# Patient Record
Sex: Female | Born: 1945
Health system: Southern US, Community
[De-identification: ages and names within clinical notes are randomized; demographics above are authoritative.]

## PROBLEM LIST (undated history)

## (undated) DIAGNOSIS — M1990 Unspecified osteoarthritis, unspecified site: Secondary | ICD-10-CM

## (undated) DIAGNOSIS — R011 Cardiac murmur, unspecified: Secondary | ICD-10-CM

## (undated) DIAGNOSIS — E039 Hypothyroidism, unspecified: Secondary | ICD-10-CM

## (undated) DIAGNOSIS — B019 Varicella without complication: Secondary | ICD-10-CM

## (undated) DIAGNOSIS — Z923 Personal history of irradiation: Secondary | ICD-10-CM

## (undated) DIAGNOSIS — E785 Hyperlipidemia, unspecified: Secondary | ICD-10-CM

## (undated) DIAGNOSIS — M8580 Other specified disorders of bone density and structure, unspecified site: Secondary | ICD-10-CM

## (undated) DIAGNOSIS — I10 Essential (primary) hypertension: Secondary | ICD-10-CM

## (undated) DIAGNOSIS — K5790 Diverticulosis of intestine, part unspecified, without perforation or abscess without bleeding: Secondary | ICD-10-CM

## (undated) DIAGNOSIS — R32 Unspecified urinary incontinence: Secondary | ICD-10-CM

## (undated) DIAGNOSIS — J309 Allergic rhinitis, unspecified: Secondary | ICD-10-CM

## (undated) DIAGNOSIS — N189 Chronic kidney disease, unspecified: Secondary | ICD-10-CM

## (undated) DIAGNOSIS — K219 Gastro-esophageal reflux disease without esophagitis: Secondary | ICD-10-CM

## (undated) DIAGNOSIS — E042 Nontoxic multinodular goiter: Secondary | ICD-10-CM

## (undated) DIAGNOSIS — E559 Vitamin D deficiency, unspecified: Secondary | ICD-10-CM

## (undated) DIAGNOSIS — K635 Polyp of colon: Secondary | ICD-10-CM

## (undated) DIAGNOSIS — F419 Anxiety disorder, unspecified: Secondary | ICD-10-CM

## (undated) DIAGNOSIS — N390 Urinary tract infection, site not specified: Secondary | ICD-10-CM

## (undated) DIAGNOSIS — C50919 Malignant neoplasm of unspecified site of unspecified female breast: Secondary | ICD-10-CM

## (undated) DIAGNOSIS — K921 Melena: Secondary | ICD-10-CM

## (undated) HISTORY — DX: Essential (primary) hypertension: I10

## (undated) HISTORY — DX: Cardiac murmur, unspecified: R01.1

## (undated) HISTORY — DX: Malignant neoplasm of unspecified site of unspecified female breast: C50.919

## (undated) HISTORY — DX: Hyperlipidemia, unspecified: E78.5

## (undated) HISTORY — DX: Unspecified osteoarthritis, unspecified site: M19.90

## (undated) HISTORY — DX: Varicella without complication: B01.9

## (undated) HISTORY — DX: Unspecified urinary incontinence: R32

## (undated) HISTORY — DX: Urinary tract infection, site not specified: N39.0

## (undated) HISTORY — DX: Polyp of colon: K63.5

## (undated) HISTORY — DX: Nontoxic multinodular goiter: E04.2

## (undated) HISTORY — PX: BLADDER SURGERY: SHX569

## (undated) HISTORY — DX: Chronic kidney disease, unspecified: N18.9

## (undated) HISTORY — DX: Allergic rhinitis, unspecified: J30.9

## (undated) HISTORY — DX: Gastro-esophageal reflux disease without esophagitis: K21.9

## (undated) HISTORY — DX: Melena: K92.1

## (undated) HISTORY — DX: Diverticulosis of intestine, part unspecified, without perforation or abscess without bleeding: K57.90

---

## 2001-08-15 HISTORY — PX: OTHER SURGICAL HISTORY: SHX169

## 2004-08-15 DIAGNOSIS — C50919 Malignant neoplasm of unspecified site of unspecified female breast: Secondary | ICD-10-CM

## 2004-08-15 HISTORY — PX: BREAST LUMPECTOMY: SHX2

## 2004-08-15 HISTORY — PX: BREAST EXCISIONAL BIOPSY: SUR124

## 2004-08-15 HISTORY — DX: Malignant neoplasm of unspecified site of unspecified female breast: C50.919

## 2007-04-02 ENCOUNTER — Emergency Department: Payer: Self-pay | Admitting: Emergency Medicine

## 2007-08-16 HISTORY — PX: OTHER SURGICAL HISTORY: SHX169

## 2009-09-22 ENCOUNTER — Emergency Department: Payer: Self-pay | Admitting: Emergency Medicine

## 2010-08-15 HISTORY — PX: DILATION AND CURETTAGE OF UTERUS: SHX78

## 2011-07-13 ENCOUNTER — Ambulatory Visit: Payer: Self-pay | Admitting: Family Medicine

## 2011-09-06 DIAGNOSIS — IMO0002 Reserved for concepts with insufficient information to code with codable children: Secondary | ICD-10-CM | POA: Diagnosis not present

## 2011-09-06 DIAGNOSIS — M25669 Stiffness of unspecified knee, not elsewhere classified: Secondary | ICD-10-CM | POA: Diagnosis not present

## 2011-09-06 DIAGNOSIS — M6580 Other synovitis and tenosynovitis, unspecified site: Secondary | ICD-10-CM | POA: Diagnosis not present

## 2011-09-06 DIAGNOSIS — M1710 Unilateral primary osteoarthritis, unspecified knee: Secondary | ICD-10-CM | POA: Diagnosis not present

## 2011-09-06 DIAGNOSIS — M25569 Pain in unspecified knee: Secondary | ICD-10-CM | POA: Diagnosis not present

## 2011-09-13 DIAGNOSIS — M25669 Stiffness of unspecified knee, not elsewhere classified: Secondary | ICD-10-CM | POA: Diagnosis not present

## 2011-09-13 DIAGNOSIS — M25569 Pain in unspecified knee: Secondary | ICD-10-CM | POA: Diagnosis not present

## 2011-09-13 DIAGNOSIS — M6281 Muscle weakness (generalized): Secondary | ICD-10-CM | POA: Diagnosis not present

## 2011-09-16 DIAGNOSIS — M6281 Muscle weakness (generalized): Secondary | ICD-10-CM | POA: Diagnosis not present

## 2011-09-16 DIAGNOSIS — M25569 Pain in unspecified knee: Secondary | ICD-10-CM | POA: Diagnosis not present

## 2011-09-16 DIAGNOSIS — M25669 Stiffness of unspecified knee, not elsewhere classified: Secondary | ICD-10-CM | POA: Diagnosis not present

## 2011-09-27 DIAGNOSIS — M6281 Muscle weakness (generalized): Secondary | ICD-10-CM | POA: Diagnosis not present

## 2011-09-27 DIAGNOSIS — M25669 Stiffness of unspecified knee, not elsewhere classified: Secondary | ICD-10-CM | POA: Diagnosis not present

## 2011-09-27 DIAGNOSIS — M25569 Pain in unspecified knee: Secondary | ICD-10-CM | POA: Diagnosis not present

## 2011-09-29 DIAGNOSIS — M25669 Stiffness of unspecified knee, not elsewhere classified: Secondary | ICD-10-CM | POA: Diagnosis not present

## 2011-09-29 DIAGNOSIS — M6281 Muscle weakness (generalized): Secondary | ICD-10-CM | POA: Diagnosis not present

## 2011-09-29 DIAGNOSIS — M25569 Pain in unspecified knee: Secondary | ICD-10-CM | POA: Diagnosis not present

## 2011-10-04 DIAGNOSIS — M25669 Stiffness of unspecified knee, not elsewhere classified: Secondary | ICD-10-CM | POA: Diagnosis not present

## 2011-10-04 DIAGNOSIS — M6281 Muscle weakness (generalized): Secondary | ICD-10-CM | POA: Diagnosis not present

## 2011-10-04 DIAGNOSIS — M25569 Pain in unspecified knee: Secondary | ICD-10-CM | POA: Diagnosis not present

## 2011-10-06 DIAGNOSIS — M25569 Pain in unspecified knee: Secondary | ICD-10-CM | POA: Diagnosis not present

## 2011-10-06 DIAGNOSIS — M25669 Stiffness of unspecified knee, not elsewhere classified: Secondary | ICD-10-CM | POA: Diagnosis not present

## 2011-10-06 DIAGNOSIS — M6281 Muscle weakness (generalized): Secondary | ICD-10-CM | POA: Diagnosis not present

## 2011-10-11 DIAGNOSIS — M1710 Unilateral primary osteoarthritis, unspecified knee: Secondary | ICD-10-CM | POA: Diagnosis not present

## 2011-10-11 DIAGNOSIS — M238X9 Other internal derangements of unspecified knee: Secondary | ICD-10-CM | POA: Diagnosis not present

## 2011-10-11 DIAGNOSIS — IMO0002 Reserved for concepts with insufficient information to code with codable children: Secondary | ICD-10-CM | POA: Diagnosis not present

## 2011-10-11 DIAGNOSIS — M25669 Stiffness of unspecified knee, not elsewhere classified: Secondary | ICD-10-CM | POA: Diagnosis not present

## 2011-10-11 DIAGNOSIS — M25569 Pain in unspecified knee: Secondary | ICD-10-CM | POA: Diagnosis not present

## 2011-11-22 DIAGNOSIS — Z853 Personal history of malignant neoplasm of breast: Secondary | ICD-10-CM | POA: Diagnosis not present

## 2011-11-22 DIAGNOSIS — M2550 Pain in unspecified joint: Secondary | ICD-10-CM | POA: Diagnosis not present

## 2011-11-22 DIAGNOSIS — E559 Vitamin D deficiency, unspecified: Secondary | ICD-10-CM | POA: Diagnosis not present

## 2011-11-22 DIAGNOSIS — R252 Cramp and spasm: Secondary | ICD-10-CM | POA: Diagnosis not present

## 2011-11-22 DIAGNOSIS — E785 Hyperlipidemia, unspecified: Secondary | ICD-10-CM | POA: Diagnosis not present

## 2011-11-22 DIAGNOSIS — Z8249 Family history of ischemic heart disease and other diseases of the circulatory system: Secondary | ICD-10-CM | POA: Diagnosis not present

## 2012-01-11 DIAGNOSIS — M79609 Pain in unspecified limb: Secondary | ICD-10-CM | POA: Diagnosis not present

## 2012-05-24 DIAGNOSIS — C50919 Malignant neoplasm of unspecified site of unspecified female breast: Secondary | ICD-10-CM | POA: Diagnosis not present

## 2012-05-24 DIAGNOSIS — E559 Vitamin D deficiency, unspecified: Secondary | ICD-10-CM | POA: Diagnosis not present

## 2012-05-24 DIAGNOSIS — R079 Chest pain, unspecified: Secondary | ICD-10-CM | POA: Diagnosis not present

## 2012-05-24 DIAGNOSIS — E0590 Thyrotoxicosis, unspecified without thyrotoxic crisis or storm: Secondary | ICD-10-CM | POA: Diagnosis not present

## 2012-05-24 DIAGNOSIS — R109 Unspecified abdominal pain: Secondary | ICD-10-CM | POA: Diagnosis not present

## 2012-05-25 DIAGNOSIS — R109 Unspecified abdominal pain: Secondary | ICD-10-CM | POA: Diagnosis not present

## 2012-05-25 DIAGNOSIS — E559 Vitamin D deficiency, unspecified: Secondary | ICD-10-CM | POA: Diagnosis not present

## 2012-05-25 DIAGNOSIS — R079 Chest pain, unspecified: Secondary | ICD-10-CM | POA: Diagnosis not present

## 2012-05-25 DIAGNOSIS — E785 Hyperlipidemia, unspecified: Secondary | ICD-10-CM | POA: Diagnosis not present

## 2012-05-28 ENCOUNTER — Ambulatory Visit: Payer: Self-pay | Admitting: Family Medicine

## 2012-05-28 DIAGNOSIS — R1013 Epigastric pain: Secondary | ICD-10-CM | POA: Diagnosis not present

## 2012-05-28 DIAGNOSIS — R002 Palpitations: Secondary | ICD-10-CM | POA: Diagnosis not present

## 2012-05-28 DIAGNOSIS — R109 Unspecified abdominal pain: Secondary | ICD-10-CM | POA: Diagnosis not present

## 2012-05-28 DIAGNOSIS — R0989 Other specified symptoms and signs involving the circulatory and respiratory systems: Secondary | ICD-10-CM | POA: Diagnosis not present

## 2012-05-28 DIAGNOSIS — R0789 Other chest pain: Secondary | ICD-10-CM | POA: Diagnosis not present

## 2012-05-28 DIAGNOSIS — R0609 Other forms of dyspnea: Secondary | ICD-10-CM | POA: Diagnosis not present

## 2012-05-28 DIAGNOSIS — I10 Essential (primary) hypertension: Secondary | ICD-10-CM | POA: Diagnosis not present

## 2012-06-04 DIAGNOSIS — M79609 Pain in unspecified limb: Secondary | ICD-10-CM | POA: Diagnosis not present

## 2012-06-04 DIAGNOSIS — B351 Tinea unguium: Secondary | ICD-10-CM | POA: Diagnosis not present

## 2012-06-05 DIAGNOSIS — R002 Palpitations: Secondary | ICD-10-CM | POA: Diagnosis not present

## 2012-06-05 DIAGNOSIS — R0989 Other specified symptoms and signs involving the circulatory and respiratory systems: Secondary | ICD-10-CM | POA: Diagnosis not present

## 2012-06-05 DIAGNOSIS — R0609 Other forms of dyspnea: Secondary | ICD-10-CM | POA: Diagnosis not present

## 2012-06-05 DIAGNOSIS — R0789 Other chest pain: Secondary | ICD-10-CM | POA: Diagnosis not present

## 2012-06-05 DIAGNOSIS — I10 Essential (primary) hypertension: Secondary | ICD-10-CM | POA: Diagnosis not present

## 2012-06-06 DIAGNOSIS — Z0000 Encounter for general adult medical examination without abnormal findings: Secondary | ICD-10-CM | POA: Diagnosis not present

## 2012-06-06 DIAGNOSIS — R6889 Other general symptoms and signs: Secondary | ICD-10-CM | POA: Diagnosis not present

## 2012-06-07 ENCOUNTER — Ambulatory Visit: Payer: Self-pay | Admitting: Hematology and Oncology

## 2012-06-07 DIAGNOSIS — Z9223 Personal history of estrogen therapy: Secondary | ICD-10-CM | POA: Diagnosis not present

## 2012-06-07 DIAGNOSIS — Z7982 Long term (current) use of aspirin: Secondary | ICD-10-CM | POA: Diagnosis not present

## 2012-06-07 DIAGNOSIS — G609 Hereditary and idiopathic neuropathy, unspecified: Secondary | ICD-10-CM | POA: Diagnosis not present

## 2012-06-07 DIAGNOSIS — Z79899 Other long term (current) drug therapy: Secondary | ICD-10-CM | POA: Diagnosis not present

## 2012-06-07 DIAGNOSIS — Z853 Personal history of malignant neoplasm of breast: Secondary | ICD-10-CM | POA: Diagnosis not present

## 2012-06-07 DIAGNOSIS — Z923 Personal history of irradiation: Secondary | ICD-10-CM | POA: Diagnosis not present

## 2012-06-08 DIAGNOSIS — R0602 Shortness of breath: Secondary | ICD-10-CM | POA: Diagnosis not present

## 2012-06-08 DIAGNOSIS — R0789 Other chest pain: Secondary | ICD-10-CM | POA: Diagnosis not present

## 2012-06-08 DIAGNOSIS — R011 Cardiac murmur, unspecified: Secondary | ICD-10-CM | POA: Diagnosis not present

## 2012-06-14 DIAGNOSIS — R0789 Other chest pain: Secondary | ICD-10-CM | POA: Diagnosis not present

## 2012-06-14 DIAGNOSIS — R0609 Other forms of dyspnea: Secondary | ICD-10-CM | POA: Diagnosis not present

## 2012-06-14 DIAGNOSIS — R0989 Other specified symptoms and signs involving the circulatory and respiratory systems: Secondary | ICD-10-CM | POA: Diagnosis not present

## 2012-06-14 DIAGNOSIS — I10 Essential (primary) hypertension: Secondary | ICD-10-CM | POA: Diagnosis not present

## 2012-06-14 DIAGNOSIS — R002 Palpitations: Secondary | ICD-10-CM | POA: Diagnosis not present

## 2012-06-15 ENCOUNTER — Ambulatory Visit: Payer: Self-pay | Admitting: Hematology and Oncology

## 2012-06-18 ENCOUNTER — Ambulatory Visit: Payer: Self-pay | Admitting: Hematology and Oncology

## 2012-06-18 DIAGNOSIS — Z853 Personal history of malignant neoplasm of breast: Secondary | ICD-10-CM | POA: Diagnosis not present

## 2012-07-06 ENCOUNTER — Ambulatory Visit: Payer: Self-pay | Admitting: Family Medicine

## 2012-07-06 DIAGNOSIS — R109 Unspecified abdominal pain: Secondary | ICD-10-CM | POA: Diagnosis not present

## 2012-07-06 DIAGNOSIS — R079 Chest pain, unspecified: Secondary | ICD-10-CM | POA: Diagnosis not present

## 2012-07-06 DIAGNOSIS — R937 Abnormal findings on diagnostic imaging of other parts of musculoskeletal system: Secondary | ICD-10-CM | POA: Diagnosis not present

## 2012-07-06 DIAGNOSIS — M25579 Pain in unspecified ankle and joints of unspecified foot: Secondary | ICD-10-CM | POA: Diagnosis not present

## 2012-07-06 DIAGNOSIS — I10 Essential (primary) hypertension: Secondary | ICD-10-CM | POA: Diagnosis not present

## 2012-07-09 DIAGNOSIS — I10 Essential (primary) hypertension: Secondary | ICD-10-CM | POA: Diagnosis not present

## 2012-07-09 DIAGNOSIS — R0789 Other chest pain: Secondary | ICD-10-CM | POA: Diagnosis not present

## 2012-07-30 DIAGNOSIS — S92009A Unspecified fracture of unspecified calcaneus, initial encounter for closed fracture: Secondary | ICD-10-CM | POA: Diagnosis not present

## 2012-08-27 DIAGNOSIS — S92009A Unspecified fracture of unspecified calcaneus, initial encounter for closed fracture: Secondary | ICD-10-CM | POA: Diagnosis not present

## 2012-09-04 DIAGNOSIS — R1013 Epigastric pain: Secondary | ICD-10-CM | POA: Diagnosis not present

## 2012-09-04 DIAGNOSIS — K3189 Other diseases of stomach and duodenum: Secondary | ICD-10-CM | POA: Diagnosis not present

## 2012-09-05 DIAGNOSIS — R1013 Epigastric pain: Secondary | ICD-10-CM | POA: Diagnosis not present

## 2012-11-26 ENCOUNTER — Ambulatory Visit: Payer: Self-pay | Admitting: Hematology and Oncology

## 2012-11-26 DIAGNOSIS — Z79899 Other long term (current) drug therapy: Secondary | ICD-10-CM | POA: Diagnosis not present

## 2012-11-26 DIAGNOSIS — Z87891 Personal history of nicotine dependence: Secondary | ICD-10-CM | POA: Diagnosis not present

## 2012-11-26 DIAGNOSIS — R252 Cramp and spasm: Secondary | ICD-10-CM | POA: Diagnosis not present

## 2012-11-26 DIAGNOSIS — Z170 Estrogen receptor positive status [ER+]: Secondary | ICD-10-CM | POA: Diagnosis not present

## 2012-11-26 DIAGNOSIS — Z7982 Long term (current) use of aspirin: Secondary | ICD-10-CM | POA: Diagnosis not present

## 2012-11-26 DIAGNOSIS — Z853 Personal history of malignant neoplasm of breast: Secondary | ICD-10-CM | POA: Diagnosis not present

## 2012-11-26 DIAGNOSIS — Z9223 Personal history of estrogen therapy: Secondary | ICD-10-CM | POA: Diagnosis not present

## 2012-11-26 DIAGNOSIS — M810 Age-related osteoporosis without current pathological fracture: Secondary | ICD-10-CM | POA: Diagnosis not present

## 2012-11-26 DIAGNOSIS — E0590 Thyrotoxicosis, unspecified without thyrotoxic crisis or storm: Secondary | ICD-10-CM | POA: Diagnosis not present

## 2012-11-26 DIAGNOSIS — Z13820 Encounter for screening for osteoporosis: Secondary | ICD-10-CM | POA: Diagnosis not present

## 2012-11-26 DIAGNOSIS — Z923 Personal history of irradiation: Secondary | ICD-10-CM | POA: Diagnosis not present

## 2012-11-26 DIAGNOSIS — E559 Vitamin D deficiency, unspecified: Secondary | ICD-10-CM | POA: Diagnosis not present

## 2012-11-28 DIAGNOSIS — Z923 Personal history of irradiation: Secondary | ICD-10-CM | POA: Diagnosis not present

## 2012-11-28 DIAGNOSIS — Z853 Personal history of malignant neoplasm of breast: Secondary | ICD-10-CM | POA: Diagnosis not present

## 2012-11-28 DIAGNOSIS — E559 Vitamin D deficiency, unspecified: Secondary | ICD-10-CM | POA: Diagnosis not present

## 2012-11-28 DIAGNOSIS — Z9223 Personal history of estrogen therapy: Secondary | ICD-10-CM | POA: Diagnosis not present

## 2012-11-28 LAB — CBC CANCER CENTER
Basophil %: 0.2 %
Eosinophil #: 0.1 x10 3/mm (ref 0.0–0.7)
Lymphocyte #: 1.1 x10 3/mm (ref 1.0–3.6)
Lymphocyte %: 31.6 %
MCH: 27.1 pg (ref 26.0–34.0)
Monocyte %: 13.9 %
Neutrophil #: 1.9 x10 3/mm (ref 1.4–6.5)
RBC: 4.68 10*6/uL (ref 3.80–5.20)
RDW: 15.1 % — ABNORMAL HIGH (ref 11.5–14.5)

## 2012-11-28 LAB — COMPREHENSIVE METABOLIC PANEL
Alkaline Phosphatase: 85 U/L (ref 50–136)
BUN: 15 mg/dL (ref 7–18)
Calcium, Total: 9.5 mg/dL (ref 8.5–10.1)
Co2: 32 mmol/L (ref 21–32)
Creatinine: 1.36 mg/dL — ABNORMAL HIGH (ref 0.60–1.30)
EGFR (African American): 46 — ABNORMAL LOW
EGFR (Non-African Amer.): 40 — ABNORMAL LOW
Glucose: 102 mg/dL — ABNORMAL HIGH (ref 65–99)
Potassium: 4.4 mmol/L (ref 3.5–5.1)
SGPT (ALT): 25 U/L (ref 12–78)
Total Protein: 7.7 g/dL (ref 6.4–8.2)

## 2012-11-29 DIAGNOSIS — E785 Hyperlipidemia, unspecified: Secondary | ICD-10-CM | POA: Diagnosis not present

## 2012-11-29 DIAGNOSIS — R5381 Other malaise: Secondary | ICD-10-CM | POA: Diagnosis not present

## 2012-11-29 DIAGNOSIS — E559 Vitamin D deficiency, unspecified: Secondary | ICD-10-CM | POA: Diagnosis not present

## 2012-12-03 DIAGNOSIS — I10 Essential (primary) hypertension: Secondary | ICD-10-CM | POA: Diagnosis not present

## 2012-12-03 DIAGNOSIS — E559 Vitamin D deficiency, unspecified: Secondary | ICD-10-CM | POA: Diagnosis not present

## 2012-12-10 DIAGNOSIS — H6120 Impacted cerumen, unspecified ear: Secondary | ICD-10-CM | POA: Diagnosis not present

## 2012-12-10 DIAGNOSIS — H60509 Unspecified acute noninfective otitis externa, unspecified ear: Secondary | ICD-10-CM | POA: Diagnosis not present

## 2012-12-11 DIAGNOSIS — M810 Age-related osteoporosis without current pathological fracture: Secondary | ICD-10-CM | POA: Diagnosis not present

## 2012-12-12 DIAGNOSIS — R0789 Other chest pain: Secondary | ICD-10-CM | POA: Diagnosis not present

## 2012-12-12 DIAGNOSIS — I10 Essential (primary) hypertension: Secondary | ICD-10-CM | POA: Diagnosis not present

## 2012-12-12 DIAGNOSIS — R0609 Other forms of dyspnea: Secondary | ICD-10-CM | POA: Diagnosis not present

## 2012-12-13 ENCOUNTER — Ambulatory Visit: Payer: Self-pay | Admitting: Hematology and Oncology

## 2012-12-13 DIAGNOSIS — Z87891 Personal history of nicotine dependence: Secondary | ICD-10-CM | POA: Diagnosis not present

## 2012-12-13 DIAGNOSIS — Z9223 Personal history of estrogen therapy: Secondary | ICD-10-CM | POA: Diagnosis not present

## 2012-12-13 DIAGNOSIS — E559 Vitamin D deficiency, unspecified: Secondary | ICD-10-CM | POA: Diagnosis not present

## 2012-12-13 DIAGNOSIS — Z170 Estrogen receptor positive status [ER+]: Secondary | ICD-10-CM | POA: Diagnosis not present

## 2012-12-13 DIAGNOSIS — Z7982 Long term (current) use of aspirin: Secondary | ICD-10-CM | POA: Diagnosis not present

## 2012-12-13 DIAGNOSIS — M810 Age-related osteoporosis without current pathological fracture: Secondary | ICD-10-CM | POA: Diagnosis not present

## 2012-12-13 DIAGNOSIS — Z853 Personal history of malignant neoplasm of breast: Secondary | ICD-10-CM | POA: Diagnosis not present

## 2012-12-13 DIAGNOSIS — F329 Major depressive disorder, single episode, unspecified: Secondary | ICD-10-CM | POA: Diagnosis not present

## 2012-12-13 DIAGNOSIS — Z862 Personal history of diseases of the blood and blood-forming organs and certain disorders involving the immune mechanism: Secondary | ICD-10-CM | POA: Diagnosis not present

## 2012-12-13 DIAGNOSIS — I10 Essential (primary) hypertension: Secondary | ICD-10-CM | POA: Diagnosis not present

## 2012-12-13 DIAGNOSIS — Z79899 Other long term (current) drug therapy: Secondary | ICD-10-CM | POA: Diagnosis not present

## 2012-12-13 DIAGNOSIS — Z923 Personal history of irradiation: Secondary | ICD-10-CM | POA: Diagnosis not present

## 2012-12-13 DIAGNOSIS — E785 Hyperlipidemia, unspecified: Secondary | ICD-10-CM | POA: Diagnosis not present

## 2012-12-14 DIAGNOSIS — Z170 Estrogen receptor positive status [ER+]: Secondary | ICD-10-CM | POA: Diagnosis not present

## 2012-12-14 DIAGNOSIS — Z853 Personal history of malignant neoplasm of breast: Secondary | ICD-10-CM | POA: Diagnosis not present

## 2012-12-14 DIAGNOSIS — M810 Age-related osteoporosis without current pathological fracture: Secondary | ICD-10-CM | POA: Diagnosis not present

## 2012-12-14 DIAGNOSIS — Z9223 Personal history of estrogen therapy: Secondary | ICD-10-CM | POA: Diagnosis not present

## 2013-01-13 ENCOUNTER — Ambulatory Visit: Payer: Self-pay | Admitting: Hematology and Oncology

## 2013-01-28 DIAGNOSIS — I10 Essential (primary) hypertension: Secondary | ICD-10-CM | POA: Diagnosis not present

## 2013-01-28 DIAGNOSIS — E559 Vitamin D deficiency, unspecified: Secondary | ICD-10-CM | POA: Diagnosis not present

## 2013-03-28 DIAGNOSIS — I10 Essential (primary) hypertension: Secondary | ICD-10-CM | POA: Diagnosis not present

## 2013-03-28 DIAGNOSIS — E785 Hyperlipidemia, unspecified: Secondary | ICD-10-CM | POA: Diagnosis not present

## 2013-03-28 DIAGNOSIS — J309 Allergic rhinitis, unspecified: Secondary | ICD-10-CM | POA: Diagnosis not present

## 2013-03-28 DIAGNOSIS — E559 Vitamin D deficiency, unspecified: Secondary | ICD-10-CM | POA: Diagnosis not present

## 2013-04-04 DIAGNOSIS — L821 Other seborrheic keratosis: Secondary | ICD-10-CM | POA: Diagnosis not present

## 2013-04-04 DIAGNOSIS — D485 Neoplasm of uncertain behavior of skin: Secondary | ICD-10-CM | POA: Diagnosis not present

## 2013-04-04 DIAGNOSIS — L723 Sebaceous cyst: Secondary | ICD-10-CM | POA: Diagnosis not present

## 2013-04-04 DIAGNOSIS — D2360 Other benign neoplasm of skin of unspecified upper limb, including shoulder: Secondary | ICD-10-CM | POA: Diagnosis not present

## 2013-04-04 DIAGNOSIS — R21 Rash and other nonspecific skin eruption: Secondary | ICD-10-CM | POA: Diagnosis not present

## 2013-05-09 DIAGNOSIS — I10 Essential (primary) hypertension: Secondary | ICD-10-CM | POA: Diagnosis not present

## 2013-05-21 DIAGNOSIS — E559 Vitamin D deficiency, unspecified: Secondary | ICD-10-CM | POA: Diagnosis not present

## 2013-05-21 DIAGNOSIS — I10 Essential (primary) hypertension: Secondary | ICD-10-CM | POA: Diagnosis not present

## 2013-05-21 DIAGNOSIS — N182 Chronic kidney disease, stage 2 (mild): Secondary | ICD-10-CM | POA: Diagnosis not present

## 2013-05-30 DIAGNOSIS — R0609 Other forms of dyspnea: Secondary | ICD-10-CM | POA: Diagnosis not present

## 2013-05-30 DIAGNOSIS — R002 Palpitations: Secondary | ICD-10-CM | POA: Diagnosis not present

## 2013-05-30 DIAGNOSIS — G4730 Sleep apnea, unspecified: Secondary | ICD-10-CM | POA: Diagnosis not present

## 2013-06-10 DIAGNOSIS — Z113 Encounter for screening for infections with a predominantly sexual mode of transmission: Secondary | ICD-10-CM | POA: Diagnosis not present

## 2013-06-10 DIAGNOSIS — N949 Unspecified condition associated with female genital organs and menstrual cycle: Secondary | ICD-10-CM | POA: Diagnosis not present

## 2013-06-10 DIAGNOSIS — Z01419 Encounter for gynecological examination (general) (routine) without abnormal findings: Secondary | ICD-10-CM | POA: Diagnosis not present

## 2013-06-10 DIAGNOSIS — Z9189 Other specified personal risk factors, not elsewhere classified: Secondary | ICD-10-CM | POA: Diagnosis not present

## 2013-07-01 ENCOUNTER — Ambulatory Visit: Payer: Self-pay | Admitting: Hematology and Oncology

## 2013-07-01 DIAGNOSIS — Z853 Personal history of malignant neoplasm of breast: Secondary | ICD-10-CM | POA: Diagnosis not present

## 2013-07-01 DIAGNOSIS — Z1231 Encounter for screening mammogram for malignant neoplasm of breast: Secondary | ICD-10-CM | POA: Diagnosis not present

## 2013-07-02 DIAGNOSIS — E559 Vitamin D deficiency, unspecified: Secondary | ICD-10-CM | POA: Diagnosis not present

## 2013-07-04 ENCOUNTER — Ambulatory Visit: Payer: Self-pay | Admitting: Hematology and Oncology

## 2013-07-04 DIAGNOSIS — Z170 Estrogen receptor positive status [ER+]: Secondary | ICD-10-CM | POA: Diagnosis not present

## 2013-07-04 DIAGNOSIS — Z853 Personal history of malignant neoplasm of breast: Secondary | ICD-10-CM | POA: Diagnosis not present

## 2013-07-04 DIAGNOSIS — M810 Age-related osteoporosis without current pathological fracture: Secondary | ICD-10-CM | POA: Diagnosis not present

## 2013-07-04 DIAGNOSIS — Z9223 Personal history of estrogen therapy: Secondary | ICD-10-CM | POA: Diagnosis not present

## 2013-07-04 DIAGNOSIS — Z923 Personal history of irradiation: Secondary | ICD-10-CM | POA: Diagnosis not present

## 2013-07-04 DIAGNOSIS — E039 Hypothyroidism, unspecified: Secondary | ICD-10-CM | POA: Diagnosis not present

## 2013-07-04 DIAGNOSIS — Z79899 Other long term (current) drug therapy: Secondary | ICD-10-CM | POA: Diagnosis not present

## 2013-07-04 LAB — COMPREHENSIVE METABOLIC PANEL
Alkaline Phosphatase: 84 U/L (ref 50–136)
BUN: 15 mg/dL (ref 7–18)
Bilirubin,Total: 0.5 mg/dL (ref 0.2–1.0)
Chloride: 104 mmol/L (ref 98–107)
Creatinine: 1.29 mg/dL (ref 0.60–1.30)
EGFR (African American): 49 — ABNORMAL LOW
Glucose: 87 mg/dL (ref 65–99)
SGPT (ALT): 26 U/L (ref 12–78)
Sodium: 141 mmol/L (ref 136–145)

## 2013-07-04 LAB — CBC CANCER CENTER
Basophil #: 0 x10 3/mm (ref 0.0–0.1)
Basophil %: 1.2 %
Eosinophil #: 0.1 x10 3/mm (ref 0.0–0.7)
HCT: 39.9 % (ref 35.0–47.0)
HGB: 12.6 g/dL (ref 12.0–16.0)
Lymphocyte %: 41.4 %
MCH: 27 pg (ref 26.0–34.0)
MCHC: 31.7 g/dL — ABNORMAL LOW (ref 32.0–36.0)
MCV: 85 fL (ref 80–100)
Monocyte #: 0.4 x10 3/mm (ref 0.2–0.9)
Monocyte %: 13.7 %
Neutrophil #: 1.3 x10 3/mm — ABNORMAL LOW (ref 1.4–6.5)
Neutrophil %: 41.2 %
Platelet: 181 x10 3/mm (ref 150–440)
RDW: 15.2 % — ABNORMAL HIGH (ref 11.5–14.5)

## 2013-07-15 ENCOUNTER — Ambulatory Visit: Payer: Self-pay | Admitting: Hematology and Oncology

## 2013-11-19 DIAGNOSIS — N183 Chronic kidney disease, stage 3 unspecified: Secondary | ICD-10-CM | POA: Diagnosis not present

## 2013-11-19 DIAGNOSIS — E559 Vitamin D deficiency, unspecified: Secondary | ICD-10-CM | POA: Diagnosis not present

## 2013-11-19 DIAGNOSIS — I10 Essential (primary) hypertension: Secondary | ICD-10-CM | POA: Diagnosis not present

## 2013-11-26 DIAGNOSIS — R0609 Other forms of dyspnea: Secondary | ICD-10-CM | POA: Diagnosis not present

## 2013-11-26 DIAGNOSIS — I10 Essential (primary) hypertension: Secondary | ICD-10-CM | POA: Diagnosis not present

## 2014-01-13 DIAGNOSIS — I10 Essential (primary) hypertension: Secondary | ICD-10-CM | POA: Diagnosis not present

## 2014-01-13 DIAGNOSIS — J309 Allergic rhinitis, unspecified: Secondary | ICD-10-CM | POA: Diagnosis not present

## 2014-01-13 DIAGNOSIS — Z1331 Encounter for screening for depression: Secondary | ICD-10-CM | POA: Diagnosis not present

## 2014-01-13 DIAGNOSIS — E559 Vitamin D deficiency, unspecified: Secondary | ICD-10-CM | POA: Diagnosis not present

## 2014-03-14 DIAGNOSIS — H1045 Other chronic allergic conjunctivitis: Secondary | ICD-10-CM | POA: Diagnosis not present

## 2014-04-08 DIAGNOSIS — M1710 Unilateral primary osteoarthritis, unspecified knee: Secondary | ICD-10-CM | POA: Diagnosis not present

## 2014-04-08 DIAGNOSIS — M2390 Unspecified internal derangement of unspecified knee: Secondary | ICD-10-CM | POA: Diagnosis not present

## 2014-04-17 ENCOUNTER — Ambulatory Visit: Payer: Self-pay | Admitting: Unknown Physician Specialty

## 2014-04-17 DIAGNOSIS — IMO0002 Reserved for concepts with insufficient information to code with codable children: Secondary | ICD-10-CM | POA: Diagnosis not present

## 2014-04-17 DIAGNOSIS — M1710 Unilateral primary osteoarthritis, unspecified knee: Secondary | ICD-10-CM | POA: Diagnosis not present

## 2014-04-17 DIAGNOSIS — M23329 Other meniscus derangements, posterior horn of medial meniscus, unspecified knee: Secondary | ICD-10-CM | POA: Diagnosis not present

## 2014-04-30 DIAGNOSIS — M1710 Unilateral primary osteoarthritis, unspecified knee: Secondary | ICD-10-CM | POA: Diagnosis not present

## 2014-04-30 DIAGNOSIS — M25569 Pain in unspecified knee: Secondary | ICD-10-CM | POA: Diagnosis not present

## 2014-06-12 DIAGNOSIS — M1712 Unilateral primary osteoarthritis, left knee: Secondary | ICD-10-CM | POA: Diagnosis not present

## 2014-06-19 DIAGNOSIS — E559 Vitamin D deficiency, unspecified: Secondary | ICD-10-CM | POA: Diagnosis not present

## 2014-06-19 DIAGNOSIS — N183 Chronic kidney disease, stage 3 (moderate): Secondary | ICD-10-CM | POA: Diagnosis not present

## 2014-06-19 DIAGNOSIS — E509 Vitamin A deficiency, unspecified: Secondary | ICD-10-CM | POA: Diagnosis not present

## 2014-06-19 DIAGNOSIS — I10 Essential (primary) hypertension: Secondary | ICD-10-CM | POA: Diagnosis not present

## 2014-07-04 ENCOUNTER — Ambulatory Visit: Payer: Self-pay | Admitting: Hematology and Oncology

## 2014-07-04 DIAGNOSIS — M810 Age-related osteoporosis without current pathological fracture: Secondary | ICD-10-CM | POA: Diagnosis not present

## 2014-07-04 DIAGNOSIS — Z9223 Personal history of estrogen therapy: Secondary | ICD-10-CM | POA: Diagnosis not present

## 2014-07-04 DIAGNOSIS — Z86000 Personal history of in-situ neoplasm of breast: Secondary | ICD-10-CM | POA: Diagnosis not present

## 2014-07-04 DIAGNOSIS — Z79899 Other long term (current) drug therapy: Secondary | ICD-10-CM | POA: Diagnosis not present

## 2014-07-04 DIAGNOSIS — Z923 Personal history of irradiation: Secondary | ICD-10-CM | POA: Diagnosis not present

## 2014-07-04 DIAGNOSIS — M25562 Pain in left knee: Secondary | ICD-10-CM | POA: Diagnosis not present

## 2014-07-04 DIAGNOSIS — Z87891 Personal history of nicotine dependence: Secondary | ICD-10-CM | POA: Diagnosis not present

## 2014-07-04 DIAGNOSIS — E0590 Thyrotoxicosis, unspecified without thyrotoxic crisis or storm: Secondary | ICD-10-CM | POA: Diagnosis not present

## 2014-07-04 LAB — COMPREHENSIVE METABOLIC PANEL
ANION GAP: 4 — AB (ref 7–16)
Albumin: 3.7 g/dL (ref 3.4–5.0)
Alkaline Phosphatase: 65 U/L
BILIRUBIN TOTAL: 0.4 mg/dL (ref 0.2–1.0)
BUN: 12 mg/dL (ref 7–18)
Calcium, Total: 8.8 mg/dL (ref 8.5–10.1)
Chloride: 108 mmol/L — ABNORMAL HIGH (ref 98–107)
Co2: 30 mmol/L (ref 21–32)
Creatinine: 1.18 mg/dL (ref 0.60–1.30)
EGFR (African American): 58 — ABNORMAL LOW
GFR CALC NON AF AMER: 48 — AB
Glucose: 86 mg/dL (ref 65–99)
Osmolality: 282 (ref 275–301)
POTASSIUM: 3.9 mmol/L (ref 3.5–5.1)
SGOT(AST): 24 U/L (ref 15–37)
SGPT (ALT): 25 U/L
Sodium: 142 mmol/L (ref 136–145)
Total Protein: 7.3 g/dL (ref 6.4–8.2)

## 2014-07-04 LAB — CBC CANCER CENTER
BASOS PCT: 1.2 %
Basophil #: 0 x10 3/mm (ref 0.0–0.1)
Eosinophil #: 0.1 x10 3/mm (ref 0.0–0.7)
Eosinophil %: 2.6 %
HCT: 40.6 % (ref 35.0–47.0)
HGB: 12.9 g/dL (ref 12.0–16.0)
Lymphocyte #: 1.2 x10 3/mm (ref 1.0–3.6)
Lymphocyte %: 40 %
MCH: 27.7 pg (ref 26.0–34.0)
MCHC: 31.7 g/dL — ABNORMAL LOW (ref 32.0–36.0)
MCV: 88 fL (ref 80–100)
MONO ABS: 0.4 x10 3/mm (ref 0.2–0.9)
Monocyte %: 13.8 %
NEUTROS PCT: 42.4 %
Neutrophil #: 1.3 x10 3/mm — ABNORMAL LOW (ref 1.4–6.5)
PLATELETS: 184 x10 3/mm (ref 150–440)
RBC: 4.64 10*6/uL (ref 3.80–5.20)
RDW: 14.6 % — ABNORMAL HIGH (ref 11.5–14.5)
WBC: 3 x10 3/mm — ABNORMAL LOW (ref 3.6–11.0)

## 2014-07-07 DIAGNOSIS — R0789 Other chest pain: Secondary | ICD-10-CM | POA: Insufficient documentation

## 2014-07-07 DIAGNOSIS — N289 Disorder of kidney and ureter, unspecified: Secondary | ICD-10-CM | POA: Insufficient documentation

## 2014-07-07 DIAGNOSIS — R002 Palpitations: Secondary | ICD-10-CM | POA: Insufficient documentation

## 2014-07-07 DIAGNOSIS — R079 Chest pain, unspecified: Secondary | ICD-10-CM | POA: Insufficient documentation

## 2014-07-07 LAB — CANCER ANTIGEN 27.29: CA 27.29: 37.9 U/mL (ref 0.0–38.6)

## 2014-07-08 DIAGNOSIS — R079 Chest pain, unspecified: Secondary | ICD-10-CM | POA: Diagnosis not present

## 2014-07-08 DIAGNOSIS — R002 Palpitations: Secondary | ICD-10-CM | POA: Diagnosis not present

## 2014-07-08 DIAGNOSIS — R0602 Shortness of breath: Secondary | ICD-10-CM | POA: Diagnosis not present

## 2014-07-08 DIAGNOSIS — I10 Essential (primary) hypertension: Secondary | ICD-10-CM | POA: Diagnosis not present

## 2014-07-15 ENCOUNTER — Ambulatory Visit: Payer: Self-pay | Admitting: Hematology and Oncology

## 2014-07-15 DIAGNOSIS — Z13820 Encounter for screening for osteoporosis: Secondary | ICD-10-CM | POA: Diagnosis not present

## 2014-07-15 DIAGNOSIS — Z923 Personal history of irradiation: Secondary | ICD-10-CM | POA: Diagnosis not present

## 2014-07-15 DIAGNOSIS — Z9223 Personal history of estrogen therapy: Secondary | ICD-10-CM | POA: Diagnosis not present

## 2014-07-15 DIAGNOSIS — Z86000 Personal history of in-situ neoplasm of breast: Secondary | ICD-10-CM | POA: Diagnosis not present

## 2014-07-15 DIAGNOSIS — M810 Age-related osteoporosis without current pathological fracture: Secondary | ICD-10-CM | POA: Diagnosis not present

## 2014-07-15 DIAGNOSIS — E559 Vitamin D deficiency, unspecified: Secondary | ICD-10-CM | POA: Diagnosis not present

## 2014-07-15 DIAGNOSIS — I10 Essential (primary) hypertension: Secondary | ICD-10-CM | POA: Diagnosis not present

## 2014-07-15 DIAGNOSIS — E784 Other hyperlipidemia: Secondary | ICD-10-CM | POA: Diagnosis not present

## 2014-07-15 DIAGNOSIS — Z1231 Encounter for screening mammogram for malignant neoplasm of breast: Secondary | ICD-10-CM | POA: Diagnosis not present

## 2014-07-17 DIAGNOSIS — M810 Age-related osteoporosis without current pathological fracture: Secondary | ICD-10-CM | POA: Diagnosis not present

## 2014-07-17 DIAGNOSIS — Z780 Asymptomatic menopausal state: Secondary | ICD-10-CM | POA: Diagnosis not present

## 2014-07-17 DIAGNOSIS — Z1231 Encounter for screening mammogram for malignant neoplasm of breast: Secondary | ICD-10-CM | POA: Diagnosis not present

## 2014-07-17 DIAGNOSIS — M1712 Unilateral primary osteoarthritis, left knee: Secondary | ICD-10-CM | POA: Diagnosis not present

## 2014-07-17 DIAGNOSIS — C50919 Malignant neoplasm of unspecified site of unspecified female breast: Secondary | ICD-10-CM | POA: Diagnosis not present

## 2014-07-29 DIAGNOSIS — C50919 Malignant neoplasm of unspecified site of unspecified female breast: Secondary | ICD-10-CM | POA: Diagnosis not present

## 2014-07-29 DIAGNOSIS — N9089 Other specified noninflammatory disorders of vulva and perineum: Secondary | ICD-10-CM | POA: Diagnosis not present

## 2014-07-29 DIAGNOSIS — M8580 Other specified disorders of bone density and structure, unspecified site: Secondary | ICD-10-CM | POA: Diagnosis not present

## 2014-07-29 DIAGNOSIS — Z01419 Encounter for gynecological examination (general) (routine) without abnormal findings: Secondary | ICD-10-CM | POA: Diagnosis not present

## 2014-07-29 DIAGNOSIS — M810 Age-related osteoporosis without current pathological fracture: Secondary | ICD-10-CM | POA: Diagnosis not present

## 2014-08-15 ENCOUNTER — Ambulatory Visit: Payer: Self-pay | Admitting: Hematology and Oncology

## 2014-09-15 ENCOUNTER — Ambulatory Visit: Payer: Self-pay | Admitting: Hematology and Oncology

## 2014-09-18 DIAGNOSIS — I10 Essential (primary) hypertension: Secondary | ICD-10-CM | POA: Diagnosis not present

## 2014-09-18 DIAGNOSIS — M79602 Pain in left arm: Secondary | ICD-10-CM | POA: Diagnosis not present

## 2014-09-18 DIAGNOSIS — E559 Vitamin D deficiency, unspecified: Secondary | ICD-10-CM | POA: Diagnosis not present

## 2014-09-30 ENCOUNTER — Ambulatory Visit: Payer: Self-pay | Admitting: Hematology and Oncology

## 2014-11-28 ENCOUNTER — Other Ambulatory Visit: Payer: Self-pay | Admitting: Hematology and Oncology

## 2014-11-28 DIAGNOSIS — M810 Age-related osteoporosis without current pathological fracture: Secondary | ICD-10-CM

## 2014-12-18 DIAGNOSIS — E559 Vitamin D deficiency, unspecified: Secondary | ICD-10-CM | POA: Diagnosis not present

## 2014-12-18 DIAGNOSIS — I10 Essential (primary) hypertension: Secondary | ICD-10-CM | POA: Diagnosis not present

## 2014-12-18 DIAGNOSIS — N183 Chronic kidney disease, stage 3 (moderate): Secondary | ICD-10-CM | POA: Diagnosis not present

## 2015-01-29 DIAGNOSIS — H60549 Acute eczematoid otitis externa, unspecified ear: Secondary | ICD-10-CM | POA: Diagnosis not present

## 2015-01-29 DIAGNOSIS — H6123 Impacted cerumen, bilateral: Secondary | ICD-10-CM | POA: Diagnosis not present

## 2015-05-26 DIAGNOSIS — M79671 Pain in right foot: Secondary | ICD-10-CM | POA: Diagnosis not present

## 2015-05-26 DIAGNOSIS — B351 Tinea unguium: Secondary | ICD-10-CM | POA: Diagnosis not present

## 2015-05-26 DIAGNOSIS — L600 Ingrowing nail: Secondary | ICD-10-CM | POA: Diagnosis not present

## 2015-05-26 DIAGNOSIS — M722 Plantar fascial fibromatosis: Secondary | ICD-10-CM | POA: Diagnosis not present

## 2015-06-18 DIAGNOSIS — N183 Chronic kidney disease, stage 3 (moderate): Secondary | ICD-10-CM | POA: Diagnosis not present

## 2015-06-18 DIAGNOSIS — E559 Vitamin D deficiency, unspecified: Secondary | ICD-10-CM | POA: Diagnosis not present

## 2015-06-18 DIAGNOSIS — N2581 Secondary hyperparathyroidism of renal origin: Secondary | ICD-10-CM | POA: Diagnosis not present

## 2015-06-22 DIAGNOSIS — N289 Disorder of kidney and ureter, unspecified: Secondary | ICD-10-CM | POA: Diagnosis not present

## 2015-06-22 DIAGNOSIS — R0602 Shortness of breath: Secondary | ICD-10-CM | POA: Diagnosis not present

## 2015-06-22 DIAGNOSIS — R079 Chest pain, unspecified: Secondary | ICD-10-CM | POA: Diagnosis not present

## 2015-06-22 DIAGNOSIS — R002 Palpitations: Secondary | ICD-10-CM | POA: Diagnosis not present

## 2015-06-22 DIAGNOSIS — R0789 Other chest pain: Secondary | ICD-10-CM | POA: Diagnosis not present

## 2015-06-22 DIAGNOSIS — I10 Essential (primary) hypertension: Secondary | ICD-10-CM | POA: Diagnosis not present

## 2015-06-30 ENCOUNTER — Ambulatory Visit (INDEPENDENT_AMBULATORY_CARE_PROVIDER_SITE_OTHER): Payer: Medicare Other | Admitting: Family Medicine

## 2015-06-30 ENCOUNTER — Encounter: Payer: Self-pay | Admitting: Family Medicine

## 2015-06-30 VITALS — BP 124/76 | HR 69 | Temp 98.6°F | Ht 67.72 in | Wt 185.4 lb

## 2015-06-30 DIAGNOSIS — E669 Obesity, unspecified: Secondary | ICD-10-CM

## 2015-06-30 DIAGNOSIS — R079 Chest pain, unspecified: Secondary | ICD-10-CM

## 2015-06-30 DIAGNOSIS — E663 Overweight: Secondary | ICD-10-CM | POA: Diagnosis not present

## 2015-06-30 DIAGNOSIS — Z1329 Encounter for screening for other suspected endocrine disorder: Secondary | ICD-10-CM

## 2015-06-30 DIAGNOSIS — K921 Melena: Secondary | ICD-10-CM | POA: Insufficient documentation

## 2015-06-30 DIAGNOSIS — E559 Vitamin D deficiency, unspecified: Secondary | ICD-10-CM

## 2015-06-30 DIAGNOSIS — Z13220 Encounter for screening for lipoid disorders: Secondary | ICD-10-CM | POA: Diagnosis not present

## 2015-06-30 DIAGNOSIS — L309 Dermatitis, unspecified: Secondary | ICD-10-CM | POA: Insufficient documentation

## 2015-06-30 DIAGNOSIS — K219 Gastro-esophageal reflux disease without esophagitis: Secondary | ICD-10-CM | POA: Insufficient documentation

## 2015-06-30 DIAGNOSIS — R252 Cramp and spasm: Secondary | ICD-10-CM

## 2015-06-30 LAB — LIPID PANEL
CHOLESTEROL: 234 mg/dL — AB (ref 0–200)
HDL: 74.6 mg/dL (ref >39.00)
LDL Cholesterol: 149 mg/dL — ABNORMAL HIGH (ref 0–99)
NONHDL: 158.91
Total CHOL/HDL Ratio: 3
Triglycerides: 52 mg/dL (ref 0.0–149.0)
VLDL: 10.4 mg/dL (ref 0.0–40.0)

## 2015-06-30 LAB — CBC
HEMATOCRIT: 40 % (ref 36.0–46.0)
Hemoglobin: 12.8 g/dL (ref 12.0–15.0)
MCHC: 32 g/dL (ref 30.0–36.0)
MCV: 84.7 fl (ref 78.0–100.0)
Platelets: 208 10*3/uL (ref 150.0–400.0)
RBC: 4.72 Mil/uL (ref 3.87–5.11)
RDW: 14.9 % (ref 11.5–15.5)
WBC: 3.3 10*3/uL — AB (ref 4.0–10.5)

## 2015-06-30 LAB — VITAMIN D 25 HYDROXY (VIT D DEFICIENCY, FRACTURES): VITD: 66.2 ng/mL (ref 30.00–100.00)

## 2015-06-30 MED ORDER — CLOBETASOL PROPIONATE 0.05 % EX CREA: TOPICAL_CREAM | Freq: Two times a day (BID) | CUTANEOUS | Status: DC

## 2015-06-30 NOTE — Assessment & Plan Note (Signed)
This is been a persistent issue for number of years. We will check electrolytes and kidney function and vitamin D today.

## 2015-06-30 NOTE — Progress Notes (Signed)
Pre visit review using our clinic review tool, if applicable. No additional management support is needed unless otherwise documented below in the visit note. 

## 2015-06-30 NOTE — Progress Notes (Signed)
Patient ID: [name], female   DOB: 1946/01/18, 69 y.o.   MRN: [id number]  [name], MD Phone: [phone]  Lindsey Foster is a 69 y.o. female who presents today for new patient visit.  Chest pain: Patient notes for the last month or so she has intermittently had central chest discomfort. She describes it as a bubble sensation. It can occur whenever. Not specifically worse with exertion. She notes it lasts very briefly for less than 1-2 seconds. She has no shortness of breath, diaphoresis, or radiation of this pain. She has had this previously. She previously had a negative stress test and Holter monitor and echo in 2013. She saw cardiology for this last week and they're planning a stress echo. She's not ever had a DVT or PE. She's not had any recent long trips or surgeries. She does note a history of reflux in the past. She does not have any chest pain at this time. Has not had any chest pain last week.  Eczema: Patient notes for the last 15-20 years she's had an issue with eczema on her lower extremities. This is isolated to the area between her knees and ankles. 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 She's been using clobetasol cream for this. This is sometimes helpful. She denies any itching.  Constipation: Patient notes for the past 3-4 weeks she has felt constipated. She notes she traveled around the time this started and she typically gets constipation with traveling. She notes her stools are usually firm. She feels as though she has not been emptying completely. She's had decreased diameter of her stools, though states they're not pencil like. She did have small amount of blood on the toilet paper about a week ago with wiping. There is no blood in the toilet water or on the stool. She notes the bleeding resolved quickly. She notes this occurred after having a large bowel movement. She has a BM daily. She denies  abdominal pain, nausea, and vomiting. She had a colonoscopy in 2011 that revealed diverticulosis.  Muscle cramping: Patient notes scattered muscle cramping that has been occurring intermittently for 6-7 years. Typically lasts for about 30 seconds and then goes away on its own. Can have them in her arms and legs. Though has not had any in her legs recently. She notes these did improve after being started on vitamin D supplementation. She's been taking vitamin D since 2012. She has no cramps at this time.  Active Ambulatory Problems    Diagnosis Date Noted  . Chest pain 06/30/2015  . Eczema 06/30/2015  . Blood in stool 06/30/2015  . Muscle cramping 06/30/2015   Resolved Ambulatory Problems    Diagnosis Date Noted  . No Resolved Ambulatory Problems   Past Medical History  Diagnosis Date  . Arthritis   . Breast cancer ([place])   . Chickenpox   . Diverticulosis   . GERD (gastroesophageal reflux disease)   . Allergic rhinitis   . Heart murmur   . Hypertension   . Hyperlipidemia   . CKD (chronic kidney disease)   . Colon polyps   . Multiple thyroid nodules   . UTI (lower urinary tract infection)   . Urinary incontinence     Family History  Problem Relation Age of Onset  . Heart disease      Parent  . Kidney disease      Parent  . Uterine cancer Maternal Aunt  Social History   Social History  . Marital Status: Married    Spouse Name: N/A  . Number of Children: N/A  . Years of Education: N/A   Occupational History  . Not on file.   Social History Main Topics  . Smoking status: Former Research scientist (life sciences)  . Smokeless tobacco: Not on file  . Alcohol Use: No  . Drug Use: No  . Sexual Activity: Not on file   Other Topics Concern  . Not on file   Social History Narrative    ROS   General:  Negative for nexplained weight loss, fever Skin: Negative for new or changing mole, sore that won't heal HEENT: Negative for trouble hearing, trouble seeing, ringing in ears, mouth sores,  hoarseness, change in voice, dysphagia. CV:  Positive for chest pain, Negative for dyspnea, edema, palpitations Resp: Negative for cough, dyspnea, hemoptysis GI: Positive for constipation and blood in stool, Negative for nausea, vomiting, diarrhea, abdominal pain, melena GU: Positive for leakage of urine, Negative for dysuria, urinary hesitance, hematuria, vaginal or penile discharge, polyuria, sexual difficulty, lumps in testicle or breasts MSK: Positive for muscle cramps, Negative for joint pain or swelling Neuro: Negative for headaches, weakness, numbness, dizziness, passing out/fainting Psych: Negative for depression, anxiety, memory problems  Objective  Physical Exam Filed Vitals:   06/30/15 0853  BP: 124/76  Pulse: 69  Temp: 98.6 F (37 C)    Physical Exam  Constitutional: She is well-developed, well-nourished, and in no distress.  HENT:  Head: Normocephalic and atraumatic.  Right Ear: External ear normal.  Left Ear: External ear normal.  Mouth/Throat: Oropharynx is clear and moist. No oropharyngeal exudate.  Eyes: Conjunctivae are normal. Pupils are equal, round, and reactive to light.  Neck: Neck supple.  Cardiovascular: Normal rate, regular rhythm and normal heart sounds.  Exam reveals no gallop and no friction rub.   No murmur heard. Pulmonary/Chest: Effort normal and breath sounds normal. No respiratory distress. She has no wheezes. She has no rales.  Abdominal: Soft. Bowel sounds are normal. She exhibits no distension. There is no tenderness. There is no rebound and no guarding.  Musculoskeletal: She exhibits no edema.  Lymphadenopathy:    She has no cervical adenopathy.  Neurological: She is alert.  CN 2-12 intact, 5/5 strength in bilateral biceps, triceps, grip, quads, hamstrings, plantar and dorsiflexion, sensation to light touch intact in bilateral UE and LE, normal gait, 2+ patellar reflexes  Skin: Skin is warm and dry. She is not diaphoretic.  Patches of  eczema on bilateral legs between ankles and knee  Psychiatric: Mood and affect normal.     Assessment/Plan:   Chest pain Patient with 1-2 months of increasing chest discomfort. Has atypical aspects. Has seen cardiology and has plan for stress test next week. She has no chest pain at this time. Patient will keep appointment for stress test next week. If this reveals a noncardiac cause suspect that this could be related to reflux given her history of reflux or esophageal spasm. Lung exam is normal and oxygen saturation is normal making pulmonary cause unlikely. Unlikely to be VTE given lack of history and stable oxygen saturation and heart rate. Given return precautions.  Eczema Continue clobetasol.  Blood in stool Patient with a single episode of blood when wiping. Exam today did not reveal any cause. FOBT was negative. She has been constipated recently and could've had a small amount of tearing related to passing a large bowel movement. She does report change in caliber of  stool. Given bleeding and caliber changes in stool we will refer to GI for further evaluation. Given return precautions.  Muscle cramping This is been a persistent issue for number of years. We will check electrolytes and kidney function and vitamin D today.    Orders Placed This Encounter  Procedures  . CBC  . Lipid Profile  . Vitamin D (25 hydroxy)  . Ambulatory referral to Gastroenterology    Referral Priority:  Routine    Referral Type:  Consultation    Referral Reason:  Specialty Services Required    Number of Visits Requested:  1   [name]

## 2015-06-30 NOTE — Assessment & Plan Note (Signed)
Patient with a single episode of blood when wiping. Exam today did not reveal any cause. FOBT was negative. She has been constipated recently and could've had a small amount of tearing related to passing a large bowel movement. She does report change in caliber of stool. Given bleeding and caliber changes in stool we will refer to GI for further evaluation. Given return precautions.

## 2015-06-30 NOTE — Assessment & Plan Note (Signed)
Continue clobetasol

## 2015-06-30 NOTE — Assessment & Plan Note (Signed)
Patient with 1-2 months of increasing chest discomfort. Has atypical aspects. Has seen cardiology and has plan for stress test next week. She has no chest pain at this time. Patient will keep appointment for stress test next week. If this reveals a noncardiac cause suspect that this could be related to reflux given her history of reflux or esophageal spasm. Lung exam is normal and oxygen saturation is normal making pulmonary cause unlikely. Unlikely to be VTE given lack of history and stable oxygen saturation and heart rate. Given return precautions.

## 2015-06-30 NOTE — Patient Instructions (Addendum)
Nice to meet you. Please keep your appointment for a stress test next week. We will refer you to GI for evaluation of rectal bleeding. I have refilled your clobetasol. We will request records from your nephrologist to determine what blood work has been done recently. If you develop chest pain, shortness of breath, palpitations, abdominal pain, nausea, vomiting, diarrhea, blood in her stool, fevers, or any new or change in symptoms please seek medical attention.

## 2015-07-01 ENCOUNTER — Telehealth: Payer: Self-pay | Admitting: *Deleted

## 2015-07-01 ENCOUNTER — Encounter: Payer: Self-pay | Admitting: Family Medicine

## 2015-07-01 NOTE — Telephone Encounter (Signed)
Patient requested a copy of her blood work to be mailed to her.

## 2015-07-02 NOTE — Telephone Encounter (Signed)
Labs mailed

## 2015-07-07 DIAGNOSIS — R0789 Other chest pain: Secondary | ICD-10-CM | POA: Diagnosis not present

## 2015-07-15 DIAGNOSIS — I10 Essential (primary) hypertension: Secondary | ICD-10-CM | POA: Diagnosis not present

## 2015-07-15 DIAGNOSIS — R0602 Shortness of breath: Secondary | ICD-10-CM | POA: Diagnosis not present

## 2015-07-15 DIAGNOSIS — R079 Chest pain, unspecified: Secondary | ICD-10-CM | POA: Diagnosis not present

## 2015-07-20 ENCOUNTER — Ambulatory Visit
Admission: RE | Admit: 2015-07-20 | Discharge: 2015-07-20 | Disposition: A | Discharge status: Home / Self Care | Payer: Medicare Other | Source: Ambulatory Visit | Attending: Hematology and Oncology | Admitting: Hematology and Oncology

## 2015-07-20 ENCOUNTER — Other Ambulatory Visit: Payer: Self-pay | Admitting: Hematology and Oncology

## 2015-07-20 DIAGNOSIS — M810 Age-related osteoporosis without current pathological fracture: Secondary | ICD-10-CM

## 2015-07-20 DIAGNOSIS — Z13820 Encounter for screening for osteoporosis: Secondary | ICD-10-CM | POA: Diagnosis not present

## 2015-07-20 DIAGNOSIS — Z780 Asymptomatic menopausal state: Secondary | ICD-10-CM | POA: Diagnosis not present

## 2015-07-20 DIAGNOSIS — Z1231 Encounter for screening mammogram for malignant neoplasm of breast: Secondary | ICD-10-CM | POA: Insufficient documentation

## 2015-07-22 ENCOUNTER — Other Ambulatory Visit: Payer: Self-pay

## 2015-07-22 DIAGNOSIS — C50911 Malignant neoplasm of unspecified site of right female breast: Secondary | ICD-10-CM

## 2015-07-24 ENCOUNTER — Inpatient Hospital Stay: Payer: Medicare Other | Attending: Hematology and Oncology | Admitting: Hematology and Oncology

## 2015-07-24 ENCOUNTER — Other Ambulatory Visit: Payer: Self-pay

## 2015-07-24 ENCOUNTER — Inpatient Hospital Stay: Payer: Medicare Other

## 2015-07-24 DIAGNOSIS — Z86000 Personal history of in-situ neoplasm of breast: Secondary | ICD-10-CM

## 2015-07-24 DIAGNOSIS — E785 Hyperlipidemia, unspecified: Secondary | ICD-10-CM

## 2015-07-24 DIAGNOSIS — D0511 Intraductal carcinoma in situ of right breast: Secondary | ICD-10-CM

## 2015-07-24 DIAGNOSIS — E079 Disorder of thyroid, unspecified: Secondary | ICD-10-CM

## 2015-07-24 DIAGNOSIS — Z9223 Personal history of estrogen therapy: Secondary | ICD-10-CM | POA: Diagnosis not present

## 2015-07-24 DIAGNOSIS — Z853 Personal history of malignant neoplasm of breast: Secondary | ICD-10-CM | POA: Diagnosis not present

## 2015-07-24 DIAGNOSIS — D0510 Intraductal carcinoma in situ of unspecified breast: Secondary | ICD-10-CM | POA: Insufficient documentation

## 2015-07-24 DIAGNOSIS — K219 Gastro-esophageal reflux disease without esophagitis: Secondary | ICD-10-CM

## 2015-07-24 DIAGNOSIS — Z923 Personal history of irradiation: Secondary | ICD-10-CM

## 2015-07-24 DIAGNOSIS — M810 Age-related osteoporosis without current pathological fracture: Secondary | ICD-10-CM | POA: Diagnosis not present

## 2015-07-24 DIAGNOSIS — Z170 Estrogen receptor positive status [ER+]: Secondary | ICD-10-CM | POA: Diagnosis not present

## 2015-07-24 DIAGNOSIS — I129 Hypertensive chronic kidney disease with stage 1 through stage 4 chronic kidney disease, or unspecified chronic kidney disease: Secondary | ICD-10-CM | POA: Diagnosis not present

## 2015-07-24 DIAGNOSIS — M1990 Unspecified osteoarthritis, unspecified site: Secondary | ICD-10-CM

## 2015-07-24 DIAGNOSIS — Z79899 Other long term (current) drug therapy: Secondary | ICD-10-CM | POA: Diagnosis not present

## 2015-07-24 DIAGNOSIS — N189 Chronic kidney disease, unspecified: Secondary | ICD-10-CM

## 2015-07-24 DIAGNOSIS — Z87891 Personal history of nicotine dependence: Secondary | ICD-10-CM | POA: Diagnosis not present

## 2015-07-24 DIAGNOSIS — C50911 Malignant neoplasm of unspecified site of right female breast: Secondary | ICD-10-CM

## 2015-07-24 LAB — CBC WITH DIFFERENTIAL/PLATELET
Basophils Absolute: 0 10*3/uL (ref 0–0.1)
Basophils Relative: 1 %
Eosinophils Absolute: 0.1 10*3/uL (ref 0–0.7)
Eosinophils Relative: 3 %
HCT: 39.5 % (ref 35.0–47.0)
Hemoglobin: 12.8 g/dL (ref 12.0–16.0)
Lymphocytes Relative: 40 %
Lymphs Abs: 1.2 10*3/uL (ref 1.0–3.6)
MCH: 27.3 pg (ref 26.0–34.0)
MCHC: 32.5 g/dL (ref 32.0–36.0)
MCV: 84.1 fL (ref 80.0–100.0)
Monocytes Absolute: 0.5 10*3/uL (ref 0.2–0.9)
Monocytes Relative: 15 %
Neutro Abs: 1.3 10*3/uL — ABNORMAL LOW (ref 1.4–6.5)
Neutrophils Relative %: 41 %
Platelets: 194 10*3/uL (ref 150–440)
RBC: 4.69 MIL/uL (ref 3.80–5.20)
RDW: 14.8 % — ABNORMAL HIGH (ref 11.5–14.5)
WBC: 3.1 10*3/uL — ABNORMAL LOW (ref 3.6–11.0)

## 2015-07-24 LAB — COMPREHENSIVE METABOLIC PANEL
ALT: 17 U/L (ref 14–54)
AST: 18 U/L (ref 15–41)
Albumin: 4.1 g/dL (ref 3.5–5.0)
Alkaline Phosphatase: 57 U/L (ref 38–126)
Anion gap: 5 (ref 5–15)
BUN: 15 mg/dL (ref 6–20)
CO2: 30 mmol/L (ref 22–32)
Calcium: 9.4 mg/dL (ref 8.9–10.3)
Chloride: 102 mmol/L (ref 101–111)
Creatinine, Ser: 1.25 mg/dL — ABNORMAL HIGH (ref 0.44–1.00)
GFR calc Af Amer: 49 mL/min — ABNORMAL LOW (ref >60)
GFR calc non Af Amer: 42 mL/min — ABNORMAL LOW (ref >60)
Glucose, Bld: 98 mg/dL (ref 65–99)
Potassium: 4.4 mmol/L (ref 3.5–5.1)
Sodium: 137 mmol/L (ref 135–145)
Total Bilirubin: 0.3 mg/dL (ref 0.3–1.2)
Total Protein: 7.3 g/dL (ref 6.5–8.1)

## 2015-07-24 NOTE — Progress Notes (Signed)
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  At that time, she was seen for initial assessment by me.  Symptomatically, she was doing well.  Exam was stable.  Labs were unremarkable.  Bone density study on 07/20/2015 revealed a T score of -3.1 in the left hip.  Mammogram on 07/20/2015 revealed no evidence of malignancy.  Symptomatically, she notes no breast concerns. She notes trouble with pills in general. She believes some of it may be  psychological. She has struggled with her calcium pills and has tried grinding them up, splitting them, and gel caps. For the past 3 weeks, she has taken liquid calcium. She notes that her primary care physician had given her vitamin D in the past, which has now been stopped.  Past Medical History  Diagnosis Date  . Arthritis   . Blood in stool   . Chickenpox   . Diverticulosis   . GERD (gastroesophageal reflux disease)   . Allergic rhinitis   . Heart murmur   . Hypertension   . Hyperlipidemia   . CKD (chronic kidney disease)   . Colon polyps   . Multiple thyroid nodules   . UTI (lower urinary tract infection)   . Urinary incontinence   . Breast cancer [facility]) 2006    radiation     Past Surgical History  Procedure Laterality Date  . Thyroid nodule ablation  2003  . Breast lumpectomy  2006    DCIS  . Dilation and curettage of uterus  2012  . Uterine polyp removal  2009   . Cesarean section  1985  . Breast excisional biopsy Right 2006    positive    Family History  Problem Relation Age of Onset  . Heart disease      Parent  . Kidney disease      Parent  . Uterine cancer Maternal Aunt     Social History:  reports that she has quit smoking. She does not have any smokeless tobacco history on file. She reports that she does not  drink alcohol or use illicit drugs.  She is originally from [place].  The patient is alone today.  Allergies:  Allergies  Allergen Reactions  . Latex Rash    Current Medications: Current Outpatient Prescriptions  Medication Sig Dispense Refill  . amLODipine (NORVASC) 2.5 MG tablet Take by mouth.    . Calcium Citrate-Vitamin D3 1000-400 LIQD Take by mouth.    . clobetasol cream (TEMOVATE) 0.05 % Apply topically 2 (two) times daily. For 2 weeks, then just on week days 60 g 1  . fluticasone (FLONASE) 50 MCG/ACT nasal spray Place into the nose.    Marland Kitchen Olopatadine HCl 0.2 % SOLN Apply to eye.     No current facility-administered medications for this visit.    Review of Systems:  GENERAL:  Feels good.  Active.  No fevers, sweats or weight loss. PERFORMANCE STATUS (ECOG):  1 HEENT:  No visual changes, runny nose, sore throat, mouth sores or tenderness. Lungs: No shortness of breath or cough.  No hemoptysis. Cardiac:  No chest pain, palpitations, orthopnea, or PND. GI:  Trouble swallowing pills.  No nausea, vomiting, diarrhea, constipation, melena or hematochezia. GU:  No urgency, frequency, dysuria, or hematuria. Musculoskeletal:  No back pain.  No  joint pain.  No muscle tenderness. Extremities:  No pain or swelling. Skin:  No rashes or skin changes. Neuro:  No headache, numbness or weakness, balance or coordination issues. Endocrine:  No diabetes, thyroid issues, hot flashes or night sweats. Psych:  No mood changes, depression or anxiety. Pain:  No focal pain. Review of systems:  All other systems reviewed and found to be negative.  Physical Exam: Blood pressure 146/80, pulse 56, temperature 97.8 F (36.6 C), temperature source Tympanic, height 5' 7.72" (1.72 m), weight 184 lb 8.4 oz (83.7 kg). GENERAL:  Well developed, well nourished, sitting comfortably in the exam room in no acute distress. MENTAL STATUS:  Alert and oriented to person, place and time. HEAD:  Brown hair.   Normocephalic, atraumatic, face symmetric, no Cushingoid features. EYES:  Glasses.  Brown eyes.  Pupils equal round and reactive to light and accomodation.  No conjunctivitis or scleral icterus. ENT:  Oropharynx clear without lesion.  Tongue normal. Mucous membranes moist.  RESPIRATORY:  Clear to auscultation without rales, wheezes or rhonchi. CARDIOVASCULAR:  Regular rate and rhythm without murmur, rub or gallop. BREASTS:  Right breast with inferior scarring.  No discrete masses, skin changes or nipple discharge.  Left breast without masses, skin changes or nipple discharge.  Moderate fibrocystic changes. ABDOMEN:  Soft, non-tender, with active bowel sounds, and no hepatosplenomegaly.  No masses. SKIN:  No rashes, ulcers or lesions. EXTREMITIES: No edema, no skin discoloration or tenderness.  No palpable cords. LYMPH NODES: No palpable cervical, supraclavicular, axillary or inguinal adenopathy  NEUROLOGICAL: Unremarkable. PSYCH:  Appropriate.  Appointment on 07/24/2015  Component Date Value Ref Range Status  . WBC 07/24/2015 3.1* 3.6 - 11.0 K/uL Final  . RBC 07/24/2015 4.69  3.80 - 5.20 MIL/uL Final  . Hemoglobin 07/24/2015 12.8  12.0 - 16.0 g/dL Final  . HCT 07/24/2015 39.5  35.0 - 47.0 % Final  . MCV 07/24/2015 84.1  80.0 - 100.0 fL Final  . MCH 07/24/2015 27.3  26.0 - 34.0 pg Final  . MCHC 07/24/2015 32.5  32.0 - 36.0 g/dL Final  . RDW 07/24/2015 14.8* 11.5 - 14.5 % Final  . Platelets 07/24/2015 194  150 - 440 K/uL Final  . Neutrophils Relative % 07/24/2015 41   Final  . Neutro Abs 07/24/2015 1.3* 1.4 - 6.5 K/uL Final  . Lymphocytes Relative 07/24/2015 40   Final  . Lymphs Abs 07/24/2015 1.2  1.0 - 3.6 K/uL Final  . Monocytes Relative 07/24/2015 15   Final  . Monocytes Absolute 07/24/2015 0.5  0.2 - 0.9 K/uL Final  . Eosinophils Relative 07/24/2015 3   Final  . Eosinophils Absolute 07/24/2015 0.1  0 - 0.7 K/uL Final  . Basophils Relative 07/24/2015 1   Final  . Basophils Absolute  07/24/2015 0.0  0 - 0.1 K/uL Final  . Sodium 07/24/2015 137  135 - 145 mmol/L Final  . Potassium 07/24/2015 4.4  3.5 - 5.1 mmol/L Final  . Chloride 07/24/2015 102  101 - 111 mmol/L Final  . CO2 07/24/2015 30  22 - 32 mmol/L Final  . Glucose, Bld 07/24/2015 98  65 - 99 mg/dL Final  . BUN 07/24/2015 15  6 - 20 mg/dL Final  . Creatinine, Ser 07/24/2015 1.25* 0.44 - 1.00 mg/dL Final  . Calcium 07/24/2015 9.4  8.9 - 10.3 mg/dL Final  . Total Protein 07/24/2015 7.3  6.5 - 8.1 g/dL Final  . Albumin 07/24/2015 4.1  3.5 - 5.0 g/dL Final  . AST 07/24/2015  18  15 - 41 U/L Final  . ALT 07/24/2015 17  14 - 54 U/L Final  . Alkaline Phosphatase 07/24/2015 57  38 - 126 U/L Final  . Total Bilirubin 07/24/2015 0.3  0.3 - 1.2 mg/dL Final  . GFR calc non Af Amer 07/24/2015 42* >60 mL/min Final  . GFR calc Af Amer 07/24/2015 49* >60 mL/min Final   Comment: (NOTE) The eGFR has been calculated using the CKD EPI equation. This calculation has not been validated in all clinical situations. eGFR's persistently <60 mL/min signify possible Chronic Kidney Disease.   . Anion gap 07/24/2015 5  5 - 15 Final  . CA 27.29 07/24/2015 43.9* 0.0 - 38.6 U/mL Final   Comment: (NOTE) [employer] Cent[employer]/ACS methodology Performed At: [facility] [address] [place], [place] [id number] Lindon Romp MD OL:[id number]     Assessment:  Lindsey Foster is a 69 y.o. 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  She received radiation to the right breast from 08/2005 - 09/2005 at the [facility] in [place].  She then received tamoxifen for 5 years (12/2005-11/2010).  Bilateral mammogram on 07/20/2015 revealed no evidence of malignancy. CA27.29 was 37.9 on 07/04/2014.  She has progressive osteoporosis.  Bone density study on 12/11/2012 revealed a T score of -2.3 in the left femur.  Bone density study on 07/17/2014 revealed a T-score of -3.0 in the left femoral  neck and -1.8 in the L1-L4 spine.  Bone density study on 07/20/2015 revealed a T score of -3.1 in the left hip.    She previously developed GI symptoms on Fosamax.  She declined Zometa and Prolia.   Symptomatically, she is doing well.  Exam is unremarkable.  Plan: 1. Labs today:  CBC with diff, CMP, CA27.29. 2. Discuss declining bone density.  Patient not interested in treatment.  Encouraged calcium 1200 mg and vitamin D 800 IU a day.  Information on Prolia.  Encourage ongoing discussions with PCP. 3. Schedule mammogram 07/19/2016. 4. Copy of labs for patient. 5. RTC in 1 year for MD assess, labs (CBC with diff, CMP), and review of mammogram.   Lequita Asal, MD  07/24/2015, 10:27 AM

## 2015-07-24 NOTE — Patient Instructions (Signed)
Denosumab injection  What is this medicine?  DENOSUMAB (den oh sue mab) slows bone breakdown. Prolia is used to treat osteoporosis in women after menopause and in men. Xgeva is used to prevent bone fractures and other bone problems caused by cancer bone metastases. Xgeva is also used to treat giant cell tumor of the bone.  This medicine may be used for other purposes; ask your health care provider or pharmacist if you have questions.  What should I tell my health care provider before I take this medicine?  They need to know if you have any of these conditions:  -dental disease  -eczema  -infection or history of infections  -kidney disease or on dialysis  -low blood calcium or vitamin D  -malabsorption syndrome  -scheduled to have surgery or tooth extraction  -taking medicine that contains denosumab  -thyroid or parathyroid disease  -an unusual reaction to denosumab, other medicines, foods, dyes, or preservatives  -pregnant or trying to get pregnant  -breast-feeding  How should I use this medicine?  This medicine is for injection under the skin. It is given by a health care professional in a hospital or clinic setting.  If you are getting Prolia, a special MedGuide will be given to you by the pharmacist with each prescription and refill. Be sure to read this information carefully each time.  For Prolia, talk to your pediatrician regarding the use of this medicine in children. Special care may be needed. For Xgeva, talk to your pediatrician regarding the use of this medicine in children. While this drug may be prescribed for children as young as 13 years for selected conditions, precautions do apply.  Overdosage: If you think you have taken too much of this medicine contact a poison control center or emergency room at once.  NOTE: This medicine is only for you. Do not share this medicine with others.  What if I miss a dose?  It is important not to miss your dose. Call your doctor or health care professional if you are  unable to keep an appointment.  What may interact with this medicine?  Do not take this medicine with any of the following medications:  -other medicines containing denosumab  This medicine may also interact with the following medications:  -medicines that suppress the immune system  -medicines that treat cancer  -steroid medicines like prednisone or cortisone  This list may not describe all possible interactions. Give your health care provider a list of all the medicines, herbs, non-prescription drugs, or dietary supplements you use. Also tell them if you smoke, drink alcohol, or use illegal drugs. Some items may interact with your medicine.  What should I watch for while using this medicine?  Visit your doctor or health care professional for regular checks on your progress. Your doctor or health care professional may order blood tests and other tests to see how you are doing.  Call your doctor or health care professional if you get a cold or other infection while receiving this medicine. Do not treat yourself. This medicine may decrease your body's ability to fight infection.  You should make sure you get enough calcium and vitamin D while you are taking this medicine, unless your doctor tells you not to. Discuss the foods you eat and the vitamins you take with your health care professional.  See your dentist regularly. Brush and floss your teeth as directed. Before you have any dental work done, tell your dentist you are receiving this medicine.  Do   not become pregnant while taking this medicine or for 5 months after stopping it. Women should inform their doctor if they wish to become pregnant or think they might be pregnant. There is a potential for serious side effects to an unborn child. Talk to your health care professional or pharmacist for more information.  What side effects may I notice from receiving this medicine?  Side effects that you should report to your doctor or health care professional as soon as  possible:  -allergic reactions like skin rash, itching or hives, swelling of the face, lips, or tongue  -breathing problems  -chest pain  -fast, irregular heartbeat  -feeling faint or lightheaded, falls  -fever, chills, or any other sign of infection  -muscle spasms, tightening, or twitches  -numbness or tingling  -skin blisters or bumps, or is dry, peels, or red  -slow healing or unexplained pain in the mouth or jaw  -unusual bleeding or bruising  Side effects that usually do not require medical attention (Report these to your doctor or health care professional if they continue or are bothersome.):  -muscle pain  -stomach upset, gas  This list may not describe all possible side effects. Call your doctor for medical advice about side effects. You may report side effects to FDA at 1-800-FDA-1088.  Where should I keep my medicine?  This medicine is only given in a clinic, doctor's office, or other health care setting and will not be stored at home.  NOTE: This sheet is a summary. It may not cover all possible information. If you have questions about this medicine, talk to your doctor, pharmacist, or health care provider.      2016, Elsevier/Gold Standard. (2012-01-30 12:37:47)

## 2015-07-24 NOTE — Progress Notes (Signed)
Patient is here for follow-up of DCIS. Patient states that overall she has been doing well. She recently had a mammogram at [facility]. Appetite has been good and she denies any pain.

## 2015-07-25 ENCOUNTER — Encounter: Payer: Self-pay | Admitting: Hematology and Oncology

## 2015-07-25 LAB — CANCER ANTIGEN 27.29: CA 27.29: 43.9 U/mL — ABNORMAL HIGH (ref 0.0–38.6)

## 2015-07-29 ENCOUNTER — Ambulatory Visit: Payer: PRIVATE HEALTH INSURANCE

## 2015-07-30 ENCOUNTER — Encounter: Payer: Self-pay | Admitting: Family Medicine

## 2015-07-30 ENCOUNTER — Ambulatory Visit (INDEPENDENT_AMBULATORY_CARE_PROVIDER_SITE_OTHER): Payer: Medicare Other | Admitting: Family Medicine

## 2015-07-30 VITALS — BP 128/72 | HR 79 | Temp 98.6°F | Ht 67.72 in | Wt 183.4 lb

## 2015-07-30 DIAGNOSIS — D72819 Decreased white blood cell count, unspecified: Secondary | ICD-10-CM | POA: Diagnosis not present

## 2015-07-30 DIAGNOSIS — M810 Age-related osteoporosis without current pathological fracture: Secondary | ICD-10-CM | POA: Diagnosis not present

## 2015-07-30 DIAGNOSIS — E785 Hyperlipidemia, unspecified: Secondary | ICD-10-CM | POA: Insufficient documentation

## 2015-07-30 DIAGNOSIS — I10 Essential (primary) hypertension: Secondary | ICD-10-CM | POA: Insufficient documentation

## 2015-07-30 DIAGNOSIS — M25662 Stiffness of left knee, not elsewhere classified: Secondary | ICD-10-CM

## 2015-07-30 LAB — LDL CHOLESTEROL, DIRECT: LDL DIRECT: 128 mg/dL

## 2015-07-30 NOTE — Assessment & Plan Note (Addendum)
Patient with recent DEXA scan with a T score consistent with osteoporosis. Has previously been on Fosamax and does not want a bisphosphonate. Discussed prolia with her. She declined this. Patient would like to continue with vitamin D and calcium supplementation. She's currently taking vitamin D 1000 international units daily and calcium 1000 mg daily. She will continue this.

## 2015-07-30 NOTE — Progress Notes (Signed)
Patient ID: Lindsey Foster, female   DOB: 08/11/1946, 69 y.o.   MRN: [id number]  [name], MD Phone: [phone]  Lindsey Foster is a 69 y.o. female who presents today for follow-up.  HYPERLIPIDEMIA Symptoms Chest pain on exertion:  No   Leg claudication:   No    Shortness of breath: No Last cholesterol noted to be 234 total and LDL of 149. Patient notes she has a history of elevated cholesterol in the past with the highest being total of 257. She was on Zocor for about 3 weeks in the past, though her PCP took her off of it because her cholesterol had improved. She does not want cholesterol medicine at this time. She started oatmeal diet. She does water aerobics 2-3 times a week.  HYPERTENSION Disease Monitoring Home BP Monitoring does not typically monitor at home, though notes it is checked at her variety of doctors offices with a range of 122-146/76-82  Chest pain- no    Dyspnea- no Medications Compliance-  takes amlodipine 5 mg daily.  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 Patient notes she's been evaluated by hematology 8-9 years ago for this. States she underwent a significant workup for this and was advised that this was her normal level of white blood cells. She notes her brother has similar issue. She will bring in the paperwork for this workup and drop it off at the front.  Osteoporosis: Patient recently had a DEXA scan that showed a T score of -3.1 in her left femur. She has a history of osteoporosis in the past with her lowest T score being -3.4. She was previously on Fosamax though noted she could not tolerate that. She notes her T-scores had improved when she is on tamoxifen for breast cancer, though she came off of that in 2012 and her T-scores have progressively gotten worse. She takes calcium and vitamin D daily. She was on vitamin D 50,000 units weekly until recently, though on recent check her vitamin D level was in the normal  range. Notes her oncologist discussed prolia with her, though she declined this.  Left knee stiffness: Patient notes long history of this. She notes she has meniscal issue. States she has previously been offered a knee replacement, though she declined this. She notes water aerobics helped this significantly. She typically only has stiffness in the morning or when she's been sitting for long period of time. No numbness or weakness in her legs. No back pain. No incontinence or saddle anesthesia. No fevers.  PMH: former smoker.   ROS see history of present illness   Objective  Physical Exam Filed Vitals:   07/30/15 0933  BP: 128/72  Pulse: 79  Temp: 98.6 F (37 C)    BP Readings from Last 3 Encounters:  07/30/15 128/72  07/24/15 146/80  06/30/15 124/76    Physical Exam  Constitutional: She is well-developed, well-nourished, and in no distress.  HENT:  Head: Normocephalic and atraumatic.  Mouth/Throat: Oropharynx is clear and moist. No oropharyngeal exudate.  Eyes: Conjunctivae are normal. Pupils are equal, round, and reactive to light.  Cardiovascular: Normal rate and regular rhythm.   Pulmonary/Chest: Effort normal and breath sounds normal.  Musculoskeletal:  Bilateral knee symmetrical, no effusion, no tenderness to palpation of the joint line or bony structures, no erythema, no warmth, no ligamentous laxity, negative McMurray's   Neurological: She is alert. Gait normal.  5 out of 5 strength in bilateral quads,  hamstrings, plantar flexion, and dorsiflexion, sensation to light touch intact in bilateral lower extremities, 2+ patellar reflexes  Skin: Skin is warm and dry. She is not diaphoretic.     Assessment/Plan: Please see individual problem list.  Osteoporosis Patient with recent DEXA scan with a T score consistent with osteoporosis. Has previously been on Fosamax and does not want a bisphosphonate. Discussed prolia with her. She declined this. Patient would like to  continue with vitamin D and calcium supplementation. She's currently taking vitamin D 1000 international units daily and calcium 1000 mg daily. She will continue this.  Hyperlipidemia Discussed statin use with patient. She declines this. She wants to stick with diet and exercise. She was advised on diet and given a handout. She is advised on exercise. We will check a direct LDL today given that she has changed her diet in the last month.  Essential hypertension At goal today. Patient's goal is to come off the medicine completely for this. Advised diet and exercise would be the way to do this. She will record her blood pressure at home for the next 2 weeks. If she has remained less than goal advised that we could consider decreasing her medication dose. Goal is 130/80.  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 She was previously been worked up by hematology. She will bring [place] those records.  Stiffness of left knee Suspect this is related to arthritis. Minimally bothers patient this time. Reinforced exercise and leg strengthening. Exam is benign. Neurologically intact in the lower extremity. She'll continue to monitor.    Orders Placed This Encounter  Procedures  . Direct LDL    Dragon voice recognition software was used during the dictation process of this note. If any phrases or words seem inappropriate it is likely secondary to the translation process being inefficient.  [name]

## 2015-07-30 NOTE — Progress Notes (Signed)
Pre visit review using our clinic review tool, if applicable. No additional management support is needed unless otherwise documented below in the visit note. 

## 2015-07-30 NOTE — Assessment & Plan Note (Signed)
Patient reports is a chronic stable issue for her. She was previously been worked up by hematology. She will bring [place] those records.

## 2015-07-30 NOTE — Assessment & Plan Note (Signed)
Suspect this is related to arthritis. Minimally bothers patient this time. Reinforced exercise and leg strengthening. Exam is benign. Neurologically intact in the lower extremity. She'll continue to monitor.

## 2015-07-30 NOTE — Assessment & Plan Note (Signed)
Discussed statin use with patient. She declines this. She wants to stick with diet and exercise. She was advised on diet and given a handout. She is advised on exercise. We will check a direct LDL today given that she has changed her diet in the last month.

## 2015-07-30 NOTE — Assessment & Plan Note (Signed)
At goal today. Patient's goal is to come off the medicine completely for this. Advised diet and exercise would be the way to do this. She will record her blood pressure at home for the next 2 weeks. If she has remained less than goal advised that we could consider decreasing her medication dose. Goal is 130/80.

## 2015-07-30 NOTE — Patient Instructions (Signed)
Nice to see you. Please continue the calcium and vitamin D supplementation as you have been. Please monitor blood pressure daily at home and record this. Please bring [place] the records from the hematologist. Please continue to work out work on diet for your cholesterol. If you develop chest pain, shortness of breath, fever, or any new or changing symptoms please seek medical attention.

## 2015-07-31 ENCOUNTER — Encounter: Payer: Self-pay | Admitting: Family Medicine

## 2015-08-04 ENCOUNTER — Encounter: Payer: Self-pay | Admitting: Obstetrics and Gynecology

## 2015-08-11 ENCOUNTER — Telehealth: Payer: Self-pay | Admitting: Family Medicine

## 2015-08-11 NOTE — Telephone Encounter (Signed)
Spoke with patient and explained that only lab that was ordered was the LDL and that has already been sent to her. Patient verbalized understanding.

## 2015-08-11 NOTE — Telephone Encounter (Signed)
Patient would like her lab results sent to her via mail. She wants to see all of the labs that were done and what all was out of range.

## 2015-08-18 ENCOUNTER — Other Ambulatory Visit: Payer: Self-pay | Admitting: Hematology and Oncology

## 2015-08-18 ENCOUNTER — Telehealth: Payer: Self-pay | Admitting: Hematology and Oncology

## 2015-08-18 DIAGNOSIS — D0511 Intraductal carcinoma in situ of right breast: Secondary | ICD-10-CM

## 2015-08-18 NOTE — Telephone Encounter (Signed)
Re:  CA27.29  Called patient about slight elevation of CA27.29.  Repeat blood draw 1 month after initial test requested.  No concerning symptoms.  Significance unclear.  Lequita Asal, MD

## 2015-08-20 DIAGNOSIS — R1032 Left lower quadrant pain: Secondary | ICD-10-CM | POA: Diagnosis not present

## 2015-08-20 DIAGNOSIS — Z8719 Personal history of other diseases of the digestive system: Secondary | ICD-10-CM | POA: Diagnosis not present

## 2015-08-20 DIAGNOSIS — R194 Change in bowel habit: Secondary | ICD-10-CM | POA: Diagnosis not present

## 2015-08-20 DIAGNOSIS — R1031 Right lower quadrant pain: Secondary | ICD-10-CM | POA: Diagnosis not present

## 2015-08-21 ENCOUNTER — Inpatient Hospital Stay: Payer: Medicare Other | Attending: Hematology and Oncology

## 2015-08-21 DIAGNOSIS — Z170 Estrogen receptor positive status [ER+]: Secondary | ICD-10-CM | POA: Diagnosis not present

## 2015-08-21 DIAGNOSIS — Z9223 Personal history of estrogen therapy: Secondary | ICD-10-CM | POA: Insufficient documentation

## 2015-08-21 DIAGNOSIS — Z923 Personal history of irradiation: Secondary | ICD-10-CM | POA: Insufficient documentation

## 2015-08-21 DIAGNOSIS — Z86000 Personal history of in-situ neoplasm of breast: Secondary | ICD-10-CM | POA: Insufficient documentation

## 2015-08-21 DIAGNOSIS — D0511 Intraductal carcinoma in situ of right breast: Secondary | ICD-10-CM

## 2015-08-22 LAB — CANCER ANTIGEN 27.29: CA 27.29: 54.3 U/mL — ABNORMAL HIGH (ref 0.0–38.6)

## 2015-08-28 ENCOUNTER — Other Ambulatory Visit: Payer: Self-pay | Admitting: Hematology and Oncology

## 2015-08-28 ENCOUNTER — Telehealth: Payer: Self-pay | Admitting: Hematology and Oncology

## 2015-08-28 DIAGNOSIS — D0511 Intraductal carcinoma in situ of right breast: Secondary | ICD-10-CM

## 2015-08-28 NOTE — Telephone Encounter (Signed)
Re:  Elevated CA27.29  Reviewed with patient recent elevated CA27.29.  This was a follow-up lab from 1 month prior.  The patient denies any symptoms.  The etiology is unclear as she has DCIS (stage 0) breast cancer.  We discussed her other medical problems including her chronic low WBC.  This appears familial per the patient.  I discussed consideration of a PET scan.  The patient opted for a lab recheck in 1 month.  If the marker continues to rise, we will request approval for a PET scan.  Lequita Asal, MD

## 2015-09-21 ENCOUNTER — Other Ambulatory Visit: Payer: PRIVATE HEALTH INSURANCE

## 2015-09-22 ENCOUNTER — Inpatient Hospital Stay: Payer: Medicare Other | Attending: Hematology and Oncology

## 2015-09-22 DIAGNOSIS — Z923 Personal history of irradiation: Secondary | ICD-10-CM | POA: Insufficient documentation

## 2015-09-22 DIAGNOSIS — Z86000 Personal history of in-situ neoplasm of breast: Secondary | ICD-10-CM | POA: Diagnosis not present

## 2015-09-22 DIAGNOSIS — Z9223 Personal history of estrogen therapy: Secondary | ICD-10-CM | POA: Diagnosis not present

## 2015-09-22 DIAGNOSIS — Z170 Estrogen receptor positive status [ER+]: Secondary | ICD-10-CM | POA: Insufficient documentation

## 2015-09-22 DIAGNOSIS — D0511 Intraductal carcinoma in situ of right breast: Secondary | ICD-10-CM

## 2015-09-23 LAB — CANCER ANTIGEN 27.29: CA 27.29: 46.2 U/mL — ABNORMAL HIGH (ref 0.0–38.6)

## 2015-09-25 ENCOUNTER — Encounter: Payer: Self-pay | Admitting: *Deleted

## 2015-09-28 ENCOUNTER — Ambulatory Visit: Payer: Medicare Other | Admitting: Anesthesiology

## 2015-09-28 ENCOUNTER — Encounter
Admission: RE | Disposition: A | Discharge status: Home / Self Care | Payer: Self-pay | Source: Ambulatory Visit | Attending: Gastroenterology

## 2015-09-28 ENCOUNTER — Encounter: Payer: Self-pay | Admitting: *Deleted

## 2015-09-28 ENCOUNTER — Ambulatory Visit
Admission: RE | Admit: 2015-09-28 | Discharge: 2015-09-28 | Disposition: A | Discharge status: Home / Self Care | Payer: Medicare Other | Source: Ambulatory Visit | Attending: Gastroenterology | Admitting: Gastroenterology

## 2015-09-28 DIAGNOSIS — K5730 Diverticulosis of large intestine without perforation or abscess without bleeding: Secondary | ICD-10-CM | POA: Insufficient documentation

## 2015-09-28 DIAGNOSIS — Z86010 Personal history of colonic polyps: Secondary | ICD-10-CM | POA: Insufficient documentation

## 2015-09-28 DIAGNOSIS — N189 Chronic kidney disease, unspecified: Secondary | ICD-10-CM | POA: Insufficient documentation

## 2015-09-28 DIAGNOSIS — Z6828 Body mass index (BMI) 28.0-28.9, adult: Secondary | ICD-10-CM | POA: Insufficient documentation

## 2015-09-28 DIAGNOSIS — I129 Hypertensive chronic kidney disease with stage 1 through stage 4 chronic kidney disease, or unspecified chronic kidney disease: Secondary | ICD-10-CM | POA: Insufficient documentation

## 2015-09-28 DIAGNOSIS — K219 Gastro-esophageal reflux disease without esophagitis: Secondary | ICD-10-CM | POA: Insufficient documentation

## 2015-09-28 DIAGNOSIS — Z87891 Personal history of nicotine dependence: Secondary | ICD-10-CM | POA: Insufficient documentation

## 2015-09-28 DIAGNOSIS — Z853 Personal history of malignant neoplasm of breast: Secondary | ICD-10-CM | POA: Diagnosis not present

## 2015-09-28 DIAGNOSIS — E785 Hyperlipidemia, unspecified: Secondary | ICD-10-CM | POA: Insufficient documentation

## 2015-09-28 DIAGNOSIS — K625 Hemorrhage of anus and rectum: Secondary | ICD-10-CM | POA: Insufficient documentation

## 2015-09-28 DIAGNOSIS — M1990 Unspecified osteoarthritis, unspecified site: Secondary | ICD-10-CM | POA: Diagnosis not present

## 2015-09-28 DIAGNOSIS — E559 Vitamin D deficiency, unspecified: Secondary | ICD-10-CM | POA: Diagnosis not present

## 2015-09-28 DIAGNOSIS — R634 Abnormal weight loss: Secondary | ICD-10-CM | POA: Diagnosis not present

## 2015-09-28 DIAGNOSIS — K5900 Constipation, unspecified: Secondary | ICD-10-CM | POA: Diagnosis not present

## 2015-09-28 DIAGNOSIS — F410 Panic disorder [episodic paroxysmal anxiety] without agoraphobia: Secondary | ICD-10-CM | POA: Insufficient documentation

## 2015-09-28 DIAGNOSIS — E039 Hypothyroidism, unspecified: Secondary | ICD-10-CM | POA: Insufficient documentation

## 2015-09-28 DIAGNOSIS — R1030 Lower abdominal pain, unspecified: Secondary | ICD-10-CM | POA: Insufficient documentation

## 2015-09-28 DIAGNOSIS — R109 Unspecified abdominal pain: Secondary | ICD-10-CM | POA: Diagnosis not present

## 2015-09-28 DIAGNOSIS — K5790 Diverticulosis of intestine, part unspecified, without perforation or abscess without bleeding: Secondary | ICD-10-CM | POA: Diagnosis not present

## 2015-09-28 HISTORY — DX: Other specified disorders of bone density and structure, unspecified site: M85.80

## 2015-09-28 HISTORY — DX: Vitamin D deficiency, unspecified: E55.9

## 2015-09-28 HISTORY — DX: Anxiety disorder, unspecified: F41.9

## 2015-09-28 HISTORY — DX: Hypothyroidism, unspecified: E03.9

## 2015-09-28 HISTORY — PX: COLONOSCOPY WITH PROPOFOL: SHX5780

## 2015-09-28 LAB — HM COLONOSCOPY

## 2015-09-28 SURGERY — COLONOSCOPY WITH PROPOFOL
Anesthesia: General

## 2015-09-28 MED ORDER — FENTANYL CITRATE (PF) 100 MCG/2ML IJ SOLN
INTRAMUSCULAR | Status: DC | PRN
  Administered 2015-09-28: 50 ug via INTRAVENOUS

## 2015-09-28 MED ORDER — SODIUM CHLORIDE 0.9 % IV SOLN
INTRAVENOUS | Status: DC
  Administered 2015-09-28: 1000 mL via INTRAVENOUS

## 2015-09-28 MED ORDER — PROPOFOL 10 MG/ML IV BOLUS
INTRAVENOUS | Status: DC | PRN
  Administered 2015-09-28: 30 mg via INTRAVENOUS

## 2015-09-28 MED ORDER — MIDAZOLAM HCL 2 MG/2ML IJ SOLN
INTRAMUSCULAR | Status: DC | PRN
  Administered 2015-09-28: 1 mg via INTRAVENOUS

## 2015-09-28 MED ORDER — SODIUM CHLORIDE 0.9 % IV SOLN: INTRAVENOUS | Status: DC

## 2015-09-28 MED ORDER — PROPOFOL 500 MG/50ML IV EMUL
INTRAVENOUS | Status: DC | PRN
  Administered 2015-09-28: 120 ug/kg/min via INTRAVENOUS

## 2015-09-28 NOTE — Anesthesia Preprocedure Evaluation (Signed)
Anesthesia Evaluation  Patient identified by MRN, date of birth, ID band Patient awake    Airway Mallampati: II       Dental  (+) Caps, Teeth Intact   Pulmonary neg pulmonary ROS, former smoker,    Pulmonary exam normal        Cardiovascular Exercise Tolerance: Good hypertension, Pt. on medications  Rhythm:Regular Rate:Normal     Neuro/Psych    GI/Hepatic Neg liver ROS, GERD  ,  Endo/Other  Hypothyroidism   Renal/GU Renal InsufficiencyRenal diseasenegative Renal ROS     Musculoskeletal negative musculoskeletal ROS (+)   Abdominal Normal abdominal exam  (+)   Peds  Hematology negative hematology ROS (+)   Anesthesia Other Findings   Reproductive/Obstetrics                             Anesthesia Physical Anesthesia Plan  ASA: II  Anesthesia Plan: General   Post-op Pain Management:    Induction: Intravenous  Airway Management Planned: Natural Airway and Nasal Cannula  Additional Equipment:   Intra-op Plan:   Post-operative Plan:   Informed Consent: I have reviewed the patients History and Physical, chart, labs and discussed the procedure including the risks, benefits and alternatives for the proposed anesthesia with the patient or authorized representative who has indicated his/her understanding and acceptance.     Plan Discussed with:   Anesthesia Plan Comments:         Anesthesia Quick Evaluation

## 2015-09-28 NOTE — Transfer of Care (Signed)
Immediate Anesthesia Transfer of Care Note  Patient: Lindsey Foster  Procedure(s) Performed: Procedure(s): COLONOSCOPY WITH PROPOFOL (N/A)  Patient Location: PACU  Anesthesia Type:General  Level of Consciousness: awake and alert   Airway & Oxygen Therapy: Patient Spontanous Breathing and Patient connected to nasal cannula oxygen  Post-op Assessment: Report given to RN and Post -op Vital signs reviewed and stable  Post vital signs: Reviewed and stable  Last Vitals:  Filed Vitals:   09/28/15 0801  BP: 162/67  Pulse: 66  Temp: 36.8 C  Resp: 18    Complications: No apparent anesthesia complications

## 2015-09-28 NOTE — H&P (Signed)
Outpatient short stay form Pre-procedure 09/28/2015 8:38 AM Lollie Sails MD  Primary Physician: [facility].  Reason for visit:  Colonoscopy  History of present illness:  Patient is a 70 year old female presenting today for colonoscopy. She had her last colonoscopy about 5 years ago. At that time she showed some diverticulosis. He has had an episode of some rectal bleeding as well as intermittent change of bowel habits with constipation. Her some lower abdominal discomfort/pain.  She tolerated her prep well. Takes no aspirin products or blood thinning agents.    Current facility-administered medications:  .  0.9 %  sodium chloride infusion, , Intravenous, Continuous, Lollie Sails, MD, Last Rate: 20 mL/hr at 09/28/15 0825, 1,000 mL at 09/28/15 0825 .  0.9 %  sodium chloride infusion, , Intravenous, Continuous, Lollie Sails, MD  Prescriptions prior to admission  Medication Sig Dispense Refill Last Dose  . amLODipine (NORVASC) 2.5 MG tablet Take by mouth.   Taking  . Calcium Citrate-Vitamin D3 1000-400 LIQD Take by mouth.   Taking  . clobetasol cream (TEMOVATE) 0.05 % Apply topically 2 (two) times daily. For 2 weeks, then just on week days 60 g 1 Taking  . fluticasone (FLONASE) 50 MCG/ACT nasal spray Place into the nose.   Taking  . Olopatadine HCl 0.2 % SOLN Apply to eye.   Taking     Allergies  Allergen Reactions  . Latex Rash     Past Medical History  Diagnosis Date  . Arthritis   . Blood in stool   . Chickenpox   . Diverticulosis   . GERD (gastroesophageal reflux disease)   . Allergic rhinitis   . Heart murmur   . Hypertension   . Hyperlipidemia   . CKD (chronic kidney disease)   . Colon polyps   . Multiple thyroid nodules   . UTI (lower urinary tract infection)   . Urinary incontinence   . Breast cancer ([place]) 2006    radiation   . Osteopenia   . Vitamin D deficiency disease   . Anxiety     panic attacks in hot rooms  .  Hypothyroidism     Review of systems:      Physical Exam    Heart and lungs: Regular rate and rhythm without rub or gallop, lungs are bilaterally clear.    HEENT: Normocephalic atraumatic eyes are anicteric    Other:     Pertinant exam for procedure: Soft nontender nondistended bowel sounds positive normoactive    Planned proceedures: Anoscopy with indicated procedures. I have discussed the risks benefits and complications of procedures to include not limited to bleeding, infection, perforation and the risk of sedation and the patient wishes to proceed.    Lollie Sails, MD Gastroenterology 09/28/2015  8:38 AM

## 2015-09-28 NOTE — Anesthesia Postprocedure Evaluation (Signed)
Anesthesia Post Note  Patient: Lindsey Foster  Procedure(s) Performed: Procedure(s) (LRB): COLONOSCOPY WITH PROPOFOL (N/A)  Patient location during evaluation: PACU Anesthesia Type: General Level of consciousness: awake and alert Pain management: satisfactory to patient Vital Signs Assessment: post-procedure vital signs reviewed and stable Respiratory status: spontaneous breathing Cardiovascular status: stable Anesthetic complications: no    Last Vitals:  Filed Vitals:   09/28/15 0801  BP: 162/67  Pulse: 66  Temp: 36.8 C  Resp: 18    Last Pain: There were no vitals filed for this visit.               VAN STAVEREN,Allisa Einspahr

## 2015-09-28 NOTE — Op Note (Signed)
[facility] Gastroenterology Patient Name: Lindsey Foster Procedure Date: 09/28/2015 8:40 AM MRN: [id number] Account #: [id number] Date of Birth: 08-05-1946 Admit Type: Outpatient Age: 70 Room: [facility] ENDO ROOM 3 Gender: Female Note Status: Finalized Procedure:         Colonoscopy Indications:       Lower abdominal pain, Rectal bleeding, Weight loss Providers:         Lollie Sails, MD Referring MD:      Randall Hiss g. Caryl Bis (Referring MD) Medicines:         Monitored Anesthesia Care Complications:     No immediate complications. Procedure:         Pre-Anesthesia Assessment:                    - ASA Grade Assessment: II - A patient with mild systemic                     disease.                    After obtaining informed consent, the colonoscope was                     passed under direct vision. Throughout the procedure, the                     patient's blood pressure, pulse, and oxygen saturations                     were monitored continuously. The Colonoscope was                     introduced through the anus and advanced to the the cecum,                     identified by appendiceal orifice and ileocecal valve. The                     quality of the bowel preparation was good. The colonoscopy                     was performed without difficulty. Findings:      Multiple small and large-mouthed diverticula were found in the sigmoid       colon, descending colon, transverse colon and ascending colon.      The retroflexed view of the distal rectum and anal verge was normal and       showed no anal or rectal abnormalities.      The digital rectal exam was normal. Impression:        - Diverticulosis in the sigmoid colon, in the descending                     colon, in the transverse colon and in the ascending colon.                    - The distal rectum and anal verge are normal on                     retroflexion view.                    - No specimens  collected. Recommendation:    - Discharge patient to home.                    -  If further symptoms, consider performing a CT scan of                     the abdomen if symptoms persist.                    - Miralax 1 capful (17 grams) in 8 ounces of water PO                     daily. Procedure Code(s): --- Professional ---                    [phone], Colonoscopy, flexible; diagnostic, including                     collection of specimen(s) by brushing or washing, when                     performed (separate procedure) Diagnosis Code(s): --- Professional ---                    R10.30, Lower abdominal pain, unspecified                    K62.5, Hemorrhage of anus and rectum                    R63.4, Abnormal weight loss                    K57.30, Diverticulosis of large intestine without                     perforation or abscess without bleeding CPT copyright 2016 American Medical Association. All rights reserved. The codes documented in this report are preliminary and upon coder review may  be revised to meet current compliance requirements. Lollie Sails, MD 09/28/2015 9:09:52 AM This report has been signed electronically. Number of Addenda: 0 Note Initiated On: 09/28/2015 8:40 AM Scope Withdrawal Time: 0 hours 11 minutes 14 seconds  Total Procedure Duration: 0 hours 18 minutes 35 seconds       [facility]

## 2015-09-30 ENCOUNTER — Encounter: Payer: Self-pay | Admitting: Gastroenterology

## 2015-10-08 ENCOUNTER — Encounter: Payer: Self-pay | Admitting: Surgical

## 2015-10-23 ENCOUNTER — Telehealth: Payer: Self-pay | Admitting: Family Medicine

## 2015-10-23 NOTE — Telephone Encounter (Signed)
Pt dropped off papers that Dr. Caryl Bis requested. Papers are in Dr. Ellen Henri box.

## 2015-10-26 NOTE — Telephone Encounter (Signed)
noted 

## 2015-10-29 ENCOUNTER — Ambulatory Visit (INDEPENDENT_AMBULATORY_CARE_PROVIDER_SITE_OTHER): Payer: Medicare Other | Admitting: Family Medicine

## 2015-10-29 ENCOUNTER — Encounter: Payer: Self-pay | Admitting: Family Medicine

## 2015-10-29 VITALS — BP 136/88 | HR 68 | Temp 98.3°F | Ht 67.72 in | Wt 185.6 lb

## 2015-10-29 DIAGNOSIS — E785 Hyperlipidemia, unspecified: Secondary | ICD-10-CM | POA: Diagnosis not present

## 2015-10-29 DIAGNOSIS — R252 Cramp and spasm: Secondary | ICD-10-CM | POA: Diagnosis not present

## 2015-10-29 DIAGNOSIS — D72819 Decreased white blood cell count, unspecified: Secondary | ICD-10-CM | POA: Diagnosis not present

## 2015-10-29 DIAGNOSIS — M25662 Stiffness of left knee, not elsewhere classified: Secondary | ICD-10-CM

## 2015-10-29 DIAGNOSIS — I10 Essential (primary) hypertension: Secondary | ICD-10-CM

## 2015-10-29 DIAGNOSIS — R768 Other specified abnormal immunological findings in serum: Secondary | ICD-10-CM | POA: Diagnosis not present

## 2015-10-29 DIAGNOSIS — F419 Anxiety disorder, unspecified: Secondary | ICD-10-CM

## 2015-10-29 LAB — CBC WITH DIFFERENTIAL/PLATELET
BASOS PCT: 0.9 % (ref 0.0–3.0)
Basophils Absolute: 0 10*3/uL (ref 0.0–0.1)
EOS PCT: 2.2 % (ref 0.0–5.0)
Eosinophils Absolute: 0.1 10*3/uL (ref 0.0–0.7)
HCT: 40.4 % (ref 36.0–46.0)
HEMOGLOBIN: 13.2 g/dL (ref 12.0–15.0)
LYMPHS ABS: 1.3 10*3/uL (ref 0.7–4.0)
Lymphocytes Relative: 41 % (ref 12.0–46.0)
MCHC: 32.8 g/dL (ref 30.0–36.0)
MCV: 83.6 fl (ref 78.0–100.0)
MONO ABS: 0.4 10*3/uL (ref 0.1–1.0)
MONOS PCT: 12.4 % — AB (ref 3.0–12.0)
Neutro Abs: 1.4 10*3/uL (ref 1.4–7.7)
Neutrophils Relative %: 43.5 % (ref 43.0–77.0)
Platelets: 208 10*3/uL (ref 150.0–400.0)
RBC: 4.83 Mil/uL (ref 3.87–5.11)
RDW: 15.1 % (ref 11.5–15.5)
WBC: 3.3 10*3/uL — AB (ref 4.0–10.5)

## 2015-10-29 LAB — COMPREHENSIVE METABOLIC PANEL
ALT: 14 U/L (ref 0–35)
AST: 17 U/L (ref 0–37)
Albumin: 4.2 g/dL (ref 3.5–5.2)
Alkaline Phosphatase: 55 U/L (ref 39–117)
BUN: 16 mg/dL (ref 6–23)
CHLORIDE: 104 meq/L (ref 96–112)
CO2: 31 mEq/L (ref 19–32)
Calcium: 9.9 mg/dL (ref 8.4–10.5)
Creatinine, Ser: 1.23 mg/dL — ABNORMAL HIGH (ref 0.40–1.20)
GFR: 55.2 mL/min — ABNORMAL LOW (ref >60.00)
GLUCOSE: 104 mg/dL — AB (ref 70–99)
POTASSIUM: 4.3 meq/L (ref 3.5–5.1)
SODIUM: 140 meq/L (ref 135–145)
Total Bilirubin: 0.4 mg/dL (ref 0.2–1.2)
Total Protein: 7.3 g/dL (ref 6.0–8.3)

## 2015-10-29 LAB — LIPID PANEL
CHOL/HDL RATIO: 3
Cholesterol: 227 mg/dL — ABNORMAL HIGH (ref 0–200)
HDL: 68.3 mg/dL (ref >39.00)
LDL CALC: 148 mg/dL — AB (ref 0–99)
NONHDL: 158.83
Triglycerides: 56 mg/dL (ref 0.0–149.0)
VLDL: 11.2 mg/dL (ref 0.0–40.0)

## 2015-10-29 NOTE — Patient Instructions (Signed)
Nice to see you. Please continue your amlodipine 5 mg daily. We will check and ANA to evaluate for lupus given her prior history of elevated ANA. We'll also check a lipid panel today. Please stay well hydrated with her cramps. Please monitor for recurrence of panic attacks. Please keep follow-up with her cardiologist for evaluation of the palpitations, though these do seem related to her panic attacks. We will check some lab work today. If you develop chest pain, shortness of breath, palpitations, muscle cramps, fever, depression, anxiety, or any new or changing symptoms please seek medical attention.

## 2015-10-29 NOTE — Progress Notes (Signed)
Pre visit review using our clinic review tool, if applicable. No additional management support is needed unless otherwise documented below in the visit note. 

## 2015-10-30 LAB — ANA: ANA: NEGATIVE

## 2015-11-01 DIAGNOSIS — F419 Anxiety disorder, unspecified: Secondary | ICD-10-CM | POA: Insufficient documentation

## 2015-11-01 NOTE — Assessment & Plan Note (Signed)
Previous lab work reviewed will be scanned into the chart. Given prior history of elevated ANA we will recheck this today. Also check CBC with differential.

## 2015-11-01 NOTE — Assessment & Plan Note (Signed)
Very infrequent. Recheck CMP today. Advised to drink plenty of fluids.

## 2015-11-01 NOTE — Assessment & Plan Note (Addendum)
Patient with anxiety and what would appear to be panic attacks. No recent panic attacks. Given palpitations with this she will follow-up with her cardiologist as scheduled. I discussed possible treatment options including medication and therapy. Patient wants to avoid medication at this time. Did provide her with the name of the therapist to call if she was interested. She will continue to monitor. She is given return precautions.

## 2015-11-01 NOTE — Assessment & Plan Note (Signed)
Intermittently elevated at home. We will continue amlodipine at current dosing.

## 2015-11-01 NOTE — Progress Notes (Signed)
Patient ID: Lindsey Foster, female   DOB: February 14, 1946, 70 y.o.   MRN: [id number]  Marikay Alar, MD Phone: [phone]  Lindsey Foster is a 70 y.o. female who presents today for follow-up.  HYPERTENSION Disease Monitoring Home BP Monitoring yes, ranges from 108-166/64-78 Chest pain- no    Dyspnea- no Medications Compliance-  taking amlodipine.  Edema- no  Leukopenia: Patient reports long history of this. Has been evaluated by hematology previously. It felt as though might be hereditary as her brother had similar issues. She brings in lab work to be reviewed. Appears that she may have had a moderately elevated ANA. Also with prior EBV infection. Had negative flow cytometry.  HYPERLIPIDEMIA Symptoms Chest pain on exertion:  No   Leg claudication:   No Not currently on medications. Try and diet and exercise for this. Eating more oatmeal. She is watching her cholesterol and saturated food intake.  Patient notes occasional cramps in her legs that have been much less frequent since she was on vitamin D. She is not on vitamin D supplementation anymore. Very infrequent cramps now. She does drink plenty of fluids.  Anxiety and stress: Patient notes she is stressed out about her granddaughter. There has been some upheaval within her son and daughter-in-law's lives and they're getting a divorce. The granddaughter has had a hard time with this. Patient denies depression. Some anxiety. She feels helpless to do anything for them. She's had some panic attacks at night. She notes this typically happens after she's been anxious and then up doing research on things. She states that her oncologist talked about doing a PET scan and she did some research on this and had a panic attack following this. She had some fast heart rate with this. She gets up and goes walking typically at [facility] and this helps calm her down. No chest pain, shortness of breath, or diaphoresis with this. States she follows up  with cardiology next week. She's not had any panic attacks in the last 2 months. No palpitations in the last 2 months.  Patient notes left knee discomfort as well. Has no meniscal degeneration. She's gotten Synvisc injections previously. Some decreased range of motion and catching. Notes it does not flex as well. She follows with orthopedics for this.  PMH: Former smoker.  ROS see history of present illness  Objective  Physical Exam Filed Vitals:   10/29/15 0951  BP: 136/88  Pulse: 68  Temp: 98.3 F (36.8 C)    BP Readings from Last 3 Encounters:  10/29/15 136/88  09/28/15 138/74  07/30/15 128/72   Wt Readings from Last 3 Encounters:  10/29/15 185 lb 9.6 oz (84.188 kg)  09/28/15 183 lb (83.008 kg)  07/30/15 183 lb 6.4 oz (83.19 kg)    Physical Exam  Constitutional: She is well-developed, well-nourished, and in no distress.  HENT:  Head: Normocephalic and atraumatic.  Right Ear: External ear normal.  Left Ear: External ear normal.  Mouth/Throat: Oropharynx is clear and moist. No oropharyngeal exudate.  Eyes: Pupils are equal, round, and reactive to light.  Cardiovascular: Normal rate, regular rhythm and normal heart sounds.  Exam reveals no gallop and no friction rub.   No murmur heard. Pulmonary/Chest: Effort normal and breath sounds normal. No respiratory distress. She has no wheezes. She has no rales.  Musculoskeletal:  Bilateral knees with no swelling or tenderness, no ligamentous laxity, negative McMurray's  Neurological: She is alert. Gait normal.  5 out of 5 strength bilateral quads, hamstrings, plantar  flexion, and dorsiflexion, sensation light touch intact bilaterally extremities  Skin: Skin is warm and dry. She is not diaphoretic.  Psychiatric: Mood and affect normal.     Assessment/Plan: Please see individual problem list.  Hyperlipidemia Trying diet control. We'll check lipid panel today.  Leukopenia Previous lab work reviewed will be scanned into  the chart. Given prior history of elevated ANA we will recheck this today. Also check CBC with differential.  Muscle cramping Very infrequent. Recheck CMP today. Advised to drink plenty of fluids.  Stiffness of left knee Suspect related to arthritis or meniscal injury. Benign exam today. Patient will continue to monitor. Could consider imaging or referral back to orthopedics for this.  Essential hypertension Intermittently elevated at home. We will continue amlodipine at current dosing.  Anxiety Patient with anxiety and what would appear to be panic attacks. No recent panic attacks. Given palpitations with this she will follow-up with her cardiologist as scheduled. I discussed possible treatment options including medication and therapy. Patient wants to avoid medication at this time. Did provide her with the name of the therapist to call if she was interested. She will continue to monitor. She is given return precautions.    Orders Placed This Encounter  Procedures  . Antinuclear Antib (ANA)  . Lipid Profile  . Comp Met (CMET)  . CBC w/Diff    [name], MD [place]

## 2015-11-01 NOTE — Assessment & Plan Note (Signed)
Suspect related to arthritis or meniscal injury. Benign exam today. Patient will continue to monitor. Could consider imaging or referral back to orthopedics for this.

## 2015-11-01 NOTE — Assessment & Plan Note (Signed)
Trying diet control. We'll check lipid panel today.

## 2015-11-03 DIAGNOSIS — R0602 Shortness of breath: Secondary | ICD-10-CM | POA: Diagnosis not present

## 2015-11-03 DIAGNOSIS — I10 Essential (primary) hypertension: Secondary | ICD-10-CM | POA: Diagnosis not present

## 2015-11-03 DIAGNOSIS — R002 Palpitations: Secondary | ICD-10-CM | POA: Diagnosis not present

## 2015-11-03 DIAGNOSIS — I208 Other forms of angina pectoris: Secondary | ICD-10-CM | POA: Diagnosis not present

## 2015-12-22 DIAGNOSIS — I10 Essential (primary) hypertension: Secondary | ICD-10-CM | POA: Diagnosis not present

## 2015-12-22 DIAGNOSIS — N183 Chronic kidney disease, stage 3 (moderate): Secondary | ICD-10-CM | POA: Diagnosis not present

## 2015-12-22 DIAGNOSIS — E559 Vitamin D deficiency, unspecified: Secondary | ICD-10-CM | POA: Diagnosis not present

## 2016-01-28 ENCOUNTER — Telehealth: Payer: Self-pay

## 2016-01-28 NOTE — Telephone Encounter (Signed)
Called pt.  A message was left by a Venancio Poisson on VM here at cancer center.  The VM had no birth date and the phone number was cut off.  Left VM if it was indeed her calling to please call us back.  She stated in message she would like to know about labs in from March.  Left phone number for pt call back.

## 2016-01-29 ENCOUNTER — Telehealth: Payer: Self-pay

## 2016-01-29 NOTE — Telephone Encounter (Signed)
Returned pt's phone call today.  Pt was concerned about Ca 27.29 and his been out of town since last blood draw.  I gave pt results from last Ca 27.29 and the times before.  Per pt she will call back when she settles down from traveling out of country and call [place] back for an appt before Dec. She is glad that her Ca 27.29 decreased on the re-draw and  Has no other concerns.

## 2016-02-04 ENCOUNTER — Encounter: Payer: Self-pay | Admitting: Family Medicine

## 2016-02-04 ENCOUNTER — Ambulatory Visit (INDEPENDENT_AMBULATORY_CARE_PROVIDER_SITE_OTHER): Payer: Medicare Other | Admitting: Family Medicine

## 2016-02-04 VITALS — BP 124/66 | HR 65 | Temp 98.0°F | Wt 185.6 lb

## 2016-02-04 DIAGNOSIS — E785 Hyperlipidemia, unspecified: Secondary | ICD-10-CM

## 2016-02-04 DIAGNOSIS — Z8639 Personal history of other endocrine, nutritional and metabolic disease: Secondary | ICD-10-CM | POA: Insufficient documentation

## 2016-02-04 DIAGNOSIS — F419 Anxiety disorder, unspecified: Secondary | ICD-10-CM

## 2016-02-04 DIAGNOSIS — R0602 Shortness of breath: Secondary | ICD-10-CM

## 2016-02-04 DIAGNOSIS — E041 Nontoxic single thyroid nodule: Secondary | ICD-10-CM | POA: Diagnosis not present

## 2016-02-04 DIAGNOSIS — I10 Essential (primary) hypertension: Secondary | ICD-10-CM

## 2016-02-04 LAB — LDL CHOLESTEROL, DIRECT: LDL DIRECT: 129 mg/dL

## 2016-02-04 LAB — TSH: TSH: 3.16 u[IU]/mL (ref 0.35–4.50)

## 2016-02-04 NOTE — Assessment & Plan Note (Signed)
At goal on recheck. Continue current medications. Monitor at home.

## 2016-02-04 NOTE — Assessment & Plan Note (Signed)
No recent panic attacks. Continue to monitor.

## 2016-02-04 NOTE — Assessment & Plan Note (Signed)
Had several nodules ablated in the past. Needs follow-up TSH.

## 2016-02-04 NOTE — Patient Instructions (Addendum)
Nice to see you. Please continue to monitor your breathing. If there is any change in this please let us know. We will check some lab work today and call with the results. Please do the exercises below for your knees. If you develop chest pain, shortness of breath, palpitations, anxiety, or near change in symptoms please seek medical attention.  Generic Knee Exercises EXERCISES RANGE OF MOTION (ROM) AND STRETCHING EXERCISES These exercises may help you when beginning to rehabilitate your injury. Your symptoms may resolve with or without further involvement from your physician, physical therapist, or athletic trainer. While completing these exercises, remember:   Restoring tissue flexibility helps normal motion to return to the joints. This allows healthier, less painful movement and activity.  An effective stretch should be held for at least 30 seconds.  A stretch should never be painful. You should only feel a gentle lengthening or release in the stretched tissue. STRETCH - Knee Extension, Prone  Lie on your stomach on a firm surface, such as a bed or countertop. Place your right / left knee and leg just beyond the edge of the surface. You may wish to place a towel under the far end of your right / left thigh for comfort.  Relax your leg muscles and allow gravity to straighten your knee. Your clinician may advise you to add an ankle weight if more resistance is helpful for you.  You should feel a stretch in the back of your right / left knee. Hold this position for __________ seconds. Repeat __________ times. Complete this stretch __________ times per day. * Your physician, physical therapist, or athletic trainer may ask you to add ankle weight to enhance your stretch.  RANGE OF MOTION - Knee Flexion, Active  Lie on your back with both knees straight. (If this causes back discomfort, bend your opposite knee, placing your foot flat on the floor.)  Slowly slide your heel back toward your  buttocks until you feel a gentle stretch in the front of your knee or thigh.  Hold for __________ seconds. Slowly slide your heel back to the starting position. Repeat __________ times. Complete this exercise __________ times per day.  STRETCH - Quadriceps, Prone   Lie on your stomach on a firm surface, such as a bed or padded floor.  Bend your right / left knee and grasp your ankle. If you are unable to reach your ankle or pant leg, use a belt around your foot to lengthen your reach.  Gently pull your heel toward your buttocks. Your knee should not slide out to the side. You should feel a stretch in the front of your thigh and/or knee.  Hold this position for __________ seconds. Repeat __________ times. Complete this stretch __________ times per day.  STRETCH - Hamstrings, Supine   Lie on your back. Loop a belt or towel over the ball of your right / left foot.  Straighten your right / left knee and slowly pull on the belt to raise your leg. Do not allow the right / left knee to bend. Keep your opposite leg flat on the floor.  Raise the leg until you feel a gentle stretch behind your right / left knee or thigh. Hold this position for __________ seconds. Repeat __________ times. Complete this stretch __________ times per day.  STRENGTHENING EXERCISES These exercises may help you when beginning to rehabilitate your injury. They may resolve your symptoms with or without further involvement from your physician, physical therapist, or athletic trainer. While  completing these exercises, remember:   Muscles can gain both the endurance and the strength needed for everyday activities through controlled exercises.  Complete these exercises as instructed by your physician, physical therapist, or athletic trainer. Progress the resistance and repetitions only as guided.  You may experience muscle soreness or fatigue, but the pain or discomfort you are trying to eliminate should never worsen during  these exercises. If this pain does worsen, stop and make certain you are following the directions exactly. If the pain is still present after adjustments, discontinue the exercise until you can discuss the trouble with your clinician. STRENGTH - Quadriceps, Isometrics  Lie on your back with your right / left leg extended and your opposite knee bent.  Gradually tense the muscles in the front of your right / left thigh. You should see either your knee cap slide up toward your hip or increased dimpling just above the knee. This motion will push the back of the knee down toward the floor/mat/bed on which you are lying.  Hold the muscle as tight as you can without increasing your pain for __________ seconds.  Relax the muscles slowly and completely in between each repetition. Repeat __________ times. Complete this exercise __________ times per day.  STRENGTH - Quadriceps, Short Arcs   Lie on your back. Place a __________ inch towel roll under your knee so that the knee slightly bends.  Raise only your lower leg by tightening the muscles in the front of your thigh. Do not allow your thigh to rise.  Hold this position for __________ seconds. Repeat __________ times. Complete this exercise __________ times per day.  OPTIONAL ANKLE WEIGHTS: Begin with ____________________, but DO NOT exceed ____________________. Increase in 1 pound/0.5 kilogram increments.  STRENGTH - Quadriceps, Straight Leg Raises  Quality counts! Watch for signs that the quadriceps muscle is working to insure you are strengthening the correct muscles and not "cheating" by substituting with healthier muscles.  Lay on your back with your right / left leg extended and your opposite knee bent.  Tense the muscles in the front of your right / left thigh. You should see either your knee cap slide up or increased dimpling just above the knee. Your thigh may even quiver.  Tighten these muscles even more and raise your leg 4 to 6 inches  off the floor. Hold for __________ seconds.  Keeping these muscles tense, lower your leg.  Relax the muscles slowly and completely in between each repetition. Repeat __________ times. Complete this exercise __________ times per day.  STRENGTH - Hamstring, Curls  Lay on your stomach with your legs extended. (If you lay on a bed, your feet may hang over the edge.)  Tighten the muscles in the back of your thigh to bend your right / left knee up to 90 degrees. Keep your hips flat on the bed/floor.  Hold this position for __________ seconds.  Slowly lower your leg back to the starting position. Repeat __________ times. Complete this exercise __________ times per day.  OPTIONAL ANKLE WEIGHTS: Begin with ____________________, but DO NOT exceed ____________________. Increase in 1 pound/0.5 kilogram increments.  STRENGTH - Quadriceps, Squats  Stand in a door frame so that your feet and knees are in line with the frame.  Use your hands for balance, not support, on the frame.  Slowly lower your weight, bending at the hips and knees. Keep your lower legs upright so that they are parallel with the door frame. Squat only within the range that  does not increase your knee pain. Never let your hips drop below your knees.  Slowly return upright, pushing with your legs, not pulling with your hands. Repeat __________ times. Complete this exercise __________ times per day.  STRENGTH - Quadriceps, Wall Slides  Follow guidelines for form closely. Increased knee pain often results from poorly placed feet or knees.  Lean against a smooth wall or door and walk your feet out 18-24 inches. Place your feet hip-width apart.  Slowly slide down the wall or door until your knees bend __________ degrees.* Keep your knees over your heels, not your toes, and in line with your hips, not falling to either side.  Hold for __________ seconds. Stand up to rest for __________ seconds in between each repetition. Repeat  __________ times. Complete this exercise __________ times per day. * Your physician, physical therapist, or athletic trainer will alter this angle based on your symptoms and progress.   This information is not intended to replace advice given to you by your health care provider. Make sure you discuss any questions you have with your health care provider.   Document Released: 06/15/2005 Document Revised: 08/22/2014 Document Reviewed: 11/13/2008 Elsevier Interactive Patient Education [employer].

## 2016-02-04 NOTE — Progress Notes (Signed)
Patient ID: Lindsey Foster, female   DOB: 02/24/1946, 70 y.o.   MRN: [id number]:[id number]  [name], MD Phone: [phone]  Lindsey Foster is a 70 y.o. female who presents today for follow-up.  HYPERTENSION Disease Monitoring: Blood pressure range-120s over 70s Chest pain, palpitations- no      Dyspnea- yes, see below Medications: Compliance- taking amlodipine   Edema- no  HYPERLIPIDEMIA Disease Monitoring: See symptoms for Hypertension Currently diet controlled. Exercising 3 days a week. Eating oatmeal and Cheerios in the morning. Sandwiches with [place] at lunch. Fish once a week salads several days a week.  Patient does report for many years (possibly decades) when it is humid outside she has had issues with breathing. Notes it typically only occurs with physical activity when it is humid. She rests and this improves. Does not happen every time she is physically active. Does not occur during the months when it is not humid. Has seen cardiology and had a stress test last year that was low risk. Never been evaluated for asthma. No wheezing. No cough.  Panic attacks: Patient notes she has not had one of these in 6-7 months. No anxiety at this time.  Reports a history of thyroid nodule ablation. Has not had her TSH checked in some time. Not on Synthroid.  PMH: Former smoker for 3-4 years   ROS see history of present illness  Objective  Physical Exam Filed Vitals:   02/04/16 0948 02/04/16 1003  BP: 146/82 124/66  Pulse: 65   Temp: 98 F (36.7 C)     BP Readings from Last 3 Encounters:  02/04/16 124/66  10/29/15 136/88  09/28/15 138/74   Wt Readings from Last 3 Encounters:  02/04/16 185 lb 9.6 oz (84.188 kg)  10/29/15 185 lb 9.6 oz (84.188 kg)  09/28/15 183 lb (83.008 kg)    Physical Exam  Constitutional: She is well-developed, well-nourished, and in no distress.  HENT:  Head: Normocephalic and atraumatic.  Right Ear: External ear normal.  Left Ear:  External ear normal.  Cardiovascular: Normal rate, regular rhythm and normal heart sounds.   Pulmonary/Chest: Effort normal and breath sounds normal.  Neurological: She is alert. Gait normal.  Skin: Skin is warm and dry. She is not diaphoretic.     Assessment/Plan: Please see individual problem list.  Anxiety No recent panic attacks. Continue to monitor.  Essential hypertension At goal on recheck. Continue current medications. Monitor at home.  Hyperlipidemia Trying to manage with diet and exercise. Check LDL today.  Shortness of breath when humid outside Patient with long history of shortness of breath with exertion only when it is humid outside. No chest pain with this. No wheezing with this. No issues when it is not humid outside. Is physically active with no issues with lack of humidity. Stress test last year was unremarkable. Doubt cardiac cause. Vital signs are stable. Discussed potential for reactive airway disease component or COPD. Offered further testing including PFTs or referral or inhaler though she declined and wanted to monitor. She's given return precautions.  History of thyroid nodule Had several nodules ablated in the past. Needs follow-up TSH.    Orders Placed This Encounter  Procedures  . TSH  . Direct LDL    No orders of the defined types were placed in this encounter.    # Healthcare maintenance: Colonoscopy recently completed. Results will be scanned into the computer.   [name], MD [place]

## 2016-02-04 NOTE — Assessment & Plan Note (Signed)
Trying to manage with diet and exercise. Check LDL today.

## 2016-02-04 NOTE — Assessment & Plan Note (Signed)
Patient with long history of shortness of breath with exertion only when it is humid outside. No chest pain with this. No wheezing with this. No issues when it is not humid outside. Is physically active with no issues with lack of humidity. Stress test last year was unremarkable. Doubt cardiac cause. Vital signs are stable. Discussed potential for reactive airway disease component or COPD. Offered further testing including PFTs or referral or inhaler though she declined and wanted to monitor. She's given return precautions.

## 2016-03-23 ENCOUNTER — Other Ambulatory Visit: Payer: Self-pay | Admitting: Hematology and Oncology

## 2016-03-23 DIAGNOSIS — D0510 Intraductal carcinoma in situ of unspecified breast: Secondary | ICD-10-CM

## 2016-03-25 ENCOUNTER — Telehealth: Payer: Self-pay | Admitting: *Deleted

## 2016-03-25 DIAGNOSIS — D0510 Intraductal carcinoma in situ of unspecified breast: Secondary | ICD-10-CM

## 2016-03-25 DIAGNOSIS — R978 Other abnormal tumor markers: Secondary | ICD-10-CM

## 2016-03-25 NOTE — Telephone Encounter (Signed)
Called pt and she was aware of all the ca 27.29 numbers and because her last one was elevated but trending down. She would like her to have another level drawn one day next week. She will come 8/15 at 10 am.  Labs entered

## 2016-03-25 NOTE — Telephone Encounter (Signed)
-----   Message from Lequita Asal, MD sent at 03/23/2016  5:12 PM EDT ----- Regarding: Let's have her lab checked again...  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 I only check in invasive cancer.  Was elevated.  Recheck had improved, but not normal. Wanted to check again, but not wait to her yearly visit. Would draw now (6 months from last test).  M  ----- Message ----- From: Interface, Lab In [place] Sent: 09/23/2015   4:38 AM To: Lequita Asal, MD

## 2016-03-29 ENCOUNTER — Inpatient Hospital Stay: Payer: Medicare Other | Attending: Hematology and Oncology

## 2016-03-29 DIAGNOSIS — R978 Other abnormal tumor markers: Secondary | ICD-10-CM

## 2016-03-29 DIAGNOSIS — Z170 Estrogen receptor positive status [ER+]: Secondary | ICD-10-CM | POA: Insufficient documentation

## 2016-03-29 DIAGNOSIS — Z86000 Personal history of in-situ neoplasm of breast: Secondary | ICD-10-CM | POA: Diagnosis not present

## 2016-03-29 DIAGNOSIS — Z9223 Personal history of estrogen therapy: Secondary | ICD-10-CM | POA: Insufficient documentation

## 2016-03-29 DIAGNOSIS — Z923 Personal history of irradiation: Secondary | ICD-10-CM | POA: Diagnosis not present

## 2016-03-29 DIAGNOSIS — D0510 Intraductal carcinoma in situ of unspecified breast: Secondary | ICD-10-CM

## 2016-03-30 ENCOUNTER — Telehealth: Payer: Self-pay | Admitting: *Deleted

## 2016-03-30 LAB — CANCER ANTIGEN 27.29: CA 27.29: 38.7 U/mL — ABNORMAL HIGH (ref 0.0–38.6)

## 2016-03-30 NOTE — Telephone Encounter (Signed)
Voicemail left with patient regarding results and MD recommendations. Pt instructed to callback if has further questions or new symptoms.

## 2016-03-30 NOTE — Telephone Encounter (Signed)
-----   Message from Lequita Asal, MD sent at 03/30/2016  9:08 AM EDT ----- Regarding: Please call patient  Tumor marker almost normal (38.7; normal goes up to 38.6).  Much better than last check.  No intervention needed unless concerning symptoms.  M  ----- Message ----- From: Interface, Lab In [place] Sent: 03/30/2016   2:36 AM To: Lequita Asal, MD

## 2016-04-26 DIAGNOSIS — L720 Epidermal cyst: Secondary | ICD-10-CM | POA: Diagnosis not present

## 2016-04-26 DIAGNOSIS — L821 Other seborrheic keratosis: Secondary | ICD-10-CM | POA: Diagnosis not present

## 2016-04-26 DIAGNOSIS — D229 Melanocytic nevi, unspecified: Secondary | ICD-10-CM | POA: Diagnosis not present

## 2016-04-26 DIAGNOSIS — D2339 Other benign neoplasm of skin of other parts of face: Secondary | ICD-10-CM | POA: Diagnosis not present

## 2016-04-26 DIAGNOSIS — D485 Neoplasm of uncertain behavior of skin: Secondary | ICD-10-CM | POA: Diagnosis not present

## 2016-04-26 DIAGNOSIS — L300 Nummular dermatitis: Secondary | ICD-10-CM | POA: Diagnosis not present

## 2016-04-26 DIAGNOSIS — L820 Inflamed seborrheic keratosis: Secondary | ICD-10-CM | POA: Diagnosis not present

## 2016-04-28 DIAGNOSIS — R0602 Shortness of breath: Secondary | ICD-10-CM | POA: Diagnosis not present

## 2016-04-28 DIAGNOSIS — I208 Other forms of angina pectoris: Secondary | ICD-10-CM | POA: Diagnosis not present

## 2016-04-28 DIAGNOSIS — I10 Essential (primary) hypertension: Secondary | ICD-10-CM | POA: Diagnosis not present

## 2016-04-28 DIAGNOSIS — R002 Palpitations: Secondary | ICD-10-CM | POA: Diagnosis not present

## 2016-05-12 ENCOUNTER — Ambulatory Visit (INDEPENDENT_AMBULATORY_CARE_PROVIDER_SITE_OTHER): Payer: Medicare Other | Admitting: Family Medicine

## 2016-05-12 ENCOUNTER — Encounter (INDEPENDENT_AMBULATORY_CARE_PROVIDER_SITE_OTHER): Payer: Self-pay

## 2016-05-12 ENCOUNTER — Encounter: Payer: Self-pay | Admitting: Family Medicine

## 2016-05-12 VITALS — BP 116/72 | HR 59 | Temp 97.7°F | Wt 185.0 lb

## 2016-05-12 DIAGNOSIS — I10 Essential (primary) hypertension: Secondary | ICD-10-CM | POA: Diagnosis not present

## 2016-05-12 DIAGNOSIS — D0510 Intraductal carcinoma in situ of unspecified breast: Secondary | ICD-10-CM

## 2016-05-12 DIAGNOSIS — R3589 Other polyuria: Secondary | ICD-10-CM | POA: Insufficient documentation

## 2016-05-12 DIAGNOSIS — R358 Other polyuria: Secondary | ICD-10-CM

## 2016-05-12 DIAGNOSIS — R7309 Other abnormal glucose: Secondary | ICD-10-CM

## 2016-05-12 DIAGNOSIS — E785 Hyperlipidemia, unspecified: Secondary | ICD-10-CM

## 2016-05-12 DIAGNOSIS — K219 Gastro-esophageal reflux disease without esophagitis: Secondary | ICD-10-CM

## 2016-05-12 LAB — COMPREHENSIVE METABOLIC PANEL
ALBUMIN: 4 g/dL (ref 3.5–5.2)
ALT: 15 U/L (ref 0–35)
AST: 17 U/L (ref 0–37)
Alkaline Phosphatase: 50 U/L (ref 39–117)
BUN: 11 mg/dL (ref 6–23)
CALCIUM: 9.2 mg/dL (ref 8.4–10.5)
CHLORIDE: 105 meq/L (ref 96–112)
CO2: 29 mEq/L (ref 19–32)
Creatinine, Ser: 1.11 mg/dL (ref 0.40–1.20)
GFR: 62.05 mL/min (ref >60.00)
Glucose, Bld: 91 mg/dL (ref 70–99)
POTASSIUM: 4 meq/L (ref 3.5–5.1)
SODIUM: 140 meq/L (ref 135–145)
Total Bilirubin: 0.4 mg/dL (ref 0.2–1.2)
Total Protein: 7.2 g/dL (ref 6.0–8.3)

## 2016-05-12 LAB — HEMOGLOBIN A1C: HEMOGLOBIN A1C: 6.2 % (ref 4.6–6.5)

## 2016-05-12 LAB — LDL CHOLESTEROL, DIRECT: LDL DIRECT: 122 mg/dL

## 2016-05-12 MED ORDER — RANITIDINE HCL 150 MG PO TABS: 150.0000 mg | ORAL_TABLET | Freq: Two times a day (BID) | ORAL | 1 refills | Status: DC

## 2016-05-12 NOTE — Assessment & Plan Note (Signed)
At goal. Continue current medication. 

## 2016-05-12 NOTE — Assessment & Plan Note (Signed)
Recheck LDL today. Continue diet and exercise.

## 2016-05-12 NOTE — Assessment & Plan Note (Signed)
Followed by oncology. She'll keep her mammogram appointment in December.

## 2016-05-12 NOTE — Patient Instructions (Addendum)
Nice to see you. We will start you on Zantac to help with likely reflux. We will check labs today and call you with the results. Please keep your scheduled appointment for mammogram. If you develop chest pain, shortness of breath, sweatiness, radiation of the pain, or any new or changing symptoms please seek medical attention.

## 2016-05-12 NOTE — Progress Notes (Signed)
[name], MD Phone: [phone]  Lindsey Foster is a 69 y.o. female who presents today for follow-up.  HYPERTENSION Disease Monitoring: Blood pressure range-well-controlled       Chest pain- atypical, see below      Dyspnea- no Medications: Compliance- taking amlodipine   Edema- no  HYPERLIPIDEMIA Disease Monitoring: See symptoms for Hypertension Diet controlled. Working on exercise 3 days a week. Did a lot of yard work over the summer. Eating lots of oatmeal or Cheerios. Also lots of vegetables.  Patient notes that infrequently she gets a discomfort over her right costochondral joints that feels like she has swallowed and something is stuck. This can occur when she's not eating. Can last for a few hours up to a day. Does get some scratchy throat at night and has had reflux previously. No exertional component. No shortness of breath or diaphoresis associated with it. It does not radiate. She had a negative stress echo last fall. Has taken Zantac for similar symptoms in the past with good benefit.   DCIS: Patient had surgery in 2006. She is followed by oncology. She was on tamoxifen for 5 years. Has a mammogram scheduled in December of this year.  Patient reports history of dry mouth. Notes increased water intake recently. Also some polyuria. Blood sugar was 104 on last check.  PMH: Former smoker   ROS see history of present illness  Objective  Physical Exam Vitals:   05/12/16 1007  BP: 116/72  Pulse: (!) 59  Temp: 97.7 F (36.5 C)    BP Readings from Last 3 Encounters:  05/12/16 116/72  02/04/16 124/66  10/29/15 136/88   Wt Readings from Last 3 Encounters:  05/12/16 185 lb (83.9 kg)  02/04/16 185 lb 9.6 oz (84.2 kg)  10/29/15 185 lb 9.6 oz (84.2 kg)    Physical Exam  Constitutional: She is well-developed, well-nourished, and in no distress.  HENT:  Head: Normocephalic and atraumatic.  Cardiovascular: Normal rate, regular rhythm and normal heart  sounds.   Pulmonary/Chest: Effort normal and breath sounds normal.  Abdominal: Soft. Bowel sounds are normal. She exhibits no distension. There is no tenderness. There is no rebound and no guarding.  Musculoskeletal: She exhibits no edema.  Neurological: She is alert. Gait normal.  Skin: Skin is warm and dry.     Assessment/Plan: Please see individual problem list.  Essential hypertension At goal. Continue current medication.  GERD (gastroesophageal reflux disease) Patient with continued infrequent symptoms likely related to GERD. No typical cardiac symptoms. Vital signs are stable. No discomfort at this time. Has responded to Zantac in the past. Prior negative stress echo within the last year. We will try Zantac again. She is given return precautions.  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 She'll keep her mammogram appointment in December.  Hyperlipidemia Recheck LDL today. Continue diet and exercise.  Polyuria Patient notes increased thirst and some polyuria. We will check an A1c to rule out diabetes. Potentially increased thirst could be related to dry mouth as she reports significant dry mouth and the polyuria could be due to the increased water intake due to the dry mouth. She'll monitor.   Orders Placed This Encounter  Procedures  . Comp Met (CMET)  . Direct LDL  . HgB A1c    Meds ordered this encounter  Medications  . ranitidine (ZANTAC) 150 MG tablet    Sig: Take 1 tablet (150 mg total) by mouth 2 (two) times daily.    Dispense:  60  tablet    Refill:  1   [name], MD [place]

## 2016-05-12 NOTE — Progress Notes (Signed)
Pre visit review using our clinic review tool, if applicable. No additional management support is needed unless otherwise documented below in the visit note. 

## 2016-05-12 NOTE — Assessment & Plan Note (Signed)
Patient notes increased thirst and some polyuria. We will check an A1c to rule out diabetes. Potentially increased thirst could be related to dry mouth as she reports significant dry mouth and the polyuria could be due to the increased water intake due to the dry mouth. She'll monitor.

## 2016-05-12 NOTE — Assessment & Plan Note (Signed)
Patient with continued infrequent symptoms likely related to GERD. No typical cardiac symptoms. Vital signs are stable. No discomfort at this time. Has responded to Zantac in the past. Prior negative stress echo within the last year. We will try Zantac again. She is given return precautions.

## 2016-05-17 ENCOUNTER — Telehealth: Payer: Self-pay | Admitting: *Deleted

## 2016-05-17 NOTE — Telephone Encounter (Signed)
Pt requested lab results  Pt contact [phone]

## 2016-05-17 NOTE — Telephone Encounter (Signed)
Patient advised of labs.

## 2016-06-29 DIAGNOSIS — E559 Vitamin D deficiency, unspecified: Secondary | ICD-10-CM | POA: Diagnosis not present

## 2016-06-29 DIAGNOSIS — I10 Essential (primary) hypertension: Secondary | ICD-10-CM | POA: Diagnosis not present

## 2016-06-29 DIAGNOSIS — N183 Chronic kidney disease, stage 3 (moderate): Secondary | ICD-10-CM | POA: Diagnosis not present

## 2016-07-21 ENCOUNTER — Other Ambulatory Visit: Payer: Self-pay | Admitting: Hematology and Oncology

## 2016-07-21 ENCOUNTER — Ambulatory Visit
Admission: RE | Admit: 2016-07-21 | Discharge: 2016-07-21 | Disposition: A | Discharge status: Home / Self Care | Payer: Medicare Other | Source: Ambulatory Visit | Attending: Hematology and Oncology | Admitting: Hematology and Oncology

## 2016-07-21 DIAGNOSIS — Z853 Personal history of malignant neoplasm of breast: Secondary | ICD-10-CM

## 2016-07-21 DIAGNOSIS — Z1231 Encounter for screening mammogram for malignant neoplasm of breast: Secondary | ICD-10-CM | POA: Insufficient documentation

## 2016-07-21 DIAGNOSIS — Z1239 Encounter for other screening for malignant neoplasm of breast: Secondary | ICD-10-CM

## 2016-07-21 HISTORY — DX: Personal history of irradiation: Z92.3

## 2016-07-23 NOTE — Progress Notes (Signed)
[facility] day:  07/25/2016   Chief Complaint: Lindsey Foster is a 70 y.o. 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  At that time, she was doing well.  Exam was unremarkable.  CA27.29 was 43.9 (elevated).  She was contacted about recheck and PET scan if persistently elevated.  CA27.29 was 54.3 on 08/21/2015, 46.2 on 09/22/2015, and 38.7 on 03/29/2016.  Bilateral screening mammogram on 07/21/2016 revealed no evidence of malignancy.  Symptomatically, she feels very good.  She denies any complaint.   Past Medical History:  Diagnosis Date  . Allergic rhinitis   . Anxiety    panic attacks in hot rooms  . Arthritis   . Blood in stool   . Breast cancer ([place]) 2006   radiation   . Chickenpox   . CKD (chronic kidney disease)   . Colon polyps   . Diverticulosis   . GERD (gastroesophageal reflux disease)   . Heart murmur   . Hyperlipidemia   . Hypertension   . Hypothyroidism   . Multiple thyroid nodules   . Osteopenia   . Personal history of radiation therapy   . Urinary incontinence   . UTI (lower urinary tract infection)   . Vitamin D deficiency disease     Past Surgical History:  Procedure Laterality Date  . BLADDER SURGERY    . BREAST EXCISIONAL BIOPSY Right 2006   positive  . 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 CESAREAN SECTION  1985  . COLONOSCOPY WITH PROPOFOL N/A 09/28/2015   Procedure: COLONOSCOPY WITH PROPOFOL;  Surgeon: Lollie Sails, MD;  Location: [facility] ENDOSCOPY;  Service: Endoscopy;  Laterality: N/A;  . DILATION AND CURETTAGE OF UTERUS  2012  . Thyroid nodule ablation  2003  . Uterine polyp removal  2009     Family History  Problem Relation Age of Onset  . Heart disease      Parent  . Kidney disease      Parent  . Uterine cancer Maternal Aunt   . Breast cancer Neg Hx     Social History:   reports that she has quit smoking. She has never used smokeless tobacco. She reports that she does not drink alcohol or use drugs.  She is originally from [place].  The patient is alone today.  Allergies:  Allergies  Allergen Reactions  . Latex Rash    Current Medications: Current Outpatient Prescriptions  Medication Sig Dispense Refill  . amLODipine (NORVASC) 2.5 MG tablet Take 5 mg by mouth.     . Calcium Citrate-Vitamin D3 1000-400 LIQD Take by mouth. Reported on 02/04/2016    . clobetasol cream (TEMOVATE) 0.05 % Apply topically 2 (two) times daily. For 2 weeks, then just on week days 60 g 1  . fluticasone (FLONASE) 50 MCG/ACT nasal spray Place into the nose.    Marland Kitchen Olopatadine HCl 0.2 % SOLN Apply to eye.    . ranitidine (ZANTAC) 150 MG tablet Take 1 tablet (150 mg total) by mouth 2 (two) times daily. 60 tablet 1   No current facility-administered medications for this visit.     Review of Systems:  GENERAL:  Feels very good.  Active.  No fevers or sweats.  Weight up 5 pounds. PERFORMANCE STATUS (ECOG):  1 HEENT:  No visual changes, runny nose, sore throat, mouth sores or  tenderness. Lungs: No shortness of breath or cough.  No hemoptysis. Cardiac:  No chest pain, palpitations, orthopnea, or PND. GI:  Trouble swallowing pills (always).  No nausea, vomiting, diarrhea, constipation, melena or hematochezia. GU:  No urgency, frequency, dysuria, or hematuria. Musculoskeletal:  No back pain.  Knee quirkey.  Arthritis.  No muscle tenderness. Extremities:  No pain or swelling. Skin:  No rashes or skin changes. Neuro:  No headache, numbness or weakness, balance or coordination issues. Endocrine:  No diabetes, thyroid issues, hot flashes or night sweats. Psych:  No mood changes, depression or anxiety. Pain:  No focal pain. Review of systems:  All other systems reviewed and found to be negative.  Physical Exam: Blood pressure 136/80, pulse (!) 58, temperature 97.5 F (36.4 C),  temperature source Tympanic, resp. rate 18, weight 179 lb 7.3 oz (81.4 kg). GENERAL:  Well developed, well nourished, woman sitting comfortably in the exam room in no acute distress. MENTAL STATUS:  Alert and oriented to person, place and time. HEAD:  Brown hair.  Normocephalic, atraumatic, face symmetric, no Cushingoid features. EYES:  Glasses.  Brown eyes.  Pupils equal round and reactive to light and accomodation.  No conjunctivitis or scleral icterus. ENT:  Oropharynx clear without lesion.  Tongue normal. Mucous membranes moist.  RESPIRATORY:  Clear to auscultation without rales, wheezes or rhonchi. CARDIOVASCULAR:  Regular rate and rhythm without murmur, rub or gallop. BREASTS:  Right breast with inferior scarring.  No discrete masses, skin changes or nipple discharge.  Left breast without masses, skin changes or nipple discharge.  Moderate fibrocystic changes. ABDOMEN:  Soft, non-tender, with active bowel sounds, and no hepatosplenomegaly.  No masses. SKIN:  No rashes, ulcers or lesions. EXTREMITIES: No edema, no skin discoloration or tenderness.  No palpable cords. LYMPH NODES: No palpable cervical, supraclavicular, axillary or inguinal adenopathy  NEUROLOGICAL: Unremarkable. PSYCH:  Appropriate.   Appointment on 07/25/2016  Component Date Value Ref Range Status  . WBC 07/25/2016 3.2* 3.6 - 11.0 K/uL Final  . RBC 07/25/2016 4.64  3.80 - 5.20 MIL/uL Final  . Hemoglobin 07/25/2016 12.7  12.0 - 16.0 g/dL Final  . HCT 07/25/2016 38.8  35.0 - 47.0 % Final  . MCV 07/25/2016 83.4  80.0 - 100.0 fL Final  . MCH 07/25/2016 27.4  26.0 - 34.0 pg Final  . MCHC 07/25/2016 32.9  32.0 - 36.0 g/dL Final  . RDW 07/25/2016 14.4  11.5 - 14.5 % Final  . Platelets 07/25/2016 191  150 - 440 K/uL Final  . Neutrophils Relative % 07/25/2016 47  % Final  . Neutro Abs 07/25/2016 1.5  1.4 - 6.5 K/uL Final  . Lymphocytes Relative 07/25/2016 37  % Final  . Lymphs Abs 07/25/2016 1.2  1.0 - 3.6 K/uL Final  .  Monocytes Relative 07/25/2016 12  % Final  . Monocytes Absolute 07/25/2016 0.4  0.2 - 0.9 K/uL Final  . Eosinophils Relative 07/25/2016 3  % Final  . Eosinophils Absolute 07/25/2016 0.1  0 - 0.7 K/uL Final  . Basophils Relative 07/25/2016 1  % Final  . Basophils Absolute 07/25/2016 0.0  0 - 0.1 K/uL Final  . Sodium 07/25/2016 138  135 - 145 mmol/L Final  . Potassium 07/25/2016 4.1  3.5 - 5.1 mmol/L Final  . Chloride 07/25/2016 104  101 - 111 mmol/L Final  . CO2 07/25/2016 28  22 - 32 mmol/L Final  . Glucose, Bld 07/25/2016 99  65 - 99 mg/dL Final  . BUN 07/25/2016 20  6 - 20 mg/dL Final  . Creatinine, Ser 07/25/2016 1.14* 0.44 - 1.00 mg/dL Final  . Calcium 07/25/2016 9.2  8.9 - 10.3 mg/dL Final  . Total Protein 07/25/2016 7.5  6.5 - 8.1 g/dL Final  . Albumin 07/25/2016 4.2  3.5 - 5.0 g/dL Final  . AST 07/25/2016 19  15 - 41 U/L Final  . ALT 07/25/2016 17  14 - 54 U/L Final  . Alkaline Phosphatase 07/25/2016 54  38 - 126 U/L Final  . Total Bilirubin 07/25/2016 0.5  0.3 - 1.2 mg/dL Final  . GFR calc non Af Amer 07/25/2016 47* >60 mL/min Final  . GFR calc Af Amer 07/25/2016 54* >60 mL/min Final   Comment: (NOTE) The eGFR has been calculated using the CKD EPI equation. This calculation has not been validated in all clinical situations. eGFR's persistently <60 mL/min signify possible Chronic Kidney Disease.   Georgiann Hahn gap 07/25/2016 6  5 - 15 Final    Assessment:  Rya Rausch is a 70 y.o. 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  She received radiation to the right breast from 08/2005 - 09/2005 at the [facility] in [place].  She then received tamoxifen for 5 years (12/2005-11/2010).  Bilateral screening mammogram on 07/21/2016 revealed no evidence of malignancy.    CA27.29 has been followed:  37.9 on 07/04/2014, 43.9 on 07/24/2015, 54.3 on 08/21/2015, 46.2 on 09/22/2015, 38.7 on 03/29/2016, and 37.8 (0-38.6) on  07/25/2016.  She has progressive osteoporosis.  Bone density study on 12/11/2012 revealed a T score of -2.3 in the left femur.  Bone density study on 07/17/2014 revealed a T-score of -3.0 in the left femoral neck and -1.8 in the L1-L4 spine.  Bone density study on 07/20/2015 revealed osteoporosis with a T score of -3.1 in the left hip.    She previously developed GI symptoms on Fosamax.  She declined Zometa and Prolia.   Symptomatically, she is doing well.  Exam is unremarkable.  Plan: 1. Labs today:  CBC with diff, CMP, CA27.29. 2. Copy of labs for patient. 3. Discuss osteoporosis.  Encouraged calcium 1200 mg and vitamin D 800 IU a day.  Discuss Prolia.  Encourage ongoing discussions with PCP. 4. Schedule mammogram 07/21/2017. 5. RTC in 1 year for MD assessment, labs (CBC with diff, CMP), and review of mammogram.   Lequita Asal, MD  07/25/2016, 11:30 AM

## 2016-07-25 ENCOUNTER — Encounter: Payer: Self-pay | Admitting: Hematology and Oncology

## 2016-07-25 ENCOUNTER — Inpatient Hospital Stay: Payer: Medicare Other | Attending: Hematology and Oncology

## 2016-07-25 ENCOUNTER — Inpatient Hospital Stay (HOSPITAL_BASED_OUTPATIENT_CLINIC_OR_DEPARTMENT_OTHER): Payer: Medicare Other | Admitting: Hematology and Oncology

## 2016-07-25 VITALS — BP 136/80 | HR 58 | Temp 97.5°F | Resp 18 | Wt 179.5 lb

## 2016-07-25 DIAGNOSIS — Z170 Estrogen receptor positive status [ER+]: Secondary | ICD-10-CM | POA: Insufficient documentation

## 2016-07-25 DIAGNOSIS — I129 Hypertensive chronic kidney disease with stage 1 through stage 4 chronic kidney disease, or unspecified chronic kidney disease: Secondary | ICD-10-CM | POA: Insufficient documentation

## 2016-07-25 DIAGNOSIS — N189 Chronic kidney disease, unspecified: Secondary | ICD-10-CM | POA: Insufficient documentation

## 2016-07-25 DIAGNOSIS — Z79899 Other long term (current) drug therapy: Secondary | ICD-10-CM | POA: Diagnosis not present

## 2016-07-25 DIAGNOSIS — Z86000 Personal history of in-situ neoplasm of breast: Secondary | ICD-10-CM

## 2016-07-25 DIAGNOSIS — K219 Gastro-esophageal reflux disease without esophagitis: Secondary | ICD-10-CM | POA: Insufficient documentation

## 2016-07-25 DIAGNOSIS — E785 Hyperlipidemia, unspecified: Secondary | ICD-10-CM

## 2016-07-25 DIAGNOSIS — M810 Age-related osteoporosis without current pathological fracture: Secondary | ICD-10-CM

## 2016-07-25 DIAGNOSIS — M1990 Unspecified osteoarthritis, unspecified site: Secondary | ICD-10-CM | POA: Diagnosis not present

## 2016-07-25 DIAGNOSIS — D0511 Intraductal carcinoma in situ of right breast: Secondary | ICD-10-CM

## 2016-07-25 DIAGNOSIS — E039 Hypothyroidism, unspecified: Secondary | ICD-10-CM | POA: Diagnosis not present

## 2016-07-25 DIAGNOSIS — Z9223 Personal history of estrogen therapy: Secondary | ICD-10-CM | POA: Diagnosis not present

## 2016-07-25 DIAGNOSIS — Z923 Personal history of irradiation: Secondary | ICD-10-CM

## 2016-07-25 DIAGNOSIS — Z87891 Personal history of nicotine dependence: Secondary | ICD-10-CM | POA: Diagnosis not present

## 2016-07-25 LAB — COMPREHENSIVE METABOLIC PANEL
ALT: 17 U/L (ref 14–54)
AST: 19 U/L (ref 15–41)
Albumin: 4.2 g/dL (ref 3.5–5.0)
Alkaline Phosphatase: 54 U/L (ref 38–126)
Anion gap: 6 (ref 5–15)
BUN: 20 mg/dL (ref 6–20)
CO2: 28 mmol/L (ref 22–32)
Calcium: 9.2 mg/dL (ref 8.9–10.3)
Chloride: 104 mmol/L (ref 101–111)
Creatinine, Ser: 1.14 mg/dL — ABNORMAL HIGH (ref 0.44–1.00)
GFR calc Af Amer: 54 mL/min — ABNORMAL LOW (ref >60)
GFR calc non Af Amer: 47 mL/min — ABNORMAL LOW (ref >60)
Glucose, Bld: 99 mg/dL (ref 65–99)
Potassium: 4.1 mmol/L (ref 3.5–5.1)
Sodium: 138 mmol/L (ref 135–145)
Total Bilirubin: 0.5 mg/dL (ref 0.3–1.2)
Total Protein: 7.5 g/dL (ref 6.5–8.1)

## 2016-07-25 LAB — CBC WITH DIFFERENTIAL/PLATELET
Basophils Absolute: 0 10*3/uL (ref 0–0.1)
Basophils Relative: 1 %
Eosinophils Absolute: 0.1 10*3/uL (ref 0–0.7)
Eosinophils Relative: 3 %
HCT: 38.8 % (ref 35.0–47.0)
Hemoglobin: 12.7 g/dL (ref 12.0–16.0)
Lymphocytes Relative: 37 %
Lymphs Abs: 1.2 10*3/uL (ref 1.0–3.6)
MCH: 27.4 pg (ref 26.0–34.0)
MCHC: 32.9 g/dL (ref 32.0–36.0)
MCV: 83.4 fL (ref 80.0–100.0)
Monocytes Absolute: 0.4 10*3/uL (ref 0.2–0.9)
Monocytes Relative: 12 %
Neutro Abs: 1.5 10*3/uL (ref 1.4–6.5)
Neutrophils Relative %: 47 %
Platelets: 191 10*3/uL (ref 150–440)
RBC: 4.64 MIL/uL (ref 3.80–5.20)
RDW: 14.4 % (ref 11.5–14.5)
WBC: 3.2 10*3/uL — ABNORMAL LOW (ref 3.6–11.0)

## 2016-07-25 NOTE — Progress Notes (Signed)
No new diagnosis since last visit.   Patient concerned about CA27.29.  States it has fluctuated the last couple of times.

## 2016-07-26 ENCOUNTER — Telehealth: Payer: Self-pay | Admitting: *Deleted

## 2016-07-26 LAB — CANCER ANTIGEN 27.29: CA 27.29: 37.8 U/mL (ref 0.0–38.6)

## 2016-07-26 NOTE — Telephone Encounter (Signed)
-----   Message from Lequita Asal, MD sent at 07/26/2016  3:21 AM EST ----- Regarding: Please call patient  Tumor marker normal   ----- Message ----- From: Interface, Lab In [place] Sent: 07/25/2016  10:38 AM To: Lequita Asal, MD

## 2016-07-26 NOTE — Telephone Encounter (Signed)
Called patient to inform her that her tumor marker is normal. 

## 2016-08-11 ENCOUNTER — Ambulatory Visit (INDEPENDENT_AMBULATORY_CARE_PROVIDER_SITE_OTHER): Payer: Medicare Other | Admitting: Family Medicine

## 2016-08-11 ENCOUNTER — Encounter: Payer: Self-pay | Admitting: Family Medicine

## 2016-08-11 VITALS — BP 130/82 | HR 66 | Temp 98.4°F | Wt 182.2 lb

## 2016-08-11 DIAGNOSIS — M25561 Pain in right knee: Secondary | ICD-10-CM | POA: Diagnosis not present

## 2016-08-11 DIAGNOSIS — E785 Hyperlipidemia, unspecified: Secondary | ICD-10-CM

## 2016-08-11 DIAGNOSIS — M25562 Pain in left knee: Secondary | ICD-10-CM

## 2016-08-11 DIAGNOSIS — I10 Essential (primary) hypertension: Secondary | ICD-10-CM

## 2016-08-11 DIAGNOSIS — R634 Abnormal weight loss: Secondary | ICD-10-CM | POA: Diagnosis not present

## 2016-08-11 DIAGNOSIS — J309 Allergic rhinitis, unspecified: Secondary | ICD-10-CM

## 2016-08-11 DIAGNOSIS — M25662 Stiffness of left knee, not elsewhere classified: Secondary | ICD-10-CM

## 2016-08-11 DIAGNOSIS — G8929 Other chronic pain: Secondary | ICD-10-CM

## 2016-08-11 DIAGNOSIS — E663 Overweight: Secondary | ICD-10-CM

## 2016-08-11 LAB — LIPID PANEL
CHOL/HDL RATIO: 3
Cholesterol: 242 mg/dL — ABNORMAL HIGH (ref 0–200)
HDL: 79.5 mg/dL (ref >39.00)
LDL Cholesterol: 152 mg/dL — ABNORMAL HIGH (ref 0–99)
NONHDL: 162.37
TRIGLYCERIDES: 50 mg/dL (ref 0.0–149.0)
VLDL: 10 mg/dL (ref 0.0–40.0)

## 2016-08-11 LAB — COMPREHENSIVE METABOLIC PANEL
ALK PHOS: 53 U/L (ref 39–117)
ALT: 17 U/L (ref 0–35)
AST: 14 U/L (ref 0–37)
Albumin: 4.3 g/dL (ref 3.5–5.2)
BILIRUBIN TOTAL: 0.5 mg/dL (ref 0.2–1.2)
BUN: 19 mg/dL (ref 6–23)
CALCIUM: 9.8 mg/dL (ref 8.4–10.5)
CO2: 33 meq/L — AB (ref 19–32)
CREATININE: 1.18 mg/dL (ref 0.40–1.20)
Chloride: 103 mEq/L (ref 96–112)
GFR: 57.79 mL/min — AB (ref >60.00)
GLUCOSE: 92 mg/dL (ref 70–99)
Potassium: 4.4 mEq/L (ref 3.5–5.1)
Sodium: 140 mEq/L (ref 135–145)
Total Protein: 7 g/dL (ref 6.0–8.3)

## 2016-08-11 LAB — CBC
HCT: 37.9 % (ref 36.0–46.0)
HEMOGLOBIN: 12.7 g/dL (ref 12.0–15.0)
MCHC: 33.6 g/dL (ref 30.0–36.0)
MCV: 84.2 fl (ref 78.0–100.0)
PLATELETS: 190 10*3/uL (ref 150.0–400.0)
RBC: 4.5 Mil/uL (ref 3.87–5.11)
RDW: 14.9 % (ref 11.5–15.5)
WBC: 3.8 10*3/uL — AB (ref 4.0–10.5)

## 2016-08-11 LAB — SEDIMENTATION RATE: Sed Rate: 7 mm/hr (ref 0–30)

## 2016-08-11 LAB — TSH: TSH: 2.79 u[IU]/mL (ref 0.35–4.50)

## 2016-08-11 MED ORDER — LORATADINE 10 MG PO TABS: 10.0000 mg | ORAL_TABLET | Freq: Every day | ORAL | 11 refills | Status: DC

## 2016-08-11 MED ORDER — AMLODIPINE BESYLATE 5 MG PO TABS: 5.0000 mg | ORAL_TABLET | Freq: Every day | ORAL | 3 refills | Status: DC

## 2016-08-11 NOTE — Progress Notes (Signed)
Pre visit review using our clinic review tool, if applicable. No additional management support is needed unless otherwise documented below in the visit note. 

## 2016-08-11 NOTE — Patient Instructions (Addendum)
Nice to see you. We'll start her on Claritin for possible allergies. We will refer you to orthopedics for your knees. We'll check some basic lab work to evaluate your weight issues though your weight has been stable over the last year. A refill of your amlodipine was sent to the pharmacy.

## 2016-08-12 DIAGNOSIS — J309 Allergic rhinitis, unspecified: Secondary | ICD-10-CM | POA: Insufficient documentation

## 2016-08-12 DIAGNOSIS — E663 Overweight: Secondary | ICD-10-CM | POA: Insufficient documentation

## 2016-08-12 NOTE — Assessment & Plan Note (Signed)
Patient continues to have issues with her left knee. There is decreased flexion range of motion bringing in to  question some meniscal type pathology. We will refer her to orthopedic surgery in [facility] for further evaluation.

## 2016-08-12 NOTE — Assessment & Plan Note (Signed)
Not really working that much on diet and exercise. We'll check a lipid panel today and likely continue to recommend cholesterol medication.

## 2016-08-12 NOTE — Assessment & Plan Note (Signed)
Suspect upper respiratory symptoms likely related to allergic rhinitis. We'll start her on Claritin and see if this is beneficial.

## 2016-08-12 NOTE — Assessment & Plan Note (Signed)
At goal. Continue current medications. 

## 2016-08-12 NOTE — Assessment & Plan Note (Signed)
Patient is overweight. I discussed that her weight has been stable over the last several years with apparent fluctuations between upper 170s and mid 180s. We will obtain some basic lab work given her concern for the weight loss. It appears that she is up-to-date on screening for cancer. I overall reassured her that she has not really lost any significant amount of weight and that we will continue to monitor. Discussed diet and exercise with her as well.

## 2016-08-12 NOTE — Progress Notes (Signed)
[name], MD Phone: [phone]  Lindsey Foster is a 70 y.o. female who presents today for f/u.  HYPERTENSION  Disease Monitoring  Home BP Monitoring 130/70 Chest pain- no    Dyspnea- no Medications  Compliance-  taking amlodipine.  Edema- no  HYPERLIPIDEMIA Symptoms Chest pain on exertion:  No   Leg claudication:   No Patient is trying to work on diet and exercise. She's doing some walking though not much else. Diet was bad while she was on a cruise though she has been trying to eat more oatmeal and Cheerios.  Patient notes allergic rhinitis and occasionally having mucus in the back of her throat. Some congestion with this. Some rhinorrhea. Does not use Flonase very consistently as it dries her nose out.  Osteoarthritis of the knees: Patient notes left greater than right. Has some limited flexion of her left knee. Notes her left knee will bother her when she is sitting down. Previously saw orthopedics and they said she had some kind of meniscal issue. She wants to see a different orthopedist.  Patient reports what she feels is unexplained weight loss. On review of her weights over the last year she is stable from this time last year. Notes her weight will jump up and down several pounds at a time. She was more active over the summer. She is up-to-date on breast cancer screening. Up-to-date on colonoscopy and has no blood in her stool. She occasionally notes some mild perspiration around her neck at night though no drenching night sweats.   PMH: Former smoker   ROS see history of present illness  Objective  Physical Exam Vitals:   08/11/16 1052  BP: 130/82  Pulse: 66  Temp: 98.4 F (36.9 C)    BP Readings from Last 3 Encounters:  08/11/16 130/82  07/25/16 136/80  05/12/16 116/72   Wt Readings from Last 3 Encounters:  08/11/16 182 lb 3.2 oz (82.6 kg)  07/25/16 179 lb 7.3 oz (81.4 kg)  05/12/16 185 lb (83.9 kg)    Physical Exam  Constitutional: No  distress.  HENT:  Head: Normocephalic and atraumatic.  Mouth/Throat: Oropharynx is clear and moist. No oropharyngeal exudate.  Eyes: Conjunctivae are normal. Pupils are equal, round, and reactive to light.  Cardiovascular: Normal rate, regular rhythm and normal heart sounds.   Pulmonary/Chest: Effort normal and breath sounds normal.  Musculoskeletal: She exhibits no edema.  Left knee with no joint line tenderness, bony tenderness, soft tissue tenderness, warmth, swelling, or erythema, no ligamentous laxity, negative McMurray's, there is limited flexion to 90, right knee with no joint line tenderness, bony tenderness, soft tissue tenderness, warmth, swelling, or erythema, no ligamentous laxity, negative McMurray's  Neurological: She is alert. Gait normal.  Skin: Skin is warm and dry. She is not diaphoretic.     Assessment/Plan: Please see individual problem list.  Essential hypertension At goal. Continue current medications.  Hyperlipidemia Not really working that much on diet and exercise. We'll check a lipid panel today and likely continue to recommend cholesterol medication.  Stiffness of left knee Patient continues to have issues with her left knee. There is decreased flexion range of motion bringing in to  question some meniscal type pathology. We will refer her to orthopedic surgery in [facility] for further evaluation.  Allergic rhinitis Suspect upper respiratory symptoms likely related to allergic rhinitis. We'll start her on Claritin and see if this is beneficial.  Overweight Patient is overweight. I discussed that her weight has been stable over the  last several years with apparent fluctuations between upper 170s and mid 180s. We will obtain some basic lab work given her concern for the weight loss. It appears that she is up-to-date on screening for cancer. I overall reassured her that she has not really lost any significant amount of weight and that we will continue to  monitor. Discussed diet and exercise with her as well.   Orders Placed This Encounter  Procedures  . TSH  . CBC  . Comp Met (CMET)  . Lipid Profile  . Sed Rate (ESR)  . Ambulatory referral to Orthopedic Surgery    Referral Priority:   Routine    Referral Type:   Surgical    Referral Reason:   Specialty Services Required    Requested Specialty:   Orthopedic Surgery    Number of Visits Requested:   1    Meds ordered this encounter  Medications  . amLODipine (NORVASC) 5 MG tablet    Sig: Take 1 tablet (5 mg total) by mouth daily.    Dispense:  90 tablet    Refill:  3  . loratadine (CLARITIN) 10 MG tablet    Sig: Take 1 tablet (10 mg total) by mouth daily.    Dispense:  30 tablet    Refill:  [place], MD [place]

## 2016-08-23 DIAGNOSIS — M25561 Pain in right knee: Secondary | ICD-10-CM | POA: Diagnosis not present

## 2016-08-23 DIAGNOSIS — M25562 Pain in left knee: Secondary | ICD-10-CM | POA: Diagnosis not present

## 2016-10-07 DIAGNOSIS — H2513 Age-related nuclear cataract, bilateral: Secondary | ICD-10-CM | POA: Diagnosis not present

## 2016-10-13 ENCOUNTER — Encounter: Payer: Self-pay | Admitting: Hematology and Oncology

## 2016-10-26 DIAGNOSIS — I208 Other forms of angina pectoris: Secondary | ICD-10-CM | POA: Diagnosis not present

## 2016-10-26 DIAGNOSIS — I10 Essential (primary) hypertension: Secondary | ICD-10-CM | POA: Diagnosis not present

## 2016-10-26 DIAGNOSIS — R0602 Shortness of breath: Secondary | ICD-10-CM | POA: Diagnosis not present

## 2016-10-26 DIAGNOSIS — R002 Palpitations: Secondary | ICD-10-CM | POA: Diagnosis not present

## 2016-11-09 ENCOUNTER — Ambulatory Visit: Payer: Medicare Other | Admitting: Family Medicine

## 2016-11-10 ENCOUNTER — Ambulatory Visit: Payer: Medicare Other | Admitting: Family Medicine

## 2016-11-24 DIAGNOSIS — M2041 Other hammer toe(s) (acquired), right foot: Secondary | ICD-10-CM | POA: Diagnosis not present

## 2016-11-24 DIAGNOSIS — M898X9 Other specified disorders of bone, unspecified site: Secondary | ICD-10-CM | POA: Diagnosis not present

## 2016-11-24 DIAGNOSIS — M7751 Other enthesopathy of right foot: Secondary | ICD-10-CM | POA: Diagnosis not present

## 2016-12-05 DIAGNOSIS — L708 Other acne: Secondary | ICD-10-CM | POA: Diagnosis not present

## 2016-12-05 DIAGNOSIS — L720 Epidermal cyst: Secondary | ICD-10-CM | POA: Diagnosis not present

## 2016-12-06 ENCOUNTER — Ambulatory Visit: Payer: Medicare Other | Admitting: Family Medicine

## 2016-12-12 DIAGNOSIS — L720 Epidermal cyst: Secondary | ICD-10-CM | POA: Diagnosis not present

## 2016-12-13 ENCOUNTER — Ambulatory Visit (INDEPENDENT_AMBULATORY_CARE_PROVIDER_SITE_OTHER): Payer: Medicare Other | Admitting: Family Medicine

## 2016-12-13 ENCOUNTER — Encounter: Payer: Self-pay | Admitting: Family Medicine

## 2016-12-13 VITALS — BP 138/78 | HR 74 | Temp 98.9°F | Wt 189.0 lb

## 2016-12-13 DIAGNOSIS — K219 Gastro-esophageal reflux disease without esophagitis: Secondary | ICD-10-CM | POA: Diagnosis not present

## 2016-12-13 DIAGNOSIS — I10 Essential (primary) hypertension: Secondary | ICD-10-CM

## 2016-12-13 DIAGNOSIS — E785 Hyperlipidemia, unspecified: Secondary | ICD-10-CM | POA: Diagnosis not present

## 2016-12-13 NOTE — Assessment & Plan Note (Signed)
Working on diet and exercise. Check direct LDL.

## 2016-12-13 NOTE — Progress Notes (Signed)
Pre visit review using our clinic review tool, if applicable. No additional management support is needed unless otherwise documented below in the visit note. 

## 2016-12-13 NOTE — Assessment & Plan Note (Signed)
At goal for age. Continue current medication. 

## 2016-12-13 NOTE — Patient Instructions (Signed)
Nice to see you. We will check your LDL cholesterol. We'll contact you with the results. Please continue to work on diet and exercise. Please monitor your blood pressure.

## 2016-12-13 NOTE — Progress Notes (Signed)
  [name], MD Phone: [phone]  Lindsey Foster is a 71 y.o. female who presents today for follow-up.  HYPERTENSION  Disease Monitoring  Home BP Monitoring 130/70  Chest pain- no    Dyspnea- no Medications  Compliance-  Taking amlodipine.  Edema- no  HLD: Has been trying to work on diet and exercise. She is back to doing water aerobics 3 times a week. She is eating a fair number of oatmeal based foods. Also try to eat more vegetables. Does still have a sweet tooth and eats a fair amount of pasta.  GERD: No reflux symptoms. No abdominal pain. No blood in her stool. Does not take any medicine for this.  PMH: former smoker   ROS see history of present illness  Objective  Physical Exam Vitals:   12/13/16 1612  BP: 138/78  Pulse: 74  Temp: 98.9 F (37.2 C)    BP Readings from Last 3 Encounters:  12/13/16 138/78  08/11/16 130/82  07/25/16 136/80   Wt Readings from Last 3 Encounters:  12/13/16 189 lb (85.7 kg)  08/11/16 182 lb 3.2 oz (82.6 kg)  07/25/16 179 lb 7.3 oz (81.4 kg)    Physical Exam  Constitutional: She is well-developed, well-nourished, and in no distress.  Cardiovascular: Normal rate, regular rhythm and normal heart sounds.   Pulmonary/Chest: Effort normal and breath sounds normal.  Musculoskeletal: She exhibits no edema.  Neurological: She is alert. Gait normal.  Skin: Skin is warm and dry.     Assessment/Plan: Please see individual problem list.  Essential hypertension At goal for age. Continue current medication.   GERD (gastroesophageal reflux disease) Well controlled. Monitor for recurrence.  Hyperlipidemia Working on diet and exercise. Check direct LDL.    Orders Placed This Encounter  Procedures  . Direct LDL    [name], MD [place]

## 2016-12-13 NOTE — Assessment & Plan Note (Signed)
Well controlled. Monitor for recurrence.

## 2016-12-14 LAB — LDL CHOLESTEROL, DIRECT: Direct LDL: 119 mg/dL

## 2016-12-26 ENCOUNTER — Telehealth: Payer: Self-pay | Admitting: Family Medicine

## 2016-12-26 NOTE — Telephone Encounter (Signed)
Left pt message asking to call Allison back directly at [phone] to schedule AWV. Thanks! °

## 2017-01-03 DIAGNOSIS — N2581 Secondary hyperparathyroidism of renal origin: Secondary | ICD-10-CM | POA: Diagnosis not present

## 2017-01-03 DIAGNOSIS — N183 Chronic kidney disease, stage 3 (moderate): Secondary | ICD-10-CM | POA: Diagnosis not present

## 2017-01-03 DIAGNOSIS — I10 Essential (primary) hypertension: Secondary | ICD-10-CM | POA: Diagnosis not present

## 2017-01-13 ENCOUNTER — Ambulatory Visit: Payer: Medicare Other | Admitting: Family Medicine

## 2017-01-23 NOTE — Telephone Encounter (Signed)
Left pt message asking to call Allison back directly at [phone] to schedule AWV. Thanks! °

## 2017-02-07 ENCOUNTER — Ambulatory Visit (INDEPENDENT_AMBULATORY_CARE_PROVIDER_SITE_OTHER): Payer: Medicare Other

## 2017-02-07 VITALS — BP 126/80 | HR 61 | Temp 98.5°F | Resp 14 | Ht 68.0 in | Wt 184.1 lb

## 2017-02-07 DIAGNOSIS — Z0000 Encounter for general adult medical examination without abnormal findings: Secondary | ICD-10-CM

## 2017-02-07 NOTE — Progress Notes (Signed)
Subjective:   Lindsey Foster is a 71 y.o. female who presents for an Initial Medicare Annual Wellness Visit.  Review of Systems    No ROS.  Medicare Wellness Visit. Additional risk factors are reflected in the social history.  Cardiac Risk Factors include: advanced age (>55mn, >>67women);hypertension     Objective:    Today's Vitals   02/07/17 0921  BP: 126/80  Pulse: 61  Resp: 14  Temp: 98.5 F (36.9 C)  TempSrc: Oral  SpO2: 97%  Weight: 184 lb 1.9 oz (83.5 kg)  Height: '5\' 8"'$  (1.727 m)   Body mass index is 28 kg/m.   Current Medications (verified) Outpatient Encounter Prescriptions as of 02/07/2017  Medication Sig  . amLODipine (NORVASC) 5 MG tablet Take 1 tablet (5 mg total) by mouth daily.  . Calcium Citrate-Vitamin D3 1000-400 LIQD Take by mouth. Reported on 02/04/2016  . clobetasol cream (TEMOVATE) 0.05 % Apply topically 2 (two) times daily. For 2 weeks, then just on week days  . fluticasone (FLONASE) 50 MCG/ACT nasal spray Place into the nose.  . loratadine (CLARITIN) 10 MG tablet Take 1 tablet (10 mg total) by mouth daily.  . Olopatadine HCl 0.2 % SOLN Apply to eye.  . ranitidine (ZANTAC) 150 MG tablet Take 1 tablet (150 mg total) by mouth 2 (two) times daily.   No facility-administered encounter medications on file as of 02/07/2017.     Allergies (verified) Latex   History: Past Medical History:  Diagnosis Date  . Allergic rhinitis   . Anxiety    panic attacks in hot rooms  . Arthritis   . Blood in stool   . Breast cancer [place]   radiation   . Chickenpox   . CKD (chronic kidney disease)   . Colon polyps   . Diverticulosis   . GERD (gastroesophageal reflux disease)   . Heart murmur   . Hyperlipidemia   . Hypertension   . Hypothyroidism   . Multiple thyroid nodules   . Osteopenia   . Personal history of radiation therapy   . Urinary incontinence   . UTI (lower urinary tract infection)   . Vitamin D deficiency disease    Past  Surgical History:  Procedure Laterality Date  . BLADDER SURGERY    . BREAST EXCISIONAL BIOPSY Right 2006   positive  . 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 CESAREAN SECTION  1985  . COLONOSCOPY WITH PROPOFOL N/A 09/28/2015   Procedure: COLONOSCOPY WITH PROPOFOL;  Surgeon: MLollie Sails MD;  [facility];  Service: Endoscopy;  Laterality: N/A;  . DILATION AND CURETTAGE OF UTERUS  2012  . Thyroid nodule ablation  2003  . Uterine polyp removal  2009    Family History  Problem Relation Age of Onset  . Heart disease Unknown        Parent  . Kidney disease Unknown        Parent  . Uterine cancer Maternal Aunt   . Kidney disease Mother   . Angina Father   . Heart attack Father   . Multiple myeloma Brother   . Breast cancer Neg Hx    Social History   Occupational History  . Not on file.   Social History Main Topics  . Smoking status: Former SResearch scientist (life sciences) . Smokeless tobacco: Never Used  . Alcohol use No  . Drug use: No  . Sexual activity: Not on file    Tobacco Counseling Counseling given: Not Answered  Activities of Daily Living In your present state of health, do you have any difficulty performing the following activities: 02/07/2017  Hearing? Y  Vision? N  Difficulty concentrating or making decisions? N  Walking or climbing stairs? Y  Dressing or bathing? N  Doing errands, shopping? N  Preparing Food and eating ? N  Using the Toilet? N  In the past six months, have you accidently leaked urine? N  Do you have problems with loss of bowel control? N  Managing your Medications? N  Managing your Finances? N  Housekeeping or managing your Housekeeping? N  Some recent data might be hidden    Immunizations and Health Maintenance  There is no immunization history on file for this patient. Health Maintenance Due  Topic Date Due  . Hepatitis C Screening  Apr 01, 1946  . TETANUS/TDAP  11/17/1962    Patient Care Team: [name], MD as PCP - General  (Family Medicine)  Indicate any recent Medical Services you may have received from other than [facility] providers in the past year (date may be approximate).     Assessment:   This is a routine wellness examination for [name]. The goal of the wellness visit is to assist the patient how to close the gaps in care and create a preventative care plan for the patient.   The roster of all physicians providing medical care to patient is listed in the Snapshot section of the chart.  Taking calcium VIT D as appropriate/Osteoporosis reviewed.    Safety issues reviewed; Smoke and carbon monoxide detectors in the home. No firearms in the home. Wears seatbelts when driving or riding with others. Patient does wear sunscreen or protective clothing when in direct sunlight. No violence in the home.  Patient is alert, normal appearance, oriented to person/place/and time.  Correctly identified the president of the [place], recall of 3/3 words, and performing simple calculations. Displays appropriate judgement and can read correct time from watch face.   No new identified risk were noted.  No failures at ADL's or IADL's.   BMI- discussed the importance of a healthy diet, water intake and the benefits of aerobic exercise. Educational material provided.   Daily fluid intake: 0 cups of caffeine, 6 cups of water  Dental- every 6 months.  Sleep patterns- Sleeps 6-7 hours at night.  Wakes feeling rested.  Naps during the day as needed.  TDAP vaccine deferred per patient preference.  Follow up with insurance.  Educational material provided.  Hepatitis C Screening discussed.   Educational material provided.  C50.919 Breast Cancer, female-stable and followed by Dr. Nolon Stalls.  Patient Concerns: None at this time. Follow up with PCP as needed.  Hearing/Vision screen Hearing Screening Comments: Patient does have difficulty hearing conversational tones. Audiology testing several years ago. She does not  wear a hearing aid per preference. Vision Screening Comments: Followed by [facility]  Wears corrective lenses Last OV 10/2016 Visual acuity not assessed per patient preference since they have regular follow up with the ophthalmologist  Dietary issues and exercise activities discussed: Current Exercise Habits: Structured exercise class, Time (Minutes): 60, Frequency (Times/Week): 2, Weekly Exercise (Minutes/Week): 120, Intensity: Moderate  Goals    . Healthy Lifestyle          Low carb foods Stay active and continue exercise regimen Stay hydrated and drink plenty of fluids      Depression Screen PHQ 2/9 Scores 12/13/2016 08/11/2016  PHQ - 2 Score 0 0  PHQ- 9 Score 0 -  Fall Risk Fall Risk  02/07/2017 08/11/2016  Falls in the past year? No No    Cognitive Function: MMSE - Mini Mental State Exam 02/07/2017  Orientation to time 5  Orientation to Place 5  Registration 3  Attention/ Calculation 5  Recall 3  Language- name 2 objects 2  Language- repeat 1  Language- follow 3 step command 3  Language- read & follow direction 1  Write a sentence 1  Copy design 1  Total score 30        Screening Tests Health Maintenance  Topic Date Due  . Hepatitis C Screening  09/25/1945  . TETANUS/TDAP  11/17/1962  . PNA vac Low Risk Adult (1 of 2 - PCV13) 01/11/2019 (Originally 11/16/2008)  . INFLUENZA VACCINE  03/15/2017  . MAMMOGRAM  07/21/2018  . COLONOSCOPY  09/27/2020  . DEXA SCAN  Completed      Plan:    End of life planning; Advance aging; Advanced directives discussed. Copy of current HCPOA/Living Will requested.    I have personally reviewed and noted the following in the patient's chart:   . Medical and social history . Use of alcohol, tobacco or illicit drugs  . Current medications and supplements . Functional ability and status . Nutritional status . Physical activity . Advanced directives . List of other physicians . Hospitalizations, surgeries, and  ER visits in previous 12 months . Vitals . Screenings to include cognitive, depression, and falls . Referrals and appointments  In addition, I have reviewed and discussed with patient certain preventive protocols, quality metrics, and best practice recommendations. A written personalized care plan for preventive services as well as general preventive health recommendations were provided to patient.     Varney Biles, LPN   3/81/8299

## 2017-02-07 NOTE — Patient Instructions (Addendum)
  Lindsey Foster , Thank you for taking time to come for your Medicare Wellness Visit. I appreciate your ongoing commitment to your health goals. Please review the following plan we discussed and let me know if I can assist you in the future.   Follow up with Dr. Caryl Bis as needed.    Bring a copy of your [place] and/or Living Will to be scanned into chart.  Have a great day!  These are the goals we discussed: Goals    . Healthy Lifestyle          Low carb foods Stay active and continue exercise regimen Stay hydrated and drink plenty of fluids       This is a list of the screening recommended for you and due dates:  Health Maintenance  Topic Date Due  .  Hepatitis C: One time screening is recommended by Center for Disease Control  (CDC) for  adults born from [birth year] through 1967.   Oct 09, 1945  . Tetanus Vaccine  11/17/1962  . Pneumonia vaccines (1 of 2 - PCV13) 01/11/2019*  . Flu Shot  03/15/2017  . Mammogram  07/21/2018  . Colon Cancer Screening  09/27/2020  . DEXA scan (bone density measurement)  Completed  *Topic was postponed. The date shown is not the original due date.

## 2017-03-16 ENCOUNTER — Encounter: Payer: Self-pay | Admitting: Family Medicine

## 2017-03-16 ENCOUNTER — Telehealth: Payer: Self-pay

## 2017-03-16 ENCOUNTER — Ambulatory Visit (INDEPENDENT_AMBULATORY_CARE_PROVIDER_SITE_OTHER): Payer: Medicare Other | Admitting: Family Medicine

## 2017-03-16 VITALS — BP 128/80 | HR 61 | Temp 98.8°F | Wt 186.2 lb

## 2017-03-16 DIAGNOSIS — R21 Rash and other nonspecific skin eruption: Secondary | ICD-10-CM | POA: Diagnosis not present

## 2017-03-16 DIAGNOSIS — L309 Dermatitis, unspecified: Secondary | ICD-10-CM | POA: Diagnosis not present

## 2017-03-16 DIAGNOSIS — I10 Essential (primary) hypertension: Secondary | ICD-10-CM | POA: Diagnosis not present

## 2017-03-16 DIAGNOSIS — N3941 Urge incontinence: Secondary | ICD-10-CM | POA: Diagnosis not present

## 2017-03-16 DIAGNOSIS — E7800 Pure hypercholesterolemia, unspecified: Secondary | ICD-10-CM | POA: Diagnosis not present

## 2017-03-16 LAB — POCT URINALYSIS DIPSTICK
BILIRUBIN UA: NEGATIVE
GLUCOSE UA: NEGATIVE
Ketones, UA: NEGATIVE
Leukocytes, UA: NEGATIVE
NITRITE UA: NEGATIVE
Protein, UA: NEGATIVE
Spec Grav, UA: 1.015 (ref 1.010–1.025)
Urobilinogen, UA: 0.2 E.U./dL
pH, UA: 7 (ref 5.0–8.0)

## 2017-03-16 LAB — LDL CHOLESTEROL, DIRECT: Direct LDL: 122 mg/dL

## 2017-03-16 LAB — URINALYSIS, MICROSCOPIC ONLY

## 2017-03-16 MED ORDER — NYSTATIN 100000 UNIT/GM EX CREA: 1.0000 "application " | TOPICAL_CREAM | Freq: Two times a day (BID) | CUTANEOUS | 0 refills | Status: DC

## 2017-03-16 MED ORDER — CLOBETASOL PROPIONATE 0.05 % EX CREA: TOPICAL_CREAM | Freq: Two times a day (BID) | CUTANEOUS | 1 refills | Status: DC

## 2017-03-16 NOTE — Patient Instructions (Signed)
Nice to see. Please discontinue the amlodipine. If you blood pressure starts running consistently greater than 140/90 please let us know. We'll refill your creams. Please try the nystatin on the rash on your tailbone. We'll check your urine as well.

## 2017-03-16 NOTE — Assessment & Plan Note (Signed)
Refilled clobetasol. We will have the [place] check with the pharmacy to see if there are any topical ear drops to try given the mometasone previously was not terribly beneficial.

## 2017-03-16 NOTE — Telephone Encounter (Signed)
Called [facility] and they do not have an ear drop that is steroid only. Patient notified and notified patient to follow up with her ENT doctor.

## 2017-03-16 NOTE — Addendum Note (Signed)
Addended by: Arby Barrette on: 03/16/2017 01:39 PM   Modules accepted: Orders

## 2017-03-16 NOTE — Assessment & Plan Note (Signed)
Patient with mild urge and stress incontinence. We'll check a UA today. Could consider urology referral depending on UA results.

## 2017-03-16 NOTE — Progress Notes (Signed)
  [name], MD Phone: [phone]  Lindsey Foster is a 71 y.o. female who presents today for f/u.  HYPERTENSION  Disease Monitoring  Home BP Monitoring 120s/70s Chest pain- no    Dyspnea- no Medications  Compliance-  Taking amlodipine.  Edema- no  Eczema: she gets this on her legs and in her ears. She saw ENT at some point in the past and they advised using mometasone drops. Also using clobetasol cream. Had a biopsy in the past revealing eczema on her legs. Notes these treatments are somewhat beneficial.  She does note occasional rash near her tailbone. Notes the area does feel consistently moist. She's also been going to saltwater pool. She's tried tea tree oil. It does itch. She is unsure if her urine aggravates it.  She does note some urge incontinence and stress incontinence. She'll get up and sometimes just can't quite get to the bathroom quickly enough. No dysuria. Does note occasional urinary frequency.  PMH: Former smoker   ROS see history of present illness  Objective  Physical Exam Vitals:   03/16/17 1023  BP: 128/80  Pulse: 61  Temp: 98.8 F (37.1 C)    BP Readings from Last 3 Encounters:  03/16/17 128/80  02/07/17 126/80  12/13/16 138/78   Wt Readings from Last 3 Encounters:  03/16/17 186 lb 3.2 oz (84.5 kg)  02/07/17 184 lb 1.9 oz (83.5 kg)  12/13/16 189 lb (85.7 kg)    Physical Exam  Constitutional: No distress.  Cardiovascular: Normal rate, regular rhythm and normal heart sounds.   Pulmonary/Chest: Effort normal and breath sounds normal.  Skin: She is not diaphoretic.  Several small slightly hyperpigmented patches on right lower leg, bilateral ear canals appear normal with slight cerumen, at the end of her gluteal cleft there is slight irritation, no tenderness or induration     Assessment/Plan: Please see individual problem list.  Essential hypertension At goal. She wants to discontinue amlodipine. She is given parameters to call  back with and she is discontinuing amlodipine.  Eczema Refilled clobetasol. We will have the CMA check with the pharmacy to see if there are any topical ear drops to try given the mometasone previously was not terribly beneficial.  Rash Patient with slight rash in inferior aspect of gluteal cleft. Could be irritation from moisture versus a mild case of intertrigo versus her eczema. We'll have her try nystatin. If not improving she'll let us know.  Urge incontinence Patient with mild urge and stress incontinence. We'll check a UA today. Could consider urology referral depending on UA results.   Orders Placed This Encounter  Procedures  . Direct LDL  . POCT Urinalysis Dipstick    Meds ordered this encounter  Medications  . DISCONTD: mometasone (ELOCON) 0.1 % lotion    Sig: daily.   Marland Kitchen nystatin cream (MYCOSTATIN)    Sig: Apply 1 application topically 2 (two) times daily.    Dispense:  30 g    Refill:  0  . clobetasol cream (TEMOVATE) 0.05 %    Sig: Apply topically 2 (two) times daily. For 2 weeks, then just on week days    Dispense:  60 g    Refill:  [place], MD [place]

## 2017-03-16 NOTE — Assessment & Plan Note (Signed)
At goal. She wants to discontinue amlodipine. She is given parameters to call back with and she is discontinuing amlodipine.

## 2017-03-16 NOTE — Assessment & Plan Note (Signed)
Patient with slight rash in inferior aspect of gluteal cleft. Could be irritation from moisture versus a mild case of intertrigo versus her eczema. We'll have her try nystatin. If not improving she'll let us know.

## 2017-03-17 LAB — URINE CULTURE: ORGANISM ID, BACTERIA: NO GROWTH

## 2017-04-26 DIAGNOSIS — R0602 Shortness of breath: Secondary | ICD-10-CM | POA: Diagnosis not present

## 2017-04-26 DIAGNOSIS — I10 Essential (primary) hypertension: Secondary | ICD-10-CM | POA: Diagnosis not present

## 2017-04-26 DIAGNOSIS — I208 Other forms of angina pectoris: Secondary | ICD-10-CM | POA: Diagnosis not present

## 2017-04-27 ENCOUNTER — Telehealth: Payer: Self-pay | Admitting: Family Medicine

## 2017-04-27 NOTE — Telephone Encounter (Signed)
Patient scheduled for 05/01/2017 and is aware of appointment date/time.

## 2017-04-27 NOTE — Telephone Encounter (Signed)
Please advise 

## 2017-04-27 NOTE — Telephone Encounter (Signed)
Patient Name: Lindsey Foster  DOB: [birth year]    Initial Comment Caller states, Office call- pt. is having questions about bp rx. - pt is having chest tightness. Verified    Nurse Assessment  Nurse: Raphael Gibney, RN, Vanita Ingles Date/Time (Eastern Time): 04/27/2017 9:53:02 AM  Confirm and document reason for call. If symptomatic, describe symptoms. ---Caller states she was in office on Sept 2 and doctor stopped amlodipine. Dr. Biagio Quint said if BP was consistently 140/80-90. BP 161/88 today. Has a little tightness in her chest since Monday or Tuesday. Has a very slight burning in her throat.  Does the patient have any new or worsening symptoms? ---Yes  Will a triage be completed? ---Yes  Related visit to physician within the last 2 weeks? ---No  Does the PT have any chronic conditions? (i.e. diabetes, asthma, etc.) ---Yes  List chronic conditions. ---HTN  Is this a behavioral health or substance abuse call? ---No     Guidelines    Guideline Title Affirmed Question Affirmed Notes  Chest Pain Chest pain lasts > 5 minutes (Exceptions: chest pain occurring > 3 days ago and now asymptomatic; same as previously diagnosed heartburn and has accompanying sour taste in mouth)    Final Disposition User   Go to ED Now (or PCP triage) Raphael Gibney, RN, [place]    Comments  No appts available within 4 hrs to see Dr. Caryl Bis. Pt does not want to see another doctor or go to urgent care or ER. She wants to know about restarting her amlodipine and if she can come in to have BP checked.  called back line and spoke to [place] and gave report that pt is having mild chest tightness that has been going on since Tuesday. Dr. Caryl Bis stopped her amlodipine last week and said to call if her BP elevated. BP 161/88 today. No SOB. Triage outcome of go to ER now (or pCP triage). no appts available within 4 hrs to see Dr. Caryl Bis and pt does not want to see anyone else. Also wants to know if she can come in and someone check her  BP.  Dr. Caryl Bis said to call if BP was consistently above 140/80-90.   Referrals  GO TO FACILITY REFUSED   Disagree/Comply: Disagree  Disagree/Comply Reason: Disagree with instructions

## 2017-04-27 NOTE — Telephone Encounter (Signed)
She likely would need blood pressure medication again though we could recheck her in the office first. If she is having any sensation of chest discomfort I would suggest evaluation today for that issue. Thanks.

## 2017-04-27 NOTE — Telephone Encounter (Signed)
Pt called about her BP going up today pt BP 161/88 pulse 76. Yesterday it was 146/80 at 6:15 pm and 158/82 and 165/94 midnight. Pt wants to know if she should be put back on the BP medication? Pt is having some chest tightness. Pt was transferred to [facility].

## 2017-04-27 NOTE — Telephone Encounter (Signed)
Pt is requesting to come in to have her blood pressure checked -per [facility] nurse Pt contact [phone]

## 2017-04-27 NOTE — Telephone Encounter (Signed)
Patient states that she is not really having chest tightness, she feels like she needs to burp and when she does she feels better. She thinks that is coming from indigestion. BP was 161/88 this morning and has been running in the 150's and 160's over 80-90 the last 3 days. Patient stated that she was just making you aware as she was told to do so at the last visit. Patient was previously on Amlodipine 5 mg. Patient was wondering if she should start taking again.

## 2017-04-27 NOTE — Telephone Encounter (Signed)
fyi

## 2017-05-01 ENCOUNTER — Ambulatory Visit (INDEPENDENT_AMBULATORY_CARE_PROVIDER_SITE_OTHER): Payer: Medicare Other | Admitting: Family Medicine

## 2017-05-01 ENCOUNTER — Encounter: Payer: Self-pay | Admitting: Family Medicine

## 2017-05-01 DIAGNOSIS — R449 Unspecified symptoms and signs involving general sensations and perceptions: Secondary | ICD-10-CM | POA: Diagnosis not present

## 2017-05-01 DIAGNOSIS — F419 Anxiety disorder, unspecified: Secondary | ICD-10-CM | POA: Diagnosis not present

## 2017-05-01 DIAGNOSIS — I10 Essential (primary) hypertension: Secondary | ICD-10-CM

## 2017-05-01 MED ORDER — AMLODIPINE BESYLATE 5 MG PO TABS: 5.0000 mg | ORAL_TABLET | Freq: Every day | ORAL | 3 refills | Status: DC

## 2017-05-01 NOTE — Assessment & Plan Note (Signed)
Above goal. She started back on amlodipine several days ago. Discussed it may take a number of days for this to start to work. She'll monitor and let us know what her blood pressures running about a week from now.

## 2017-05-01 NOTE — Patient Instructions (Signed)
Nice to see you. Please try taking Zantac 1 pill twice daily 30 minutes prior to breakfast and dinner. See if this helps with any reflux symptoms.  If you would like to pursue therapy or medication for your anxiety please let us know. Please check your blood pressure daily at home and contact us in one week with the readings. If you develop chest pressure, shortness of breath, or any new or changing symptoms please seek medical attention.

## 2017-05-01 NOTE — Progress Notes (Signed)
[name], MD Phone: [phone]  Lindsey Foster is a 71 y.o. female who presents today for follow-up.  Hypertension: Patient reports she came off of the amlodipine and her blood pressure did well for a period of time where it was typically in the 120s-130s/70's-80s. Over the last week or so it's been back up in the 150s over 80s. She notes no chest pain or shortness of breath. Does note a slight dull headache with her blood pressure being up. That has improved with starting back on her amlodipine. She saw her cardiologist last week who does not appear to have started her back on her medication and the patient did this on her own. They recommended a low-salt diet.  She also discussed with her cardiologist an atypical right-sided chest sensation that she's been having. She states it is not a pain. She notes it is not a pressure. It doesn't feel like a muscle ache. It is not there all the time. She's had no swelling. It appears that they plan to monitor it. She notes she worries about everything and this sensation could be anxiety related. She has no exertional symptoms. Typically occurs when she is just sitting there. She has previously prone to panic attacks though has not had one of those in over a year. She does note some reflux at times. She takes Zantac intermittently.  She also reports it feels as though her mouth is foamy. She constantly wants to drink water. She drinks 60+ ounces of water daily. She sees ear nose and throat in October.  PMH: Former smoker   ROS see history of present illness  Objective  Physical Exam Vitals:   05/01/17 1321 05/01/17 1345  BP: (!) 154/90 (!) 142/90  Pulse: 79   Temp: 98.2 F (36.8 C)   SpO2: 95%     BP Readings from Last 3 Encounters:  05/01/17 (!) 142/90  03/16/17 128/80  02/07/17 126/80   Wt Readings from Last 3 Encounters:  05/01/17 182 lb (82.6 kg)  03/16/17 186 lb 3.2 oz (84.5 kg)  02/07/17 184 lb 1.9 oz (83.5 kg)     Physical Exam  Constitutional: No distress.  HENT:  Head: Normocephalic and atraumatic.  Mouth/Throat: Oropharynx is clear and moist. No oropharyngeal exudate.  Eyes: Pupils are equal, round, and reactive to light. Conjunctivae are normal.  Cardiovascular: Normal rate, regular rhythm and normal heart sounds.   Pulmonary/Chest: Effort normal and breath sounds normal.  Neurological: She is alert.  CN 2-12 intact, 5/5 strength in bilateral biceps, triceps, grip, quads, hamstrings, plantar and dorsiflexion, sensation to light touch intact in bilateral UE and LE, normal gait  Skin: Skin is warm and dry. She is not diaphoretic.   no calf swelling or tenderness   Assessment/Plan: Please see individual problem list.  Essential hypertension Above goal. She started back on amlodipine several days ago. Discussed it may take a number of days for this to start to work. She'll monitor and let us know what her blood pressures running about a week from now.  Anxiety The sensation she has as a right side of her chest may be anxiety related or reflux related. Is very atypical and does not sound to be cardiac in nature. She has no leg swelling. She has no shortness of breath or chest pain. Discussed treatment for anxiety though she deferred this. Advised to take Zantac 30 minutes prior to meals breakfast and dinner to see if that helps with her reflux symptoms. She is given  return precautions.  Foamy mouth sensation No obvious abnormalities on exam. She'll keep her appointment with ENT next month.   [name], MD [place]

## 2017-05-01 NOTE — Assessment & Plan Note (Signed)
No obvious abnormalities on exam. She'll keep her appointment with ENT next month.

## 2017-05-01 NOTE — Assessment & Plan Note (Signed)
The sensation she has as a right side of her chest may be anxiety related or reflux related. Is very atypical and does not sound to be cardiac in nature. She has no leg swelling. She has no shortness of breath or chest pain. Discussed treatment for anxiety though she deferred this. Advised to take Zantac 30 minutes prior to meals breakfast and dinner to see if that helps with her reflux symptoms. She is given return precautions.

## 2017-05-03 ENCOUNTER — Ambulatory Visit: Payer: Medicare Other | Admitting: Family Medicine

## 2017-05-11 DIAGNOSIS — N2581 Secondary hyperparathyroidism of renal origin: Secondary | ICD-10-CM | POA: Diagnosis not present

## 2017-05-11 DIAGNOSIS — N183 Chronic kidney disease, stage 3 (moderate): Secondary | ICD-10-CM | POA: Diagnosis not present

## 2017-05-11 DIAGNOSIS — I10 Essential (primary) hypertension: Secondary | ICD-10-CM | POA: Diagnosis not present

## 2017-07-12 DIAGNOSIS — J309 Allergic rhinitis, unspecified: Secondary | ICD-10-CM | POA: Diagnosis not present

## 2017-07-12 DIAGNOSIS — H6123 Impacted cerumen, bilateral: Secondary | ICD-10-CM | POA: Diagnosis not present

## 2017-07-12 DIAGNOSIS — H60543 Acute eczematoid otitis externa, bilateral: Secondary | ICD-10-CM | POA: Diagnosis not present

## 2017-07-25 ENCOUNTER — Ambulatory Visit
Admission: RE | Admit: 2017-07-25 | Discharge: 2017-07-25 | Disposition: A | Discharge status: Home / Self Care | Payer: Medicare Other | Source: Ambulatory Visit | Attending: Hematology and Oncology | Admitting: Hematology and Oncology

## 2017-07-25 ENCOUNTER — Other Ambulatory Visit: Payer: Self-pay | Admitting: Hematology and Oncology

## 2017-07-25 DIAGNOSIS — Z1231 Encounter for screening mammogram for malignant neoplasm of breast: Secondary | ICD-10-CM | POA: Insufficient documentation

## 2017-07-25 DIAGNOSIS — D0511 Intraductal carcinoma in situ of right breast: Secondary | ICD-10-CM

## 2017-07-25 DIAGNOSIS — Z853 Personal history of malignant neoplasm of breast: Secondary | ICD-10-CM | POA: Insufficient documentation

## 2017-07-26 ENCOUNTER — Other Ambulatory Visit: Payer: Self-pay | Admitting: *Deleted

## 2017-07-26 DIAGNOSIS — D0511 Intraductal carcinoma in situ of right breast: Secondary | ICD-10-CM

## 2017-07-27 ENCOUNTER — Inpatient Hospital Stay (HOSPITAL_BASED_OUTPATIENT_CLINIC_OR_DEPARTMENT_OTHER): Payer: Medicare Other | Admitting: Hematology and Oncology

## 2017-07-27 ENCOUNTER — Inpatient Hospital Stay: Payer: Medicare Other | Attending: Hematology and Oncology

## 2017-07-27 ENCOUNTER — Other Ambulatory Visit: Payer: Self-pay

## 2017-07-27 VITALS — BP 134/68 | HR 57 | Temp 95.8°F | Resp 18 | Wt 178.9 lb

## 2017-07-27 DIAGNOSIS — E559 Vitamin D deficiency, unspecified: Secondary | ICD-10-CM

## 2017-07-27 DIAGNOSIS — K219 Gastro-esophageal reflux disease without esophagitis: Secondary | ICD-10-CM

## 2017-07-27 DIAGNOSIS — E785 Hyperlipidemia, unspecified: Secondary | ICD-10-CM | POA: Diagnosis not present

## 2017-07-27 DIAGNOSIS — I129 Hypertensive chronic kidney disease with stage 1 through stage 4 chronic kidney disease, or unspecified chronic kidney disease: Secondary | ICD-10-CM | POA: Diagnosis not present

## 2017-07-27 DIAGNOSIS — Z9223 Personal history of estrogen therapy: Secondary | ICD-10-CM | POA: Diagnosis not present

## 2017-07-27 DIAGNOSIS — Z79899 Other long term (current) drug therapy: Secondary | ICD-10-CM | POA: Diagnosis not present

## 2017-07-27 DIAGNOSIS — Z923 Personal history of irradiation: Secondary | ICD-10-CM | POA: Insufficient documentation

## 2017-07-27 DIAGNOSIS — E039 Hypothyroidism, unspecified: Secondary | ICD-10-CM

## 2017-07-27 DIAGNOSIS — M8580 Other specified disorders of bone density and structure, unspecified site: Secondary | ICD-10-CM | POA: Diagnosis not present

## 2017-07-27 DIAGNOSIS — M1990 Unspecified osteoarthritis, unspecified site: Secondary | ICD-10-CM | POA: Diagnosis not present

## 2017-07-27 DIAGNOSIS — Z86000 Personal history of in-situ neoplasm of breast: Secondary | ICD-10-CM

## 2017-07-27 DIAGNOSIS — Z170 Estrogen receptor positive status [ER+]: Secondary | ICD-10-CM

## 2017-07-27 DIAGNOSIS — N189 Chronic kidney disease, unspecified: Secondary | ICD-10-CM

## 2017-07-27 DIAGNOSIS — Z87891 Personal history of nicotine dependence: Secondary | ICD-10-CM | POA: Diagnosis not present

## 2017-07-27 DIAGNOSIS — D0511 Intraductal carcinoma in situ of right breast: Secondary | ICD-10-CM

## 2017-07-27 DIAGNOSIS — Z86010 Personal history of colonic polyps: Secondary | ICD-10-CM | POA: Diagnosis not present

## 2017-07-27 DIAGNOSIS — M810 Age-related osteoporosis without current pathological fracture: Secondary | ICD-10-CM

## 2017-07-27 LAB — COMPREHENSIVE METABOLIC PANEL
ALT: 15 U/L (ref 14–54)
AST: 17 U/L (ref 15–41)
Albumin: 4 g/dL (ref 3.5–5.0)
Alkaline Phosphatase: 57 U/L (ref 38–126)
Anion gap: 7 (ref 5–15)
BUN: 15 mg/dL (ref 6–20)
CO2: 28 mmol/L (ref 22–32)
Calcium: 9.4 mg/dL (ref 8.9–10.3)
Chloride: 104 mmol/L (ref 101–111)
Creatinine, Ser: 1.06 mg/dL — ABNORMAL HIGH (ref 0.44–1.00)
GFR calc Af Amer: 59 mL/min — ABNORMAL LOW (ref >60)
GFR calc non Af Amer: 51 mL/min — ABNORMAL LOW (ref >60)
Glucose, Bld: 95 mg/dL (ref 65–99)
Potassium: 4.2 mmol/L (ref 3.5–5.1)
Sodium: 139 mmol/L (ref 135–145)
Total Bilirubin: 0.5 mg/dL (ref 0.3–1.2)
Total Protein: 7.2 g/dL (ref 6.5–8.1)

## 2017-07-27 LAB — CBC WITH DIFFERENTIAL/PLATELET
Basophils Absolute: 0 10*3/uL (ref 0–0.1)
Basophils Relative: 1 %
Eosinophils Absolute: 0.1 10*3/uL (ref 0–0.7)
Eosinophils Relative: 2 %
HCT: 37.9 % (ref 35.0–47.0)
Hemoglobin: 12.3 g/dL (ref 12.0–16.0)
Lymphocytes Relative: 44 %
Lymphs Abs: 1.4 10*3/uL (ref 1.0–3.6)
MCH: 27.4 pg (ref 26.0–34.0)
MCHC: 32.5 g/dL (ref 32.0–36.0)
MCV: 84.5 fL (ref 80.0–100.0)
Monocytes Absolute: 0.4 10*3/uL (ref 0.2–0.9)
Monocytes Relative: 13 %
Neutro Abs: 1.3 10*3/uL — ABNORMAL LOW (ref 1.4–6.5)
Neutrophils Relative %: 40 %
Platelets: 182 10*3/uL (ref 150–440)
RBC: 4.49 MIL/uL (ref 3.80–5.20)
RDW: 14.9 % — ABNORMAL HIGH (ref 11.5–14.5)
WBC: 3.2 10*3/uL — ABNORMAL LOW (ref 3.6–11.0)

## 2017-07-27 NOTE — Progress Notes (Signed)
Here for follow up. Doing well she stated  

## 2017-07-27 NOTE — Progress Notes (Signed)
[facility] day:  07/27/2017   Chief Complaint: Lindsey Foster is a 71 y.o. 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  At that time, she felt good.  Exam was unremarkable.  CA27.29 was 37.8 (normal).    Bilateral screening mammogram on 07/25/2017 revealed no evidence of malignancy.  During the interim, patient has been doing "pretty good". Patient states, "I am still here". Patient has no physical complaints today. Patient denies breast concerns. She has not experienced any B symptoms or recent infections.   She states that the mastectomy bras work well.   Past Medical History:  Diagnosis Date  . Allergic rhinitis   . Anxiety    panic attacks in hot rooms  . Arthritis   . Blood in stool   . Breast cancer ([place]) 2006   radiation - Rt  . Chickenpox   . CKD (chronic kidney disease)   . Colon polyps   . Diverticulosis   . GERD (gastroesophageal reflux disease)   . Heart murmur   . Hyperlipidemia   . Hypertension   . Hypothyroidism   . Multiple thyroid nodules   . Osteopenia   . Personal history of radiation therapy   . Urinary incontinence   . UTI (lower urinary tract infection)   . Vitamin D deficiency disease     Past Surgical History:  Procedure Laterality Date  . BLADDER SURGERY    . BREAST EXCISIONAL BIOPSY Right 2006   positive  . 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 CESAREAN SECTION  1985  . COLONOSCOPY WITH PROPOFOL N/A 09/28/2015   Procedure: COLONOSCOPY WITH PROPOFOL;  Surgeon: Lollie Sails, MD;  Location: [facility] ENDOSCOPY;  Service: Endoscopy;  Laterality: N/A;  . DILATION AND CURETTAGE OF UTERUS  2012  . Thyroid nodule ablation  2003  . Uterine polyp removal  2009     Family History  Problem Relation Age of Onset  . Heart disease Unknown        Parent  . Kidney disease Unknown        Parent  .  Uterine cancer Maternal Aunt   . Kidney disease Mother   . Angina Father   . Heart attack Father   . Multiple myeloma Brother   . Breast cancer Neg Hx     Social History:  reports that she has quit smoking. she has never used smokeless tobacco. She reports that she does not drink alcohol or use drugs.  She is originally from [place].  She will be visiting the [facility].  The patient is alone today.  Allergies:  Allergies  Allergen Reactions  . Latex Rash    Current Medications: Current Outpatient Medications  Medication Sig Dispense Refill  . amLODipine (NORVASC) 5 MG tablet Take 1 tablet (5 mg total) by mouth daily. 90 tablet 3  . Azelastine HCl 0.15 % SOLN     . Calcium Citrate-Vitamin D3 1000-400 LIQD Take by mouth. Reported on 02/04/2016    . clobetasol cream (TEMOVATE) 0.05 % Apply topically 2 (two) times daily. For 2 weeks, then just on week days 60 g 1  . ketotifen (ZADITOR) 0.025 % ophthalmic solution 1 drop 2 (two) times daily.    . mometasone (ELOCON) 0.1 % lotion Apply topically daily.    . fluticasone (FLONASE) 50 MCG/ACT nasal  spray Place into the nose.    . loratadine (CLARITIN) 10 MG tablet Take 1 tablet (10 mg total) by mouth daily. (Patient not taking: Reported on 07/27/2017) 30 tablet 11  . nystatin cream (MYCOSTATIN) Apply 1 application topically 2 (two) times daily. (Patient not taking: Reported on 07/27/2017) 30 g 0  . Olopatadine HCl 0.2 % SOLN Apply to eye.    . ranitidine (ZANTAC) 150 MG tablet Take 1 tablet (150 mg total) by mouth 2 (two) times daily. (Patient not taking: Reported on 07/27/2017) 60 tablet 1   No current facility-administered medications for this visit.     Review of Systems:  GENERAL:  Feels "pretty good".  Active.  No fevers or sweats.  Weight down 4 pounds. PERFORMANCE STATUS (ECOG):  1 HEENT:  No visual changes, runny nose, sore throat, mouth sores or tenderness. Lungs: No shortness of breath or cough.  No hemoptysis. Cardiac:   No chest pain, palpitations, orthopnea, or PND. GI:  Trouble swallowing pills (always).  No nausea, vomiting, diarrhea, constipation, melena or hematochezia. GU:  No urgency, frequency, dysuria, or hematuria. Musculoskeletal:  No back pain.  Arthritis.  No muscle tenderness. Extremities:  No pain or swelling. Skin:  No rashes or skin changes. Neuro:  No headache, numbness or weakness, balance or coordination issues. Endocrine:  No diabetes, thyroid issues, hot flashes or night sweats. Psych:  No mood changes, depression or anxiety. Pain:  No focal pain. Review of systems:  All other systems reviewed and found to be negative.  Physical Exam: Blood pressure 134/68, pulse (!) 57, temperature (!) 95.8 F (35.4 C), temperature source Tympanic, resp. rate 18, weight 178 lb 14.4 oz (81.1 kg). GENERAL:  Well developed, well nourished, woman sitting comfortably in the exam room in no acute distress. MENTAL STATUS:  Alert and oriented to person, place and time. HEAD:  Brown short hair.  Normocephalic, atraumatic, face symmetric, no Cushingoid features. EYES:  Glasses.  Brown eyes.  Pupils equal round and reactive to light and accomodation.  No conjunctivitis or scleral icterus. ENT:  Oropharynx clear without lesion.  Tongue normal. Mucous membranes moist.  RESPIRATORY:  Clear to auscultation without rales, wheezes or rhonchi. CARDIOVASCULAR:  Regular rate and rhythm without murmur, rub or gallop. BREASTS:  Right breast with inferior scarring.  No discrete masses, skin changes or nipple discharge.  Left breast without masses, skin changes or nipple discharge.  Moderate fibrocystic changes superiorly. ABDOMEN:  Soft, non-tender, with active bowel sounds, and no hepatosplenomegaly.  No masses. SKIN:  No rashes, ulcers or lesions. EXTREMITIES: No edema, no skin discoloration or tenderness.  No palpable cords. LYMPH NODES: No palpable cervical, supraclavicular, axillary or inguinal adenopathy   NEUROLOGICAL: Unremarkable. PSYCH:  Appropriate.   Appointment on 07/27/2017  Component Date Value Ref Range Status  . Sodium 07/27/2017 139  135 - 145 mmol/L Final  . Potassium 07/27/2017 4.2  3.5 - 5.1 mmol/L Final  . Chloride 07/27/2017 104  101 - 111 mmol/L Final  . CO2 07/27/2017 28  22 - 32 mmol/L Final  . Glucose, Bld 07/27/2017 95  65 - 99 mg/dL Final  . BUN 07/27/2017 15  6 - 20 mg/dL Final  . Creatinine, Ser 07/27/2017 1.06* 0.44 - 1.00 mg/dL Final  . Calcium 07/27/2017 9.4  8.9 - 10.3 mg/dL Final  . Total Protein 07/27/2017 7.2  6.5 - 8.1 g/dL Final  . Albumin 07/27/2017 4.0  3.5 - 5.0 g/dL Final  . AST 07/27/2017 17  15 - 41  U/L Final  . ALT 07/27/2017 15  14 - 54 U/L Final  . Alkaline Phosphatase 07/27/2017 57  38 - 126 U/L Final  . Total Bilirubin 07/27/2017 0.5  0.3 - 1.2 mg/dL Final  . GFR calc non Af Amer 07/27/2017 51* >60 mL/min Final  . GFR calc Af Amer 07/27/2017 59* >60 mL/min Final   Comment: (NOTE) The eGFR has been calculated using the CKD EPI equation. This calculation has not been validated in all clinical situations. eGFR's persistently <60 mL/min signify possible Chronic Kidney Disease.   . Anion gap 07/27/2017 7  5 - 15 Final  . WBC 07/27/2017 3.2* 3.6 - 11.0 K/uL Final  . RBC 07/27/2017 4.49  3.80 - 5.20 MIL/uL Final  . Hemoglobin 07/27/2017 12.3  12.0 - 16.0 g/dL Final  . HCT 07/27/2017 37.9  35.0 - 47.0 % Final  . MCV 07/27/2017 84.5  80.0 - 100.0 fL Final  . MCH 07/27/2017 27.4  26.0 - 34.0 pg Final  . MCHC 07/27/2017 32.5  32.0 - 36.0 g/dL Final  . RDW 07/27/2017 14.9* 11.5 - 14.5 % Final  . Platelets 07/27/2017 182  150 - 440 K/uL Final  . Neutrophils Relative % 07/27/2017 40  % Final  . Neutro Abs 07/27/2017 1.3* 1.4 - 6.5 K/uL Final  . Lymphocytes Relative 07/27/2017 44  % Final  . Lymphs Abs 07/27/2017 1.4  1.0 - 3.6 K/uL Final  . Monocytes Relative 07/27/2017 13  % Final  . Monocytes Absolute 07/27/2017 0.4  0.2 - 0.9 K/uL Final  .  Eosinophils Relative 07/27/2017 2  % Final  . Eosinophils Absolute 07/27/2017 0.1  0 - 0.7 K/uL Final  . Basophils Relative 07/27/2017 1  % Final  . Basophils Absolute 07/27/2017 0.0  0 - 0.1 K/uL Final    Assessment:  Lindsey Foster is a 71 y.o. 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  She received radiation to the right breast from 08/2005 - 09/2005 at the [facility] in [place].  She then received tamoxifen for 5 years (12/2005-11/2010).  Bilateral screening mammogram on 07/25/2017 revealed no evidence of malignancy.    CA27.29 has been followed:  37.9 on 07/04/2014, 43.9 on 07/24/2015, 54.3 on 08/21/2015, 46.2 on 09/22/2015, 38.7 on 03/29/2016, 37.8 on 07/25/2016 and 35.9 on 07/27/2017.  She has progressive osteoporosis.  Bone density study on 12/11/2012 revealed a T score of -2.3 in the left femur.  Bone density study on 07/17/2014 revealed a T-score of -3.0 in the left femoral neck and -1.8 in the L1-L4 spine.  Bone density study on 07/20/2015 revealed osteoporosis with a T score of -3.1 in the left hip.    She previously developed GI symptoms on Fosamax.  She declined Zometa and Prolia.   Symptomatically, she is doing well.  Exam is unremarkable. Labs are stable.   Plan: 1. Labs today:  CBC with diff, CMP, CA27.29. 2. Copy of labs for patient. 3. Schedule bone density study. 4. Discuss osteoporosis.  Encouraged calcium 1200 mg and vitamin D 800 IU a day.  Discuss Prolia.  Encourage ongoing discussions with PCP. 5. Schedule mammogram 07/25/2018. 6. RTC in 1 year for MD assessment, labs (CBC with diff, CMP, CA27.29), and review of mammogram.   Honor Loh, NP  07/27/2017, 10:35 AM    I saw and evaluated the patient, participating in the key portions of the service and reviewing pertinent diagnostic studies and records.  I reviewed  the nurse practitioner's note and agree with the findings and the plan.  The assessment  and plan were discussed with the patient.  A few questions were asked by the patient and answered.   Nolon Stalls, MD 07/27/2017,10:35 AM

## 2017-07-28 LAB — CANCER ANTIGEN 27.29: CA 27.29: 35.9 U/mL (ref 0.0–38.6)

## 2017-07-30 ENCOUNTER — Encounter: Payer: Self-pay | Admitting: Hematology and Oncology

## 2017-08-01 ENCOUNTER — Ambulatory Visit (INDEPENDENT_AMBULATORY_CARE_PROVIDER_SITE_OTHER): Payer: Medicare Other | Admitting: Family Medicine

## 2017-08-01 ENCOUNTER — Encounter: Payer: Self-pay | Admitting: Family Medicine

## 2017-08-01 VITALS — BP 130/70 | HR 60 | Temp 98.3°F | Wt 179.2 lb

## 2017-08-01 DIAGNOSIS — I10 Essential (primary) hypertension: Secondary | ICD-10-CM | POA: Diagnosis not present

## 2017-08-01 DIAGNOSIS — G8929 Other chronic pain: Secondary | ICD-10-CM | POA: Diagnosis not present

## 2017-08-01 DIAGNOSIS — K219 Gastro-esophageal reflux disease without esophagitis: Secondary | ICD-10-CM

## 2017-08-01 DIAGNOSIS — E785 Hyperlipidemia, unspecified: Secondary | ICD-10-CM | POA: Diagnosis not present

## 2017-08-01 DIAGNOSIS — M25562 Pain in left knee: Secondary | ICD-10-CM | POA: Diagnosis not present

## 2017-08-01 DIAGNOSIS — M940 Chondrocostal junction syndrome [Tietze]: Secondary | ICD-10-CM

## 2017-08-01 DIAGNOSIS — M25662 Stiffness of left knee, not elsewhere classified: Secondary | ICD-10-CM

## 2017-08-01 DIAGNOSIS — Z8639 Personal history of other endocrine, nutritional and metabolic disease: Secondary | ICD-10-CM

## 2017-08-01 DIAGNOSIS — M810 Age-related osteoporosis without current pathological fracture: Secondary | ICD-10-CM | POA: Diagnosis not present

## 2017-08-01 MED ORDER — AMLODIPINE BESYLATE 5 MG PO TABS: 5.0000 mg | ORAL_TABLET | Freq: Every day | ORAL | 3 refills | Status: DC

## 2017-08-01 NOTE — Assessment & Plan Note (Signed)
Chronic left knee pain.  Related to osteoarthritis.  Discussed that she would likely need it replaced based on orthopedics recommendations.  We will trial physical therapy to see if this is beneficial.

## 2017-08-01 NOTE — Assessment & Plan Note (Signed)
Symptoms and exam most consistent with costochondritis or strain of chest.  No cardiac symptoms.  Vital signs stable.  This has improved quite a bit at this point.  She will monitor and if worsens or develops new symptoms to be reevaluated.

## 2017-08-01 NOTE — Patient Instructions (Signed)
Nice to see you. We will get you set up with physical therapy for your left knee. We will have you come back for lab work on 08/11/17 or later. If you develop chest pressure, shortness of breath, sweatiness, or any new or changing symptoms please be evaluated.

## 2017-08-01 NOTE — Assessment & Plan Note (Signed)
Well-controlled.  Continue as needed Zantac.

## 2017-08-01 NOTE — Assessment & Plan Note (Signed)
DEXA scan ordered by oncology.  Consider treatment pending results.

## 2017-08-01 NOTE — Assessment & Plan Note (Signed)
Well controlled. Continue amlodipine 

## 2017-08-01 NOTE — Assessment & Plan Note (Signed)
History of hyperactive thyroid nodules ablated in 2003.  Needs follow-up TSH.

## 2017-08-01 NOTE — Progress Notes (Signed)
[name], MD Phone: [phone]  Lindsey Foster is a 71 y.o. female who presents today for follow-up.  Hypertension: Has been more well controlled than today at home.  Taking amlodipine.  No shortness of breath or edema.  No lightheadedness.  Patient notes after shoveling snow she had a sharp discomfort at a pinpoint region in her right costochondral joints.  Bothered her when she twisted or moved a certain way.  No pleuritic component.  No shortness of breath.  No exertional component.  No diaphoresis.  No radiation.  This has improved.  She has chronic left knee pain that has been worse since being out in the snow.  Notes her knee would twist when she stepped into the snow.  Her whole knee hurts.  Difficulty bending it at times.  She saw orthopedics and they recommended a knee replacement.  She has not done physical therapy.  Osteoporosis: She is scheduled for DEXA scan in January.  Previously tried Fosamax though this gave her a dry mouth.  History of thyroid nodules: Had these ablated previously.  Has been doing yearly TSH checks.  GERD: Taking Zantac as needed.  Rarely gets symptoms only occur if she eats tomatoes.  No abdominal pain or blood in her stool.  Social History   Tobacco Use  Smoking Status Former Smoker  Smokeless Tobacco Never Used     ROS see history of present illness  Objective  Physical Exam Vitals:   08/01/17 1003  BP: 130/70  Pulse: 60  Temp: 98.3 F (36.8 C)  SpO2: 97%    BP Readings from Last 3 Encounters:  08/01/17 130/70  07/27/17 134/68  05/01/17 (!) 142/90   Wt Readings from Last 3 Encounters:  08/01/17 179 lb 3.2 oz (81.3 kg)  07/27/17 178 lb 14.4 oz (81.1 kg)  05/01/17 182 lb (82.6 kg)    Physical Exam  Constitutional: No distress.  Cardiovascular: Normal rate, regular rhythm and normal heart sounds.  Pulmonary/Chest: Effort normal and breath sounds normal.    Abdominal: Soft. Bowel sounds are normal. She  exhibits no distension. There is no tenderness. There is no rebound and no guarding.  Musculoskeletal: She exhibits no edema.  Left knee with no tenderness or swelling, no ligamentous laxity, negative McMurray's, right knee with no tenderness or swelling, no ligamentous laxity, negative McMurray's,  Neurological: She is alert. Gait normal.  Skin: Skin is warm and dry. She is not diaphoretic.     Assessment/Plan: Please see individual problem list.  Essential hypertension Well-controlled.  Continue amlodipine.  GERD (gastroesophageal reflux disease) Well-controlled.  Continue as needed Zantac.  Osteoporosis DEXA scan ordered by oncology.  Consider treatment pending results.  History of thyroid nodule History of hyperactive thyroid nodules ablated in 2003.  Needs follow-up TSH.  Stiffness of left knee Chronic left knee pain.  Related to osteoarthritis.  Discussed that she would likely need it replaced based on orthopedics recommendations.  We will trial physical therapy to see if this is beneficial.  Costochondritis Symptoms and exam most consistent with costochondritis or strain of chest.  No cardiac symptoms.  Vital signs stable.  This has improved quite a bit at this point.  She will monitor and if worsens or develops new symptoms to be reevaluated.   Chriselda was seen today for follow-up.  Diagnoses and all orders for this visit:  Hyperlipidemia, unspecified hyperlipidemia type -     Lipid panel; Future  Chronic pain of left knee -     Ambulatory referral  to Physical Therapy  History of thyroid nodule -     TSH; Future  Essential hypertension  Gastroesophageal reflux disease, esophagitis presence not specified  Osteoporosis without current pathological fracture, unspecified osteoporosis type  Stiffness of left knee  Costochondritis  Other orders -     amLODipine (NORVASC) 5 MG tablet; Take 1 tablet (5 mg total) by mouth daily.    Orders Placed This Encounter    Procedures  . Lipid panel    Standing Status:   Future    Standing Expiration Date:   08/01/2018  . TSH    Standing Status:   Future    Standing Expiration Date:   08/01/2018  . Ambulatory referral to Physical Therapy    Referral Priority:   Routine    Referral Type:   Physical Medicine    Referral Reason:   Specialty Services Required    Requested Specialty:   Physical Therapy    Number of Visits Requested:   1    Meds ordered this encounter  Medications  . amLODipine (NORVASC) 5 MG tablet    Sig: Take 1 tablet (5 mg total) by mouth daily.    Dispense:  90 tablet    Refill:  [place], MD [place]

## 2017-08-11 DIAGNOSIS — R0602 Shortness of breath: Secondary | ICD-10-CM | POA: Diagnosis not present

## 2017-08-11 DIAGNOSIS — I10 Essential (primary) hypertension: Secondary | ICD-10-CM | POA: Diagnosis not present

## 2017-08-11 DIAGNOSIS — R0789 Other chest pain: Secondary | ICD-10-CM | POA: Diagnosis not present

## 2017-08-11 DIAGNOSIS — I208 Other forms of angina pectoris: Secondary | ICD-10-CM | POA: Diagnosis not present

## 2017-08-11 DIAGNOSIS — R002 Palpitations: Secondary | ICD-10-CM | POA: Diagnosis not present

## 2017-08-22 ENCOUNTER — Other Ambulatory Visit (INDEPENDENT_AMBULATORY_CARE_PROVIDER_SITE_OTHER): Payer: Medicare Other

## 2017-08-22 DIAGNOSIS — Z8639 Personal history of other endocrine, nutritional and metabolic disease: Secondary | ICD-10-CM | POA: Diagnosis not present

## 2017-08-22 DIAGNOSIS — E785 Hyperlipidemia, unspecified: Secondary | ICD-10-CM | POA: Diagnosis not present

## 2017-08-22 LAB — TSH: TSH: 4.02 u[IU]/mL (ref 0.35–4.50)

## 2017-08-22 LAB — LIPID PANEL
CHOL/HDL RATIO: 3
Cholesterol: 220 mg/dL — ABNORMAL HIGH (ref 0–200)
HDL: 74.6 mg/dL (ref >39.00)
LDL Cholesterol: 135 mg/dL — ABNORMAL HIGH (ref 0–99)
NonHDL: 145.05
Triglycerides: 52 mg/dL (ref 0.0–149.0)
VLDL: 10.4 mg/dL (ref 0.0–40.0)

## 2017-08-24 DIAGNOSIS — I208 Other forms of angina pectoris: Secondary | ICD-10-CM | POA: Diagnosis not present

## 2017-08-24 DIAGNOSIS — R0602 Shortness of breath: Secondary | ICD-10-CM | POA: Diagnosis not present

## 2017-08-24 NOTE — Progress Notes (Signed)
The 10-year ASCVD risk score Mikey Bussing DC Brooke Bonito., et al., 2013) is: 17.1%   Values used to calculate the score:     Age: 72 years     Sex: Female     Is Non-Hispanic African American: Yes     Diabetic: No     Tobacco smoker: No     Systolic Blood Pressure: 681 mmHg     Is BP treated: Yes     HDL Cholesterol: 74.6 mg/dL     Total Cholesterol: 220 mg/dL

## 2017-08-25 ENCOUNTER — Telehealth: Payer: Self-pay

## 2017-08-25 NOTE — Telephone Encounter (Signed)
Mailed, patient notified

## 2017-08-25 NOTE — Telephone Encounter (Signed)
Copied from [place] [id number]. Topic: Inquiry >> Aug 25, 2017 10:24 AM Pricilla Handler wrote: Reason for CRM: Patient called requesting a copy of her last lab sent to her by mail. Please someone call her from the office once copies are made.     Thank You!!!

## 2017-08-30 DIAGNOSIS — R2689 Other abnormalities of gait and mobility: Secondary | ICD-10-CM | POA: Diagnosis not present

## 2017-08-30 DIAGNOSIS — M25562 Pain in left knee: Secondary | ICD-10-CM | POA: Diagnosis not present

## 2017-09-01 DIAGNOSIS — N289 Disorder of kidney and ureter, unspecified: Secondary | ICD-10-CM | POA: Diagnosis not present

## 2017-09-01 DIAGNOSIS — R002 Palpitations: Secondary | ICD-10-CM | POA: Diagnosis not present

## 2017-09-01 DIAGNOSIS — R0602 Shortness of breath: Secondary | ICD-10-CM | POA: Diagnosis not present

## 2017-09-01 DIAGNOSIS — I208 Other forms of angina pectoris: Secondary | ICD-10-CM | POA: Diagnosis not present

## 2017-09-01 DIAGNOSIS — I10 Essential (primary) hypertension: Secondary | ICD-10-CM | POA: Diagnosis not present

## 2017-09-05 ENCOUNTER — Ambulatory Visit
Admission: RE | Admit: 2017-09-05 | Discharge: 2017-09-05 | Disposition: A | Discharge status: Home / Self Care | Payer: Medicare Other | Source: Ambulatory Visit | Attending: Urgent Care | Admitting: Urgent Care

## 2017-09-05 DIAGNOSIS — D0511 Intraductal carcinoma in situ of right breast: Secondary | ICD-10-CM | POA: Diagnosis not present

## 2017-09-05 DIAGNOSIS — M810 Age-related osteoporosis without current pathological fracture: Secondary | ICD-10-CM | POA: Diagnosis not present

## 2017-09-05 DIAGNOSIS — M25562 Pain in left knee: Secondary | ICD-10-CM | POA: Diagnosis not present

## 2017-09-05 DIAGNOSIS — Z780 Asymptomatic menopausal state: Secondary | ICD-10-CM | POA: Diagnosis not present

## 2017-09-05 DIAGNOSIS — R2689 Other abnormalities of gait and mobility: Secondary | ICD-10-CM | POA: Diagnosis not present

## 2017-09-06 ENCOUNTER — Telehealth: Payer: Self-pay | Admitting: *Deleted

## 2017-09-06 NOTE — Telephone Encounter (Signed)
Erroneous error

## 2017-09-07 DIAGNOSIS — M25562 Pain in left knee: Secondary | ICD-10-CM | POA: Diagnosis not present

## 2017-09-07 DIAGNOSIS — R2689 Other abnormalities of gait and mobility: Secondary | ICD-10-CM | POA: Diagnosis not present

## 2017-09-11 DIAGNOSIS — R2689 Other abnormalities of gait and mobility: Secondary | ICD-10-CM | POA: Diagnosis not present

## 2017-09-11 DIAGNOSIS — M25562 Pain in left knee: Secondary | ICD-10-CM | POA: Diagnosis not present

## 2017-09-12 DIAGNOSIS — I10 Essential (primary) hypertension: Secondary | ICD-10-CM | POA: Diagnosis not present

## 2017-09-12 DIAGNOSIS — N183 Chronic kidney disease, stage 3 (moderate): Secondary | ICD-10-CM | POA: Diagnosis not present

## 2017-09-12 DIAGNOSIS — N2581 Secondary hyperparathyroidism of renal origin: Secondary | ICD-10-CM | POA: Diagnosis not present

## 2017-09-13 DIAGNOSIS — M25562 Pain in left knee: Secondary | ICD-10-CM | POA: Diagnosis not present

## 2017-09-13 DIAGNOSIS — R2689 Other abnormalities of gait and mobility: Secondary | ICD-10-CM | POA: Diagnosis not present

## 2017-09-19 ENCOUNTER — Ambulatory Visit: Payer: Medicare Other | Admitting: Family Medicine

## 2017-09-20 DIAGNOSIS — M25562 Pain in left knee: Secondary | ICD-10-CM | POA: Diagnosis not present

## 2017-09-20 DIAGNOSIS — R2689 Other abnormalities of gait and mobility: Secondary | ICD-10-CM | POA: Diagnosis not present

## 2017-09-22 DIAGNOSIS — R2689 Other abnormalities of gait and mobility: Secondary | ICD-10-CM | POA: Diagnosis not present

## 2017-09-22 DIAGNOSIS — M25562 Pain in left knee: Secondary | ICD-10-CM | POA: Diagnosis not present

## 2017-09-25 DIAGNOSIS — R2689 Other abnormalities of gait and mobility: Secondary | ICD-10-CM | POA: Diagnosis not present

## 2017-09-25 DIAGNOSIS — M25562 Pain in left knee: Secondary | ICD-10-CM | POA: Diagnosis not present

## 2017-10-03 DIAGNOSIS — R2689 Other abnormalities of gait and mobility: Secondary | ICD-10-CM | POA: Diagnosis not present

## 2017-10-03 DIAGNOSIS — M25562 Pain in left knee: Secondary | ICD-10-CM | POA: Diagnosis not present

## 2017-10-10 DIAGNOSIS — R2689 Other abnormalities of gait and mobility: Secondary | ICD-10-CM | POA: Diagnosis not present

## 2017-10-10 DIAGNOSIS — M25562 Pain in left knee: Secondary | ICD-10-CM | POA: Diagnosis not present

## 2017-10-17 DIAGNOSIS — M25562 Pain in left knee: Secondary | ICD-10-CM | POA: Diagnosis not present

## 2017-10-17 DIAGNOSIS — R2689 Other abnormalities of gait and mobility: Secondary | ICD-10-CM | POA: Diagnosis not present

## 2017-10-24 DIAGNOSIS — R2689 Other abnormalities of gait and mobility: Secondary | ICD-10-CM | POA: Diagnosis not present

## 2017-10-24 DIAGNOSIS — M25562 Pain in left knee: Secondary | ICD-10-CM | POA: Diagnosis not present

## 2017-11-09 ENCOUNTER — Other Ambulatory Visit: Payer: Self-pay | Admitting: Family Medicine

## 2017-12-18 DIAGNOSIS — H2513 Age-related nuclear cataract, bilateral: Secondary | ICD-10-CM | POA: Diagnosis not present

## 2018-01-19 ENCOUNTER — Encounter: Payer: Self-pay | Admitting: Family Medicine

## 2018-01-19 ENCOUNTER — Ambulatory Visit (INDEPENDENT_AMBULATORY_CARE_PROVIDER_SITE_OTHER): Payer: Medicare Other | Admitting: Family Medicine

## 2018-01-19 VITALS — BP 120/64 | HR 58 | Temp 98.0°F | Ht 68.0 in | Wt 183.4 lb

## 2018-01-19 DIAGNOSIS — L309 Dermatitis, unspecified: Secondary | ICD-10-CM

## 2018-01-19 DIAGNOSIS — M810 Age-related osteoporosis without current pathological fracture: Secondary | ICD-10-CM | POA: Diagnosis not present

## 2018-01-19 DIAGNOSIS — E785 Hyperlipidemia, unspecified: Secondary | ICD-10-CM

## 2018-01-19 DIAGNOSIS — M940 Chondrocostal junction syndrome [Tietze]: Secondary | ICD-10-CM | POA: Diagnosis not present

## 2018-01-19 DIAGNOSIS — S46811A Strain of other muscles, fascia and tendons at shoulder and upper arm level, right arm, initial encounter: Secondary | ICD-10-CM | POA: Diagnosis not present

## 2018-01-19 DIAGNOSIS — I10 Essential (primary) hypertension: Secondary | ICD-10-CM | POA: Diagnosis not present

## 2018-01-19 DIAGNOSIS — Z8639 Personal history of other endocrine, nutritional and metabolic disease: Secondary | ICD-10-CM | POA: Diagnosis not present

## 2018-01-19 LAB — T4, FREE: FREE T4: 0.86 ng/dL (ref 0.60–1.60)

## 2018-01-19 LAB — COMPREHENSIVE METABOLIC PANEL
ALT: 16 U/L (ref 0–35)
AST: 18 U/L (ref 0–37)
Albumin: 4.2 g/dL (ref 3.5–5.2)
Alkaline Phosphatase: 54 U/L (ref 39–117)
BUN: 16 mg/dL (ref 6–23)
CALCIUM: 9.9 mg/dL (ref 8.4–10.5)
CHLORIDE: 104 meq/L (ref 96–112)
CO2: 30 meq/L (ref 19–32)
CREATININE: 1.19 mg/dL (ref 0.40–1.20)
GFR: 57 mL/min — ABNORMAL LOW (ref >60.00)
Glucose, Bld: 100 mg/dL — ABNORMAL HIGH (ref 70–99)
Potassium: 4.4 mEq/L (ref 3.5–5.1)
Sodium: 141 mEq/L (ref 135–145)
Total Bilirubin: 0.4 mg/dL (ref 0.2–1.2)
Total Protein: 7.4 g/dL (ref 6.0–8.3)

## 2018-01-19 LAB — TSH: TSH: 3.1 u[IU]/mL (ref 0.35–4.50)

## 2018-01-19 LAB — LDL CHOLESTEROL, DIRECT: Direct LDL: 122 mg/dL

## 2018-01-19 LAB — T3, FREE: T3 FREE: 2.9 pg/mL (ref 2.3–4.2)

## 2018-01-19 LAB — VITAMIN D 25 HYDROXY (VIT D DEFICIENCY, FRACTURES): VITD: 37.83 ng/mL (ref 30.00–100.00)

## 2018-01-19 MED ORDER — AMLODIPINE BESYLATE 5 MG PO TABS: 5.0000 mg | ORAL_TABLET | Freq: Every day | ORAL | 1 refills | Status: DC

## 2018-01-19 NOTE — Assessment & Plan Note (Signed)
She has seen dermatology.  She will continue clobetasol cream as needed.

## 2018-01-19 NOTE — Assessment & Plan Note (Signed)
I suspect she is strained her right deltoid muscle.  It is improving.  Discussed ice.  If not continuing to improve she will let us know.

## 2018-01-19 NOTE — Assessment & Plan Note (Signed)
Symptoms and exam most consistent with costochondritis or strain of chest muscles.  No cardiac symptoms.  Discussed monitoring and if worsening letting us know.  Given return precautions.

## 2018-01-19 NOTE — Assessment & Plan Note (Signed)
At goal.  Refill amlodipine.  Lab work as outlined below.

## 2018-01-19 NOTE — Assessment & Plan Note (Signed)
Follow-up TSH today.  We will check free T3 and free T4 as well.

## 2018-01-19 NOTE — Progress Notes (Signed)
[name], MD Phone: [phone]  Zniyah Midkiff Delira is a 72 y.o. female who presents today for f/u.  CC: htn, right shoulder pain  HYPERTENSION  Disease Monitoring  Home BP Monitoring 120s/70s Chest pain- no    Dyspnea- no Medications  Compliance-  Taking amlodipine.  Edema- no  History of thyroid nodule: Patient has a history of a thyroid nodule that was ablated due to being hyperactive many years ago.  Her TSH did trend up a little bit on last check.  Due for recheck of TSH.  Eczema: She has seen dermatology and was diagnosed with nummular eczema.  She continues to have spots pop up on her legs.  She uses the clobetasol and they do eventually go away.  Patient reports her right shoulder has had some discomfort at the insertion point of the deltoid muscle over the last month or so.  Notes it hurts to lift it off to the side.  It is getting better.  She has started back with water aerobics and this has been beneficial.  She reports over the last month she has had a discomfort in her chest that feels like sore muscles.  It is around her costochondral joints.  It only occurs when she twists a certain way.  No exertional symptoms.  No shortness of breath.  No chest pressure.  No injury.  Osteoporosis: DEXA scan reviewed.  No history of fracture.  She does take calcium and vitamin D.  She is been on Fosamax previously though did not tolerate this due to dry mouth.  Patient also reports an area in her left neck in a line along her sternocleidomastoid muscle that is occasionally sore to touch.  It has been going on over the past couple of months.  She had been chewing on the left side more frequently given that she was having a tooth removed on the right.  She discussed this with her dentist and they felt it may have been related to her using that side of her mouth more.  She has subsequently had the tooth removed and is chewing on both sides and it has been improving.  Social History    Tobacco Use  Smoking Status Former Smoker  Smokeless Tobacco Never Used     ROS see history of present illness  Objective  Physical Exam Vitals:   01/19/18 0804  BP: 120/64  Pulse: (!) 58  Temp: 98 F (36.7 C)  SpO2: 96%    BP Readings from Last 3 Encounters:  01/19/18 120/64  08/01/17 130/70  07/27/17 134/68   Wt Readings from Last 3 Encounters:  01/19/18 183 lb 6.4 oz (83.2 kg)  08/01/17 179 lb 3.2 oz (81.3 kg)  07/27/17 178 lb 14.4 oz (81.1 kg)    Physical Exam  Constitutional: No distress.  Cardiovascular: Normal rate, regular rhythm and normal heart sounds.  Pulmonary/Chest: Effort normal and breath sounds normal.  Musculoskeletal: She exhibits no edema.  No tenderness of the right shoulder, no tenderness of the left shoulder, both shoulders have full passive range of motion with discomfort in the right shoulder on abduction, shoulders are symmetric, slight discomfort of bilateral costochondral joints in the chest on palpation, no palpable bony abnormalities, anterior cervical neck with no palpable abnormalities or tenderness, no cervical lymphadenopathy  Neurological: She is alert.  Skin: Skin is warm and dry. She is not diaphoretic.     Assessment/Plan: Please see individual problem list.  Essential hypertension At goal.  Refill amlodipine.  Lab work  as outlined below.  Eczema She has seen dermatology.  She will continue clobetasol cream as needed.  Osteoporosis DEXA scan revealed osteoporosis.  I discussed treatment options with her though she does not want any treatment given intolerance to Fosamax previously.  Encouraged calcium and vitamin D.  Check vitamin D level.  Costochondritis Symptoms and exam most consistent with costochondritis or strain of chest muscles.  No cardiac symptoms.  Discussed monitoring and if worsening letting us know.  Given return precautions.  History of thyroid nodule Follow-up TSH today.  We will check free T3 and free  T4 as well.  Hyperlipidemia Due for LDL check.  Strain of deltoid muscle, right, initial encounter I suspect she is strained her right deltoid muscle.  It is improving.  Discussed ice.  If not continuing to improve she will let us know.  Patient with muscular soreness in the left neck likely related to overuse of that side of her mouth versus sternocleidomastoid muscle strain versus eustachian tube dysfunction.  This has been improving since she has been able to chew on both sides of her mouth.  Given this she will monitor and if not continuing to improve she will let us know.   Orders Placed This Encounter  Procedures  . Comp Met (CMET)  . TSH  . T4, free  . T3, free  . Direct LDL  . Vitamin D (25 hydroxy)    Meds ordered this encounter  Medications  . amLODipine (NORVASC) 5 MG tablet    Sig: Take 1 tablet (5 mg total) by mouth daily.    Dispense:  90 tablet    Refill:  1     [name], MD [place]

## 2018-01-19 NOTE — Patient Instructions (Signed)
Nice to see you. Please continue to stay active and monitor your right shoulder.  If it does not continue to improve please let us know. Please monitor the muscular discomfort in your chest.  If that does not improve please let us know as well.  If you develop chest pressure or shortness of breath please be evaluated immediately. Watch the soreness in your neck as well.  If it does not continue to improve now that you are chewing on both sides please let us know so we can evaluate further. We will get lab work today and contact you with results.

## 2018-01-19 NOTE — Assessment & Plan Note (Signed)
DEXA scan revealed osteoporosis.  I discussed treatment options with her though she does not want any treatment given intolerance to Fosamax previously.  Encouraged calcium and vitamin D.  Check vitamin D level.

## 2018-01-19 NOTE — Assessment & Plan Note (Signed)
Due for LDL check.

## 2018-01-30 ENCOUNTER — Ambulatory Visit: Payer: Medicare Other | Admitting: Family Medicine

## 2018-02-05 DIAGNOSIS — N2581 Secondary hyperparathyroidism of renal origin: Secondary | ICD-10-CM | POA: Diagnosis not present

## 2018-02-05 DIAGNOSIS — I10 Essential (primary) hypertension: Secondary | ICD-10-CM | POA: Diagnosis not present

## 2018-02-05 DIAGNOSIS — N183 Chronic kidney disease, stage 3 (moderate): Secondary | ICD-10-CM | POA: Diagnosis not present

## 2018-02-08 ENCOUNTER — Ambulatory Visit (INDEPENDENT_AMBULATORY_CARE_PROVIDER_SITE_OTHER): Payer: Medicare Other

## 2018-02-08 VITALS — BP 118/64 | HR 60 | Temp 98.3°F | Resp 14 | Ht 67.75 in | Wt 182.0 lb

## 2018-02-08 DIAGNOSIS — Z0000 Encounter for general adult medical examination without abnormal findings: Secondary | ICD-10-CM | POA: Diagnosis not present

## 2018-02-08 NOTE — Patient Instructions (Addendum)
Lindsey Foster , Thank you for taking time to come for your Medicare Wellness Visit. I appreciate your ongoing commitment to your health goals. Please review the following plan we discussed and let me know if I can assist you in the future.   Follow up as needed.    Bring a copy of your [place] and/or Living Will to be scanned into chart.  Have a great day!  These are the goals we discussed: Goals    . Maintain Healthy Lifestyle     Stay active, exercise Stay hydrated Healthy diet       This is a list of the screening recommended for you and due dates:  Health Maintenance  Topic Date Due  . Tetanus Vaccine  02/08/2018*  . Flu Shot  04/03/2018*  . Pneumonia vaccines (1 of 2 - PCV13) 01/11/2019*  .  Hepatitis C: One time screening is recommended by Center for Disease Control  (CDC) for  adults born from [birth year] through 1967.   02/09/2019*  . Mammogram  07/26/2019  . Colon Cancer Screening  09/27/2020  . DEXA scan (bone density measurement)  Completed  *Topic was postponed. The date shown is not the original due date.    Hearing Loss Hearing loss is a partial or total loss of the ability to hear. This can be temporary or permanent, and it can happen in one or both ears. Hearing loss may be referred to as deafness. Medical care is necessary to treat hearing loss properly and to prevent the condition from getting worse. Your hearing may partially or completely come back, depending on what caused your hearing loss and how severe it is. In some cases, hearing loss is permanent. What are the causes? Common causes of hearing loss include:  Too much wax in the ear canal.  Infection of the ear canal or middle ear.  Fluid in the middle ear.  Injury to the ear or surrounding area.  An object stuck in the ear.  Prolonged exposure to loud sounds, such as music.  Less common causes of hearing loss include:  Tumors in the ear.  Viral or bacterial infections,  such as meningitis.  A hole in the eardrum (perforated eardrum).  Problems with the hearing nerve that sends signals between the brain and the ear.  Certain medicines.  What are the signs or symptoms? Symptoms of this condition may include:  Difficulty telling the difference between sounds.  Difficulty following a conversation when there is background noise.  Lack of response to sounds in your environment. This may be most noticeable when you do not respond to startling sounds.  Needing to turn up the volume on the television, radio, etc.  Ringing in the ears.  Dizziness.  Pain in the ears.  How is this diagnosed? This condition is diagnosed based on a physical exam and a hearing test (audiometry). The audiometry test will be performed by a hearing specialist (audiologist). You may also be referred to an ear, nose, and throat (ENT) specialist (otolaryngologist). How is this treated? Treatment for recent onset of hearing loss may include:  Ear wax removal.  Being prescribed medicines to prevent infection (antibiotics).  Being prescribed medicines to reduce inflammation (corticosteroids).  Follow these instructions at home:  If you were prescribed an antibiotic medicine, take it as told by your health care provider. Do not stop taking the antibiotic even if you start to feel better.  Take over-the-counter and prescription medicines only as told by  your health care provider.  Avoid loud noises.  Return to your normal activities as told by your health care provider. Ask your health care provider what activities are safe for you.  Keep all follow-up visits as told by your health care provider. This is important. Contact a health care provider if:  You feel dizzy.  You develop new symptoms.  You vomit or feel nauseous.  You have a fever. Get help right away if:  You develop sudden changes in your vision.  You have severe ear pain.  You have new or increased  weakness.  You have a severe headache. This information is not intended to replace advice given to you by your health care provider. Make sure you discuss any questions you have with your health care provider. Document Released: 08/01/2005 Document Revised: 01/07/2016 Document Reviewed: 12/17/2014 Elsevier Interactive Patient Education  2018 [employer].

## 2018-02-08 NOTE — Progress Notes (Signed)
Subjective:   Lindsey Foster is a 72 y.o. female who presents for Medicare Annual (Subsequent) preventive examination.  Review of Systems:  No ROS.  Medicare Wellness Visit. Additional risk factors are reflected in the social history. Cardiac Risk Factors include: advanced age (>36mn, >>51women);hypertension     Objective:     Vitals: BP 118/64 (BP Location: Left Arm, Patient Position: Sitting, Cuff Size: Normal)   Pulse 60   Temp 98.3 F (36.8 C) (Oral)   Resp 14   Ht 5' 7.75" (1.721 m)   Wt 182 lb (82.6 kg)   SpO2 96%   BMI 27.88 kg/m   Body mass index is 27.88 kg/m.  Advanced Directives 02/08/2018 07/27/2017 02/07/2017 07/25/2016 07/24/2015  Does Patient Have a Medical Advance Directive? Yes Yes Yes Yes Yes  Type of Advance Directive Living will;Healthcare Power of [place]  Does patient want to make changes to medical advance directive? No - Patient declined - No - Patient declined - No - Patient declined  Copy of [place] Chart? No - copy requested - No - copy requested - No - copy requested    Tobacco Social History   Tobacco Use  Smoking Status Former Smoker  Smokeless Tobacco Never Used     Counseling given: Not Answered   Clinical Intake:  Pre-visit preparation completed: Yes  Pain : No/denies pain     Nutritional Status: BMI 25 -29 Overweight Diabetes: No  How often do you need to have someone help you when you read instructions, pamphlets, or other written materials from your doctor or pharmacy?: 1 - Never  Interpreter Needed?: No     Past Medical History:  Diagnosis Date  . Allergic rhinitis   . Anxiety    panic attacks in hot rooms  . Arthritis   . Blood in stool   . Breast cancer ([place] 2006   radiation - Rt  . Chickenpox   . CKD (chronic kidney disease)   . Colon polyps   . Diverticulosis   . GERD  (gastroesophageal reflux disease)   . Heart murmur   . Hyperlipidemia   . Hypertension   . Hypothyroidism   . Multiple thyroid nodules   . Osteopenia   . Personal history of radiation therapy   . Urinary incontinence   . UTI (lower urinary tract infection)   . Vitamin D deficiency disease    Past Surgical History:  Procedure Laterality Date  . BLADDER SURGERY    . BREAST EXCISIONAL BIOPSY Right 2006   positive  . 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 CESAREAN SECTION  1985  . COLONOSCOPY WITH PROPOFOL N/A 09/28/2015   Procedure: COLONOSCOPY WITH PROPOFOL;  Surgeon: MLollie Sails MD;  [facility]  Service: Endoscopy;  Laterality: N/A;  . DILATION AND CURETTAGE OF UTERUS  2012  . Thyroid nodule ablation  2003  . Uterine polyp removal  2009    Family History  Problem Relation Age of Onset  . Heart disease Unknown        Parent  . Kidney disease Unknown        Parent  . Uterine cancer Maternal Aunt   . Kidney disease Mother   . Angina Father   . Heart attack Father   . Multiple myeloma Brother   . Breast cancer Neg Hx    Social History   Socioeconomic History  .  Marital status: Married    Spouse name: Not on file  . Number of children: Not on file  . Years of education: Not on file  . Highest education level: Not on file  Occupational History  . Not on file  Social Needs  . Financial resource strain: Not hard at all  . Food insecurity:    Worry: Never true    Inability: Never true  . Transportation needs:    Medical: No    Non-medical: No  Tobacco Use  . Smoking status: Former Research scientist (life sciences)  . Smokeless tobacco: Never Used  Substance and Sexual Activity  . Alcohol use: No    Alcohol/week: 0.0 oz  . Drug use: No  . Sexual activity: Not on file  Lifestyle  . Physical activity:    Days per week: 2 days    Minutes per session: 60 min  . Stress: Not at all  Relationships  . Social connections:    Talks on phone: Not on file    Gets together: Not  on file    Attends religious service: Not on file    Active member of club or organization: Not on file    Attends meetings of clubs or organizations: Not on file    Relationship status: Not on file  Other Topics Concern  . Not on file  Social History Narrative  . Not on file    Outpatient Encounter Medications as of 02/08/2018  Medication Sig  . amLODipine (NORVASC) 5 MG tablet Take 1 tablet (5 mg total) by mouth daily.  . Azelastine HCl 0.15 % SOLN   . Calcium Citrate-Vitamin D3 1000-400 LIQD Take by mouth. Reported on 02/04/2016  . clobetasol cream (TEMOVATE) 0.05 % Apply topically 2 (two) times daily. For 2 weeks, then just on week days  . ketotifen (ZADITOR) 0.025 % ophthalmic solution 1 drop 2 (two) times daily.  Marland Kitchen loratadine (CLARITIN) 10 MG tablet Take 1 tablet (10 mg total) by mouth daily.  . mometasone (ELOCON) 0.1 % lotion Apply topically daily.  . ranitidine (ZANTAC) 150 MG tablet Take 1 tablet (150 mg total) by mouth 2 (two) times daily.   No facility-administered encounter medications on file as of 02/08/2018.     Activities of Daily Living In your present state of health, do you have any difficulty performing the following activities: 02/08/2018  Hearing? N  Vision? N  Difficulty concentrating or making decisions? N  Walking or climbing stairs? N  Comment Uses cane as needed when her L knee feels weakend from overuse.  Dressing or bathing? N  Doing errands, shopping? N  Preparing Food and eating ? N  Using the Toilet? N  In the past six months, have you accidently leaked urine? N  Do you have problems with loss of bowel control? N  Managing your Medications? N  Managing your Finances? N  Housekeeping or managing your Housekeeping? N  Some recent data might be hidden    Patient Care Team: [name], MD as PCP - General (Family Medicine)    Assessment:   This is a routine wellness examination for [place].  The goal of the wellness visit is to assist  the patient how to close the gaps in care and create a preventative care plan for the patient.   The roster of all physicians providing medical care to patient is listed in the Snapshot section of the chart.  Taking calcium VIT D as appropriate/Osteoporosis reviewed.    Safety issues reviewed; Smoke  and carbon monoxide detectors in the home. No firearms in the home. Wears seatbelts when driving or riding with others. No violence in the home.  They do not have excessive sun exposure.  Discussed the need for sun protection: hats, long sleeves and the use of sunscreen if there is significant sun exposure.  Patient is alert, normal appearance, oriented to person/place/and time. Correctly identified the president of the [place] and recalls of 3/3 words.Performs simple calculations and can read correct time from watch face. Displays appropriate judgement.  No new identified risk were noted.  No failures at ADL's or IADL's.  Ambulates with cane as needed.   BMI- discussed the importance of a healthy diet, water intake and the benefits of aerobic exercise. Educational material provided.   24 hour diet recall: Low carb diet.   Dental- every 4 months.  Eye- Visual acuity not assessed per patient preference since they have regular follow up with the ophthalmologist.  Wears corrective lenses.  Sleep patterns- Sleeps without issues.   Hep C screening declined.   Health maintenance gaps- closed.  Patient Concerns: Dry mouth; chronic.  Educational material provided for dry mouth tooth paste/gel, rinse and lozenge that may be helpful.  Follow up with your primary care provider and dentist if condition worsens or does not improve.    Exercise Activities and Dietary recommendations Current Exercise Habits: Structured exercise class, Time (Minutes): 60, Frequency (Times/Week): 2, Weekly Exercise (Minutes/Week): 120, Intensity: Moderate  Goals    . Maintain Healthy Lifestyle     Stay active,  exercise Stay hydrated Healthy diet       Fall Risk Fall Risk  02/08/2018 01/19/2018 02/07/2017 08/11/2016  Falls in the past year? No No No No   Depression Screen PHQ 2/9 Scores 02/08/2018 12/13/2016 08/11/2016  PHQ - 2 Score 0 0 0  PHQ- 9 Score - 0 -     Cognitive Function MMSE - Mini Mental State Exam 02/08/2018 02/07/2017  Orientation to time 5 5  Orientation to Place 5 5  Registration 3 3  Attention/ Calculation 5 5  Recall 3 3  Language- name 2 objects 2 2  Language- repeat 1 1  Language- follow 3 step command 3 3  Language- read & follow direction 1 1  Write a sentence 1 1  Copy design 1 1  Total score 30 30         There is no immunization history on file for this patient.  Screening Tests Health Maintenance  Topic Date Due  . TETANUS/TDAP  02/08/2018 (Originally 11/17/1964)  . INFLUENZA VACCINE  04/03/2018 (Originally 03/15/2018)  . PNA vac Low Risk Adult (1 of 2 - PCV13) 01/11/2019 (Originally 11/16/2008)  . Hepatitis C Screening  02/09/2019 (Originally 08-23-1945)  . MAMMOGRAM  07/26/2019  . COLONOSCOPY  09/27/2020  . DEXA SCAN  Completed      Plan:    End of life planning; Advance aging; Advanced directives discussed. Copy of current HCPOA/Living Will requested.    I have personally reviewed and noted the following in the patient's chart:   . Medical and social history . Use of alcohol, tobacco or illicit drugs  . Current medications and supplements . Functional ability and status . Nutritional status . Physical activity . Advanced directives . List of other physicians . Hospitalizations, surgeries, and ER visits in previous 12 months . Vitals . Screenings to include cognitive, depression, and falls . Referrals and appointments  In addition, I have reviewed and discussed with patient certain  preventive protocols, quality metrics, and best practice recommendations. A written personalized care plan for preventive services as well as general preventive  health recommendations were provided to patient.     Varney Biles, LPN  2/54/2706

## 2018-03-03 ENCOUNTER — Other Ambulatory Visit: Payer: Self-pay | Admitting: Family Medicine

## 2018-03-08 DIAGNOSIS — R0602 Shortness of breath: Secondary | ICD-10-CM | POA: Diagnosis not present

## 2018-03-08 DIAGNOSIS — R002 Palpitations: Secondary | ICD-10-CM | POA: Diagnosis not present

## 2018-03-08 DIAGNOSIS — I10 Essential (primary) hypertension: Secondary | ICD-10-CM | POA: Diagnosis not present

## 2018-05-26 IMAGING — MG MM DIGITAL SCREENING BILAT W/ TOMO W/ CAD
9 of 13 series · 9 of 29 positions shown · non-contrast
Comparison: Previous exam(s).

CLINICAL DATA: Screening.

EXAM:
2D DIGITAL SCREENING BILATERAL MAMMOGRAM WITH CAD AND ADJUNCT TOMO

[R CC (1 of 2)]
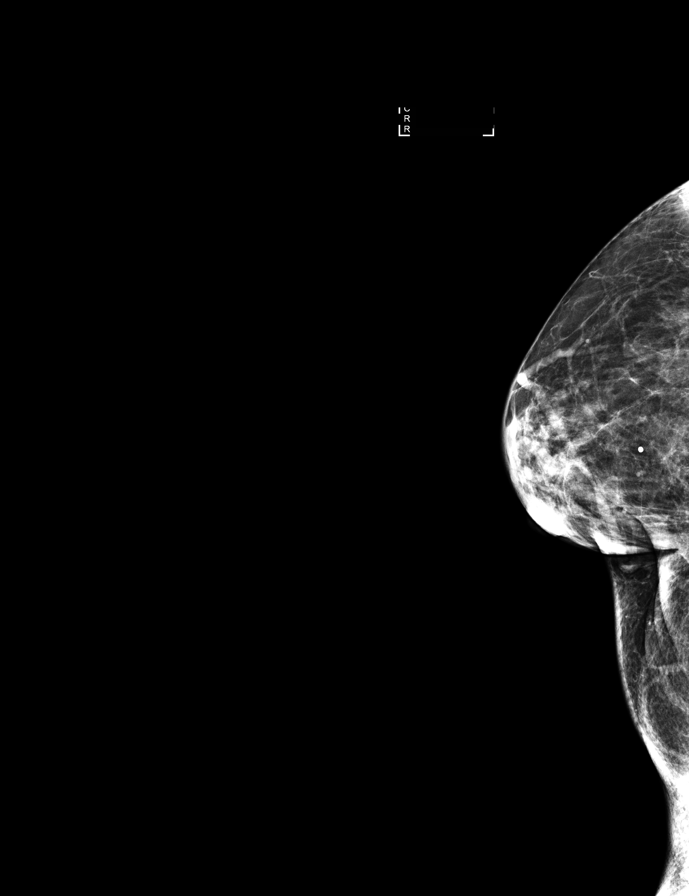

[L MLO synth-2D]
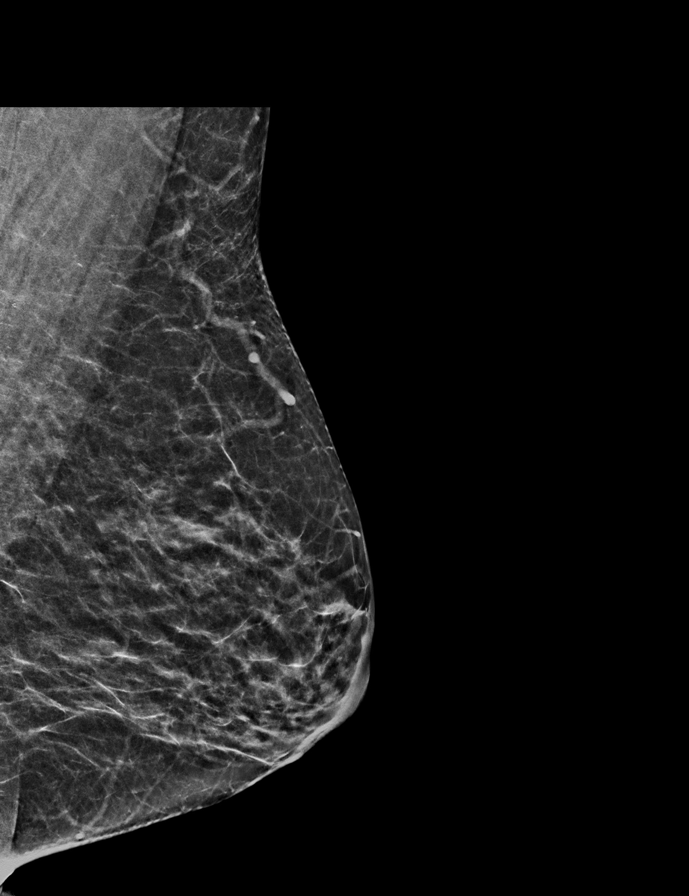

[R MLO]
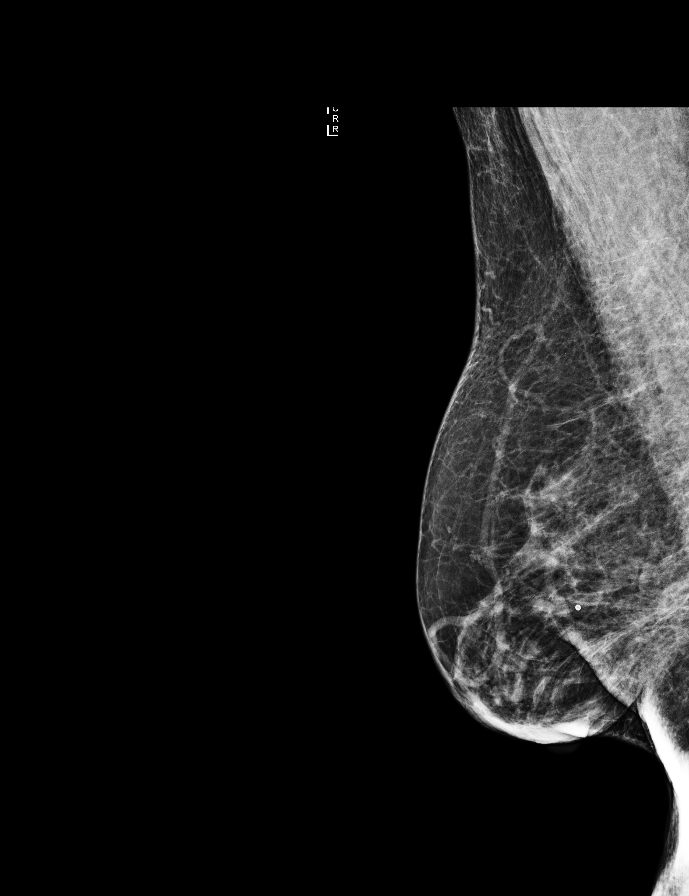

[R CC synth-2D]
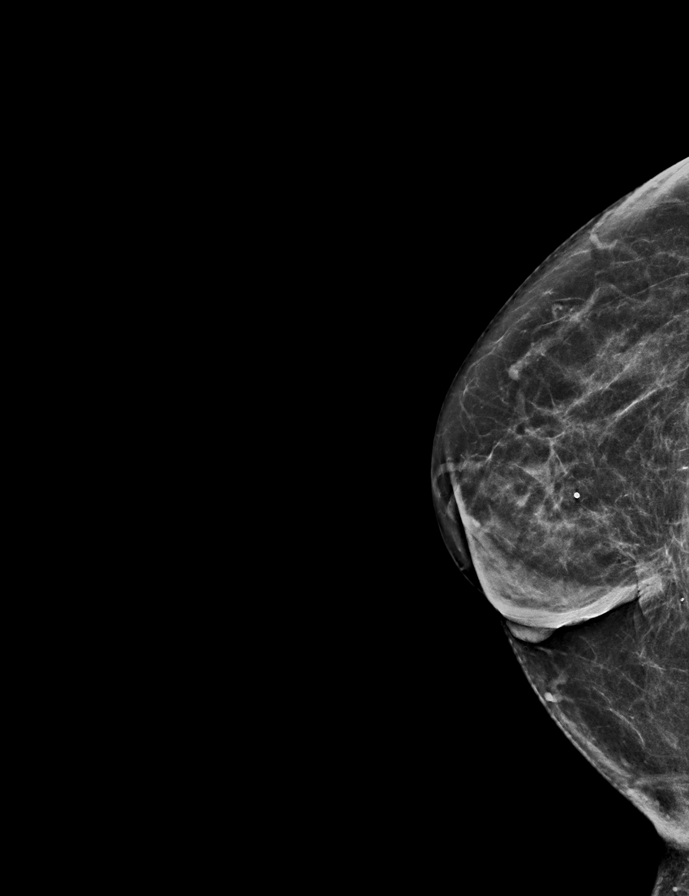

[R CC (2 of 2)]
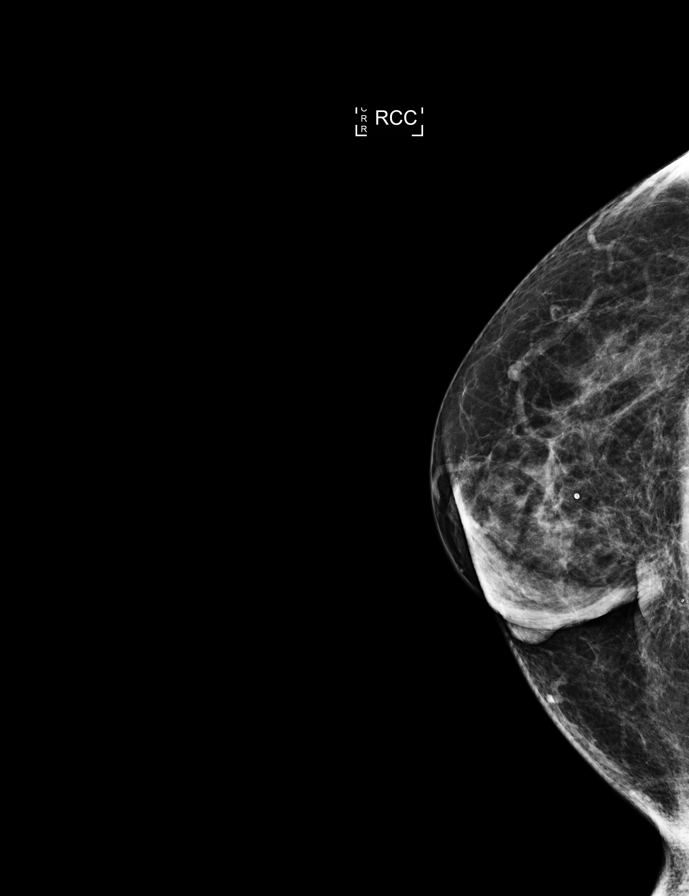

[R MLO synth-2D]
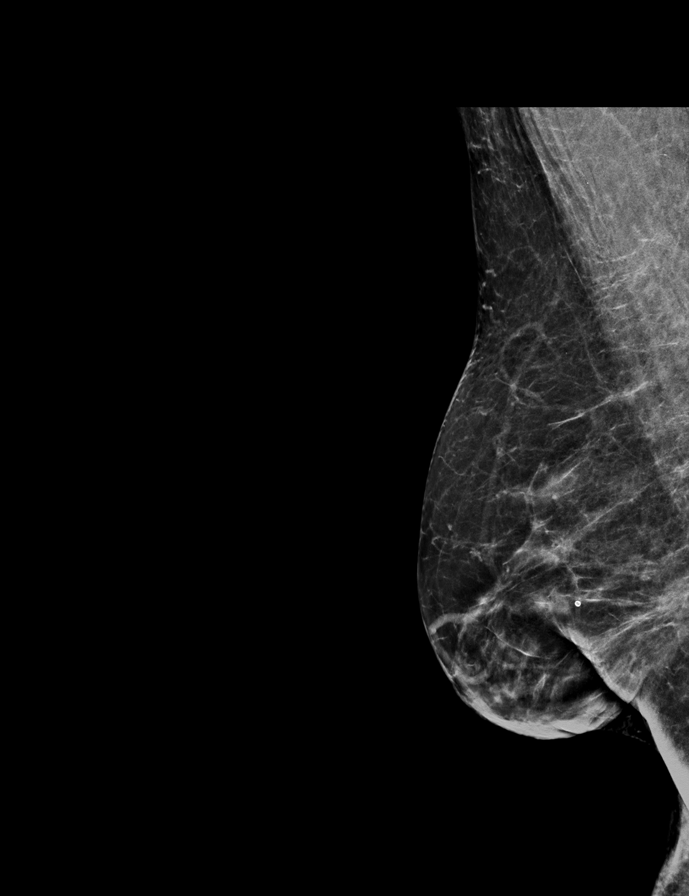

[L MLO]
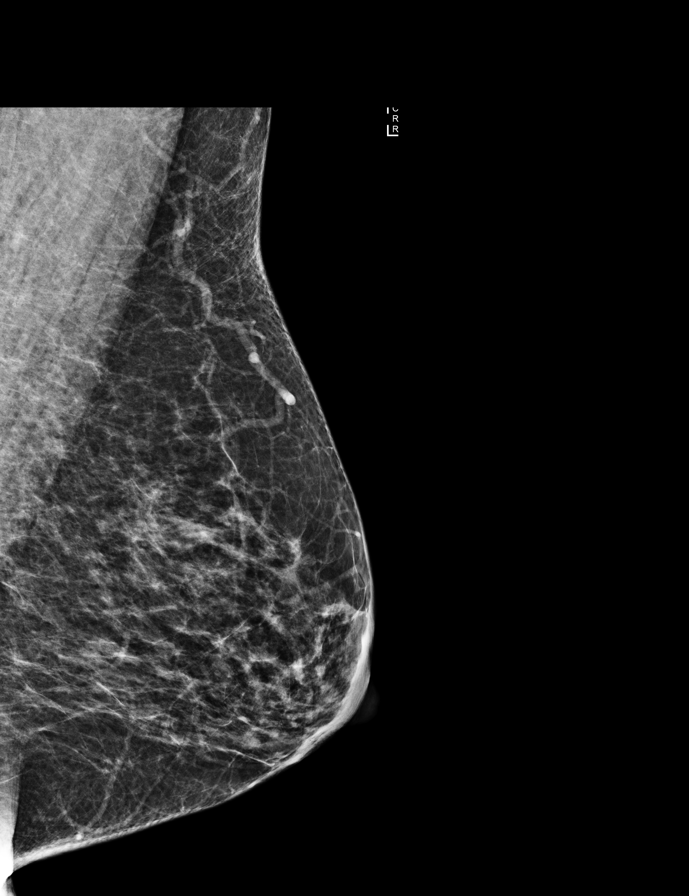

[L CC]
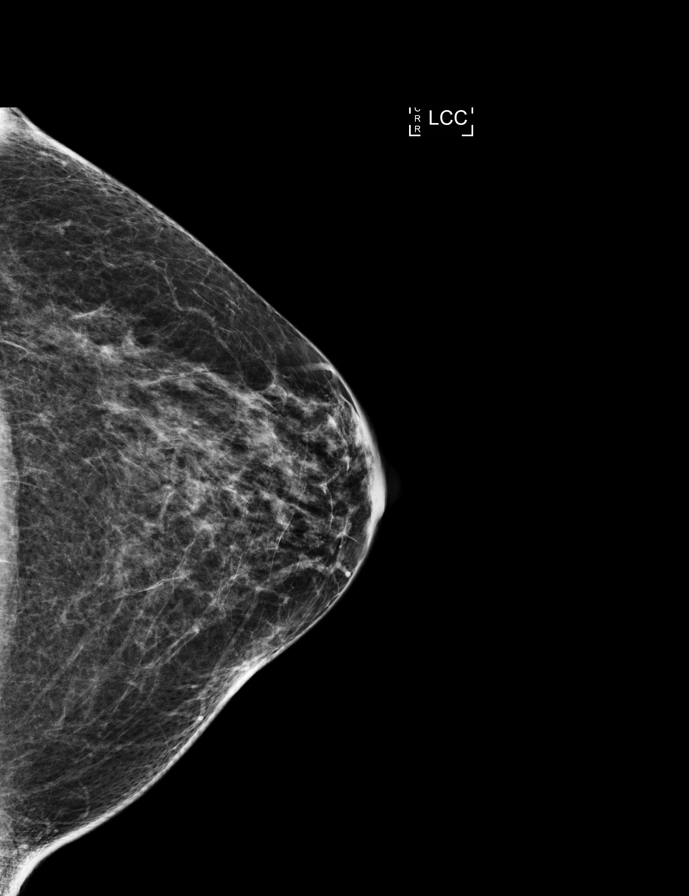

[L CC synth-2D]
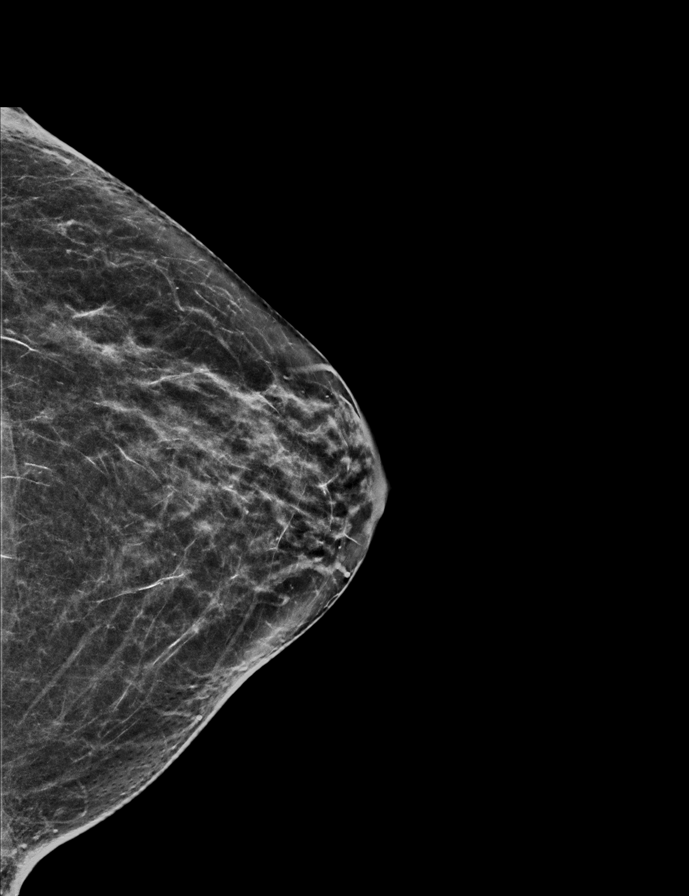

[9 of 29 positions shown; findings below may reference images not displayed]

ACR Breast Density Category b: There are scattered areas of
fibroglandular density.
FINDINGS: There are no findings suspicious for malignancy. Images were
processed with CAD.
IMPRESSION: No mammographic evidence of malignancy. A result letter of this
screening mammogram will be mailed directly to the patient.

RECOMMENDATION:
Screening mammogram in one year. (Code:97-6-RS4)

BI-RADS CATEGORY  1: Negative.

## 2018-06-14 DIAGNOSIS — I10 Essential (primary) hypertension: Secondary | ICD-10-CM | POA: Diagnosis not present

## 2018-06-14 DIAGNOSIS — N2581 Secondary hyperparathyroidism of renal origin: Secondary | ICD-10-CM | POA: Diagnosis not present

## 2018-06-14 DIAGNOSIS — N183 Chronic kidney disease, stage 3 (moderate): Secondary | ICD-10-CM | POA: Diagnosis not present

## 2018-07-24 ENCOUNTER — Encounter: Payer: Self-pay | Admitting: Family Medicine

## 2018-07-24 ENCOUNTER — Ambulatory Visit (INDEPENDENT_AMBULATORY_CARE_PROVIDER_SITE_OTHER): Payer: Medicare Other | Admitting: Family Medicine

## 2018-07-24 ENCOUNTER — Other Ambulatory Visit: Payer: Self-pay | Admitting: Family Medicine

## 2018-07-24 VITALS — BP 108/70 | HR 62 | Temp 98.0°F | Ht 67.75 in | Wt 183.8 lb

## 2018-07-24 DIAGNOSIS — R1310 Dysphagia, unspecified: Secondary | ICD-10-CM | POA: Diagnosis not present

## 2018-07-24 DIAGNOSIS — E785 Hyperlipidemia, unspecified: Secondary | ICD-10-CM | POA: Diagnosis not present

## 2018-07-24 DIAGNOSIS — I10 Essential (primary) hypertension: Secondary | ICD-10-CM | POA: Diagnosis not present

## 2018-07-24 DIAGNOSIS — R059 Cough, unspecified: Secondary | ICD-10-CM

## 2018-07-24 DIAGNOSIS — L309 Dermatitis, unspecified: Secondary | ICD-10-CM

## 2018-07-24 DIAGNOSIS — K219 Gastro-esophageal reflux disease without esophagitis: Secondary | ICD-10-CM | POA: Diagnosis not present

## 2018-07-24 DIAGNOSIS — R05 Cough: Secondary | ICD-10-CM

## 2018-07-24 LAB — COMPREHENSIVE METABOLIC PANEL
ALBUMIN: 4.3 g/dL (ref 3.5–5.2)
ALK PHOS: 54 U/L (ref 39–117)
ALT: 14 U/L (ref 0–35)
AST: 17 U/L (ref 0–37)
BUN: 16 mg/dL (ref 6–23)
CALCIUM: 9.8 mg/dL (ref 8.4–10.5)
CO2: 32 mEq/L (ref 19–32)
CREATININE: 1.25 mg/dL — AB (ref 0.40–1.20)
Chloride: 105 mEq/L (ref 96–112)
GFR: 53.78 mL/min — ABNORMAL LOW (ref >60.00)
Glucose, Bld: 96 mg/dL (ref 70–99)
Potassium: 4.4 mEq/L (ref 3.5–5.1)
Sodium: 141 mEq/L (ref 135–145)
Total Bilirubin: 0.5 mg/dL (ref 0.2–1.2)
Total Protein: 7.4 g/dL (ref 6.0–8.3)

## 2018-07-24 LAB — LDL CHOLESTEROL, DIRECT: Direct LDL: 127 mg/dL

## 2018-07-24 MED ORDER — FAMOTIDINE 20 MG PO TABS: 20.0000 mg | ORAL_TABLET | Freq: Two times a day (BID) | ORAL | 1 refills | Status: DC

## 2018-07-24 NOTE — Progress Notes (Signed)
[name], MD Phone: [phone]  Lindsey Foster is a 72 y.o. female who presents today for follow-up.  CC: Hypertension, GERD, rash, dry cough  Hypertension: Taking amlodipine.  No chest pain, shortness of breath, or edema.  Occasionally feels slightly lightheaded.  GERD: Taking no medication currently.  She does note occasional reflux sensation though no sour taste.  She does note dysphagia where things will stick in the middle of her chest when she swallows.  No blood in her stool.  No abdominal pain.  She is occasionally taking Tums.  Some mild constipation previously though that has resolved.  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  She has been evaluated by dermatology.  She uses clobetasol as needed for this.  She has 1 large spot on her anterior lower right leg.  Dry cough: Patient notes about a month ago she started with dry cough.  Feels like there is phlegm in her throat.  She did stop Zantac around that time.  She does note occasional sinus congestion with rhinorrhea.  She does sneeze if she coughs a lot.  No postnasal drip.  No hemoptysis.  Social History   Tobacco Use  Smoking Status Former Smoker  Smokeless Tobacco Never Used     ROS see history of present illness  Objective  Physical Exam Vitals:   07/24/18 1011  BP: 108/70  Pulse: 62  Temp: 98 F (36.7 C)  SpO2: 95%    BP Readings from Last 3 Encounters:  07/24/18 108/70  02/08/18 118/64  01/19/18 120/64   Wt Readings from Last 3 Encounters:  07/24/18 183 lb 12.8 oz (83.4 kg)  02/08/18 182 lb (82.6 kg)  01/19/18 183 lb 6.4 oz (83.2 kg)    Physical Exam  Constitutional: No distress.  Cardiovascular: Normal rate, regular rhythm and normal heart sounds.  Pulmonary/Chest: Effort normal and breath sounds normal.  Abdominal: Soft. Bowel sounds are normal. She exhibits no distension. There is no tenderness. There is no rebound and no guarding.  Musculoskeletal: She exhibits no  edema.  Neurological: She is alert.  Skin: Skin is warm and dry. She is not diaphoretic.        Assessment/Plan: Please see individual problem list.  Essential hypertension Well-controlled.  Continue current medication.  GERD (gastroesophageal reflux disease) I suspect her cough is related from reflux.  She also reports some dysphagia.  Will refer to GI.  She reports having had an adverse reaction to Prilosec in the past.  We will trial Pepcid to see if that would be beneficial.  Eczema Continue as needed clobetasol.  Cough This potentially could be related to her reflux though could also be related to allergy symptoms.  We will treat her reflux.  We will trial Flonase as well for allergy symptoms.  If not improving she will let us know.   Orders Placed This Encounter  Procedures  . Direct LDL  . Comp Met (CMET)  . Ambulatory referral to Gastroenterology    Referral Priority:   Routine    Referral Type:   Consultation    Referral Reason:   Specialty Services Required    Number of Visits Requested:   1    Meds ordered this encounter  Medications  . DISCONTD: famotidine (PEPCID) 20 MG tablet    Sig: Take 1 tablet (20 mg total) by mouth 2 (two) times daily before a meal.    Dispense:  60 tablet    Refill:  1     Randall Hiss  Caryl Bis, MD [place]

## 2018-07-24 NOTE — Assessment & Plan Note (Signed)
Continue as needed clobetasol.

## 2018-07-24 NOTE — Assessment & Plan Note (Signed)
This potentially could be related to her reflux though could also be related to allergy symptoms.  We will treat her reflux.  We will trial Flonase as well for allergy symptoms.  If not improving she will let us know.

## 2018-07-24 NOTE — Patient Instructions (Signed)
Nice to see. We will check lab work today and contact you with the results. Please let us know when you need a refill on the clobetasol. Please start on the Pepcid for your reflux.  This will need to be taken twice daily and taken 30 minutes prior to a meal.  Please see if this helps with your cough.  You should also try Flonase to see if that helps with your cough. We will refer you to GI to consider an endoscopy.

## 2018-07-24 NOTE — Assessment & Plan Note (Signed)
I suspect her cough is related from reflux.  She also reports some dysphagia.  Will refer to GI.  She reports having had an adverse reaction to Prilosec in the past.  We will trial Pepcid to see if that would be beneficial.

## 2018-07-24 NOTE — Progress Notes (Deleted)
Acute Office Visit  Subjective:    Patient ID: Lindsey Foster, female    DOB: 1945/10/11, 72 y.o.   MRN: [id number]  Chief Complaint  Patient presents with  . 6 month follow up    HPI Patient is in today for ***  Past Medical History:  Diagnosis Date  . Allergic rhinitis   . Anxiety    panic attacks in hot rooms  . Arthritis   . Blood in stool   . Breast cancer ([place]) 2006   radiation - Rt  . Chickenpox   . CKD (chronic kidney disease)   . Colon polyps   . Diverticulosis   . GERD (gastroesophageal reflux disease)   . Heart murmur   . Hyperlipidemia   . Hypertension   . Hypothyroidism   . Multiple thyroid nodules   . Osteopenia   . Personal history of radiation therapy   . Urinary incontinence   . UTI (lower urinary tract infection)   . Vitamin D deficiency disease     Past Surgical History:  Procedure Laterality Date  . BLADDER SURGERY    . BREAST EXCISIONAL BIOPSY Right 2006   positive  . 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 CESAREAN SECTION  1985  . COLONOSCOPY WITH PROPOFOL N/A 09/28/2015   Procedure: COLONOSCOPY WITH PROPOFOL;  Surgeon: Lollie Sails, MD;  [facility] ENDOSCOPY;  Service: Endoscopy;  Laterality: N/A;  . DILATION AND CURETTAGE OF UTERUS  2012  . Thyroid nodule ablation  2003  . Uterine polyp removal  2009     Family History  Problem Relation Age of Onset  . Heart disease Unknown        Parent  . Kidney disease Unknown        Parent  . Uterine cancer Maternal Aunt   . Kidney disease Mother   . Angina Father   . Heart attack Father   . Multiple myeloma Brother   . Breast cancer Neg Hx     Social History   Socioeconomic History  . Marital status: Married    Spouse name: Not on file  . Number of children: Not on file  . Years of education: Not on file  . Highest education level: Not on file  Occupational History  . Not on file  Social Needs  . Financial resource strain: Not hard at all  . Food insecurity:   Worry: Never true    Inability: Never true  . Transportation needs:    Medical: No    Non-medical: No  Tobacco Use  . Smoking status: Former Research scientist (life sciences)  . Smokeless tobacco: Never Used  Substance and Sexual Activity  . Alcohol use: No    Alcohol/week: 0.0 standard drinks  . Drug use: No  . Sexual activity: Not on file  Lifestyle  . Physical activity:    Days per week: 2 days    Minutes per session: 60 min  . Stress: Not at all  Relationships  . Social connections:    Talks on phone: Not on file    Gets together: Not on file    Attends religious service: Not on file    Active member of club or organization: Not on file    Attends meetings of clubs or organizations: Not on file    Relationship status: Not on file  . Intimate partner violence:    Fear of current or ex partner: Not on file    Emotionally abused: Not on file  Physically abused: Not on file    Forced sexual activity: Not on file  Other Topics Concern  . Not on file  Social History Narrative  . Not on file    Outpatient Medications Prior to Visit  Medication Sig Dispense Refill  . amLODipine (NORVASC) 5 MG tablet Take 1 tablet (5 mg total) by mouth daily. 90 tablet 1  . Azelastine HCl 0.15 % SOLN     . Calcium Citrate-Vitamin D3 1000-400 LIQD Take by mouth. Reported on 02/04/2016    . clobetasol cream (TEMOVATE) 0.05 % Apply topically 2 (two) times daily. For 2 weeks, then just on week days 60 g 1  . ketotifen (ZADITOR) 0.025 % ophthalmic solution 1 drop 2 (two) times daily.    Marland Kitchen loratadine (CLARITIN) 10 MG tablet Take 1 tablet (10 mg total) by mouth daily. 30 tablet 11  . mometasone (ELOCON) 0.1 % lotion Apply topically daily.    . ranitidine (ZANTAC) 150 MG tablet Take 1 tablet (150 mg total) by mouth 2 (two) times daily. (Patient not taking: Reported on 07/24/2018) 60 tablet 1   No facility-administered medications prior to visit.     Allergies  Allergen Reactions  . Latex Rash    ROS       Objective:    Physical Exam  BP 108/70   Pulse 62   Temp 98 F (36.7 C) (Oral)   Ht 5' 7.75" (1.721 m)   Wt 183 lb 12.8 oz (83.4 kg)   SpO2 95%   BMI 28.15 kg/m  Wt Readings from Last 3 Encounters:  07/24/18 183 lb 12.8 oz (83.4 kg)  02/08/18 182 lb (82.6 kg)  01/19/18 183 lb 6.4 oz (83.2 kg)    Health Maintenance Due  Topic Date Due  . TETANUS/TDAP  11/17/1962    There are no preventive care reminders to display for this patient.   Lab Results  Component Value Date   TSH 3.10 01/19/2018   Lab Results  Component Value Date   WBC 3.2 (L) 07/27/2017   HGB 12.3 07/27/2017   HCT 37.9 07/27/2017   MCV 84.5 07/27/2017   PLT 182 07/27/2017   Lab Results  Component Value Date   NA 141 01/19/2018   K 4.4 01/19/2018   CO2 30 01/19/2018   GLUCOSE 100 (H) 01/19/2018   BUN 16 01/19/2018   CREATININE 1.19 01/19/2018   BILITOT 0.4 01/19/2018   ALKPHOS 54 01/19/2018   AST 18 01/19/2018   ALT 16 01/19/2018   PROT 7.4 01/19/2018   ALBUMIN 4.2 01/19/2018   CALCIUM 9.9 01/19/2018   ANIONGAP 7 07/27/2017   GFR 57.00 (L) 01/19/2018   Lab Results  Component Value Date   CHOL 220 (H) 08/22/2017   Lab Results  Component Value Date   HDL 74.60 08/22/2017   Lab Results  Component Value Date   LDLCALC 135 (H) 08/22/2017   Lab Results  Component Value Date   TRIG 52.0 08/22/2017   Lab Results  Component Value Date   CHOLHDL 3 08/22/2017   Lab Results  Component Value Date   HGBA1C 6.2 05/12/2016       Assessment & Plan:   Problem List Items Addressed This Visit    Essential hypertension - Primary   Relevant Orders   Comp Met (CMET)   Hyperlipidemia   Relevant Orders   Direct LDL    Other Visit Diagnoses    Dysphagia, unspecified type       Relevant Orders  Ambulatory referral to Gastroenterology       Meds ordered this encounter  Medications  . famotidine (PEPCID) 20 MG tablet    Sig: Take 1 tablet (20 mg total) by mouth 2 (two) times  daily before a meal.    Dispense:  60 tablet    Refill:  1     [name], MD

## 2018-07-24 NOTE — Assessment & Plan Note (Signed)
Well controlled. Continue current medication.  

## 2018-07-27 ENCOUNTER — Ambulatory Visit
Admission: RE | Admit: 2018-07-27 | Discharge: 2018-07-27 | Disposition: A | Discharge status: Home / Self Care | Payer: Medicare Other | Source: Ambulatory Visit | Attending: Urgent Care | Admitting: Urgent Care

## 2018-07-27 DIAGNOSIS — Z1231 Encounter for screening mammogram for malignant neoplasm of breast: Secondary | ICD-10-CM | POA: Diagnosis not present

## 2018-07-27 DIAGNOSIS — D0511 Intraductal carcinoma in situ of right breast: Secondary | ICD-10-CM | POA: Insufficient documentation

## 2018-07-31 ENCOUNTER — Inpatient Hospital Stay: Payer: Medicare Other | Attending: Hematology and Oncology

## 2018-07-31 ENCOUNTER — Inpatient Hospital Stay (HOSPITAL_BASED_OUTPATIENT_CLINIC_OR_DEPARTMENT_OTHER): Payer: Medicare Other | Admitting: Hematology and Oncology

## 2018-07-31 ENCOUNTER — Telehealth: Payer: Self-pay | Admitting: *Deleted

## 2018-07-31 ENCOUNTER — Encounter: Payer: Self-pay | Admitting: Hematology and Oncology

## 2018-07-31 VITALS — BP 148/80 | HR 62 | Temp 96.6°F | Resp 18 | Wt 184.0 lb

## 2018-07-31 DIAGNOSIS — M25562 Pain in left knee: Secondary | ICD-10-CM

## 2018-07-31 DIAGNOSIS — Z923 Personal history of irradiation: Secondary | ICD-10-CM | POA: Diagnosis not present

## 2018-07-31 DIAGNOSIS — Z87891 Personal history of nicotine dependence: Secondary | ICD-10-CM | POA: Diagnosis not present

## 2018-07-31 DIAGNOSIS — M810 Age-related osteoporosis without current pathological fracture: Secondary | ICD-10-CM | POA: Diagnosis not present

## 2018-07-31 DIAGNOSIS — Z86000 Personal history of in-situ neoplasm of breast: Secondary | ICD-10-CM

## 2018-07-31 DIAGNOSIS — Z808 Family history of malignant neoplasm of other organs or systems: Secondary | ICD-10-CM

## 2018-07-31 DIAGNOSIS — G8929 Other chronic pain: Secondary | ICD-10-CM | POA: Insufficient documentation

## 2018-07-31 DIAGNOSIS — Z8049 Family history of malignant neoplasm of other genital organs: Secondary | ICD-10-CM | POA: Diagnosis not present

## 2018-07-31 DIAGNOSIS — D0511 Intraductal carcinoma in situ of right breast: Secondary | ICD-10-CM

## 2018-07-31 DIAGNOSIS — Z1231 Encounter for screening mammogram for malignant neoplasm of breast: Secondary | ICD-10-CM

## 2018-07-31 LAB — CBC WITH DIFFERENTIAL/PLATELET
Abs Immature Granulocytes: 0.01 10*3/uL (ref 0.00–0.07)
Basophils Absolute: 0 10*3/uL (ref 0.0–0.1)
Basophils Relative: 1 %
Eosinophils Absolute: 0.1 10*3/uL (ref 0.0–0.5)
Eosinophils Relative: 4 %
HCT: 38.3 % (ref 36.0–46.0)
Hemoglobin: 11.9 g/dL — ABNORMAL LOW (ref 12.0–15.0)
Immature Granulocytes: 0 %
Lymphocytes Relative: 34 %
Lymphs Abs: 1.4 10*3/uL (ref 0.7–4.0)
MCH: 26.6 pg (ref 26.0–34.0)
MCHC: 31.1 g/dL (ref 30.0–36.0)
MCV: 85.7 fL (ref 80.0–100.0)
Monocytes Absolute: 0.5 10*3/uL (ref 0.1–1.0)
Monocytes Relative: 12 %
Neutro Abs: 2 10*3/uL (ref 1.7–7.7)
Neutrophils Relative %: 49 %
Platelets: 195 10*3/uL (ref 150–400)
RBC: 4.47 MIL/uL (ref 3.87–5.11)
RDW: 14.6 % (ref 11.5–15.5)
WBC: 4 10*3/uL (ref 4.0–10.5)
nRBC: 0 % (ref 0.0–0.2)

## 2018-07-31 LAB — COMPREHENSIVE METABOLIC PANEL
ALT: 18 U/L (ref 0–44)
AST: 19 U/L (ref 15–41)
Albumin: 4.1 g/dL (ref 3.5–5.0)
Alkaline Phosphatase: 53 U/L (ref 38–126)
Anion gap: 6 (ref 5–15)
BUN: 15 mg/dL (ref 8–23)
CO2: 28 mmol/L (ref 22–32)
Calcium: 9.6 mg/dL (ref 8.9–10.3)
Chloride: 107 mmol/L (ref 98–111)
Creatinine, Ser: 1.13 mg/dL — ABNORMAL HIGH (ref 0.44–1.00)
GFR calc Af Amer: 55 mL/min — ABNORMAL LOW (ref >60)
GFR calc non Af Amer: 48 mL/min — ABNORMAL LOW (ref >60)
Glucose, Bld: 101 mg/dL — ABNORMAL HIGH (ref 70–99)
Potassium: 4.2 mmol/L (ref 3.5–5.1)
Sodium: 141 mmol/L (ref 135–145)
Total Bilirubin: 0.4 mg/dL (ref 0.3–1.2)
Total Protein: 7.4 g/dL (ref 6.5–8.1)

## 2018-07-31 NOTE — Progress Notes (Signed)
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  At that time, she was doing well.  Exam was unremarkable. Labs were stable.  Bone density on 09/05/2017 revealed osteoporosis with a T-score of -2.8 in the left femur and -2.8 in the AP spine L1-L4  Bilateral screening mammogram on 07/27/2018 revealed no evidence of malignancy.  During the interim, patient has been doing well. She denies any acute concerns. She complains of chronic pain in her LEFT knee, presumably due to arthritis.   Patient notes that she has been "doing lots of traveling and enjoying life". She is working on her "bucket list".  She has gone hot air ballooning in the [facility].   She plans to jump out of a plane (parachute).    She denies that she has experienced any B symptoms. She denies any interval infections.  Patient does not verbalize any concerns with regards to her breasts today. Patient performs monthly self breast examinations as recommended.   Patient advises that she maintains an adequate appetite. She is eating well. Weight today is 184 pounds, which compared to her last visit to the clinic, represents a 6 pound increase.   Patient denies pain in the clinic today.   Past Medical History:  Diagnosis Date  . Allergic rhinitis   . Anxiety    panic attacks in hot rooms  . Arthritis   . Blood in stool   . Breast cancer ([place]) 2006   radiation - Rt  . Chickenpox   . CKD (chronic kidney disease)   . Colon polyps   . Diverticulosis   . GERD (gastroesophageal reflux disease)   . Heart murmur   . Hyperlipidemia   . Hypertension   . Hypothyroidism   . Multiple thyroid nodules   . Osteopenia   . Personal history of radiation therapy   . Urinary incontinence   . UTI (lower  urinary tract infection)   . Vitamin D deficiency disease     Past Surgical History:  Procedure Laterality Date  . BLADDER SURGERY    . BREAST EXCISIONAL BIOPSY Right 2006   positive  . 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 CESAREAN SECTION  1985  . COLONOSCOPY WITH PROPOFOL N/A 09/28/2015   Procedure: COLONOSCOPY WITH PROPOFOL;  Surgeon: Lollie Sails, MD;  Location: [facility] ENDOSCOPY;  Service: Endoscopy;  Laterality: N/A;  . DILATION AND CURETTAGE OF UTERUS  2012  . Thyroid nodule ablation  2003  . Uterine polyp removal  2009     Family History  Problem Relation Age of Onset  . Heart disease Other        Parent  . Kidney disease Other        Parent  . Uterine cancer Maternal Aunt   . Kidney disease Mother   . Angina Father   . Heart attack Father   . Multiple myeloma Brother   . Breast cancer Neg Hx     Social History:  reports that she has quit smoking. She has never used smokeless tobacco. She reports that she does not drink alcohol or use drugs.  She is originally from [place].  She is working on her "bucket list".  She has gone hot air ballooning in the  [facility].   She plans to jump out of a plane (parachute).  The patient is alone today.  Allergies:  Allergies  Allergen Reactions  . Latex Rash    Current Medications: Current Outpatient Medications  Medication Sig Dispense Refill  . Azelastine HCl 0.15 % SOLN     . Calcium Citrate-Vitamin D3 1000-400 LIQD Take by mouth. Reported on 02/04/2016    . clobetasol cream (TEMOVATE) 0.05 % Apply topically 2 (two) times daily. For 2 weeks, then just on week days 60 g 1  . famotidine (PEPCID) 20 MG tablet TAKE 1 TABLET(20 MG) BY MOUTH TWICE DAILY BEFORE A MEAL 180 tablet 1  . ketotifen (ZADITOR) 0.025 % ophthalmic solution 1 drop 2 (two) times daily.    Marland Kitchen loratadine (CLARITIN) 10 MG tablet Take 1 tablet (10 mg total) by mouth daily. 30 tablet 11  . mometasone (ELOCON) 0.1 % lotion Apply topically daily.    Marland Kitchen  amLODipine (NORVASC) 5 MG tablet TAKE 1 TABLET(5 MG) BY MOUTH DAILY 90 tablet 1   No current facility-administered medications for this visit.     Review of Systems:  GENERAL:  Feels "wonderful".  No fevers, sweats or weight loss.  Weight gain of 6 pounds. PERFORMANCE STATUS (ECOG):  0 HEENT:  No visual changes, runny nose, sore throat, mouth sores or tenderness. Lungs: No shortness of breath or cough.  No hemoptysis. Cardiac:  No chest pain, palpitations, orthopnea, or PND. GI:  No nausea, vomiting, diarrhea, constipation, melena or hematochezia. GU:  No urgency, frequency, dysuria, or hematuria. Musculoskeletal:  No back pain.  Left knee pain.  Arthritis.  No muscle tenderness. Extremities:  No pain or swelling. Skin:  No rashes or skin changes. Neuro:  No headache, numbness or weakness, balance or coordination issues. Endocrine:  No diabetes, thyroid issues, hot flashes or night sweats. Psych:  No mood changes, depression or anxiety. Pain:  No focal pain. Review of systems:  All other systems reviewed and found to be negative.   Physical Exam: Blood pressure (!) 148/80, pulse 62, temperature (!) 96.6 F (35.9 C), temperature source Tympanic, resp. rate 18, weight 184 lb (83.5 kg). GENERAL:  Well developed, well nourished, woman sitting comfortably in the exam room in no acute distress. MENTAL STATUS:  Alert and oriented to person, place and time. HEAD: Brown hair pulled back.  Normocephalic, atraumatic, face symmetric, no Cushingoid features. EYES:  Glasses.  Brown eyes.  Pupils equal round and reactive to light and accomodation.  No conjunctivitis or scleral icterus. ENT:  Oropharynx clear without lesion.  Tongue normal. Mucous membranes moist.  RESPIRATORY:  Clear to auscultation without rales, wheezes or rhonchi. CARDIOVASCULAR:  Regular rate and rhythm without murmur, rub or gallop. BREAST:  Right breast with stable inferior scarring.  No masses, skin changes or nipple  discharge.  Left breast with fibrocystic changes superiorly.  No discrete masses, skin changes or nipple discharge.  ABDOMEN:  Soft, non-tender, with active bowel sounds, and no hepatosplenomegaly.  No masses. SKIN:  No rashes, ulcers or lesions. EXTREMITIES: No edema, no skin discoloration or tenderness.  No palpable cords. LYMPH NODES: No palpable cervical, supraclavicular, axillary or inguinal adenopathy  NEUROLOGICAL: Unremarkable. PSYCH:  Appropriate.    Appointment on 07/31/2018  Component Date Value Ref Range Status  . CA 27.29 07/31/2018 39.4* 0.0 - 38.6 U/mL Final   Comment: (NOTE) Siemens Centaur Immunochemiluminometric Methodology [facility]) Values obtained with different assay methods or kits cannot be used interchangeably. Results cannot be interpreted as  absolute evidence of the presence or absence of malignant disease. Performed At: [facility] [place], [place] [id number] Rush Farmer MD UX:[id number] Performed At: [facility] [place], [place] [id number] Nechama Guard MD JS:[id number]   . Sodium 07/31/2018 141  135 - 145 mmol/L Final  . Potassium 07/31/2018 4.2  3.5 - 5.1 mmol/L Final  . Chloride 07/31/2018 107  98 - 111 mmol/L Final  . CO2 07/31/2018 28  22 - 32 mmol/L Final  . Glucose, Bld 07/31/2018 101* 70 - 99 mg/dL Final  . BUN 07/31/2018 15  8 - 23 mg/dL Final  . Creatinine, Ser 07/31/2018 1.13* 0.44 - 1.00 mg/dL Final  . Calcium 07/31/2018 9.6  8.9 - 10.3 mg/dL Final  . Total Protein 07/31/2018 7.4  6.5 - 8.1 g/dL Final  . Albumin 07/31/2018 4.1  3.5 - 5.0 g/dL Final  . AST 07/31/2018 19  15 - 41 U/L Final  . ALT 07/31/2018 18  0 - 44 U/L Final  . Alkaline Phosphatase 07/31/2018 53  38 - 126 U/L Final  . Total Bilirubin 07/31/2018 0.4  0.3 - 1.2 mg/dL Final  . GFR calc non Af Amer 07/31/2018 48* >60 mL/min Final  . GFR calc Af Amer 07/31/2018 55* >60 mL/min Final  . Anion gap 07/31/2018 6  5 - 15 Final    Performed at [facility], [address] [place], [place] [address]  . WBC 07/31/2018 4.0  4.0 - 10.5 K/uL Final  . RBC 07/31/2018 4.47  3.87 - 5.11 MIL/uL Final  . Hemoglobin 07/31/2018 11.9* 12.0 - 15.0 g/dL Final  . HCT 07/31/2018 38.3  36.0 - 46.0 % Final  . MCV 07/31/2018 85.7  80.0 - 100.0 fL Final  . MCH 07/31/2018 26.6  26.0 - 34.0 pg Final  . MCHC 07/31/2018 31.1  30.0 - 36.0 g/dL Final  . RDW 07/31/2018 14.6  11.5 - 15.5 % Final  . Platelets 07/31/2018 195  150 - 400 K/uL Final  . nRBC 07/31/2018 0.0  0.0 - 0.2 % Final  . Neutrophils Relative % 07/31/2018 49  % Final  . Neutro Abs 07/31/2018 2.0  1.7 - 7.7 K/uL Final  . Lymphocytes Relative 07/31/2018 34  % Final  . Lymphs Abs 07/31/2018 1.4  0.7 - 4.0 K/uL Final  . Monocytes Relative 07/31/2018 12  % Final  . Monocytes Absolute 07/31/2018 0.5  0.1 - 1.0 K/uL Final  . Eosinophils Relative 07/31/2018 4  % Final  . Eosinophils Absolute 07/31/2018 0.1  0.0 - 0.5 K/uL Final  . Basophils Relative 07/31/2018 1  % Final  . Basophils Absolute 07/31/2018 0.0  0.0 - 0.1 K/uL Final  . Immature Granulocytes 07/31/2018 0  % Final  . Abs Immature Granulocytes 07/31/2018 0.01  0.00 - 0.07 K/uL Final   Performed at [facility], [address] [place], [place] [address]    Assessment:  Lindsey Foster is a 72 y.o. 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  She received radiation to the right breast from 08/2005 - 09/2005 at the [facility] in [place].  She then received tamoxifen for 5 years (12/2005-11/2010).  Bilateral screening mammogram on 07/25/2017 revealed no evidence of malignancy.    CA27.29 has been followed:  37.9 on 07/04/2014, 43.9 on 07/24/2015, 54.3 on 08/21/2015, 46.2 on 09/22/2015, 38.7 on 03/29/2016, 37.8 on 07/25/2016, 35.9 on 07/27/2017, and 39.4 on 07/31/2018.  She  has progressive osteoporosis.  Bone density on 12/11/2012 revealed a T score of  -2.3 in the left femur.  Bone density on 07/17/2014 revealed a T-score of -3.0 in the left femoral neck and -1.8 in the L1-L4 spine.  Bone density on 07/20/2015 revealed osteoporosis with a T score of -3.1 in the left hip.  Bone density on 09/05/2017 revealed osteoporosis with a T-score of -2.8 in the left femur and -2.8 in the AP spine L1-L4  She previously developed GI symptoms on Fosamax.  She declined Zometa and Prolia.   Symptomatically, she feels "wonderful".  Exam is stable.  Creatinine is 1.13.  CA27.29 is 39.4.  Plan: 1. Labs today:  CBC with diff, CMP. 2. Right breast DCIS  Clinically doing well.  Interval mammogram with no evidence of malignancy.  Unclear significance of history of slightly elevated CA27.29 (typically not followed). 3. Osteoporosis  Review interval bone density.  Discuss calcium 1200 mg and vitamin D 800 IU.  She has declined Zometa and Prolia.  She was intolerant to Fosamax. 4. Schedule screening mammogram 07/29/2019. 5. RTC in 1 year for MD assessment, labs (CBC with diff, CMP, CA27.29), and review of mammogram.  Addendum:  Plan to recheck CA27.29.  Patient will be contacted.   Honor Loh, NP  07/31/2018, 12:21 PM    I saw and evaluated the patient, participating in the key portions of the service and reviewing pertinent diagnostic studies and records.  I reviewed the nurse practitioner's note and agree with the findings and the plan.  The assessment and plan were discussed with the patient.  Several questions were asked by the patient and answered.   Nolon Stalls, MD 07/31/2018,12:21 PM

## 2018-07-31 NOTE — Telephone Encounter (Signed)
Per 07/31/18 los. Schedule mammogram 07/29/2019 & RTC in 1 year for MD assessment, labs (CBC with diff, CMP, CA27.29), and review of mammogram.    Per Melissa from [facility] stated that they can not schedule patient out for 1 year due to there system will ONLY allow them to schedule out 6 months.  So, as of now her mammo is not scheduled.... However, I do have her scheduled to RTC for labs and MD in 1 year.

## 2018-08-01 ENCOUNTER — Other Ambulatory Visit: Payer: Self-pay | Admitting: *Deleted

## 2018-08-01 DIAGNOSIS — D0511 Intraductal carcinoma in situ of right breast: Secondary | ICD-10-CM

## 2018-08-01 LAB — CA 27.29 (SERIAL MONITOR): CA 27.29: 39.4 U/mL — ABNORMAL HIGH (ref 0.0–38.6)

## 2018-08-27 DIAGNOSIS — M1712 Unilateral primary osteoarthritis, left knee: Secondary | ICD-10-CM | POA: Diagnosis not present

## 2018-08-27 DIAGNOSIS — M170 Bilateral primary osteoarthritis of knee: Secondary | ICD-10-CM | POA: Diagnosis not present

## 2018-09-03 ENCOUNTER — Inpatient Hospital Stay: Payer: Medicare Other | Attending: Hematology and Oncology

## 2018-09-03 ENCOUNTER — Encounter (INDEPENDENT_AMBULATORY_CARE_PROVIDER_SITE_OTHER): Payer: Self-pay

## 2018-09-03 DIAGNOSIS — R978 Other abnormal tumor markers: Secondary | ICD-10-CM | POA: Diagnosis not present

## 2018-09-03 DIAGNOSIS — D0511 Intraductal carcinoma in situ of right breast: Secondary | ICD-10-CM

## 2018-09-03 DIAGNOSIS — Z86000 Personal history of in-situ neoplasm of breast: Secondary | ICD-10-CM | POA: Diagnosis not present

## 2018-09-03 DIAGNOSIS — Z170 Estrogen receptor positive status [ER+]: Secondary | ICD-10-CM | POA: Insufficient documentation

## 2018-09-03 DIAGNOSIS — Z923 Personal history of irradiation: Secondary | ICD-10-CM | POA: Insufficient documentation

## 2018-09-04 DIAGNOSIS — M25561 Pain in right knee: Secondary | ICD-10-CM | POA: Diagnosis not present

## 2018-09-04 DIAGNOSIS — M6281 Muscle weakness (generalized): Secondary | ICD-10-CM | POA: Diagnosis not present

## 2018-09-04 DIAGNOSIS — M25562 Pain in left knee: Secondary | ICD-10-CM | POA: Diagnosis not present

## 2018-09-04 LAB — CANCER ANTIGEN 27.29: CA 27.29: 46 U/mL — ABNORMAL HIGH (ref 0.0–38.6)

## 2018-09-07 ENCOUNTER — Other Ambulatory Visit: Payer: Self-pay

## 2018-09-07 ENCOUNTER — Other Ambulatory Visit: Payer: Self-pay | Admitting: Urgent Care

## 2018-09-07 ENCOUNTER — Telehealth: Payer: Self-pay

## 2018-09-07 DIAGNOSIS — R978 Other abnormal tumor markers: Secondary | ICD-10-CM

## 2018-09-07 DIAGNOSIS — D0511 Intraductal carcinoma in situ of right breast: Secondary | ICD-10-CM

## 2018-09-07 NOTE — Telephone Encounter (Signed)
Informed patient lab appt is scheduled for 10/04/2018 at 0900.

## 2018-09-07 NOTE — Telephone Encounter (Signed)
-----   Message from Karen Kitchens, NP sent at 09/07/2018 11:54 AM EST ----- Regarding: FW: Please call patient  ----- Message ----- From: Lequita Asal, MD Sent: 09/07/2018   6:41 AM EST To: Karen Kitchens, NP Subject: Please call patient                             CA27.29 remains elevated.  Inquire about any symptoms.  She was having this lab checked before I saw her. (I check in patients with stage I-IV breast cancer; she has DCIS).  I wonder if there is any inflammation or something else going on.  I would repeat the test in 1 month.  I know it will be hard to get imaging (PET, CT) in [place].  M   ----- Message ----- From: Buel Ream, Lab In [place] Sent: 09/04/2018   6:36 AM EST To: Lequita Asal, MD

## 2018-09-07 NOTE — Telephone Encounter (Signed)
Spoke with patient regarding elevated CA27.29. Pt denies any s/s but reports she is having issues with GFR for several months. States it ranges between 48-58 with last result being 53.78, 07/24/2018. Informed patient we would like to recheck in 1 month. Pt. Prefers either Tuesday or Thursday at 0900 - 1000.

## 2018-09-11 DIAGNOSIS — M6281 Muscle weakness (generalized): Secondary | ICD-10-CM | POA: Diagnosis not present

## 2018-09-11 DIAGNOSIS — M25562 Pain in left knee: Secondary | ICD-10-CM | POA: Diagnosis not present

## 2018-09-11 DIAGNOSIS — M25561 Pain in right knee: Secondary | ICD-10-CM | POA: Diagnosis not present

## 2018-09-12 DIAGNOSIS — I208 Other forms of angina pectoris: Secondary | ICD-10-CM | POA: Diagnosis not present

## 2018-09-12 DIAGNOSIS — I10 Essential (primary) hypertension: Secondary | ICD-10-CM | POA: Diagnosis not present

## 2018-09-12 DIAGNOSIS — R0602 Shortness of breath: Secondary | ICD-10-CM | POA: Diagnosis not present

## 2018-09-12 DIAGNOSIS — R002 Palpitations: Secondary | ICD-10-CM | POA: Diagnosis not present

## 2018-09-13 DIAGNOSIS — M6281 Muscle weakness (generalized): Secondary | ICD-10-CM | POA: Diagnosis not present

## 2018-09-13 DIAGNOSIS — M25561 Pain in right knee: Secondary | ICD-10-CM | POA: Diagnosis not present

## 2018-09-13 DIAGNOSIS — M25562 Pain in left knee: Secondary | ICD-10-CM | POA: Diagnosis not present

## 2018-09-18 DIAGNOSIS — M6281 Muscle weakness (generalized): Secondary | ICD-10-CM | POA: Diagnosis not present

## 2018-09-18 DIAGNOSIS — M25562 Pain in left knee: Secondary | ICD-10-CM | POA: Diagnosis not present

## 2018-09-18 DIAGNOSIS — M25561 Pain in right knee: Secondary | ICD-10-CM | POA: Diagnosis not present

## 2018-09-20 DIAGNOSIS — M25561 Pain in right knee: Secondary | ICD-10-CM | POA: Diagnosis not present

## 2018-09-20 DIAGNOSIS — M25562 Pain in left knee: Secondary | ICD-10-CM | POA: Diagnosis not present

## 2018-09-20 DIAGNOSIS — M6281 Muscle weakness (generalized): Secondary | ICD-10-CM | POA: Diagnosis not present

## 2018-09-24 ENCOUNTER — Telehealth: Payer: Self-pay | Admitting: Radiology

## 2018-09-24 DIAGNOSIS — E785 Hyperlipidemia, unspecified: Secondary | ICD-10-CM

## 2018-09-24 NOTE — Addendum Note (Signed)
Addended by: [name] on: 09/24/2018 01:14 PM   Modules accepted: Orders

## 2018-09-24 NOTE — Telephone Encounter (Signed)
Pt coming in for labs tomorrow, please place future orders. Thank you.  

## 2018-09-25 ENCOUNTER — Other Ambulatory Visit (INDEPENDENT_AMBULATORY_CARE_PROVIDER_SITE_OTHER): Payer: Medicare Other

## 2018-09-25 DIAGNOSIS — M6281 Muscle weakness (generalized): Secondary | ICD-10-CM | POA: Diagnosis not present

## 2018-09-25 DIAGNOSIS — E785 Hyperlipidemia, unspecified: Secondary | ICD-10-CM

## 2018-09-25 DIAGNOSIS — M25562 Pain in left knee: Secondary | ICD-10-CM | POA: Diagnosis not present

## 2018-09-25 DIAGNOSIS — M25561 Pain in right knee: Secondary | ICD-10-CM | POA: Diagnosis not present

## 2018-09-25 LAB — COMPREHENSIVE METABOLIC PANEL
ALK PHOS: 55 U/L (ref 39–117)
ALT: 13 U/L (ref 0–35)
AST: 17 U/L (ref 0–37)
Albumin: 4.3 g/dL (ref 3.5–5.2)
BUN: 16 mg/dL (ref 6–23)
CALCIUM: 9.7 mg/dL (ref 8.4–10.5)
CO2: 30 mEq/L (ref 19–32)
Chloride: 104 mEq/L (ref 96–112)
Creatinine, Ser: 1.24 mg/dL — ABNORMAL HIGH (ref 0.40–1.20)
GFR: 51.04 mL/min — ABNORMAL LOW (ref >60.00)
Glucose, Bld: 84 mg/dL (ref 70–99)
Potassium: 4.3 mEq/L (ref 3.5–5.1)
Sodium: 140 mEq/L (ref 135–145)
TOTAL PROTEIN: 7.4 g/dL (ref 6.0–8.3)
Total Bilirubin: 0.5 mg/dL (ref 0.2–1.2)

## 2018-09-25 LAB — LIPID PANEL
Cholesterol: 218 mg/dL — ABNORMAL HIGH (ref 0–200)
HDL: 66.7 mg/dL (ref >39.00)
LDL Cholesterol: 140 mg/dL — ABNORMAL HIGH (ref 0–99)
NonHDL: 151.3
Total CHOL/HDL Ratio: 3
Triglycerides: 59 mg/dL (ref 0.0–149.0)
VLDL: 11.8 mg/dL (ref 0.0–40.0)

## 2018-09-27 DIAGNOSIS — M6281 Muscle weakness (generalized): Secondary | ICD-10-CM | POA: Diagnosis not present

## 2018-09-27 DIAGNOSIS — M25561 Pain in right knee: Secondary | ICD-10-CM | POA: Diagnosis not present

## 2018-09-27 DIAGNOSIS — M25562 Pain in left knee: Secondary | ICD-10-CM | POA: Diagnosis not present

## 2018-10-04 ENCOUNTER — Other Ambulatory Visit: Payer: Self-pay

## 2018-10-04 ENCOUNTER — Inpatient Hospital Stay: Payer: Medicare Other | Attending: Hematology and Oncology

## 2018-10-04 DIAGNOSIS — R978 Other abnormal tumor markers: Secondary | ICD-10-CM

## 2018-10-04 DIAGNOSIS — D0511 Intraductal carcinoma in situ of right breast: Secondary | ICD-10-CM

## 2018-10-04 DIAGNOSIS — Z86000 Personal history of in-situ neoplasm of breast: Secondary | ICD-10-CM | POA: Insufficient documentation

## 2018-10-05 ENCOUNTER — Other Ambulatory Visit: Payer: Self-pay

## 2018-10-05 ENCOUNTER — Telehealth: Payer: Self-pay

## 2018-10-05 ENCOUNTER — Other Ambulatory Visit: Payer: Self-pay | Admitting: Family Medicine

## 2018-10-05 DIAGNOSIS — D0511 Intraductal carcinoma in situ of right breast: Secondary | ICD-10-CM

## 2018-10-05 DIAGNOSIS — R978 Other abnormal tumor markers: Secondary | ICD-10-CM

## 2018-10-05 LAB — CANCER ANTIGEN 27.29: CA 27.29: 44.7 U/mL — ABNORMAL HIGH (ref 0.0–38.6)

## 2018-10-05 NOTE — Telephone Encounter (Signed)
-----   Message from Karen Kitchens, NP sent at 10/05/2018  2:15 PM EST ----- Tumor marker still elevated, but it has come down a little.   Ask if she is having any symptoms. Mainly concerned about B symptoms. Will plan on rechking CA27.29 in 6 weeks.   Lindsey Foster

## 2018-10-05 NOTE — Telephone Encounter (Signed)
Contacted patient to make aware of CA 27.29 results. Pt denies having any symptoms at this time. Informed patient Shirlean Mylar will reach out to her to have her scheduled to come back in 6 weeks to recheck tumor marker. Pt verbalizes understanding and denies any concerns at this time.

## 2018-10-11 DIAGNOSIS — M25561 Pain in right knee: Secondary | ICD-10-CM | POA: Diagnosis not present

## 2018-10-11 DIAGNOSIS — M25562 Pain in left knee: Secondary | ICD-10-CM | POA: Diagnosis not present

## 2018-10-11 DIAGNOSIS — I10 Essential (primary) hypertension: Secondary | ICD-10-CM | POA: Diagnosis not present

## 2018-10-11 DIAGNOSIS — M6281 Muscle weakness (generalized): Secondary | ICD-10-CM | POA: Diagnosis not present

## 2018-10-11 DIAGNOSIS — E559 Vitamin D deficiency, unspecified: Secondary | ICD-10-CM | POA: Diagnosis not present

## 2018-10-11 DIAGNOSIS — N183 Chronic kidney disease, stage 3 (moderate): Secondary | ICD-10-CM | POA: Diagnosis not present

## 2018-10-18 DIAGNOSIS — M25561 Pain in right knee: Secondary | ICD-10-CM | POA: Diagnosis not present

## 2018-10-18 DIAGNOSIS — N2581 Secondary hyperparathyroidism of renal origin: Secondary | ICD-10-CM | POA: Diagnosis not present

## 2018-10-18 DIAGNOSIS — N183 Chronic kidney disease, stage 3 (moderate): Secondary | ICD-10-CM | POA: Diagnosis not present

## 2018-10-18 DIAGNOSIS — I10 Essential (primary) hypertension: Secondary | ICD-10-CM | POA: Diagnosis not present

## 2018-10-18 DIAGNOSIS — M6281 Muscle weakness (generalized): Secondary | ICD-10-CM | POA: Diagnosis not present

## 2018-10-18 DIAGNOSIS — M25562 Pain in left knee: Secondary | ICD-10-CM | POA: Diagnosis not present

## 2018-10-25 DIAGNOSIS — M25562 Pain in left knee: Secondary | ICD-10-CM | POA: Diagnosis not present

## 2018-10-25 DIAGNOSIS — M6281 Muscle weakness (generalized): Secondary | ICD-10-CM | POA: Diagnosis not present

## 2018-10-25 DIAGNOSIS — M25561 Pain in right knee: Secondary | ICD-10-CM | POA: Diagnosis not present

## 2018-10-30 DIAGNOSIS — M25562 Pain in left knee: Secondary | ICD-10-CM | POA: Diagnosis not present

## 2018-10-30 DIAGNOSIS — M25561 Pain in right knee: Secondary | ICD-10-CM | POA: Diagnosis not present

## 2018-10-30 DIAGNOSIS — M6281 Muscle weakness (generalized): Secondary | ICD-10-CM | POA: Diagnosis not present

## 2018-11-20 ENCOUNTER — Other Ambulatory Visit: Payer: Medicare Other

## 2019-02-05 ENCOUNTER — Ambulatory Visit: Payer: Medicare Other | Admitting: Family Medicine

## 2019-02-06 ENCOUNTER — Telehealth: Payer: Self-pay | Admitting: Family Medicine

## 2019-02-06 DIAGNOSIS — E7800 Pure hypercholesterolemia, unspecified: Secondary | ICD-10-CM

## 2019-02-06 NOTE — Telephone Encounter (Signed)
Pt would like to have labs drawn before her follow up on 02/13/19. Pt mentioned that her cholesterol is being monitored.  Please enter lab orders and call pt to set up a time for her to come in prior to phone visit.  Thanks

## 2019-02-07 NOTE — Telephone Encounter (Signed)
Orders for labs placed.  Please contact the patient to get her scheduled for lab work.  Thanks.

## 2019-02-08 NOTE — Telephone Encounter (Signed)
Called and scheduled with the patient a lab appt.  Humberto Addo,cma

## 2019-02-13 ENCOUNTER — Other Ambulatory Visit: Payer: Medicare Other

## 2019-02-13 ENCOUNTER — Ambulatory Visit (INDEPENDENT_AMBULATORY_CARE_PROVIDER_SITE_OTHER): Payer: Medicare Other

## 2019-02-13 ENCOUNTER — Ambulatory Visit (INDEPENDENT_AMBULATORY_CARE_PROVIDER_SITE_OTHER): Payer: Medicare Other | Admitting: Family Medicine

## 2019-02-13 ENCOUNTER — Other Ambulatory Visit: Payer: Self-pay

## 2019-02-13 DIAGNOSIS — M79674 Pain in right toe(s): Secondary | ICD-10-CM | POA: Diagnosis not present

## 2019-02-13 DIAGNOSIS — Z0000 Encounter for general adult medical examination without abnormal findings: Secondary | ICD-10-CM

## 2019-02-13 DIAGNOSIS — L309 Dermatitis, unspecified: Secondary | ICD-10-CM | POA: Diagnosis not present

## 2019-02-13 DIAGNOSIS — M1712 Unilateral primary osteoarthritis, left knee: Secondary | ICD-10-CM | POA: Diagnosis not present

## 2019-02-13 DIAGNOSIS — M940 Chondrocostal junction syndrome [Tietze]: Secondary | ICD-10-CM | POA: Diagnosis not present

## 2019-02-13 DIAGNOSIS — R42 Dizziness and giddiness: Secondary | ICD-10-CM

## 2019-02-13 DIAGNOSIS — I10 Essential (primary) hypertension: Secondary | ICD-10-CM

## 2019-02-13 MED ORDER — CLOBETASOL PROPIONATE 0.05 % EX CREA: TOPICAL_CREAM | Freq: Two times a day (BID) | CUTANEOUS | 1 refills | Status: DC

## 2019-02-13 MED ORDER — AMLODIPINE BESYLATE 2.5 MG PO TABS: 2.5000 mg | ORAL_TABLET | Freq: Every day | ORAL | 1 refills | Status: DC

## 2019-02-13 NOTE — Progress Notes (Signed)
Subjective:   Lindsey Foster is a 73 y.o. female who presents for Medicare Annual (Subsequent) preventive examination.  Review of Systems:  No ROS.  Medicare Wellness Virtual Visit.  Visual/audio telehealth visit, UTA vital signs.   See social history for additional risk factors.   Cardiac Risk Factors include: advanced age (>71mn, >>62women);hypertension     Objective:     Vitals: There were no vitals taken for this visit.  There is no height or weight on file to calculate BMI.  Advanced Directives 02/13/2019 07/31/2018 02/08/2018 07/27/2017 02/07/2017 07/25/2016 07/24/2015  Does Patient Have a Medical Advance Directive? _0  Yes Yes  Type of Advance Directive Living will;Healthcare Power of Attorney - Living will;Healthcare Power of Attorney Living [place]  Does patient want to make changes to medical advance directive? No - Patient declined - No - Patient declined - No - Patient declined - No - Patient declined  [place] of [place]? No - copy requested - No - copy requested - No - copy requested - No - copy requested    Tobacco Social History   Tobacco Use  Smoking Status Former Smoker  Smokeless Tobacco Never Used     Counseling given: Not Answered   Clinical Intake:  Pre-visit preparation completed: Yes        Diabetes: No  How often do you need to have someone help you when you read instructions, pamphlets, or other written materials from your doctor or pharmacy?: 1 - Never  Interpreter Needed?: No     Past Medical History:  Diagnosis Date  . Allergic rhinitis   . Anxiety    panic attacks in hot rooms  . Arthritis   . Blood in stool   . [place]   radiation - Rt  . Chickenpox   . CKD (chronic kidney disease)   . Colon polyps   . Diverticulosis   . GERD (gastroesophageal reflux disease)   . Heart murmur    . Hyperlipidemia   . Hypertension   . Hypothyroidism   . Multiple thyroid nodules   . Osteopenia   . Personal history of radiation therapy   . Urinary incontinence   . UTI (lower urinary tract infection)   . Vitamin D deficiency disease    Past Surgical History:  Procedure Laterality Date  . BLADDER SURGERY    . BREAST EXCISIONAL BIOPSY Right 2006   positive  . 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 CESAREAN SECTION  1985  . COLONOSCOPY WITH PROPOFOL N/A 09/28/2015   Procedure: COLONOSCOPY WITH PROPOFOL;  Surgeon: MLollie Sails MD;  [facility];  Service: Endoscopy;  Laterality: N/A;  . DILATION AND CURETTAGE OF UTERUS  2012  . Thyroid nodule ablation  2003  . Uterine polyp removal  2009    Family History  Problem Relation Age of Onset  . Heart disease Other        Parent  . Kidney disease Other        Parent  . Uterine cancer Maternal Aunt   . Kidney disease Mother   . Angina Father   . Heart attack Father   . Multiple myeloma Brother   . Breast cancer Neg Hx    Social History   Socioeconomic History  . Marital status: Widowed    Spouse name: Not on file  . Number of  children: Not on file  . Years of education: Not on file  . Highest education level: Not on file  Occupational History  . Not on file  Social Needs  . Financial resource strain: Not hard at all  . Food insecurity    Worry: Never true    Inability: Never true  . Transportation needs    Medical: No    Non-medical: No  Tobacco Use  . Smoking status: Former Research scientist (life sciences)  . Smokeless tobacco: Never Used  Substance and Sexual Activity  . Alcohol use: No    Alcohol/week: 0.0 standard drinks  . Drug use: No  . Sexual activity: Not on file  Lifestyle  . Physical activity    Days per week: 2 days    Minutes per session: 30 min  . Stress: Not at all  Relationships  . Social Herbalist on phone: Not on file    Gets together: Not on file    Attends religious service: Not on  file    Active member of club or organization: Not on file    Attends meetings of clubs or organizations: Not on file    Relationship status: Not on file  Other Topics Concern  . Not on file  Social History Narrative  . Not on file    Outpatient Encounter Medications as of 02/13/2019  Medication Sig  . amLODipine (NORVASC) 5 MG tablet TAKE 1 TABLET(5 MG) BY MOUTH DAILY  . Azelastine HCl 0.15 % SOLN   . Calcium Citrate-Vitamin D3 1000-400 LIQD Take by mouth. Reported on 02/04/2016  . clobetasol cream (TEMOVATE) 0.05 % Apply topically 2 (two) times daily. For 2 weeks, then just on week days  . famotidine (PEPCID) 20 MG tablet TAKE 1 TABLET(20 MG) BY MOUTH TWICE DAILY BEFORE A MEAL  . ketotifen (ZADITOR) 0.025 % ophthalmic solution 1 drop 2 (two) times daily.  Marland Kitchen loratadine (CLARITIN) 10 MG tablet Take 1 tablet (10 mg total) by mouth daily.  . mometasone (ELOCON) 0.1 % lotion Apply topically daily.   No facility-administered encounter medications on file as of 02/13/2019.     Activities of Daily Living In your present state of health, do you have any difficulty performing the following activities: 02/13/2019  Hearing? N  Vision? N  Difficulty concentrating or making decisions? N  Walking or climbing stairs? N  Dressing or bathing? N  Doing errands, shopping? N  Preparing Food and eating ? N  Using the Toilet? N  In the past six months, have you accidently leaked urine? N  Do you have problems with loss of bowel control? N  Managing your Medications? N  Managing your Finances? N  Housekeeping or managing your Housekeeping? N  Some recent data might be hidden    Patient Care Team: [name], MD as PCP - General (Family Medicine)    Assessment:   This is a routine wellness examination for [place].  I connected with patient 02/13/19 at  9:30 AM EDT by a video/audio enabled telemedicine application and verified that I am speaking with the correct person using two identifiers.  Patient stated full name and DOB. Patient gave permission to continue with virtual visit. Patient's location was at home and Nurse's location was at [place] office.   Ted hose worn. Monitors her blood pressure regularly.  Due to intermittent dizziness she plans to discuss amlodipine dose changes with her pcp. Appointment directly following.   Health Screenings  Mammogram - 07/2018 Colonoscopy - 09/2015  Bone Density - 08/2017 Glaucoma -none Hearing -demonstrates normal hearing during visit. Cholesterol - 09/2018 Dental- [facility]- she plans to schedule  Social  Alcohol intake - no        Smoking history- former    Smokers in home? none Illicit drug use? none Physical activity- yard work Diet - regular Sexually Active -not currently BMI- discussed the importance of a healthy diet, water intake and the benefits of aerobic exercise.  Educational material provided.   Safety  Patient feels safe at home- yes Patient does have smoke detectors at home- yes Patient does wear sunscreen or protective clothing when in direct sunlight -yes Patient does wear seat belt when in a moving vehicle -yes Patient drives- yes 36 year old granddaughter lives with her 90% of the time.   Covid-19 precautions and sickness symptoms discussed.   Activities of Daily Living Patient denies needing assistance with: driving, household chores, feeding themselves, getting from bed to chair, getting to the toilet, bathing/showering, dressing, managing money, or preparing meals.  No new identified risk were noted.    Depression Screen Patient denies losing interest in daily life, feeling hopeless, or crying easily over simple problems.   Medication-taking as directed and without issues.   Fall Screen Patient denies being afraid of falling or falling in the last year.   Memory Screen Patient is alert.  Correctly identified the president of the [place], season and recall. Patient likes to read brain stimulation.   Immunizations The following Immunizations were discussed: Influenza, shingles, pneumonia, and tetanus.   Other Providers Patient Care Team: [name], MD as PCP - General (Family Medicine)  Exercise Activities and Dietary recommendations Current Exercise Habits: Home exercise routine, Time (Minutes): 30, Frequency (Times/Week): 2, Weekly Exercise (Minutes/Week): 60, Intensity: Mild  Goals      Patient Stated   . DIET - EAT MORE FRUITS AND VEGETABLES (pt-stated)     Healthy snacking and meals Low salt diet       Fall Risk Fall Risk  02/13/2019 07/24/2018 02/08/2018 01/19/2018 02/07/2017  Falls in the past year? 0 0 No No No  Injury with Fall? - 0 - - -   Depression Screen PHQ 2/9 Scores 02/13/2019 07/24/2018 02/08/2018 12/13/2016  PHQ - 2 Score 1 1 0 0  PHQ- 9 Score - - - 0     Cognitive Function MMSE - Mini Mental State Exam 02/08/2018 02/07/2017  Orientation to time 5 5  Orientation to Place 5 5  Registration 3 3  Attention/ Calculation 5 5  Recall 3 3  Language- name 2 objects 2 2  Language- repeat 1 1  Language- follow 3 step command 3 3  Language- read & follow direction 1 1  Write a sentence 1 1  Copy design 1 1  Total score 30 30     6CIT Screen 02/13/2019  What Year? 0 points  What month? 0 points  What time? 0 points  Count back from 20 0 points  Months in reverse 0 points     There is no immunization history on file for this patient.  Screening Tests Health Maintenance  Topic Date Due  . Hepatitis C Screening  [birth year]  . TETANUS/TDAP  11/17/1962  . PNA vac Low Risk Adult (1 of 2 - PCV13) 11/16/2008  . INFLUENZA VACCINE  03/16/2019  . COLONOSCOPY  09/27/2020  . DEXA SCAN  Completed      Plan:    End of life planning; Advance aging; Advanced directives  discussed.  Copy of current HCPOA/Living Will requested.    I have personally reviewed and noted the following in the patient's chart:   . Medical and social history . Use of alcohol,  tobacco or illicit drugs  . Current medications and supplements . Functional ability and status . Nutritional status . Physical activity . Advanced directives . List of other physicians . Hospitalizations, surgeries, and ER visits in previous 12 months . Vitals . Screenings to include cognitive, depression, and falls . Referrals and appointments  In addition, I have reviewed and discussed with patient certain preventive protocols, quality metrics, and best practice recommendations. A written personalized care plan for preventive services as well as general preventive health recommendations were provided to patient.     Varney Biles, LPN  04/16/6383

## 2019-02-13 NOTE — Patient Instructions (Addendum)
  Lindsey Foster , Thank you for taking time to come for your Medicare Wellness Visit. I appreciate your ongoing commitment to your health goals. Please review the following plan we discussed and let me know if I can assist you in the future.   These are the goals we discussed: Goals      Patient Stated   . DIET - EAT MORE FRUITS AND VEGETABLES (pt-stated)     Healthy snacking and meals Low salt diet       This is a list of the screening recommended for you and due dates:  Health Maintenance  Topic Date Due  .  Hepatitis C: One time screening is recommended by Center for Disease Control  (CDC) for  adults born from [birth year] through 1967.   11-Aug-1946  . Tetanus Vaccine  11/17/1962  . Pneumonia vaccines (1 of 2 - PCV13) 11/16/2008  . Flu Shot  03/16/2019  . Colon Cancer Screening  09/27/2020  . DEXA scan (bone density measurement)  Completed

## 2019-02-13 NOTE — Progress Notes (Signed)
Virtual Visit via video Note  This visit type was conducted due to national recommendations for restrictions regarding the COVID-19 pandemic (e.g. social distancing).  This format is felt to be most appropriate for this patient at this time.  All issues noted in this document were discussed and addressed.  No physical exam was performed (except for noted visual exam findings with Video Visits).   I connected with Lindsey Foster today at 10:00 AM EDT by a video enabled telemedicine application and verified that I am speaking with the correct person using two identifiers. Location patient: home Location provider: work  Persons participating in the virtual visit: patient, provider  I discussed the limitations, risks, security and privacy concerns of performing an evaluation and management service by telephone and the availability of in person appointments. I also discussed with the patient that there may be a patient responsible charge related to this service. The patient expressed understanding and agreed to proceed.   Reason for visit: follow-up  HPI: Hypertension: Taking amlodipine.  No chest pain or shortness of breath.  She does occasionally feel lightheaded when she goes to stand up too fast.  She has had swelling in her bilateral ankles left greater than right and has had to wear support hose.  This has been going on for about 2 months and goes down to normal overnight.  Vertigo: Occurred about 3 weeks ago with blurry vision and room spinning.  Lasted for 3 to 4 hours and then went to sleep and went away.  She was leaning over looking out the window when it occurred.  No tinnitus or hearing changes.  No other symptoms with this.  Left knee pain: Patient notes she has bone-on-bone arthritis that does not want surgery.  She has got cortisone injections and that did help some.  She wants to try over-the-counter Voltaren gel that was concerned about her kidney function.  Right middle toe  swelling: Patient notes this has been swelling intermittently and has been tender to palpation.  No injury.  No pain when walking.  Musculoskeletal chest pain: Patient reports she trimmed the hedges and then developed a little discomfort with soreness right in the middle when she would move.  Only occurs when she makes a certain movement.  Does not occur with exertion.  No diaphoresis.  Has a history of costochondritis.  Eczema: Clobetasol does help with this.  Takes consistent use to improve her rash.   ROS: See pertinent positives and negatives per HPI.  Past Medical History:  Diagnosis Date  . Allergic rhinitis   . Anxiety    panic attacks in hot rooms  . Arthritis   . Blood in stool   . 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 Chickenpox   . CKD (chronic kidney disease)   . Colon polyps   . Diverticulosis   . GERD (gastroesophageal reflux disease)   . Heart murmur   . Hyperlipidemia   . Hypertension   . Hypothyroidism   . Multiple thyroid nodules   . Osteopenia   . Personal history of radiation therapy   . Urinary incontinence   . UTI (lower urinary tract infection)   . Vitamin D deficiency disease     Past Surgical History:  Procedure Laterality Date  . BLADDER SURGERY    . BREAST EXCISIONAL BIOPSY Right 2006   positive  . 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 CESAREAN SECTION  1985  . COLONOSCOPY WITH PROPOFOL  N/A 09/28/2015   Procedure: COLONOSCOPY WITH PROPOFOL;  Surgeon: Lollie Sails, MD;  [facility];  Service: Endoscopy;  Laterality: N/A;  . DILATION AND CURETTAGE OF UTERUS  2012  . Thyroid nodule ablation  2003  . Uterine polyp removal  2009     Family History  Problem Relation Age of Onset  . Heart disease Other        Parent  . Kidney disease Other        Parent  . Uterine cancer Maternal Aunt   . Kidney disease Mother   . Angina Father   . Heart attack Father   . Multiple myeloma Brother   . Breast cancer Neg Hx      SOCIAL HX: Former smoker.   Current Outpatient Medications:  .  amLODipine (NORVASC) 2.5 MG tablet, Take 1 tablet (2.5 mg total) by mouth daily., Disp: 90 tablet, Rfl: 1 .  Azelastine HCl 0.15 % SOLN, , Disp: , Rfl:  .  Calcium Citrate-Vitamin D3 1000-400 LIQD, Take by mouth. Reported on 02/04/2016, Disp: , Rfl:  .  clobetasol cream (TEMOVATE) 0.05 %, Apply topically 2 (two) times daily. For 2 weeks, then just on week days, Disp: 60 g, Rfl: 1 .  famotidine (PEPCID) 20 MG tablet, TAKE 1 TABLET(20 MG) BY MOUTH TWICE DAILY BEFORE A MEAL, Disp: 180 tablet, Rfl: 1 .  ketotifen (ZADITOR) 0.025 % ophthalmic solution, 1 drop 2 (two) times daily., Disp: , Rfl:  .  loratadine (CLARITIN) 10 MG tablet, Take 1 tablet (10 mg total) by mouth daily., Disp: 30 tablet, Rfl: 11 .  mometasone (ELOCON) 0.1 % lotion, Apply topically daily., Disp: , Rfl:   EXAM:  VITALS per patient if applicable: None.  GENERAL: alert, oriented, appears well and in no acute distress  HEENT: atraumatic, conjunttiva clear, no obvious abnormalities on inspection of external nose and ears  NECK: normal movements of the head and neck  LUNGS: on inspection no signs of respiratory distress, breathing rate appears normal, no obvious gross SOB, gasping or wheezing  CV: no obvious cyanosis  MS: moves all visible extremities without noticeable abnormality  PSYCH/NEURO: pleasant and cooperative, no obvious depression or anxiety, speech and thought processing grossly intact  ASSESSMENT AND PLAN:  Discussed the following assessment and plan:  Essential hypertension Decrease amlodipine to 2.5 mg.  She will monitor her swelling and if not improving she will let us know.  She will also monitor the lightheadedness and if that is not improving she will let us know.  Costochondritis I suspect costochondritis as the cause of musculoskeletal chest pain given her description and injury.  She will monitor and if not improving she will let  us know.  Eczema Clobetasol refilled.  Osteoarthritis of left knee I discussed that she could use topical Voltaren as there is very minimal risk of systemic absorption and effect on the kidneys  Vertigo Symptoms seem consistent with a benign cause of vertigo.  Discussed potentially being BPPV.  She will monitor for recurrence.  If it recurs she will let us know.  Toe pain, right Discussed trial of topical Voltaren to see if this would be beneficial.  If not improving she will let us know.    I discussed the assessment and treatment plan with the patient. The patient was provided an opportunity to ask questions and all were answered. The patient agreed with the plan and demonstrated an understanding of the instructions.   The patient was advised to call back  or seek an in-person evaluation if the symptoms worsen or if the condition fails to improve as anticipated.   [name], MD

## 2019-02-17 ENCOUNTER — Encounter: Payer: Self-pay | Admitting: Family Medicine

## 2019-02-17 DIAGNOSIS — M179 Osteoarthritis of knee, unspecified: Secondary | ICD-10-CM | POA: Insufficient documentation

## 2019-02-17 DIAGNOSIS — R42 Dizziness and giddiness: Secondary | ICD-10-CM | POA: Insufficient documentation

## 2019-02-17 DIAGNOSIS — M1712 Unilateral primary osteoarthritis, left knee: Secondary | ICD-10-CM | POA: Insufficient documentation

## 2019-02-17 DIAGNOSIS — M79674 Pain in right toe(s): Secondary | ICD-10-CM | POA: Insufficient documentation

## 2019-02-17 NOTE — Assessment & Plan Note (Addendum)
Decrease amlodipine to 2.5 mg.  She will monitor her swelling and if not improving she will let us know.  She will also monitor the lightheadedness and if that is not improving she will let us know.

## 2019-02-17 NOTE — Assessment & Plan Note (Signed)
I suspect costochondritis as the cause of musculoskeletal chest pain given her description and injury.  She will monitor and if not improving she will let us know.

## 2019-02-17 NOTE — Assessment & Plan Note (Signed)
Symptoms seem consistent with a benign cause of vertigo.  Discussed potentially being BPPV.  She will monitor for recurrence.  If it recurs she will let us know.

## 2019-02-17 NOTE — Assessment & Plan Note (Signed)
I discussed that she could use topical Voltaren as there is very minimal risk of systemic absorption and effect on the kidneys

## 2019-02-17 NOTE — Assessment & Plan Note (Addendum)
Discussed trial of topical Voltaren to see if this would be beneficial.  If not improving she will let us know.

## 2019-02-17 NOTE — Assessment & Plan Note (Signed)
Clobetasol refilled.

## 2019-02-18 DIAGNOSIS — I10 Essential (primary) hypertension: Secondary | ICD-10-CM | POA: Diagnosis not present

## 2019-02-18 DIAGNOSIS — N183 Chronic kidney disease, stage 3 (moderate): Secondary | ICD-10-CM | POA: Diagnosis not present

## 2019-02-18 DIAGNOSIS — E559 Vitamin D deficiency, unspecified: Secondary | ICD-10-CM | POA: Diagnosis not present

## 2019-02-21 DIAGNOSIS — N2581 Secondary hyperparathyroidism of renal origin: Secondary | ICD-10-CM | POA: Diagnosis not present

## 2019-02-21 DIAGNOSIS — N183 Chronic kidney disease, stage 3 (moderate): Secondary | ICD-10-CM | POA: Diagnosis not present

## 2019-02-21 DIAGNOSIS — I10 Essential (primary) hypertension: Secondary | ICD-10-CM | POA: Diagnosis not present

## 2019-03-05 DIAGNOSIS — R0602 Shortness of breath: Secondary | ICD-10-CM | POA: Diagnosis not present

## 2019-03-05 DIAGNOSIS — R0789 Other chest pain: Secondary | ICD-10-CM | POA: Diagnosis not present

## 2019-03-05 DIAGNOSIS — I10 Essential (primary) hypertension: Secondary | ICD-10-CM | POA: Diagnosis not present

## 2019-03-12 ENCOUNTER — Telehealth: Payer: Self-pay

## 2019-03-12 NOTE — Telephone Encounter (Signed)
I called patient to schedule a follow-up of % [facility] care gaps.. It looks like she was seen though on 02/13/19. Just FYI she has been scheduled for f/u 04/29/19.

## 2019-03-27 ENCOUNTER — Other Ambulatory Visit: Payer: Self-pay

## 2019-03-27 ENCOUNTER — Other Ambulatory Visit (INDEPENDENT_AMBULATORY_CARE_PROVIDER_SITE_OTHER): Payer: Medicare Other

## 2019-03-27 DIAGNOSIS — E7800 Pure hypercholesterolemia, unspecified: Secondary | ICD-10-CM | POA: Diagnosis not present

## 2019-03-27 LAB — LDL CHOLESTEROL, DIRECT: Direct LDL: 123 mg/dL

## 2019-03-27 LAB — COMPREHENSIVE METABOLIC PANEL
ALT: 16 U/L (ref 0–35)
AST: 17 U/L (ref 0–37)
Albumin: 4.5 g/dL (ref 3.5–5.2)
Alkaline Phosphatase: 61 U/L (ref 39–117)
BUN: 14 mg/dL (ref 6–23)
CO2: 30 mEq/L (ref 19–32)
Calcium: 10 mg/dL (ref 8.4–10.5)
Chloride: 104 mEq/L (ref 96–112)
Creatinine, Ser: 1.22 mg/dL — ABNORMAL HIGH (ref 0.40–1.20)
GFR: 51.94 mL/min — ABNORMAL LOW (ref >60.00)
Glucose, Bld: 98 mg/dL (ref 70–99)
Potassium: 4.1 mEq/L (ref 3.5–5.1)
Sodium: 141 mEq/L (ref 135–145)
Total Bilirubin: 0.4 mg/dL (ref 0.2–1.2)
Total Protein: 7.1 g/dL (ref 6.0–8.3)

## 2019-04-17 DIAGNOSIS — M79671 Pain in right foot: Secondary | ICD-10-CM | POA: Diagnosis not present

## 2019-04-17 DIAGNOSIS — M19071 Primary osteoarthritis, right ankle and foot: Secondary | ICD-10-CM | POA: Diagnosis not present

## 2019-04-17 DIAGNOSIS — M25572 Pain in left ankle and joints of left foot: Secondary | ICD-10-CM | POA: Diagnosis not present

## 2019-04-29 ENCOUNTER — Ambulatory Visit: Payer: Medicare Other | Admitting: Family Medicine

## 2019-04-29 DIAGNOSIS — H6122 Impacted cerumen, left ear: Secondary | ICD-10-CM | POA: Diagnosis not present

## 2019-04-29 DIAGNOSIS — H60542 Acute eczematoid otitis externa, left ear: Secondary | ICD-10-CM | POA: Diagnosis not present

## 2019-04-29 DIAGNOSIS — R070 Pain in throat: Secondary | ICD-10-CM | POA: Diagnosis not present

## 2019-05-04 ENCOUNTER — Other Ambulatory Visit: Payer: Self-pay | Admitting: Family Medicine

## 2019-06-23 DIAGNOSIS — N183 Chronic kidney disease, stage 3 unspecified: Secondary | ICD-10-CM | POA: Insufficient documentation

## 2019-06-23 DIAGNOSIS — N1831 Chronic kidney disease, stage 3a: Secondary | ICD-10-CM | POA: Insufficient documentation

## 2019-06-23 DIAGNOSIS — E559 Vitamin D deficiency, unspecified: Secondary | ICD-10-CM | POA: Insufficient documentation

## 2019-06-23 DIAGNOSIS — N1832 Chronic kidney disease, stage 3b: Secondary | ICD-10-CM | POA: Insufficient documentation

## 2019-06-24 DIAGNOSIS — N183 Chronic kidney disease, stage 3 unspecified: Secondary | ICD-10-CM | POA: Diagnosis not present

## 2019-06-27 DIAGNOSIS — N1831 Chronic kidney disease, stage 3a: Secondary | ICD-10-CM | POA: Diagnosis not present

## 2019-06-27 DIAGNOSIS — I10 Essential (primary) hypertension: Secondary | ICD-10-CM | POA: Diagnosis not present

## 2019-06-27 DIAGNOSIS — N2581 Secondary hyperparathyroidism of renal origin: Secondary | ICD-10-CM | POA: Diagnosis not present

## 2019-07-05 ENCOUNTER — Other Ambulatory Visit: Payer: Self-pay | Admitting: Hematology and Oncology

## 2019-07-05 DIAGNOSIS — Z1231 Encounter for screening mammogram for malignant neoplasm of breast: Secondary | ICD-10-CM

## 2019-07-17 ENCOUNTER — Ambulatory Visit (INDEPENDENT_AMBULATORY_CARE_PROVIDER_SITE_OTHER): Payer: Medicare Other

## 2019-07-17 ENCOUNTER — Other Ambulatory Visit: Payer: Self-pay

## 2019-07-17 DIAGNOSIS — Z23 Encounter for immunization: Secondary | ICD-10-CM | POA: Diagnosis not present

## 2019-07-18 ENCOUNTER — Telehealth: Payer: Self-pay | Admitting: Family Medicine

## 2019-07-18 NOTE — Telephone Encounter (Signed)
Pt dropped off handicap placard to be completed. Form is in Dr. Ellen Henri color folder up front.

## 2019-07-29 ENCOUNTER — Other Ambulatory Visit: Payer: Self-pay

## 2019-07-29 ENCOUNTER — Ambulatory Visit
Admission: RE | Admit: 2019-07-29 | Discharge: 2019-07-29 | Disposition: A | Discharge status: Home / Self Care | Payer: Medicare Other | Source: Ambulatory Visit | Attending: Hematology and Oncology | Admitting: Hematology and Oncology

## 2019-07-29 DIAGNOSIS — Z1231 Encounter for screening mammogram for malignant neoplasm of breast: Secondary | ICD-10-CM | POA: Diagnosis not present

## 2019-07-30 NOTE — Telephone Encounter (Signed)
Pt called to check on the status of form

## 2019-07-30 NOTE — Telephone Encounter (Signed)
I put a handicap placard for this patient in the sign basket, just wanted to make sure you have it, the patient came by to pick it up, I informed her I would call her when ready.  Khiana Camino,cma

## 2019-07-31 NOTE — Telephone Encounter (Signed)
Please contact the patient and see what she needs this for.  Please see if she can walk more than 200 feet without having to stop and rest.  Please see if she has to walk with a walker or cane.

## 2019-07-31 NOTE — Telephone Encounter (Signed)
I called and spoke with the patient and she states she has bone on bone arthritis and her orthopedists has been giving it to her but he is no longer practicing and she does walk with a cane.  She states she need a knee replacement but she opted out for that right now.  Greysin Medlen,cma

## 2019-08-01 ENCOUNTER — Other Ambulatory Visit: Payer: Self-pay

## 2019-08-01 ENCOUNTER — Inpatient Hospital Stay: Payer: Medicare Other | Attending: Nurse Practitioner

## 2019-08-01 ENCOUNTER — Inpatient Hospital Stay (HOSPITAL_BASED_OUTPATIENT_CLINIC_OR_DEPARTMENT_OTHER): Payer: Medicare Other | Admitting: Nurse Practitioner

## 2019-08-01 VITALS — BP 139/75 | HR 56 | Temp 97.9°F | Resp 16 | Wt 178.7 lb

## 2019-08-01 DIAGNOSIS — D0511 Intraductal carcinoma in situ of right breast: Secondary | ICD-10-CM

## 2019-08-01 DIAGNOSIS — Z86000 Personal history of in-situ neoplasm of breast: Secondary | ICD-10-CM | POA: Insufficient documentation

## 2019-08-01 DIAGNOSIS — Z923 Personal history of irradiation: Secondary | ICD-10-CM | POA: Diagnosis not present

## 2019-08-01 DIAGNOSIS — Z807 Family history of other malignant neoplasms of lymphoid, hematopoietic and related tissues: Secondary | ICD-10-CM | POA: Insufficient documentation

## 2019-08-01 DIAGNOSIS — Z87891 Personal history of nicotine dependence: Secondary | ICD-10-CM | POA: Diagnosis not present

## 2019-08-01 DIAGNOSIS — M810 Age-related osteoporosis without current pathological fracture: Secondary | ICD-10-CM | POA: Diagnosis not present

## 2019-08-01 DIAGNOSIS — R978 Other abnormal tumor markers: Secondary | ICD-10-CM | POA: Diagnosis not present

## 2019-08-01 DIAGNOSIS — M1710 Unilateral primary osteoarthritis, unspecified knee: Secondary | ICD-10-CM | POA: Diagnosis not present

## 2019-08-01 LAB — CBC WITH DIFFERENTIAL/PLATELET
Abs Immature Granulocytes: 0.01 10*3/uL (ref 0.00–0.07)
Basophils Absolute: 0 10*3/uL (ref 0.0–0.1)
Basophils Relative: 1 %
Eosinophils Absolute: 0.1 10*3/uL (ref 0.0–0.5)
Eosinophils Relative: 2 %
HCT: 40.4 % (ref 36.0–46.0)
Hemoglobin: 13 g/dL (ref 12.0–15.0)
Immature Granulocytes: 0 %
Lymphocytes Relative: 40 %
Lymphs Abs: 1.3 10*3/uL (ref 0.7–4.0)
MCH: 27.3 pg (ref 26.0–34.0)
MCHC: 32.2 g/dL (ref 30.0–36.0)
MCV: 84.9 fL (ref 80.0–100.0)
Monocytes Absolute: 0.5 10*3/uL (ref 0.1–1.0)
Monocytes Relative: 14 %
Neutro Abs: 1.4 10*3/uL — ABNORMAL LOW (ref 1.7–7.7)
Neutrophils Relative %: 43 %
Platelets: 199 10*3/uL (ref 150–400)
RBC: 4.76 MIL/uL (ref 3.87–5.11)
RDW: 14.9 % (ref 11.5–15.5)
WBC: 3.3 10*3/uL — ABNORMAL LOW (ref 4.0–10.5)
nRBC: 0 % (ref 0.0–0.2)

## 2019-08-01 LAB — COMPREHENSIVE METABOLIC PANEL
ALT: 19 U/L (ref 0–44)
AST: 22 U/L (ref 15–41)
Albumin: 4.2 g/dL (ref 3.5–5.0)
Alkaline Phosphatase: 59 U/L (ref 38–126)
Anion gap: 6 (ref 5–15)
BUN: 15 mg/dL (ref 8–23)
CO2: 29 mmol/L (ref 22–32)
Calcium: 9.5 mg/dL (ref 8.9–10.3)
Chloride: 103 mmol/L (ref 98–111)
Creatinine, Ser: 1.21 mg/dL — ABNORMAL HIGH (ref 0.44–1.00)
GFR calc Af Amer: 51 mL/min — ABNORMAL LOW (ref >60)
GFR calc non Af Amer: 44 mL/min — ABNORMAL LOW (ref >60)
Glucose, Bld: 95 mg/dL (ref 70–99)
Potassium: 4.1 mmol/L (ref 3.5–5.1)
Sodium: 138 mmol/L (ref 135–145)
Total Bilirubin: 0.4 mg/dL (ref 0.3–1.2)
Total Protein: 7.8 g/dL (ref 6.5–8.1)

## 2019-08-01 LAB — VITAMIN D 25 HYDROXY (VIT D DEFICIENCY, FRACTURES): Vit D, 25-Hydroxy: 48.22 ng/mL (ref 30–100)

## 2019-08-01 NOTE — Progress Notes (Signed)
Patient here for follow up. Denies any concerns.  

## 2019-08-01 NOTE — Progress Notes (Signed)
[facility] day:  07/31/2018   Chief Complaint: Lindsey Foster is a 73 y.o. female with history of DCIS in the right breast, who returns to clinic for annual follow-up/continued surveillance.  HPI: Patient was last seen by Dr. Mike Gip on 07/31/2018.  Her bilateral screening mammogram from 2019 was negative for malignancy.  At that time, exam is unremarkable and her labs are stable. Last bone density screening was on 09/05/2017 consistent with osteoporosis with T score of -2.8 in the left femur and -2.8 in the AP spine 1-04. at that time, patient felt wonderful.  She continued calcium and vitamin D.  She has declined Zometa and Prolia was previously intolerant to Fosamax.  CA 27-29 has been intermittently elevated.  Today, she denies any new aches or pains.  Perform self breast exams and denies any new lumps or bumps.  She denies any B symptoms, no interval infections.  Appetite is stable and she exercises though variety of activities has been somewhat limited due to Covid.  She continues to have chronic knee pain secondary to arthritis s/p cortisone injections which is stable, intermittent, and unchanged.  Past medical, surgical, social, and family history were reviewed and updated as below.  Current medications and allergies were reviewed and updated as below.   Past Medical History:  Diagnosis Date  . Allergic rhinitis   . Anxiety    panic attacks in hot rooms  . Arthritis   . Blood in stool   . Breast cancer ([place]) 2006   radiation - Rt  . Chickenpox   . CKD (chronic kidney disease)   . Colon polyps   . Diverticulosis   . GERD (gastroesophageal reflux disease)   . Heart murmur   . Hyperlipidemia   . Hypertension   . Hypothyroidism   . Multiple thyroid nodules   . Osteopenia   . Personal history of radiation therapy   . Urinary incontinence   . UTI (lower urinary tract infection)   . Vitamin D deficiency disease     Past  Surgical History:  Procedure Laterality Date  . BLADDER SURGERY    . BREAST EXCISIONAL BIOPSY Right 2006   positive  . 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 CESAREAN SECTION  1985  . COLONOSCOPY WITH PROPOFOL N/A 09/28/2015   Procedure: COLONOSCOPY WITH PROPOFOL;  Surgeon: Lollie Sails, MD;  Location: [facility];  Service: Endoscopy;  Laterality: N/A;  . DILATION AND CURETTAGE OF UTERUS  2012  . Thyroid nodule ablation  2003  . Uterine polyp removal  2009     Family History  Problem Relation Age of Onset  . Heart disease Other        Parent  . Kidney disease Other        Parent  . Uterine cancer Maternal Aunt   . Kidney disease Mother   . Angina Father   . Heart attack Father   . Multiple myeloma Brother   . Breast cancer Neg Hx     Social History:  reports that she has quit smoking. She has never used smokeless tobacco. She reports that she does not drink alcohol or use drugs.  She is originally from [place].  She is working on her "bucket list".  She has gone hot air ballooning in the [facility].   She plans to jump out of a plane (parachute).   Allergies:  Allergies  Allergen Reactions  . Latex Rash  Current Medications: Current Outpatient Medications  Medication Sig Dispense Refill  . amLODipine (NORVASC) 2.5 MG tablet Take 1 tablet (2.5 mg total) by mouth daily. 90 tablet 1  . Calcium Citrate-Vitamin D3 1000-400 LIQD Take 1 tablet by mouth daily. Reported on 02/04/2016    . clobetasol cream (TEMOVATE) 0.05 % Apply topically 2 (two) times daily. For 2 weeks, then just on week days 60 g 1  . famotidine (PEPCID) 20 MG tablet TAKE 1 TABLET(20 MG) BY MOUTH TWICE DAILY BEFORE A MEAL 180 tablet 1  . fluticasone (FLONASE SENSIMIST) 27.5 MCG/SPRAY nasal spray Place 1 spray into the nose daily as needed.     . Garlic 583 MG TABS Take 1 tablet by mouth daily.     Marland Kitchen ketotifen (ZADITOR) 0.025 % ophthalmic solution 1 drop 2 (two) times daily.    Marland Kitchen loratadine (CLARITIN)  10 MG tablet Take 1 tablet (10 mg total) by mouth daily. 30 tablet 11  . mometasone (ELOCON) 0.1 % lotion Apply topically daily.     No current facility-administered medications for this visit.   Review of Systems  Constitutional: Negative.  Negative for chills, diaphoresis, fever, malaise/fatigue and weight loss.  HENT: Negative.  Negative for congestion, sore throat and tinnitus.   Eyes: Negative.  Negative for double vision, discharge and redness.  Respiratory: Negative.  Negative for cough, sputum production and shortness of breath.   Cardiovascular: Negative.  Negative for chest pain, palpitations, orthopnea, claudication, leg swelling and PND.  Gastrointestinal: Negative.  Negative for abdominal pain, constipation, diarrhea, melena and nausea.  Genitourinary: Negative.  Negative for dysuria, frequency and hematuria.  Musculoskeletal: Positive for joint pain (knee- chronic- per hpi). Negative for myalgias.  Skin: Negative.  Negative for itching and rash.  Neurological: Negative.  Negative for dizziness, sensory change, weakness and headaches.  Endo/Heme/Allergies: Negative.  Negative for environmental allergies and polydipsia. Does not bruise/bleed easily.  Psychiatric/Behavioral: Negative.  Negative for depression. The patient is not nervous/anxious and does not have insomnia.      Physical Exam: Blood pressure 139/75, pulse (!) 56, temperature 97.9 F (36.6 C), temperature source Oral, resp. rate 16, weight 178 lb 10.9 oz (81.1 kg), SpO2 100 %.  Physical Exam  Constitutional: She is oriented to person, place, and time. She appears well-developed and well-nourished.  Sitting comfortably in exam room.  No acute distress.  Unaccompanied.  Wearing mask.  HENT:  Head: Normocephalic and atraumatic.  Brown hair pulled back.  No cushingoid features  Eyes: Conjunctivae are normal. No scleral icterus.  Neck: No JVD present.  Cardiovascular: Normal rate and regular rhythm.   Pulmonary/Chest: Effort normal and breath sounds normal. No respiratory distress.  Right breast-stable inferior scarring.  No masses, skin changes, or nipple discharge Left breast-fibrocystic changes superiorly.  No discrete masses, skin changes, or nipple discharge  Abdominal: Soft. She exhibits no distension. There is no abdominal tenderness.  Musculoskeletal:        General: No deformity or edema.     Cervical back: Neck supple.  Lymphadenopathy:    She has no cervical adenopathy.  Neurological: She is alert and oriented to person, place, and time.  Skin: Skin is warm and dry. No rash noted. No pallor.  Psychiatric: She has a normal mood and affect. Her behavior is normal.   Appointment on 08/01/2019  Component Date Value Ref Range Status  . Sodium 08/01/2019 138  135 - 145 mmol/L Final  . Potassium 08/01/2019 4.1  3.5 - 5.1 mmol/L Final  .  Chloride 08/01/2019 103  98 - 111 mmol/L Final  . CO2 08/01/2019 29  22 - 32 mmol/L Final  . Glucose, Bld 08/01/2019 95  70 - 99 mg/dL Final  . BUN 08/01/2019 15  8 - 23 mg/dL Final  . Creatinine, Ser 08/01/2019 1.21* 0.44 - 1.00 mg/dL Final  . Calcium 08/01/2019 9.5  8.9 - 10.3 mg/dL Final  . Total Protein 08/01/2019 7.8  6.5 - 8.1 g/dL Final  . Albumin 08/01/2019 4.2  3.5 - 5.0 g/dL Final  . AST 08/01/2019 22  15 - 41 U/L Final  . ALT 08/01/2019 19  0 - 44 U/L Final  . Alkaline Phosphatase 08/01/2019 59  38 - 126 U/L Final  . Total Bilirubin 08/01/2019 0.4  0.3 - 1.2 mg/dL Final  . GFR calc non Af Amer 08/01/2019 44* >60 mL/min Final  . GFR calc Af Amer 08/01/2019 51* >60 mL/min Final  . Anion gap 08/01/2019 6  5 - 15 Final   Performed at [facility], [address], [place], [place] [address]  . WBC 08/01/2019 3.3* 4.0 - 10.5 K/uL Final  . RBC 08/01/2019 4.76  3.87 - 5.11 MIL/uL Final  . Hemoglobin 08/01/2019 13.0  12.0 - 15.0 g/dL Final  . HCT 08/01/2019 40.4  36.0 - 46.0 % Final  . MCV 08/01/2019 84.9  80.0 - 100.0 fL  Final  . MCH 08/01/2019 27.3  26.0 - 34.0 pg Final  . MCHC 08/01/2019 32.2  30.0 - 36.0 g/dL Final  . RDW 08/01/2019 14.9  11.5 - 15.5 % Final  . Platelets 08/01/2019 199  150 - 400 K/uL Final  . nRBC 08/01/2019 0.0  0.0 - 0.2 % Final  . Neutrophils Relative % 08/01/2019 43  % Final  . Neutro Abs 08/01/2019 1.4* 1.7 - 7.7 K/uL Final  . Lymphocytes Relative 08/01/2019 40  % Final  . Lymphs Abs 08/01/2019 1.3  0.7 - 4.0 K/uL Final  . Monocytes Relative 08/01/2019 14  % Final  . Monocytes Absolute 08/01/2019 0.5  0.1 - 1.0 K/uL Final  . Eosinophils Relative 08/01/2019 2  % Final  . Eosinophils Absolute 08/01/2019 0.1  0.0 - 0.5 K/uL Final  . Basophils Relative 08/01/2019 1  % Final  . Basophils Absolute 08/01/2019 0.0  0.0 - 0.1 K/uL Final  . Immature Granulocytes 08/01/2019 0  % Final  . Abs Immature Granulocytes 08/01/2019 0.01  0.00 - 0.07 K/uL Final   Performed at [facility], [address] [place], [place] [address]    Assessment:  Deaisa Merida is a 73 y.o. 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  She received radiation to the right breast from 08/2005 - 09/2005 at the [facility] in [place].  She then received tamoxifen for 5 years (12/2005-11/2010).  Bilateral screening mammogram on 07/29/2019 was reported as BI-RADS Category 1: Negative with no mammographic evidence of malignancy.  CA27.29 has been followed:  37.9 on 07/04/2014, 43.9 on 07/24/2015, 54.3 on 08/21/2015, 46.2 on 09/22/2015, 38.7 on 03/29/2016, 37.8 on 07/25/2016, 35.9 on 07/27/2017, 39.4 on 07/31/2018, 46 on 09/03/2018, 44.7 on 10/04/2018, and 42.5 on 08/01/2019.  She has progressive osteoporosis.  Bone density on 12/11/2012 revealed a T score of -2.3 in the left femur.  Bone density on 07/17/2014 revealed a T-score of -3.0 in the left femoral neck and -1.8 in the L1-L4 spine.  Bone density on 07/20/2015 revealed osteoporosis with a T  score  of -3.1 in the left hip.  Bone density on 09/05/2017 revealed osteoporosis with a T-score of -2.8 in the left femur and -2.8 in the AP spine L1-L4.   She previously developed GI symptoms on Fosamax and medication was discontinued.  She declined Zometa and Prolia.   Symptomatically, patient feels well and clinically no evidence of recurrent or progressive disease.  Mammogram revealed no radiologic evidence of malignancy.  CA 27-29 is elevated though decreased from previous at 42.5.  Plan: 1. Labs today: CBC with differential, CMP, CA 27-29, and vitamin D 2. Right breast DCIS  Clinically stable without evidence of progressive or recurrent malignancy.  Mammogram reported as BI-RADS Category 1 with no radiographic evidence of  progressive or recurrent malignancy  Unclear significance of history of elevated CA 27-29.  Again discussed that tumor markers  are not traditionally followed for surveillance.  Though CA 27-29 is again elevated today it is  decreased from previous.   Recommend repeating mammogram in 1 year for continued surveillance 3. Osteoporosis  Progressive osteoporosis likely multifactorial etiology.  Unable to tolerate Fosamax and declined Zometa on Prolia.  Currently on calcium and vitamin D  Plan to repeat bone density scan in January 2021  Continue calcium 1200 mg and vitamin D 800 IU  Vitamin D level checked today was normal at 48.22  Lengthy discussion regarding importance of weightbearing exercise and how to perform  exercise during Covid precautions Disposition: Plan to repeat mammogram in December 2021 Bone density scan in January 2021 Return to clinic in 1 year for labs (CBC with differential, CMP, CA 27-29), and follow-up with Dr. Mike Gip  I discussed the assessment and treatment plan with the patient.  The patient was provided an opportunity to ask questions and all were answered.  The patient agreed with the plan and demonstrated an understanding of the instructions.   The patient was advised to call back if the symptoms worsen or if the condition fails to improve as anticipated.  Beckey Rutter, DNP, AGNP-C [place] [facility] [phone] (clinic)  CC: Dr. Mike Gip

## 2019-08-01 NOTE — Telephone Encounter (Signed)
This has been completed. Please let the patient know this will be available for pick up tomorrow.

## 2019-08-01 NOTE — Telephone Encounter (Signed)
I called and informed the patient that she could pick up the handicap form tomorrow, she understood.  Sabian Kuba,cma

## 2019-08-02 ENCOUNTER — Telehealth: Payer: Self-pay

## 2019-08-02 LAB — CANCER ANTIGEN 27.29: CA 27.29: 42.5 U/mL — ABNORMAL HIGH (ref 0.0–38.6)

## 2019-08-02 NOTE — Telephone Encounter (Signed)
Patient came in today and picked up her Handicap placard.  Jebidiah Baggerly,cma

## 2019-08-12 ENCOUNTER — Encounter: Payer: Self-pay | Admitting: Nurse Practitioner

## 2019-08-21 ENCOUNTER — Ambulatory Visit: Payer: Medicare Other | Admitting: Family Medicine

## 2019-08-22 ENCOUNTER — Other Ambulatory Visit: Payer: Self-pay | Admitting: Family Medicine

## 2019-09-02 DIAGNOSIS — R002 Palpitations: Secondary | ICD-10-CM | POA: Diagnosis not present

## 2019-09-02 DIAGNOSIS — R0789 Other chest pain: Secondary | ICD-10-CM | POA: Diagnosis not present

## 2019-09-02 DIAGNOSIS — I10 Essential (primary) hypertension: Secondary | ICD-10-CM | POA: Diagnosis not present

## 2019-09-02 DIAGNOSIS — R0602 Shortness of breath: Secondary | ICD-10-CM | POA: Diagnosis not present

## 2019-09-05 ENCOUNTER — Ambulatory Visit
Admission: RE | Admit: 2019-09-05 | Discharge: 2019-09-05 | Disposition: A | Discharge status: Home / Self Care | Payer: Medicare Other | Source: Ambulatory Visit | Attending: Nurse Practitioner | Admitting: Nurse Practitioner

## 2019-09-05 DIAGNOSIS — D0511 Intraductal carcinoma in situ of right breast: Secondary | ICD-10-CM

## 2019-09-05 DIAGNOSIS — M810 Age-related osteoporosis without current pathological fracture: Secondary | ICD-10-CM | POA: Diagnosis not present

## 2019-09-05 DIAGNOSIS — Z780 Asymptomatic menopausal state: Secondary | ICD-10-CM | POA: Diagnosis not present

## 2019-10-14 ENCOUNTER — Ambulatory Visit: Payer: Medicare Other | Admitting: Family Medicine

## 2019-10-24 DIAGNOSIS — N1831 Chronic kidney disease, stage 3a: Secondary | ICD-10-CM | POA: Diagnosis not present

## 2019-10-31 DIAGNOSIS — I10 Essential (primary) hypertension: Secondary | ICD-10-CM | POA: Diagnosis not present

## 2019-10-31 DIAGNOSIS — N2581 Secondary hyperparathyroidism of renal origin: Secondary | ICD-10-CM | POA: Diagnosis not present

## 2019-10-31 DIAGNOSIS — N1831 Chronic kidney disease, stage 3a: Secondary | ICD-10-CM | POA: Diagnosis not present

## 2019-11-06 ENCOUNTER — Ambulatory Visit (INDEPENDENT_AMBULATORY_CARE_PROVIDER_SITE_OTHER): Payer: Medicare Other | Admitting: Family Medicine

## 2019-11-06 ENCOUNTER — Other Ambulatory Visit: Payer: Self-pay

## 2019-11-06 ENCOUNTER — Encounter: Payer: Self-pay | Admitting: Family Medicine

## 2019-11-06 VITALS — BP 137/80 | HR 96 | Temp 96.0°F | Ht 67.0 in | Wt 185.4 lb

## 2019-11-06 DIAGNOSIS — D0511 Intraductal carcinoma in situ of right breast: Secondary | ICD-10-CM

## 2019-11-06 DIAGNOSIS — R600 Localized edema: Secondary | ICD-10-CM | POA: Diagnosis not present

## 2019-11-06 DIAGNOSIS — M79672 Pain in left foot: Secondary | ICD-10-CM | POA: Insufficient documentation

## 2019-11-06 DIAGNOSIS — M810 Age-related osteoporosis without current pathological fracture: Secondary | ICD-10-CM | POA: Diagnosis not present

## 2019-11-06 DIAGNOSIS — I10 Essential (primary) hypertension: Secondary | ICD-10-CM

## 2019-11-06 DIAGNOSIS — R232 Flushing: Secondary | ICD-10-CM | POA: Diagnosis not present

## 2019-11-06 DIAGNOSIS — E785 Hyperlipidemia, unspecified: Secondary | ICD-10-CM | POA: Diagnosis not present

## 2019-11-06 LAB — LIPID PANEL
Cholesterol: 205 mg/dL — ABNORMAL HIGH (ref 0–200)
HDL: 72.2 mg/dL (ref >39.00)
LDL Cholesterol: 125 mg/dL — ABNORMAL HIGH (ref 0–99)
NonHDL: 133.03
Total CHOL/HDL Ratio: 3
Triglycerides: 40 mg/dL (ref 0.0–149.0)
VLDL: 8 mg/dL (ref 0.0–40.0)

## 2019-11-06 LAB — CBC
HCT: 37.5 % (ref 36.0–46.0)
Hemoglobin: 12.2 g/dL (ref 12.0–15.0)
MCHC: 32.4 g/dL (ref 30.0–36.0)
MCV: 84.3 fl (ref 78.0–100.0)
Platelets: 176 10*3/uL (ref 150.0–400.0)
RBC: 4.45 Mil/uL (ref 3.87–5.11)
RDW: 14.8 % (ref 11.5–15.5)
WBC: 4.3 10*3/uL (ref 4.0–10.5)

## 2019-11-06 LAB — COMPREHENSIVE METABOLIC PANEL
ALT: 17 U/L (ref 0–35)
AST: 27 U/L (ref 0–37)
Albumin: 4.2 g/dL (ref 3.5–5.2)
Alkaline Phosphatase: 59 U/L (ref 39–117)
BUN: 17 mg/dL (ref 6–23)
CO2: 29 mEq/L (ref 19–32)
Calcium: 9.6 mg/dL (ref 8.4–10.5)
Chloride: 102 mEq/L (ref 96–112)
Creatinine, Ser: 1.24 mg/dL — ABNORMAL HIGH (ref 0.40–1.20)
GFR: 50.89 mL/min — ABNORMAL LOW (ref >60.00)
Glucose, Bld: 94 mg/dL (ref 70–99)
Potassium: 3.7 mEq/L (ref 3.5–5.1)
Sodium: 136 mEq/L (ref 135–145)
Total Bilirubin: 0.5 mg/dL (ref 0.2–1.2)
Total Protein: 7.4 g/dL (ref 6.0–8.3)

## 2019-11-06 LAB — TSH: TSH: 2.2 u[IU]/mL (ref 0.35–4.50)

## 2019-11-06 LAB — VITAMIN D 25 HYDROXY (VIT D DEFICIENCY, FRACTURES): VITD: 42.41 ng/mL (ref 30.00–100.00)

## 2019-11-06 MED ORDER — TETANUS-DIPHTHERIA TOXOIDS TD 5-2 LFU IM INJ: 0.5000 mL | INJECTION | Freq: Once | INTRAMUSCULAR | 0 refills | Status: AC

## 2019-11-06 NOTE — Assessment & Plan Note (Signed)
Check vitamin D. 

## 2019-11-06 NOTE — Assessment & Plan Note (Signed)
Refer to sports medicine.  

## 2019-11-06 NOTE — Assessment & Plan Note (Signed)
Check labs.  Consider changing amlodipine.

## 2019-11-06 NOTE — Assessment & Plan Note (Signed)
Adequate control.  Continue current regimen.  We may have to consider changing amlodipine to an alternative medication given swelling.

## 2019-11-06 NOTE — Patient Instructions (Signed)
Nice to see you. We will get you referred to sports medicine for your feet. We will check some lab work today and contact you with the results. Please get your tetanus vaccine at the pharmacy. Please follow-up with your oncologist regarding your lab results.

## 2019-11-06 NOTE — Progress Notes (Signed)
Lindsey Rumps, MD Phone: [phone]  Lindsey Foster is a 74 y.o. female who presents today for f/u.  HYPERTENSION  Disease Monitoring  Home BP Monitoring 130s-70-low 80s. Chest pain-no    dyspnea-no. Medications  Compliance-good taking amlodipine.  Edema-yes.  Left greater than right slightly.  Better in the morning.  Has been wearing support hose.  No PND or orthopnea.  Hot flashes: Patient notes occasional hot flashes at night though does have them during the day.  The skin around her neck will be slightly moist.  No drenching night sweats.  She notes consistently having had some hot flashes since menopause.  Bilateral foot pain: Left greater than right.  She notes if she twists side to side the dorsum of her left foot hurts.  If she puts pressure on both feet it hurts in the dorsum.  Typically improves after about 30 minutes.  She did try some Tylenol and icing with little benefit.  She saw podiatry and had an x-ray and notes there were no abnormalities.  History of breast cancer: She is following with oncology.  Her CA 27-29 has been mildly elevated.  It has been trending down.  She wonders if there is anything to be done regarding this.  She has not contacted her oncologist regarding that.  She reports having had a hepatitis C test previously.  She is due for tetanus vaccine and she will consider getting this later in the year.  She declines pneumonia vaccine at this time.   Social History   Tobacco Use  Smoking Status Former Smoker  Smokeless Tobacco Never Used     ROS see history of present illness  Objective  Physical Exam Vitals:   11/06/19 0913  BP: 137/80  Pulse: 96  Temp: (!) 96 F (35.6 C)    BP Readings from Last 3 Encounters:  11/06/19 137/80  08/01/19 139/75  07/31/18 (!) 148/80   Wt Readings from Last 3 Encounters:  11/06/19 185 lb 6.4 oz (84.1 kg)  08/01/19 178 lb 10.9 oz (81.1 kg)  07/31/18 184 lb (83.5 kg)    Physical  Exam Constitutional:      General: She is not in acute distress.    Appearance: She is not diaphoretic.  Cardiovascular:     Rate and Rhythm: Normal rate and regular rhythm.     Heart sounds: Normal heart sounds.  Pulmonary:     Effort: Pulmonary effort is normal.     Breath sounds: Normal breath sounds.  Musculoskeletal:     Comments: Left foot and ankle with no tenderness over the medial or lateral malleolus, navicular, or fifth meta metatarsal, there is slight discomfort over the dorsal aspect of the proximal foot, 2+ PT and DP pulses bilaterally, 1+ pitting edema bilaterally to the midshin  Skin:    General: Skin is warm and dry.  Neurological:     Mental Status: She is alert.      Assessment/Plan: Please see individual problem list.  Essential hypertension Adequate control.  Continue current regimen.  We may have to consider changing amlodipine to an alternative medication given swelling.  Bilateral leg edema Check labs.  Consider changing amlodipine.  DCIS (ductal carcinoma in situ) She will continue to see oncology.  I encouraged her to contact them to ask about the CA 27-29.  Left foot pain Refer to sports medicine.  Osteoporosis Check vitamin D  Hot flashes Likely postmenopausal hot flashes.  She will monitor.  We will check a TSH.  Advised  if she develops any drenching sweats she needs to let us know.   Health maintenance: Patient will get a tetanus vaccine later this year.  Orders Placed This Encounter  Procedures  . Comp Met (CMET)  . Lipid panel  . Vitamin D (25 hydroxy)  . TSH  . CBC  . Ambulatory referral to Sports Medicine    Referral Priority:   Routine    Referral Type:   Consultation    Number of Visits Requested:   1    Meds ordered this encounter  Medications  . tetanus & diphtheria toxoids, adult, (TENIVAC) 5-2 LFU injection    Sig: Inject 0.5 mLs into the muscle once for 1 dose.    Dispense:  0.5 mL    Refill:  0    This visit  occurred during the SARS-CoV-2 public health emergency.  Safety protocols were in place, including screening questions prior to the visit, additional usage of staff PPE, and extensive cleaning of exam room while observing appropriate contact time as indicated for disinfecting solutions.   I have spent 43 minutes in the care of this patient regarding the above listed medical problems, discussion of those medical issues, performing an exam, placing orders, and documentation.   Lindsey Rumps, MD [name]

## 2019-11-06 NOTE — Assessment & Plan Note (Signed)
She will continue to see oncology.  I encouraged her to contact them to ask about the CA 27-29.

## 2019-11-06 NOTE — Assessment & Plan Note (Signed)
Likely postmenopausal hot flashes.  She will monitor.  We will check a TSH.  Advised if she develops any drenching sweats she needs to let us know.

## 2019-11-13 ENCOUNTER — Other Ambulatory Visit: Payer: Self-pay

## 2019-11-13 ENCOUNTER — Ambulatory Visit (INDEPENDENT_AMBULATORY_CARE_PROVIDER_SITE_OTHER): Payer: Medicare Other | Admitting: Family Medicine

## 2019-11-13 ENCOUNTER — Encounter: Payer: Self-pay | Admitting: Family Medicine

## 2019-11-13 ENCOUNTER — Ambulatory Visit (INDEPENDENT_AMBULATORY_CARE_PROVIDER_SITE_OTHER): Payer: Medicare Other

## 2019-11-13 VITALS — BP 134/82 | HR 71 | Ht 67.0 in | Wt 174.0 lb

## 2019-11-13 DIAGNOSIS — M25572 Pain in left ankle and joints of left foot: Secondary | ICD-10-CM

## 2019-11-13 DIAGNOSIS — M19072 Primary osteoarthritis, left ankle and foot: Secondary | ICD-10-CM | POA: Insufficient documentation

## 2019-11-13 DIAGNOSIS — G8929 Other chronic pain: Secondary | ICD-10-CM

## 2019-11-13 DIAGNOSIS — M79672 Pain in left foot: Secondary | ICD-10-CM

## 2019-11-13 DIAGNOSIS — M810 Age-related osteoporosis without current pathological fracture: Secondary | ICD-10-CM

## 2019-11-13 NOTE — Assessment & Plan Note (Signed)
Increases concern for possible insufficiency fracture of the foot.

## 2019-11-13 NOTE — Assessment & Plan Note (Signed)
Moderate to severe, x-rays pending.  Discussed with patient about proper shoes, stability shoes with possible even rocker-bottom.  Follow-up with me again in 4 to 8 weeks.

## 2019-11-13 NOTE — Patient Instructions (Addendum)
[facility] [employer] Support Orthotics Rocker Bottom Shoes [place], [place] Xray today- Good to see you.  Ice 20 minutes 2 times daily. Usually after activity and before bed. Exercises 3 times a week.  pennsaid pinkie amount topically 2 times daily as needed.  Avoid being barefoot Turmeric 500mg  daily  Tart cherry extract 1200mg  at night Vitamin D 2000 IU daily  See me again in 4-5 weeks

## 2019-11-13 NOTE — Progress Notes (Signed)
[place] Phone: [phone] Subjective:   Fontaine No, am serving as a scribe for Dr. Hulan Saas. This visit occurred during the SARS-CoV-2 public health emergency.  Safety protocols were in place, including screening questions prior to the visit, additional usage of staff PPE, and extensive cleaning of exam room while observing appropriate contact time as indicated for disinfecting solutions.   I'm seeing this patient by the request  of:  [name], MD  CC: left foot pain   [id number]  Lindsey Foster is a 74 y.o. female coming in with complaint of bilateral foot pain, L>R. Feels like her feet wont hold her when she stands up. Pain increases with weight bearing. Notes swelling in left leg for 4-5 months. Feels swelling increased in past week and now left foot is swollen. Has increased walking. Pain moves throughout foot.       Past Medical History:  Diagnosis Date  . Allergic rhinitis   . Anxiety    panic attacks in hot rooms  . Arthritis   . Blood in stool   . Breast cancer ([place]) 2006   radiation - Rt  . Chickenpox   . CKD (chronic kidney disease)   . Colon polyps   . Diverticulosis   . GERD (gastroesophageal reflux disease)   . Heart murmur   . Hyperlipidemia   . Hypertension   . Hypothyroidism   . Multiple thyroid nodules   . Osteopenia   . Personal history of radiation therapy   . Urinary incontinence   . UTI (lower urinary tract infection)   . Vitamin D deficiency disease    Past Surgical History:  Procedure Laterality Date  . BLADDER SURGERY    . BREAST EXCISIONAL BIOPSY Right 2006   positive  . 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 CESAREAN SECTION  1985  . COLONOSCOPY WITH PROPOFOL N/A 09/28/2015   Procedure: COLONOSCOPY WITH PROPOFOL;  Surgeon: Lollie Sails, MD;  Location: [facility] ENDOSCOPY;  Service: Endoscopy;  Laterality: N/A;  . DILATION AND CURETTAGE OF  UTERUS  2012  . Thyroid nodule ablation  2003  . Uterine polyp removal  2009    Social History   Socioeconomic History  . Marital status: Widowed    Spouse name: Not on file  . Number of children: Not on file  . Years of education: Not on file  . Highest education level: Not on file  Occupational History  . Not on file  Tobacco Use  . Smoking status: Former Research scientist (life sciences)  . Smokeless tobacco: Never Used  Substance and Sexual Activity  . Alcohol use: No    Alcohol/week: 0.0 standard drinks  . Drug use: No  . Sexual activity: Not on file  Other Topics Concern  . Not on file  Social History Narrative  . Not on file   Social Determinants of Health   Financial Resource Strain:   . Difficulty of Paying Living Expenses:   Food Insecurity:   . Worried About Charity fundraiser in the Last Year:   . Arboriculturist in the Last Year:   Transportation Needs:   . Film/video editor (Medical):   Marland Kitchen Lack of Transportation (Non-Medical):   Physical Activity: Unknown  . Days of Exercise per Week: Not on file  . Minutes of Exercise per Session: 30 min  Stress:   . Feeling of Stress :   Social Connections:   .  Frequency of Communication with Friends and Family:   . Frequency of Social Gatherings with Friends and Family:   . Attends Religious Services:   . Active Member of Clubs or Organizations:   . Attends Archivist Meetings:   Marland Kitchen Marital Status:    Allergies  Allergen Reactions  . Latex Rash   Family History  Problem Relation Age of Onset  . Heart disease Other        Parent  . Kidney disease Other        Parent  . Uterine cancer Maternal Aunt   . Kidney disease Mother   . Angina Father   . Heart attack Father   . Multiple myeloma Brother   . Breast cancer Neg Hx      Current Outpatient Medications (Cardiovascular):  .  amLODipine (NORVASC) 2.5 MG tablet, TAKE 1 TABLET(2.5 MG) BY MOUTH DAILY  Current Outpatient Medications (Respiratory):  .  fluticasone  (FLONASE SENSIMIST) 27.5 MCG/SPRAY nasal spray, Place 1 spray into the nose daily as needed.  .  loratadine (CLARITIN) 10 MG tablet, Take 1 tablet (10 mg total) by mouth daily.    Current Outpatient Medications (Other):  Marland Kitchen  Calcium Citrate-Vitamin D3 1000-400 LIQD, Take 1 tablet by mouth daily. Reported on 02/04/2016 .  clobetasol cream (TEMOVATE) 0.05 %, Apply topically 2 (two) times daily. For 2 weeks, then just on week days .  famotidine (PEPCID) 20 MG tablet, TAKE 1 TABLET(20 MG) BY MOUTH TWICE DAILY BEFORE A MEAL .  Garlic 725 MG TABS, Take 1 tablet by mouth daily.  Marland Kitchen  ketotifen (ZADITOR) 0.025 % ophthalmic solution, 1 drop 2 (two) times daily. .  mometasone (ELOCON) 0.1 % lotion, Apply topically daily.   Reviewed prior external information including notes and imaging from  primary care provider As well as notes that were available from care everywhere and other healthcare systems.  Past medical history, social, surgical and family history all reviewed in electronic medical record.  No pertanent information unless stated regarding to the chief complaint.   Review of Systems:  No headache, visual changes, nausea, vomiting, diarrhea, constipation, dizziness, abdominal pain, skin rash, fevers, chills, night sweats, weight loss, swollen lymph nodes, body aches, joint swelling, chest pain, shortness of breath, mood changes. POSITIVE muscle aches  Objective  Blood pressure 134/82, pulse 71, height _0  (1.702 m), weight 174 lb (78.9 kg), SpO2 96 %.   General: No apparent distress alert and oriented x3 mood and affect normal, dressed appropriately.  HEENT: Pupils equal, extraocular movements intact  Respiratory: Patient's speak in full sentences and does not appear short of breath  Cardiovascular: No lower extremity edema, non tender, no erythema  Neuro: Cranial nerves II through XII are intact, neurovascularly intact in all extremities with 2+ DTRs and 2+ pulses.  Gait  antalgic MSK: Left ankle exam shows the patient does have significant arthritic changes.  Patient has limited range of motion in all planes.  Significant overpronation of the hindfoot noted. Severely tender to palpation over the third and fourth metatarsals on the dorsal aspect.  No pain on the plantar aspect.  [id number]; 15 additional minutes spent for Therapeutic exercises as stated in above notes.  This included exercises focusing on stretching, strengthening, with significant focus on eccentric aspects.   Long term goals include an improvement in range of motion, strength, endurance as well as avoiding reinjury. Patient's frequency would include in 1-2 times a day, 3-5 times a week for a duration of 6-12 weeks.  Ankle strengthening that included:  Basic range of motion exercises to allow proper full motion at ankle Stretching of the lower leg and hamstrings  Theraband exercises for the lower leg - inversion, eversion, dorsiflexion and plantarflexion each to be completed with a theraband Balance exercises to increase proprioception Weight bearing exercises to increase strength and balance  Proper technique shown and discussed handout in great detail with ATC.  All questions were discussed and answered.       Impression and Recommendations:     This case required medical decision making of moderate complexity. The above documentation has been reviewed and is accurate and complete Lyndal Pulley, DO       Note: This dictation was prepared with Dragon dictation along with smaller phrase technology. Any transcriptional errors that result from this process are unintentional.

## 2019-11-13 NOTE — Assessment & Plan Note (Signed)
Patient does have arthritic changes.  X-rays ordered today secondary to history of osteoporosis and needs to rule out any type of insufficiency fracture.  Given topical anti-inflammatory trial.  Discussed over-the-counter medications that could be beneficial.  If we do have a stress reaction I would consider the possibility of once weekly vitamin D.  Patient will do more icing regimen, discussed over-the-counter orthotics, home exercises given.  Follow-up again in 4 weeks.

## 2019-12-13 ENCOUNTER — Other Ambulatory Visit: Payer: Self-pay

## 2019-12-13 ENCOUNTER — Encounter: Payer: Self-pay | Admitting: Family Medicine

## 2019-12-13 ENCOUNTER — Ambulatory Visit (INDEPENDENT_AMBULATORY_CARE_PROVIDER_SITE_OTHER): Payer: Medicare Other | Admitting: Family Medicine

## 2019-12-13 DIAGNOSIS — M19072 Primary osteoarthritis, left ankle and foot: Secondary | ICD-10-CM

## 2019-12-13 NOTE — Progress Notes (Signed)
Lindsey Foster D.O. [facility] [address] [place] [address] Phone: [phone] Subjective:   I, Lindsey Foster, am serving as a scribe for Dr. Zachary Foster. This visit occurred during the SARS-CoV-2 public health emergency.  Safety protocols were in place, including screening questions prior to the visit, additional usage of staff PPE, and extensive cleaning of exam room while observing appropriate contact time as indicated for disinfecting solutions.   I'm seeing this patient by the request  of:  Sonnenberg, Eric G, MD  CC: Bilateral foot and ankle pain  HPI:Subjective   11/13/2019 Moderate to severe, x-rays pending.  Discussed with patient about proper shoes, stability shoes with possible even rocker-bottom.  Follow-up with me again in 4 to 8 weeks.  Increases concern for possible insufficiency fracture of the foot.  Patient does have arthritic changes.  X-rays ordered today secondary to history of osteoporosis and needs to rule out any type of insufficiency fracture.  Given topical anti-inflammatory trial.  Discussed over-the-counter medications that could be beneficial.  If we do have a stress reaction I would consider the possibility of once weekly vitamin D.  Patient will do more icing regimen, discussed over-the-counter orthotics, home exercises given.  Follow-up again in 4 weeks.  Update 12/13/2019 Lindsey Foster is a 74 y.o. female coming in with complaint of left ankle. Pain got worse but then got better. States that she does have some tenderness over top of foot. Feels like left leg is swollen. Mention she has osteoporosis. Is using Voltaren gel which is providing.  Patient does feel like she is making some progress but it is very slow.     Past Medical History:  Diagnosis Date  . Allergic rhinitis   . Anxiety    panic attacks in hot rooms  . Arthritis   . Blood in stool   . Breast cancer ([facility]) 2006   radiation - Rt  . Chickenpox   . CKD  (chronic kidney disease)   . Colon polyps   . Diverticulosis   . GERD (gastroesophageal reflux disease)   . Heart murmur   . Hyperlipidemia   . Hypertension   . Hypothyroidism   . Multiple thyroid nodules   . Osteopenia   . Personal history of radiation therapy   . Urinary incontinence   . UTI (lower urinary tract infection)   . Vitamin D deficiency disease    Past Surgical History:  Procedure Laterality Date  . BLADDER SURGERY    . BREAST EXCISIONAL BIOPSY Right 2006   positive  . 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 CESAREAN SECTION  1985  . COLONOSCOPY WITH PROPOFOL N/A 09/28/2015   Procedure: COLONOSCOPY WITH PROPOFOL;  Surgeon: Martin U Skulskie, MD;  Location: [facility] ENDOSCOPY;  Service: Endoscopy;  Laterality: N/A;  . DILATION AND CURETTAGE OF UTERUS  2012  . Thyroid nodule ablation  2003  . Uterine polyp removal  2009    Social History   Socioeconomic History  . Marital status: Widowed    Spouse name: Not on file  . Number of children: Not on file  . Years of education: Not on file  . Highest education level: Not on file  Occupational History  . Not on file  Tobacco Use  . Smoking status: Former Smoker  . Smokeless tobacco: Never Used  Substance and Sexual Activity  . Alcohol use: No    Alcohol/week: 0.0 standard drinks  . Drug use: No  . Sexual activity: Not   on file  Other Topics Concern  . Not on file  Social History Narrative  . Not on file   Social Determinants of Health   Financial Resource Strain:   . Difficulty of Paying Living Expenses:   Food Insecurity:   . Worried About Running Out of Food in the Last Year:   . Ran Out of Food in the Last Year:   Transportation Needs:   . Lack of Transportation (Medical):   . Lack of Transportation (Non-Medical):   Physical Activity: Unknown  . Days of Exercise per Week: Not on file  . Minutes of Exercise per Session: 30 min  Stress:   . Feeling of Stress :   Social Connections:   . Frequency of  Communication with Friends and Family:   . Frequency of Social Gatherings with Friends and Family:   . Attends Religious Services:   . Active Member of Clubs or Organizations:   . Attends Club or Organization Meetings:   . Marital Status:    Allergies  Allergen Reactions  . Latex Rash   Family History  Problem Relation Age of Onset  . Heart disease Other        Parent  . Kidney disease Other        Parent  . Uterine cancer Maternal Aunt   . Kidney disease Mother   . Angina Father   . Heart attack Father   . Multiple myeloma Brother   . Breast cancer Neg Hx      Current Outpatient Medications (Cardiovascular):  .  amLODipine (NORVASC) 2.5 MG tablet, TAKE 1 TABLET(2.5 MG) BY MOUTH DAILY  Current Outpatient Medications (Respiratory):  .  fluticasone (FLONASE SENSIMIST) 27.5 MCG/SPRAY nasal spray, Place 1 spray into the nose daily as needed.  .  loratadine (CLARITIN) 10 MG tablet, Take 1 tablet (10 mg total) by mouth daily.    Current Outpatient Medications (Other):  .  Calcium Citrate-Vitamin D3 1000-400 LIQD, Take 1 tablet by mouth daily. Reported on 02/04/2016 .  clobetasol cream (TEMOVATE) 0.05 %, Apply topically 2 (two) times daily. For 2 weeks, then just on week days .  famotidine (PEPCID) 20 MG tablet, TAKE 1 TABLET(20 MG) BY MOUTH TWICE DAILY BEFORE A MEAL .  Garlic 100 MG TABS, Take 1 tablet by mouth daily.  .  ketotifen (ZADITOR) 0.025 % ophthalmic solution, 1 drop 2 (two) times daily. .  mometasone (ELOCON) 0.1 % lotion, Apply topically daily.   Reviewed prior external information including notes and imaging from  primary care provider As well as notes that were available from care everywhere and other healthcare systems.  Past medical history, social, surgical and family history all reviewed in electronic medical record.  No pertanent information unless stated regarding to the chief complaint.   Review of Systems:  No headache, visual changes, nausea,  vomiting, diarrhea, constipation, dizziness, abdominal pain, skin rash, fevers, chills, night sweats, weight loss, swollen lymph nodes,  chest pain, shortness of breath, mood changes. POSITIVE muscle aches, body aches, joint swelling  Objective  Blood pressure 122/64, pulse 88, height 5' 7" (1.702 m), weight 187 lb (84.8 kg), SpO2 96 %.   General: No apparent distress alert and oriented x3 mood and affect normal, dressed appropriately.  HEENT: Pupils equal, extraocular movements intact  Respiratory: Patient's speak in full sentences and does not appear short of breath  Cardiovascular: No lower extremity edema, non tender, no erythema  Neuro: Cranial nerves II through XII are intact, neurovascularly intact in all   extremities with 2+ DTRs and 2+ pulses.  Gait antalgic.  MSK: Patient does have arthritic changes of the ankles bilaterally.  Overpronation of the hindfoot bilaterally.  Narrow foot.  Breakdown of the transverse arch noted as well.  Ankle range of motion is decreased in all planes.  Tenderness to palpation over the ankle mortise anteriorly on the left    Impression and Recommendations:     The above documentation has been reviewed and is accurate and complete Zachary M Smith, DO       Note: This dictation was prepared with Dragon dictation along with smaller phrase technology. Any transcriptional errors that result from this process are unintentional.          

## 2019-12-13 NOTE — Assessment & Plan Note (Signed)
Arthritic changes of this joint and many other joints.  Discussed totally conservative therapy with the over-the-counter medications.  Patient does have some mild topical anti-inflammatories she has tried from time to time with some mild improvement.  Patient is to increase activity slowly.  Icing regimen.  Follow-up again in 4 to 8 weeks

## 2019-12-13 NOTE — Patient Instructions (Signed)
Arnica lotion Look up shoes with Mid rocker sole Xerlero Keep doing exercises and vitamins Walk a little more each time See me in 8 weeks

## 2020-02-07 ENCOUNTER — Ambulatory Visit (INDEPENDENT_AMBULATORY_CARE_PROVIDER_SITE_OTHER): Payer: Medicare Other | Admitting: Family Medicine

## 2020-02-07 ENCOUNTER — Encounter: Payer: Self-pay | Admitting: Family Medicine

## 2020-02-07 ENCOUNTER — Other Ambulatory Visit: Payer: Self-pay

## 2020-02-07 DIAGNOSIS — M1712 Unilateral primary osteoarthritis, left knee: Secondary | ICD-10-CM

## 2020-02-07 NOTE — Patient Instructions (Addendum)
Good to see you We will get an approval for Gel injections just incase See you again in 6 weeks

## 2020-02-07 NOTE — Progress Notes (Signed)
[facility] [place] Phone: [phone] Subjective:   Rito Ehrlich, am serving as a scribe for Dr. Hulan Saas.  This visit occurred during the SARS-CoV-2 public health emergency.  Safety protocols were in place, including screening questions prior to the visit, additional usage of staff PPE, and extensive cleaning of exam room while observing appropriate contact time as indicated for disinfecting solutions.   I'm seeing this patient by the request  of:  [name], MD  CC: Knee pain  [id number]   12/13/2019 Arthritic changes of this joint and many other joints.  Discussed totally conservative therapy with the over-the-counter medications.  Patient does have some mild topical anti-inflammatories she has tried from time to time with some mild improvement.  Patient is to increase activity slowly.  Icing regimen.  Follow-up again in 4 to 8 weeks  02/07/2020 Monicka Cyran is a 74 y.o. female coming in with complaint of left ankle pain. States that her foot and ankle are better than it was can still feel some pain from time to time. Patient would like to talk about her L knee. States not sore and does not hurt, when she stands up she can feel a little pain when walking she feels like her knee is going to slip.       Past Medical History:  Diagnosis Date  . Allergic rhinitis   . Anxiety    panic attacks in hot rooms  . Arthritis   . Blood in stool   . Breast cancer ([place]) 2006   radiation - Rt  . Chickenpox   . CKD (chronic kidney disease)   . Colon polyps   . Diverticulosis   . GERD (gastroesophageal reflux disease)   . Heart murmur   . Hyperlipidemia   . Hypertension   . Hypothyroidism   . Multiple thyroid nodules   . Osteopenia   . Personal history of radiation therapy   . Urinary incontinence   . UTI (lower urinary tract infection)   . Vitamin D deficiency disease    Past Surgical  History:  Procedure Laterality Date  . BLADDER SURGERY    . BREAST EXCISIONAL BIOPSY Right 2006   positive  . 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 CESAREAN SECTION  1985  . COLONOSCOPY WITH PROPOFOL N/A 09/28/2015   Procedure: COLONOSCOPY WITH PROPOFOL;  Surgeon: Lollie Sails, MD;  Location: [facility] ENDOSCOPY;  Service: Endoscopy;  Laterality: N/A;  . DILATION AND CURETTAGE OF UTERUS  2012  . Thyroid nodule ablation  2003  . Uterine polyp removal  2009    Social History   Socioeconomic History  . Marital status: Widowed    Spouse name: Not on file  . Number of children: Not on file  . Years of education: Not on file  . Highest education level: Not on file  Occupational History  . Not on file  Tobacco Use  . Smoking status: Former Research scientist (life sciences)  . Smokeless tobacco: Never Used  Vaping Use  . Vaping Use: Never used  Substance and Sexual Activity  . Alcohol use: No    Alcohol/week: 0.0 standard drinks  . Drug use: No  . Sexual activity: Not on file  Other Topics Concern  . Not on file  Social History Narrative  . Not on file   Social Determinants of Health   Financial Resource Strain:   . Difficulty of Paying Living Expenses:   Food  Insecurity:   . Worried About Charity fundraiser in the Last Year:   . Arboriculturist in the Last Year:   Transportation Needs:   . Film/video editor (Medical):   Marland Kitchen Lack of Transportation (Non-Medical):   Physical Activity: Unknown  . Days of Exercise per Week: Not on file  . Minutes of Exercise per Session: 30 min  Stress:   . Feeling of Stress :   Social Connections:   . Frequency of Communication with Friends and Family:   . Frequency of Social Gatherings with Friends and Family:   . Attends Religious Services:   . Active Member of Clubs or Organizations:   . Attends Archivist Meetings:   Marland Kitchen Marital Status:    Allergies  Allergen Reactions  . Latex Rash   Family History  Problem Relation Age of Onset  . Heart  disease Other        Parent  . Kidney disease Other        Parent  . Uterine cancer Maternal Aunt   . Kidney disease Mother   . Angina Father   . Heart attack Father   . Multiple myeloma Brother   . Breast cancer Neg Hx      Current Outpatient Medications (Cardiovascular):  .  amLODipine (NORVASC) 2.5 MG tablet, TAKE 1 TABLET(2.5 MG) BY MOUTH DAILY  Current Outpatient Medications (Respiratory):  .  fluticasone (FLONASE SENSIMIST) 27.5 MCG/SPRAY nasal spray, Place 1 spray into the nose daily as needed.  .  loratadine (CLARITIN) 10 MG tablet, Take 1 tablet (10 mg total) by mouth daily.    Current Outpatient Medications (Other):  Marland Kitchen  Calcium Citrate-Vitamin D3 1000-400 LIQD, Take 1 tablet by mouth daily. Reported on 02/04/2016 .  clobetasol cream (TEMOVATE) 0.05 %, Apply topically 2 (two) times daily. For 2 weeks, then just on week days .  famotidine (PEPCID) 20 MG tablet, TAKE 1 TABLET(20 MG) BY MOUTH TWICE DAILY BEFORE A MEAL .  Garlic 427 MG TABS, Take 1 tablet by mouth daily.  Marland Kitchen  ketotifen (ZADITOR) 0.025 % ophthalmic solution, 1 drop 2 (two) times daily. .  mometasone (ELOCON) 0.1 % lotion, Apply topically daily.   Reviewed prior external information including notes and imaging from  primary care provider As well as notes that were available from care everywhere and other healthcare systems.  Past medical history, social, surgical and family history all reviewed in electronic medical record.  No pertanent information unless stated regarding to the chief complaint.   Review of Systems:  No headache, visual changes, nausea, vomiting, diarrhea, constipation, dizziness, abdominal pain, skin rash, fevers, chills, night sweats, weight loss, swollen lymph nodes, body aches, joint swelling, chest pain, shortness of breath, mood changes. POSITIVE muscle aches  Objective  Blood pressure 120/82, pulse 67, height '5\' 7"'$  (1.702 m), weight 187 lb (84.8 kg), SpO2 98 %.   General: No  apparent distress alert and oriented x3 mood and affect normal, dressed appropriately.  HEENT: Pupils equal, extraocular movements intact  Respiratory: Patient's speak in full sentences and does not appear short of breath  Cardiovascular: No lower extremity edema, non tender, no erythema  Neuro: Cranial nerves II through XII are intact, neurovascularly intact in all extremities with 2+ DTRs and 2+ pulses.  Gait antalgic gait Knee: Left valgus deformity noted. Large thigh to calf ratio.  Tender to palpation over medial and PF joint line.  ROM only 95 degrees of flexion noted instability with valgus force.  painful patellar compression. Patellar glide with moderate crepitus. Patellar and quadriceps tendons unremarkable. Hamstring and quadriceps strength is normal. Contralateral knee shows arthritic changes but no significant instability  After informed written and verbal consent, patient was seated on exam table. Left knee was prepped with alcohol swab and utilizing anterolateral approach, patient's left knee space was injected with 4:1  marcaine 0.5%: Kenalog '40mg'$ /dL. Patient tolerated the procedure well without immediate complications.   Impression and Recommendations:     The above documentation has been reviewed and is accurate and complete Lyndal Pulley, DO       Note: This dictation was prepared with Dragon dictation along with smaller phrase technology. Any transcriptional errors that result from this process are unintentional.

## 2020-02-07 NOTE — Assessment & Plan Note (Signed)
Patient given injection today.  Tolerated the procedure well.  Due to the instability and abnormal thigh to calf ratio we will have patient fitted for a custom OA stability brace.  I believe that this will be helpful in elongate the length and duration of her having this knee.  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  Patient could be a candidate for viscosupplementation and we will try to get approval as well.  Follow-up with me again in 6 weeks

## 2020-02-14 ENCOUNTER — Ambulatory Visit (INDEPENDENT_AMBULATORY_CARE_PROVIDER_SITE_OTHER): Payer: Medicare Other

## 2020-02-14 VITALS — Ht 67.0 in | Wt 187.0 lb

## 2020-02-14 DIAGNOSIS — Z0000 Encounter for general adult medical examination without abnormal findings: Secondary | ICD-10-CM | POA: Diagnosis not present

## 2020-02-14 NOTE — Patient Instructions (Addendum)
Lindsey Foster , Thank you for taking time to come for your Medicare Wellness Visit. I appreciate your ongoing commitment to your health goals. Please review the following plan we discussed and let me know if I can assist you in the future.   These are the goals we discussed: Goals      Patient Stated   .  DIET - EAT MORE FRUITS AND VEGETABLES (pt-stated)      Healthy snacking and meals Low salt diet       This is a list of the screening recommended for you and due dates:  Health Maintenance  Topic Date Due  . Tetanus Vaccine  Never done  . Pneumonia vaccines (1 of 2 - PCV13) 11/05/2020*  . Flu Shot  03/15/2020  . Colon Cancer Screening  09/27/2020  . DEXA scan (bone density measurement)  Completed  . COVID-19 Vaccine  Completed  *Topic was postponed. The date shown is not the original due date.    Immunizations Immunization History  Administered Date(s) Administered  . Fluad Quad(high Dose 65+) 07/17/2019  . PFIZER SARS-COV-2 Vaccination 09/26/2019, 10/17/2019   Advanced directives: End of life planning; Advance aging; Advanced directives discussed.  Copy of current HCPOA/Living Will requested.    Next appointment: Follow up in one year for your annual wellness visit   Keep all routine maintenance appointments.   Follow up 05/08/20 @ 10:30  Preventive Care 65 Years and Older, Female Preventive care refers to lifestyle choices and visits with your health care provider that can promote health and wellness. What does preventive care include?  A yearly physical exam. This is also called an annual well check.  Dental exams once or twice a year.  Routine eye exams. Ask your health care provider how often you should have your eyes checked.  Personal lifestyle choices, including:  Daily care of your teeth and gums.  Regular physical activity.  Eating a healthy diet.  Avoiding tobacco and drug use.  Limiting alcohol use.  Practicing safe sex.  Taking low-dose  aspirin every day.  Taking vitamin and mineral supplements as recommended by your health care provider. What happens during an annual well check? The services and screenings done by your health care provider during your annual well check will depend on your age, overall health, lifestyle risk factors, and family history of disease. Counseling  Your health care provider may ask you questions about your:  Alcohol use.  Tobacco use.  Drug use.  Emotional well-being.  Home and relationship well-being.  Sexual activity.  Eating habits.  History of falls.  Memory and ability to understand (cognition).  Work and work Statistician.  Reproductive health. Screening  You may have the following tests or measurements:  Height, weight, and BMI.  Blood pressure.  Lipid and cholesterol levels. These may be checked every 5 years, or more frequently if you are over 75 years old.  Skin check.  Lung cancer screening. You may have this screening every year starting at age 45 if you have a 30-pack-year history of smoking and currently smoke or have quit within the past 15 years.  Fecal occult blood test (FOBT) of the stool. You may have this test every year starting at age 58.  Flexible sigmoidoscopy or colonoscopy. You may have a sigmoidoscopy every 5 years or a colonoscopy every 10 years starting at age 43.  Hepatitis C blood test.  Hepatitis B blood test.  Sexually transmitted disease (STD) testing.  Diabetes screening. This is done by  checking your blood sugar (glucose) after you have not eaten for a while (fasting). You may have this done every 1-3 years.  Bone density scan. This is done to screen for osteoporosis. You may have this done starting at age 11.  Mammogram. This may be done every 1-2 years. Talk to your health care provider about how often you should have regular mammograms. Talk with your health care provider about your test results, treatment options, and if  necessary, the need for more tests. Vaccines  Your health care provider may recommend certain vaccines, such as:  Influenza vaccine. This is recommended every year.  Tetanus, diphtheria, and acellular pertussis (Tdap, Td) vaccine. You may need a Td booster every 10 years.  Zoster vaccine. You may need this after age 20.  Pneumococcal 13-valent conjugate (PCV13) vaccine. One dose is recommended after age 68.  Pneumococcal polysaccharide (PPSV23) vaccine. One dose is recommended after age 56. Talk to your health care provider about which screenings and vaccines you need and how often you need them. This information is not intended to replace advice given to you by your health care provider. Make sure you discuss any questions you have with your health care provider. Document Released: 08/28/2015 Document Revised: 04/20/2016 Document Reviewed: 06/02/2015 Elsevier Interactive Patient Education  2017 [employer] Prevention in the Home Falls can cause injuries. They can happen to people of all ages. There are many things you can do to make your home safe and to help prevent falls. What can I do on the outside of my home?  Regularly fix the edges of walkways and driveways and fix any cracks.  Remove anything that might make you trip as you walk through a door, such as a raised step or threshold.  Trim any bushes or trees on the path to your home.  Use bright outdoor lighting.  Clear any walking paths of anything that might make someone trip, such as rocks or tools.  Regularly check to see if handrails are loose or broken. Make sure that both sides of any steps have handrails.  Any raised decks and porches should have guardrails on the edges.  Have any leaves, snow, or ice cleared regularly.  Use sand or salt on walking paths during winter.  Clean up any spills in your garage right away. This includes oil or grease spills. What can I do in the bathroom?  Use night  lights.  Install grab bars by the toilet and in the tub and shower. Do not use towel bars as grab bars.  Use non-skid mats or decals in the tub or shower.  If you need to sit down in the shower, use a plastic, non-slip stool.  Keep the floor dry. Clean up any water that spills on the floor as soon as it happens.  Remove soap buildup in the tub or shower regularly.  Attach bath mats securely with double-sided non-slip rug tape.  Do not have throw rugs and other things on the floor that can make you trip. What can I do in the bedroom?  Use night lights.  Make sure that you have a light by your bed that is easy to reach.  Do not use any sheets or blankets that are too big for your bed. They should not hang down onto the floor.  Have a firm chair that has side arms. You can use this for support while you get dressed.  Do not have throw rugs and other things on the  floor that can make you trip. What can I do in the kitchen?  Clean up any spills right away.  Avoid walking on wet floors.  Keep items that you use a lot in easy-to-reach places.  If you need to reach something above you, use a strong step stool that has a grab bar.  Keep electrical cords out of the way.  Do not use floor polish or wax that makes floors slippery. If you must use wax, use non-skid floor wax.  Do not have throw rugs and other things on the floor that can make you trip. What can I do with my stairs?  Do not leave any items on the stairs.  Make sure that there are handrails on both sides of the stairs and use them. Fix handrails that are broken or loose. Make sure that handrails are as long as the stairways.  Check any carpeting to make sure that it is firmly attached to the stairs. Fix any carpet that is loose or worn.  Avoid having throw rugs at the top or bottom of the stairs. If you do have throw rugs, attach them to the floor with carpet tape.  Make sure that you have a light switch at the  top of the stairs and the bottom of the stairs. If you do not have them, ask someone to add them for you. What else can I do to help prevent falls?  Wear shoes that:  Do not have high heels.  Have rubber bottoms.  Are comfortable and fit you well.  Are closed at the toe. Do not wear sandals.  If you use a stepladder:  Make sure that it is fully opened. Do not climb a closed stepladder.  Make sure that both sides of the stepladder are locked into place.  Ask someone to hold it for you, if possible.  Clearly mark and make sure that you can see:  Any grab bars or handrails.  First and last steps.  Where the edge of each step is.  Use tools that help you move around (mobility aids) if they are needed. These include:  Canes.  Walkers.  Scooters.  Crutches.  Turn on the lights when you go into a dark area. Replace any light bulbs as soon as they burn out.  Set up your furniture so you have a clear path. Avoid moving your furniture around.  If any of your floors are uneven, fix them.  If there are any pets around you, be aware of where they are.  Review your medicines with your doctor. Some medicines can make you feel dizzy. This can increase your chance of falling. Ask your doctor what other things that you can do to help prevent falls. This information is not intended to replace advice given to you by your health care provider. Make sure you discuss any questions you have with your health care provider. Document Released: 05/28/2009 Document Revised: 01/07/2016 Document Reviewed: 09/05/2014 Elsevier Interactive Patient Education  2017 [employer].

## 2020-02-14 NOTE — Progress Notes (Signed)
Subjective:   Lindsey Foster is a 74 y.o. female who presents for Medicare Annual (Subsequent) preventive examination.  Review of Systems    No ROS.  Medicare Wellness Virtual Visit.  Cardiac Risk Factors include: advanced age (>78mn, >>37women);hypertension     Objective:    Today's Vitals   02/14/20 0936  Weight: 187 lb (84.8 kg)  Height: 5' 7" (1.702 m)   Body mass index is 29.29 kg/m.  Advanced Directives 02/14/2020 08/01/2019 02/13/2019 07/31/2018 02/08/2018 07/27/2017 02/07/2017  Does Patient Have a Medical Advance Directive? _0  Yes Yes  Type of [place] Living [place];Healthcare Power of Attorney Living will;Healthcare Power of Attorney - Living will;Healthcare Power of Attorney Living will [place]  Does patient want to make changes to medical advance directive? No - Patient declined No - Patient declined No - Patient declined - No - Patient declined - No - Patient declined  Copy of [place] Chart? No - copy requested No - copy requested No - copy requested - No - copy requested - No - copy requested    Current Medications (verified) Outpatient Encounter Medications as of 02/14/2020  Medication Sig  . amLODipine (NORVASC) 2.5 MG tablet TAKE 1 TABLET(2.5 MG) BY MOUTH DAILY  . Calcium Citrate-Vitamin D3 1000-400 LIQD Take 1 tablet by mouth daily. Reported on 02/04/2016  . clobetasol cream (TEMOVATE) 0.05 % Apply topically 2 (two) times daily. For 2 weeks, then just on week days  . famotidine (PEPCID) 20 MG tablet TAKE 1 TABLET(20 MG) BY MOUTH TWICE DAILY BEFORE A MEAL  . fluticasone (FLONASE SENSIMIST) 27.5 MCG/SPRAY nasal spray Place 1 spray into the nose daily as needed.   . Garlic 1892MG TABS Take 1 tablet by mouth daily.   .Marland Kitchenketotifen (ZADITOR) 0.025 % ophthalmic solution 1 drop 2 (two) times daily.  .Marland Kitchenloratadine (CLARITIN) 10 MG tablet Take 1 tablet  (10 mg total) by mouth daily.  . mometasone (ELOCON) 0.1 % lotion Apply topically daily.   No facility-administered encounter medications on file as of 02/14/2020.    Allergies (verified) Latex   History: Past Medical History:  Diagnosis Date  . Allergic rhinitis   . Anxiety    panic attacks in hot rooms  . Arthritis   . Blood in stool   . [place]   radiation - Rt  . Chickenpox   . CKD (chronic kidney disease)   . Colon polyps   . Diverticulosis   . GERD (gastroesophageal reflux disease)   . Heart murmur   . Hyperlipidemia   . Hypertension   . Hypothyroidism   . Multiple thyroid nodules   . Osteopenia   . Personal history of radiation therapy   . Urinary incontinence   . UTI (lower urinary tract infection)   . Vitamin D deficiency disease    Past Surgical History:  Procedure Laterality Date  . BLADDER SURGERY    . BREAST EXCISIONAL BIOPSY Right 2006   positive  . 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 CESAREAN SECTION  1985  . COLONOSCOPY WITH PROPOFOL N/A 09/28/2015   Procedure: COLONOSCOPY WITH PROPOFOL;  Surgeon: MLollie Sails MD;  [facility];  Service: Endoscopy;  Laterality: N/A;  . DILATION AND CURETTAGE OF UTERUS  2012  . Thyroid nodule ablation  2003  . Uterine polyp removal  2009    Family History  Problem Relation Age of Onset  . Heart disease Other        Parent  . Kidney disease Other        Parent  . Uterine cancer Maternal Aunt   . Kidney disease Mother   . Angina Father   . Heart attack Father   . Multiple myeloma Brother   . Breast cancer Neg Hx    Social History   Socioeconomic History  . Marital status: Widowed    Spouse name: Not on file  . Number of children: Not on file  . Years of education: Not on file  . Highest education level: Not on file  Occupational History  . Not on file  Tobacco Use  . Smoking status: Former Research scientist (life sciences)  . Smokeless tobacco: Never Used  Vaping Use  . Vaping Use: Never  used  Substance and Sexual Activity  . Alcohol use: No    Alcohol/week: 0.0 standard drinks  . Drug use: No  . Sexual activity: Not on file  Other Topics Concern  . Not on file  Social History Narrative  . Not on file   Social Determinants of Health   Financial Resource Strain:   . Difficulty of Paying Living Expenses:   Food Insecurity:   . Worried About Charity fundraiser in the Last Year:   . Arboriculturist in the Last Year:   Transportation Needs:   . Film/video editor (Medical):   Marland Kitchen Lack of Transportation (Non-Medical):   Physical Activity:   . Days of Exercise per Week:   . Minutes of Exercise per Session:   Stress:   . Feeling of Stress :   Social Connections:   . Frequency of Communication with Friends and Family:   . Frequency of Social Gatherings with Friends and Family:   . Attends Religious Services:   . Active Member of Clubs or Organizations:   . Attends Archivist Meetings:   Marland Kitchen Marital Status:     Tobacco Counseling Counseling given: Not Answered   Clinical Intake:  Pre-visit preparation completed: Yes           How often do you need to have someone help you when you read instructions, pamphlets, or other written materials from your doctor or pharmacy?: 1 - Never        Activities of Daily Living In your present state of health, do you have any difficulty performing the following activities: 02/14/2020  Hearing? N  Vision? N  Difficulty concentrating or making decisions? N  Walking or climbing stairs? N  Dressing or bathing? N  Doing errands, shopping? N  Preparing Food and eating ? N  Using the Toilet? N  In the past six months, have you accidently leaked urine? N  Do you have problems with loss of bowel control? N  Managing your Medications? N  Managing your Finances? N  Housekeeping or managing your Housekeeping? N  Some recent data might be hidden    Patient Care Team: [name], MD as PCP - General  (Family Medicine)  Indicate any recent Medical Services you may have received from other than [facility] providers in the past year (date may be approximate).     Assessment:   This is a routine wellness examination for [name].  I connected with Lindsey Foster today by telephone and verified that I am speaking with the correct person using two identifiers. Location patient: home Location provider: work Persons participating in the virtual visit:  patient, nurse.    I discussed the limitations, risks, security and privacy concerns of performing an evaluation and management service by telephone and the availability of in person appointments. The patient expressed understanding and verbally consented to this telephonic visit.    Interactive audio and video telecommunications were attempted between this provider and patient, however failed, due to patient having technical difficulties OR patient did not have access to video capability.  We continued and completed visit with audio only.  Some vital signs may be absent or patient reported.   Hearing/Vision screen  Hearing Screening   125Hz 250Hz 500Hz 1000Hz 2000Hz 3000Hz 4000Hz 6000Hz 8000Hz  Right ear:           Left ear:           Comments: Patient is able to hear conversational tones without difficulty.  No issues reported.    Vision Screening Comments: Followed by [facility] Wears corrective lenses Visual acuity not assessed, virtual visit.  They have seen their ophthalmologist.     Dietary issues and exercise activities discussed: Current Exercise Habits: The patient does not participate in regular exercise at presentLow salt diet Good water iintake  Goals      Patient Stated   .  DIET - EAT MORE FRUITS AND VEGETABLES (pt-stated)      Healthy snacking and meals Low salt diet      Depression Screen PHQ 2/9 Scores 02/14/2020 11/06/2019 02/13/2019 07/24/2018 02/08/2018 12/13/2016 08/11/2016  PHQ - 2 Score 0 0 1 1 0 0 0  PHQ- 9  Score - - - - - 0 -    Fall Risk Fall Risk  02/14/2020 11/06/2019 02/13/2019 07/24/2018 02/08/2018  Falls in the past year? 0 0 0 0 No  Number falls in past yr: 0 0 - - -  Injury with Fall? - - - 0 -  Follow up Falls evaluation completed Falls evaluation completed - - -    Handrails in use when climbing stairs? Yes Home free of loose throw rugs in walkways, pet beds, electrical cords, etc? Yes  Adequate lighting in your home to reduce risk of falls? Yes   ASSISTIVE DEVICES UTILIZED TO PREVENT FALLS:  Life alert? No  Use of a cane, walker or w/c? No  Grab bars in the bathroom? No  Shower chair or bench in shower? No  Elevated toilet seat or a handicapped toilet? No   TIMED UP AND GO:  Was the test performed? No . Virtual visit.   Cognitive Function: MMSE - Mini Mental State Exam 02/08/2018 02/07/2017  Orientation to time 5 5  Orientation to Place 5 5  Registration 3 3  Attention/ Calculation 5 5  Recall 3 3  Language- name 2 objects 2 2  Language- repeat 1 1  Language- follow 3 step command 3 3  Language- read & follow direction 1 1  Write a sentence 1 1  Copy design 1 1  Total score 30 30     6CIT Screen 02/14/2020 02/13/2019  What Year? 0 points 0 points  What month? 0 points 0 points  What time? - 0 points  Count back from 20 - 0 points  Months in reverse 0 points 0 points  Repeat phrase 0 points -    Immunizations Immunization History  Administered Date(s) Administered  . Fluad Quad(high Dose 65+) 07/17/2019  . PFIZER SARS-COV-2 Vaccination 09/26/2019, 10/17/2019    TDAP status: Due, Education has been provided regarding the importance of this vaccine.  Advised may receive this vaccine at local pharmacy or Health Dept. Aware to provide a copy of the vaccination record if obtained from local pharmacy or Health Dept. Verbalized acceptance and understanding.  Zostavax completed- declined   Health Maintenance Health Maintenance  Topic Date Due  . TETANUS/TDAP   Never done  . PNA vac Low Risk Adult (1 of 2 - PCV13) 11/05/2020 (Originally 11/16/2008)  . INFLUENZA VACCINE  03/15/2020  . COLONOSCOPY  09/27/2020  . DEXA SCAN  Completed  . COVID-19 Vaccine  Completed   Dental Screening: Recommended annual dental exams for proper oral hygiene  Community Resource Referral / Chronic Care Management: CRR required this visit?  No   CCM required this visit?  No      Plan:   Keep all routine maintenance appointments.   Follow up 05/08/20 @ 10:30  I have personally reviewed and noted the following in the patient's chart:   . Medical and social history . Use of alcohol, tobacco or illicit drugs  . Current medications and supplements . Functional ability and status . Nutritional status . Physical activity . Advanced directives . List of other physicians . Hospitalizations, surgeries, and ER visits in previous 12 months . Vitals . Screenings to include cognitive, depression, and falls . Referrals and appointments  In addition, I have reviewed and discussed with patient certain preventive protocols, quality metrics, and best practice recommendations. A written personalized care plan for preventive services as well as general preventive health recommendations were provided to patient via mychart.     Varney Biles, LPN   0/08/930

## 2020-03-02 DIAGNOSIS — N2581 Secondary hyperparathyroidism of renal origin: Secondary | ICD-10-CM | POA: Diagnosis not present

## 2020-03-02 DIAGNOSIS — I10 Essential (primary) hypertension: Secondary | ICD-10-CM | POA: Diagnosis not present

## 2020-03-02 DIAGNOSIS — N1831 Chronic kidney disease, stage 3a: Secondary | ICD-10-CM | POA: Diagnosis not present

## 2020-03-03 DIAGNOSIS — I10 Essential (primary) hypertension: Secondary | ICD-10-CM | POA: Diagnosis not present

## 2020-03-03 DIAGNOSIS — E7849 Other hyperlipidemia: Secondary | ICD-10-CM | POA: Diagnosis not present

## 2020-03-05 DIAGNOSIS — N1831 Chronic kidney disease, stage 3a: Secondary | ICD-10-CM | POA: Diagnosis not present

## 2020-03-05 DIAGNOSIS — I10 Essential (primary) hypertension: Secondary | ICD-10-CM | POA: Diagnosis not present

## 2020-03-05 DIAGNOSIS — N2581 Secondary hyperparathyroidism of renal origin: Secondary | ICD-10-CM | POA: Diagnosis not present

## 2020-03-18 ENCOUNTER — Other Ambulatory Visit: Payer: Self-pay | Admitting: Family Medicine

## 2020-03-20 ENCOUNTER — Encounter: Payer: Self-pay | Admitting: Family Medicine

## 2020-03-20 ENCOUNTER — Other Ambulatory Visit: Payer: Self-pay

## 2020-03-20 ENCOUNTER — Ambulatory Visit (INDEPENDENT_AMBULATORY_CARE_PROVIDER_SITE_OTHER): Payer: Medicare Other | Admitting: Family Medicine

## 2020-03-20 ENCOUNTER — Ambulatory Visit: Payer: Self-pay

## 2020-03-20 VITALS — BP 124/82 | HR 66 | Ht 67.0 in | Wt 176.0 lb

## 2020-03-20 DIAGNOSIS — M1712 Unilateral primary osteoarthritis, left knee: Secondary | ICD-10-CM | POA: Diagnosis not present

## 2020-03-20 DIAGNOSIS — M79672 Pain in left foot: Secondary | ICD-10-CM

## 2020-03-20 NOTE — Progress Notes (Signed)
[place] Phone: [phone] Subjective:   Lindsey Foster, am serving as a scribe for Dr. Hulan Saas. This visit occurred during the SARS-CoV-2 public health emergency.  Safety protocols were in place, including screening questions prior to the visit, additional usage of staff PPE, and extensive cleaning of exam room while observing appropriate contact time as indicated for disinfecting solutions.   I'm seeing this patient by the request  of:  [name], MD  CC: Left foot and ankle pain and knee pain follow-up  [id number]   11/13/2019 Moderate to severe, x-rays pending.  Discussed with patient about proper shoes, stability shoes with possible even rocker-bottom.  Follow-up with me again in 4 to 8 weeks.  Increases concern for possible insufficiency fracture of the foot.  Patient does have arthritic changes.  X-rays ordered today secondary to history of osteoporosis and needs to rule out any type of insufficiency fracture.  Given topical anti-inflammatory trial.  Discussed over-the-counter medications that could be beneficial.  If we do have a stress reaction I would consider the possibility of once weekly vitamin D.  Patient will do more icing regimen, discussed over-the-counter orthotics, home exercises given.  Follow-up again in 4 weeks.  Update 03/20/2020 Lindsey Foster is a 74 y.o. female coming in with complaint of left foot and ankle pain. Patient states that she is using the custom knee brace but it is helping to alleviate her pain. Having a hard time wearing brace for prolonged periods due to pressure on outside of knee.   Left foot pain has improved.  100% better     Past Medical History:  Diagnosis Date  . Allergic rhinitis   . Anxiety    panic attacks in hot rooms  . Arthritis   . Blood in stool   . Breast cancer ([place]) 2006   radiation - Rt  . Chickenpox   . CKD (chronic kidney  disease)   . Colon polyps   . Diverticulosis   . GERD (gastroesophageal reflux disease)   . Heart murmur   . Hyperlipidemia   . Hypertension   . Hypothyroidism   . Multiple thyroid nodules   . Osteopenia   . Personal history of radiation therapy   . Urinary incontinence   . UTI (lower urinary tract infection)   . Vitamin D deficiency disease    Past Surgical History:  Procedure Laterality Date  . BLADDER SURGERY    . BREAST EXCISIONAL BIOPSY Right 2006   positive  . 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 CESAREAN SECTION  1985  . COLONOSCOPY WITH PROPOFOL N/A 09/28/2015   Procedure: COLONOSCOPY WITH PROPOFOL;  Surgeon: Lollie Sails, MD;  Location: [facility] ENDOSCOPY;  Service: Endoscopy;  Laterality: N/A;  . DILATION AND CURETTAGE OF UTERUS  2012  . Thyroid nodule ablation  2003  . Uterine polyp removal  2009    Social History   Socioeconomic History  . Marital status: Widowed    Spouse name: Not on file  . Number of children: Not on file  . Years of education: Not on file  . Highest education level: Not on file  Occupational History  . Not on file  Tobacco Use  . Smoking status: Former Research scientist (life sciences)  . Smokeless tobacco: Never Used  Vaping Use  . Vaping Use: Never used  Substance and Sexual Activity  . Alcohol use: Foster    Alcohol/week: 0.0 standard drinks  .  Drug use: Foster  . Sexual activity: Not on file  Other Topics Concern  . Not on file  Social History Narrative  . Not on file   Social Determinants of Health   Financial Resource Strain:   . Difficulty of Paying Living Expenses:   Food Insecurity:   . Worried About Programme researcher, broadcasting/film/video in the Last Year:   . Barista in the Last Year:   Transportation Needs:   . Freight forwarder (Medical):   Marland Kitchen Lack of Transportation (Non-Medical):   Physical Activity:   . Days of Exercise per Week:   . Minutes of Exercise per Session:   Stress:   . Feeling of Stress :   Social Connections:   . Frequency of  Communication with Friends and Family:   . Frequency of Social Gatherings with Friends and Family:   . Attends Religious Services:   . Active Member of Clubs or Organizations:   . Attends Banker Meetings:   Marland Kitchen Marital Status:    Allergies  Allergen Reactions  . Latex Rash   Family History  Problem Relation Age of Onset  . Heart disease Other        Parent  . Kidney disease Other        Parent  . Uterine cancer Maternal Aunt   . Kidney disease Mother   . Angina Father   . Heart attack Father   . Multiple myeloma Brother   . Breast cancer Neg Hx      Current Outpatient Medications (Cardiovascular):  .  amLODipine (NORVASC) 2.5 MG tablet, TAKE 1 TABLET(2.5 MG) BY MOUTH DAILY  Current Outpatient Medications (Respiratory):  .  fluticasone (FLONASE SENSIMIST) 27.5 MCG/SPRAY nasal spray, Place 1 spray into the nose daily as needed.  .  loratadine (CLARITIN) 10 MG tablet, Take 1 tablet (10 mg total) by mouth daily.    Current Outpatient Medications (Other):  Marland Kitchen  Calcium Citrate-Vitamin D3 1000-400 LIQD, Take 1 tablet by mouth daily. Reported on 02/04/2016 .  clobetasol cream (TEMOVATE) 0.05 %, Apply topically 2 (two) times daily. For 2 weeks, then just on week days .  famotidine (PEPCID) 20 MG tablet, TAKE 1 TABLET(20 MG) BY MOUTH TWICE DAILY BEFORE A MEAL .  Garlic 100 MG TABS, Take 1 tablet by mouth daily.  Marland Kitchen  ketotifen (ZADITOR) 0.025 % ophthalmic solution, 1 drop 2 (two) times daily. .  mometasone (ELOCON) 0.1 % lotion, Apply topically daily.   Reviewed prior external information including notes and imaging from  primary care provider As well as notes that were available from care everywhere and other healthcare systems.  Past medical history, social, surgical and family history all reviewed in electronic medical record.  Foster pertanent information unless stated regarding to the chief complaint.   Review of Systems:  Foster headache, visual changes, nausea,  vomiting, diarrhea, constipation, dizziness, abdominal pain, skin rash, fevers, chills, night sweats, weight loss, swollen lymph nodes, body aches,  chest pain, shortness of breath, mood changes. POSITIVE muscle aches, joint swelling  Objective  Blood pressure 124/82, pulse 66, height 5\' 7"  (1.702 m), weight 176 lb (79.8 kg), SpO2 99 %.   General: Foster apparent distress alert and oriented x3 mood and affect normal, dressed appropriately.  HEENT: Pupils equal, extraocular movements intact  Respiratory: Patient's speak in full sentences and does not appear short of breath  Gait antalgic Patient is wearing custom brace.  Foster swelling noted of the knee.  Minimally tender.  Impression and Recommendations:     The above documentation has been reviewed and is accurate and complete Lyndal Pulley, DO       Note: This dictation was prepared with Dragon dictation along with smaller phrase technology. Any transcriptional errors that result from this process are unintentional.

## 2020-03-20 NOTE — Patient Instructions (Signed)
You are doing great Keep it up Consider compression sleeve under the brace See me in 3 months

## 2020-03-20 NOTE — Assessment & Plan Note (Signed)
Patient's knee exam does show that patient does have some arthritic changes still noted.  Wearing custom brace.  Did not feel that the steroid injection has been helpful but we discussed different injections including viscosupplementation and the possibility of even PRP.  Patient will read on these and decide if that is something that is necessary at this time.  Discussed which activities to doing which wants to avoid.  Increase activity slowly.  Follow-up again in 4 to 8 weeks.

## 2020-03-27 ENCOUNTER — Telehealth: Payer: Self-pay | Admitting: Family Medicine

## 2020-03-27 NOTE — Telephone Encounter (Signed)
Pt requesting paperwork for a 11 hour flight for a isle seat because she has ortho arthritis and she would benefit from isle seat she has to a written note from her provider

## 2020-03-30 NOTE — Telephone Encounter (Signed)
Pt requesting paperwork for a 11 hour flight for a isle seat because she has ortho arthritis and she would benefit from isle seat she has to a written note from her provider, the explanation of this is in your sign basket to review.  Lindsey Foster,cma

## 2020-03-31 NOTE — Telephone Encounter (Signed)
Letter printed. Please make available.

## 2020-03-31 NOTE — Telephone Encounter (Signed)
I called the patient and informed her that her letter is ready to be picked up at the front desk and she understood.  Lindsey Foster,cma

## 2020-04-07 ENCOUNTER — Other Ambulatory Visit: Payer: Self-pay

## 2020-04-07 ENCOUNTER — Encounter: Payer: Self-pay | Admitting: Nurse Practitioner

## 2020-04-07 ENCOUNTER — Telehealth (INDEPENDENT_AMBULATORY_CARE_PROVIDER_SITE_OTHER): Payer: Medicare Other | Admitting: Nurse Practitioner

## 2020-04-07 VITALS — BP 128/67 | HR 69 | Temp 98.0°F | Ht 67.0 in | Wt 180.0 lb

## 2020-04-07 DIAGNOSIS — R197 Diarrhea, unspecified: Secondary | ICD-10-CM | POA: Diagnosis not present

## 2020-04-07 DIAGNOSIS — K625 Hemorrhage of anus and rectum: Secondary | ICD-10-CM

## 2020-04-07 DIAGNOSIS — M8580 Other specified disorders of bone density and structure, unspecified site: Secondary | ICD-10-CM | POA: Insufficient documentation

## 2020-04-07 NOTE — Progress Notes (Signed)
Virtual Visit via VIDEO Note  This visit type was conducted due to national recommendations for restrictions regarding the COVID-19 pandemic (e.g. social distancing).  This format is felt to be most appropriate for this patient at this time.  All issues noted in this document were discussed and addressed.  No physical exam was performed (except for noted visual exam findings with Video Visits).   I connected with@ on 04/07/20 at  2:00 PM EDT by a video enabled telemedicine application or telephone and verified that I am speaking with the correct person using two identifiers. Location patient: home Location provider: work or home office Persons participating in the virtual visit: patient, provider  I discussed the limitations, risks, security and privacy concerns of performing an evaluation and management service by telephone and the availability of in person appointments. I also discussed with the patient that there may be a patient responsible charge related to this service. The patient expressed understanding and agreed to proceed.  Reason for visit: GI symptoms abdominal pain and diarrhea onset FRI and have improved   HPI: This 74 year old with medical history of hypertension, GERD on Pepcid, blood in the stool, reports acute episode of abdominal pain and diarrhea Friday at 9 PM.  Thursday she ate a higher fat meal than normal which was restaurant potato salad and fried chicken. She is aware that when she eats higher fats and oils that it causes diarrhea.  She notes that the diarrhea was not urgent, more of an  upset stomach and she sat on the toilet and had to wait for it.  She passed clumps of stool it was not watery.  She went to bed, slept a few hours, got up at midnight, had more stool.  She did see a tablespoon of blood in the commode and mucus.  She only saw blood one time.  She did have discomfort in the middle of the abdomen below the umbilicus, that was intermittent with significant  cramps. She took Pepto-Bismol on Saturday and that helped  The diarrhea stopped on Saturday evening.  She ate chicken and rice, soup, and toast and tolerated that well.  There was no nausea or vomiting. Sunday had cramps, relieved with  flatus. Rectum sore from wiping.  No fevers or chills.  She had no further bowel movement on Sunday or Monday. Today she had her first stool at 11 AM that was formed, pellets stuck together, dark in color.  She is feeling a whole lot better today. No abdomen pain now.   She does wonder if she has an umbilical hernia because her umbilicus used to be an "inny" and now it is an "outy". She has no pain over the umbilicus she does report that she has a  diastasis recti.  Her niece had an emergency hernia repair when she had symptoms of nausea and diarrhea. Patient is in favor of a referral to GI .  Colonoscopy 09/28/2015 obtained for rectal bleeding, abdominal pain, weight loss revealed diverticulosis.    ROS: See pertinent positives and negatives per HPI.  Past Medical History:  Diagnosis Date  . Allergic rhinitis   . Anxiety    panic attacks in hot rooms  . Arthritis   . Blood in stool   . Breast cancer ([place]) 2006   radiation - Rt  . Chickenpox   . CKD (chronic kidney disease)   . Colon polyps   . Diverticulosis   . GERD (gastroesophageal reflux disease)   . Heart murmur   .  Hyperlipidemia   . Hypertension   . Hypothyroidism   . Multiple thyroid nodules   . Osteopenia   . Personal history of radiation therapy   . Urinary incontinence   . UTI (lower urinary tract infection)   . Vitamin D deficiency disease     Past Surgical History:  Procedure Laterality Date  . BLADDER SURGERY    . BREAST EXCISIONAL BIOPSY Right 2006   positive  . 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 CESAREAN SECTION  1985  . COLONOSCOPY WITH PROPOFOL N/A 09/28/2015   Procedure: COLONOSCOPY WITH PROPOFOL;  Surgeon: Lollie Sails, MD;  Location: [facility] ENDOSCOPY;  Service:  Endoscopy;  Laterality: N/A;  . DILATION AND CURETTAGE OF UTERUS  2012  . Thyroid nodule ablation  2003  . Uterine polyp removal  2009     Family History  Problem Relation Age of Onset  . Heart disease Other        Parent  . Kidney disease Other        Parent  . Uterine cancer Maternal Aunt   . Kidney disease Mother   . Angina Father   . Heart attack Father   . Multiple myeloma Brother   . Breast cancer Neg Hx     SOCIAL HX: Former smoker   Current Outpatient Medications:  .  amLODipine (NORVASC) 2.5 MG tablet, TAKE 1 TABLET(2.5 MG) BY MOUTH DAILY, Disp: 90 tablet, Rfl: 1 .  Calcium Citrate-Vitamin D3 1000-400 LIQD, Take 1 tablet by mouth daily. Reported on 02/04/2016, Disp: , Rfl:  .  clobetasol cream (TEMOVATE) 0.05 %, Apply topically 2 (two) times daily. For 2 weeks, then just on week days, Disp: 60 g, Rfl: 1 .  famotidine (PEPCID) 20 MG tablet, TAKE 1 TABLET(20 MG) BY MOUTH TWICE DAILY BEFORE A MEAL, Disp: 180 tablet, Rfl: 1 .  fluticasone (FLONASE SENSIMIST) 27.5 MCG/SPRAY nasal spray, Place 1 spray into the nose daily as needed. , Disp: , Rfl:  .  Garlic 630 MG TABS, Take 1 tablet by mouth daily. , Disp: , Rfl:  .  ketotifen (ZADITOR) 0.025 % ophthalmic solution, 1 drop 2 (two) times daily., Disp: , Rfl:  .  loratadine (CLARITIN) 10 MG tablet, Take 1 tablet (10 mg total) by mouth daily., Disp: 30 tablet, Rfl: 11 .  mometasone (ELOCON) 0.1 % lotion, Apply topically daily., Disp: , Rfl:   EXAM:  VITALS per patient if applicable:  GENERAL: alert, oriented, appears well and in no acute distress  HEENT: atraumatic, conjunctiva clear, no obvious abnormalities on inspection of external nose and ears  NECK: normal movements of the head and neck  LUNGS: on inspection no signs of respiratory distress, breathing rate appears normal, no obvious gross SOB, gasping or wheezing  CV: no obvious cyanosis  ABD: She presses on her adromia and reports no tenderness.  MS: moves all  visible extremities without noticeable abnormality  PSYCH/NEURO: pleasant and cooperative, no obvious depression or anxiety, speech and thought processing grossly intact  ASSESSMENT AND PLAN:  Discussed the following assessment and plan:  BRBPR (bright red blood per rectum) - Plan: Ambulatory referral to Gastroenterology, CBC with Differential/Platelet  Diarrhea, unspecified type - Plan: Ambulatory referral to Gastroenterology, CBC with Differential/Platelet, Comp Met (CMET)  No problem-specific Assessment & Plan notes found for this encounter.  Diarrhea and terrible lower abdominal cramps associated with  fried food and resolved in 24 hours. She recalls diarrhea in the past with higher fat content. No RUQ pain,  n/v/ or biliary colic type of pain.  Pepto-Bismol helped. She has no abdominal  pain today. Formed stool today.  She did report a tablespoon of blood with mucus in the commode. Most recent  colonoscopy 2017 performed for blood in stool, abd pain and wt loss revealed diverticulosis. She has no abdominal pain or tenderness when she presses on her abdomen.  Do not suspect diverticulitis.  Do not suspect cholecystitis.  Etiology most likely gastroenteritis, irritable bowel, with possible hemorrhoid.  Patient reports that her umbilicus is protruding more than normal, but is non tender.  We will plan to get a Covid test with new GI symptoms. Obtain CBC for history of rectal blood loss and chemistry with history of diarrhea.  She would like to GI referral.  I discussed the assessment and treatment plan with the patient. The patient was provided an opportunity to ask questions and all were answered. The patient agreed with the plan and demonstrated an understanding of the instructions.   The patient was advised to call back or seek an in-person evaluation if the symptoms worsen or if the condition fails to improve as anticipated.  I provided 30 minutes of non-face-to-face time during this  encounter. Denice Paradise, NP Adult Nurse Practitioner [place] [phone]

## 2020-04-07 NOTE — Patient Instructions (Addendum)
Placed a referral to GI and you will be called.   Go to the [place] at [facility] to the lab for CBC and chemistry and we will call with the results.  Covid test - [phone]   Call back if you develop diarrhea again or abdominal again.  Diaper ointment around rectum for irritation   Get help right away if:  You have new or increased rectal bleeding.  You have black or dark red stools.  You vomit blood or something that looks like coffee grounds.  You have pain or tenderness in your abdomen.  You have a fever.  You feel weak.  You feel nauseous.  You faint.  You have severe pain in your rectum.  You cannot have a bowel movement.  Chronic Diarrhea Chronic diarrhea is a condition in which a person passes frequent loose and watery stools for 4 weeks or longer. Non-chronic diarrhea usually lasts for only 2-3 days. Diarrhea can cause a person to feel weak and dehydrated. Dehydration can make the person tired and thirsty. It can also cause a dry mouth, decreased urination, and dark yellow urine. Diarrhea is a sign of an underlying problem, such as:  Infection.  Side effects of medicines.  Problems digesting something in your diet, such as milk products if you have lactose intolerance.  Conditions such as celiac disease, irritable bowel syndrome (IBS), or inflammatory bowel disease (IBD). If you have chronic diarrhea, make sure you treat it as told by your health care provider. Follow these instructions at home: Medicines  Take over-the-counter and prescription medicines only as told by your health care provider.  If you were prescribed an antibiotic medicine, take it as told by your health care provider. Do not stop taking the antibiotic even if you start to feel better. Eating and drinking   Follow instructions from your health care provider about what to eat and drink. You may have to: ? Avoid foods that trigger diarrhea for you. ? Take an oral rehydration solution  (ORS). This is a drink that keeps you hydrated. It can be found at pharmacies and retail stores. ? Drink clear fluids, such as water, diluted fruit juice, and low-calorie sports drinks. You can also get fluids by sucking on ice chips. ? Drink enough fluid to keep your urine pale yellow. This will help you avoid dehydration. ? Eat small amounts of bland foods that are easy to digest as you are able. These foods include bananas, applesauce, rice, lean meats, toast, and crackers. ? Avoid spicy or fatty foods. ? Avoid foods and beverages that contain a lot of sugar or caffeine.  Do not drink alcohol if: ? Your health care provider tells you not to drink. ? You are pregnant, may be pregnant, or are planning to become pregnant.  If you drink alcohol: ? Limit how much you use to:  0-1 drink a day for women.  0-2 drinks a day for men. ? Be aware of how much alcohol is in your drink. In the [place] one drink equals one 12 oz bottle of beer (355 mL), one 5 oz glass of wine (148 mL), or one 1 oz glass of hard liquor (44 mL). General instructions   Wash your hands often and after each diarrhea episode. Use soap and water. If soap and water are not available, use hand sanitizer.  Make sure that all people in your household wash their hands well and often.  Rest as told by your health care provider.  Watch  your condition for any changes.  Take a warm bath to relieve any burning or pain from frequent diarrhea episodes.  Keep all follow-up visits as told by your health care provider. This is important. Contact a health care provider if:  You have a fever.  Your diarrhea gets worse or does not get better.  You have new symptoms.  You cannot drink fluid without vomiting.  You feel light-headed or dizzy.  You have a headache.  You have muscle cramps.  You have severe pain in the rectum. Get help right away if:  You have vomiting that does not go away.  You have chest pain.  You  feel very weak or you faint.  You have bloody or black stools, or stools that look like tar.  You have severe pain, cramping, or bloating in your abdomen, or pain that stays in one place.  You have trouble breathing or you are breathing very quickly.  Your heart is beating very quickly.  Your skin feels cold and clammy.  You feel confused.  You have a severe headache.  You have signs or symptoms of dehydration, such as: ? Dark urine, very little urine, or no urine. ? Cracked lips. ? Dry mouth. ? Sunken eyes. ? Sleepiness. ? Weakness. These symptoms may represent a serious problem that is an emergency. Do not wait to see if the symptoms will go away. Get medical help right away. Call your local emergency services (911 in the [place] Do not drive yourself to the hospital. Summary  Chronic diarrhea is a condition in which a person passes frequent loose and watery stools for 4 weeks or longer.  Diarrhea is a sign of an underlying problem.  Make sure you treat your diarrhea as told by your health care provider.  Drink enough fluid to keep your urine pale yellow. This will help you avoid dehydration.  Wash your hands often and after each diarrhea episode. If soap and water are not available, use hand sanitizer. This information is not intended to replace advice given to you by your health care provider. Make sure you discuss any questions you have with your health care provider. Document Revised: 01/28/2019 Document Reviewed: 01/28/2019 Elsevier Patient Education  [place].  Diarrhea, Adult Diarrhea is frequent loose and watery bowel movements. Diarrhea can make you feel weak and cause you to become dehydrated. Dehydration can make you tired and thirsty, cause you to have a dry mouth, and decrease how often you urinate. Diarrhea typically lasts 2-3 days. However, it can last longer if it is a sign of something more serious. It is important to treat your diarrhea as told by  your health care provider. Follow these instructions at home: Eating and drinking     Follow these recommendations as told by your health care provider:  Take an oral rehydration solution (ORS). This is an over-the-counter medicine that helps return your body to its normal balance of nutrients and water. It is found at pharmacies and retail stores.  Drink plenty of fluids, such as water, ice chips, diluted fruit juice, and low-calorie sports drinks. You can drink milk also, if desired.  Avoid drinking fluids that contain a lot of sugar or caffeine, such as energy drinks, sports drinks, and soda.  Eat bland, easy-to-digest foods in small amounts as you are able. These foods include bananas, applesauce, rice, lean meats, toast, and crackers.  Avoid alcohol.  Avoid spicy or fatty foods.  Medicines  Take over-the-counter and prescription medicines  only as told by your health care provider.  If you were prescribed an antibiotic medicine, take it as told by your health care provider. Do not stop using the antibiotic even if you start to feel better. General instructions   Wash your hands often using soap and water. If soap and water are not available, use a hand sanitizer. Others in the household should wash their hands as well. Hands should be washed: ? After using the toilet or changing a diaper. ? Before preparing, cooking, or serving food. ? While caring for a sick person or while visiting someone in a hospital.  Drink enough fluid to keep your urine pale yellow.  Rest at home while you recover.  Watch your condition for any changes.  Take a warm bath to relieve any burning or pain from frequent diarrhea episodes.  Keep all follow-up visits as told by your health care provider. This is important. Contact a health care provider if:  You have a fever.  Your diarrhea gets worse.  You have new symptoms.  You cannot keep fluids down.  You feel light-headed or  dizzy.  You have a headache.  You have muscle cramps. Get help right away if:  You have chest pain.  You feel extremely weak or you faint.  You have bloody or black stools or stools that look like tar.  You have severe pain, cramping, or bloating in your abdomen.  You have trouble breathing or you are breathing very quickly.  Your heart is beating very quickly.  Your skin feels cold and clammy.  You feel confused.  You have signs of dehydration, such as: ? Dark urine, very little urine, or no urine. ? Cracked lips. ? Dry mouth. ? Sunken eyes. ? Sleepiness. ? Weakness. Summary  Diarrhea is frequent loose and watery bowel movements. Diarrhea can make you feel weak and cause you to become dehydrated.  Drink enough fluids to keep your urine pale yellow.  Make sure that you wash your hands after using the toilet. If soap and water are not available, use hand sanitizer.  Contact a health care provider if your diarrhea gets worse or you have new symptoms.  Get help right away if you have signs of dehydration. This information is not intended to replace advice given to you by your health care provider. Make sure you discuss any questions you have with your health care provider. Document Revised: 12/18/2018 Document Reviewed: 01/05/2018 Elsevier Patient Education  [place].  Rectal Bleeding  Rectal bleeding is when blood passes out of the anus. People with rectal bleeding may notice bright red blood in their underwear or in the toilet after having a bowel movement. They may also have dark red or black stools. Rectal bleeding is usually a sign that something is wrong. Many things can cause rectal bleeding, including:  Hemorrhoids. These are blood vessels in the anus or rectum that are larger than normal.  Fistulas. These are abnormal passages in the rectum and anus.  Anal fissures. This is a tear in the anus.  Diverticulosis. This is a condition in which  pockets or sacs project from the bowel.  Proctitis and colitis. These are conditions in which the rectum, colon, or anus become inflamed.  Polyps. These are growths that can be cancerous (malignant) or non-cancerous (benign).  Part of the rectum sticking out from the anus (rectal prolapse).  Certain medicines.  Intestinal infections. Follow these instructions at home: Pay attention to any changes in your  symptoms. Take these actions to help lessen bleeding and discomfort:  Eat a diet that is high in fiber. This will keep your stool soft, making it easier to pass stools without straining. Ask your health care provider what foods and drinks are high in fiber.  Drink enough fluid to keep your urine clear or pale yellow. This also helps to keep your stool soft.  Try taking a warm bath. This may help soothe any pain in your rectum.  Keep all follow-up visits as told by your health care provider. This is important. Get help right away if:  You have new or increased rectal bleeding.  You have black or dark red stools.  You vomit blood or something that looks like coffee grounds.  You have pain or tenderness in your abdomen.  You have a fever.  You feel weak.  You feel nauseous.  You faint.  You have severe pain in your rectum.  You cannot have a bowel movement. This information is not intended to replace advice given to you by your health care provider. Make sure you discuss any questions you have with your health care provider. Document Revised: 03/24/2016 Document Reviewed: 09/27/2015 Elsevier Patient Education  2020 [employer].

## 2020-04-08 ENCOUNTER — Other Ambulatory Visit: Payer: Self-pay

## 2020-04-08 ENCOUNTER — Other Ambulatory Visit: Payer: Medicare Other

## 2020-04-08 DIAGNOSIS — Z20822 Contact with and (suspected) exposure to covid-19: Secondary | ICD-10-CM | POA: Diagnosis not present

## 2020-04-10 LAB — NOVEL CORONAVIRUS, NAA: SARS-CoV-2, NAA: NOT DETECTED

## 2020-04-10 LAB — SARS-COV-2, NAA 2 DAY TAT

## 2020-04-13 ENCOUNTER — Other Ambulatory Visit
Admission: RE | Admit: 2020-04-13 | Discharge: 2020-04-13 | Disposition: A | Discharge status: Home / Self Care | Payer: Medicare Other | Attending: Nurse Practitioner | Admitting: Nurse Practitioner

## 2020-04-13 DIAGNOSIS — K625 Hemorrhage of anus and rectum: Secondary | ICD-10-CM | POA: Diagnosis not present

## 2020-04-13 DIAGNOSIS — R197 Diarrhea, unspecified: Secondary | ICD-10-CM | POA: Insufficient documentation

## 2020-04-13 LAB — COMPREHENSIVE METABOLIC PANEL
ALT: 16 U/L (ref 0–44)
AST: 17 U/L (ref 15–41)
Albumin: 4.2 g/dL (ref 3.5–5.0)
Alkaline Phosphatase: 56 U/L (ref 38–126)
Anion gap: 9 (ref 5–15)
BUN: 14 mg/dL (ref 8–23)
CO2: 27 mmol/L (ref 22–32)
Calcium: 9.3 mg/dL (ref 8.9–10.3)
Chloride: 104 mmol/L (ref 98–111)
Creatinine, Ser: 1.21 mg/dL — ABNORMAL HIGH (ref 0.44–1.00)
GFR calc Af Amer: 50 mL/min — ABNORMAL LOW (ref >60)
GFR calc non Af Amer: 43 mL/min — ABNORMAL LOW (ref >60)
Glucose, Bld: 95 mg/dL (ref 70–99)
Potassium: 4 mmol/L (ref 3.5–5.1)
Sodium: 140 mmol/L (ref 135–145)
Total Bilirubin: 0.7 mg/dL (ref 0.3–1.2)
Total Protein: 7.4 g/dL (ref 6.5–8.1)

## 2020-04-13 LAB — CBC WITH DIFFERENTIAL/PLATELET
Abs Immature Granulocytes: 0.02 10*3/uL (ref 0.00–0.07)
Basophils Absolute: 0.1 10*3/uL (ref 0.0–0.1)
Basophils Relative: 1 %
Eosinophils Absolute: 0.2 10*3/uL (ref 0.0–0.5)
Eosinophils Relative: 4 %
HCT: 38.7 % (ref 36.0–46.0)
Hemoglobin: 12.4 g/dL (ref 12.0–15.0)
Immature Granulocytes: 1 %
Lymphocytes Relative: 35 %
Lymphs Abs: 1.3 10*3/uL (ref 0.7–4.0)
MCH: 27.5 pg (ref 26.0–34.0)
MCHC: 32 g/dL (ref 30.0–36.0)
MCV: 85.8 fL (ref 80.0–100.0)
Monocytes Absolute: 0.6 10*3/uL (ref 0.1–1.0)
Monocytes Relative: 16 %
Neutro Abs: 1.6 10*3/uL — ABNORMAL LOW (ref 1.7–7.7)
Neutrophils Relative %: 43 %
Platelets: 189 10*3/uL (ref 150–400)
RBC: 4.51 MIL/uL (ref 3.87–5.11)
RDW: 14.6 % (ref 11.5–15.5)
WBC: 3.8 10*3/uL — ABNORMAL LOW (ref 4.0–10.5)
nRBC: 0 % (ref 0.0–0.2)

## 2020-05-08 ENCOUNTER — Ambulatory Visit (INDEPENDENT_AMBULATORY_CARE_PROVIDER_SITE_OTHER): Payer: Medicare Other | Admitting: Family Medicine

## 2020-05-08 ENCOUNTER — Encounter: Payer: Self-pay | Admitting: Family Medicine

## 2020-05-08 ENCOUNTER — Other Ambulatory Visit: Payer: Self-pay

## 2020-05-08 VITALS — BP 130/80 | HR 71 | Temp 98.2°F | Ht 67.0 in | Wt 184.4 lb

## 2020-05-08 DIAGNOSIS — K047 Periapical abscess without sinus: Secondary | ICD-10-CM | POA: Diagnosis not present

## 2020-05-08 DIAGNOSIS — K429 Umbilical hernia without obstruction or gangrene: Secondary | ICD-10-CM | POA: Diagnosis not present

## 2020-05-08 DIAGNOSIS — L309 Dermatitis, unspecified: Secondary | ICD-10-CM | POA: Diagnosis not present

## 2020-05-08 DIAGNOSIS — E785 Hyperlipidemia, unspecified: Secondary | ICD-10-CM

## 2020-05-08 DIAGNOSIS — I10 Essential (primary) hypertension: Secondary | ICD-10-CM | POA: Diagnosis not present

## 2020-05-08 DIAGNOSIS — K219 Gastro-esophageal reflux disease without esophagitis: Secondary | ICD-10-CM

## 2020-05-08 LAB — LDL CHOLESTEROL, DIRECT: Direct LDL: 128 mg/dL

## 2020-05-08 MED ORDER — FLUCONAZOLE 150 MG PO TABS: 150.0000 mg | ORAL_TABLET | Freq: Once | ORAL | 0 refills | Status: AC

## 2020-05-08 MED ORDER — CLOBETASOL PROPIONATE 0.05 % EX CREA
TOPICAL_CREAM | Freq: Two times a day (BID) | CUTANEOUS | 1 refills | Status: DC
Start: 2020-05-08 — End: 2021-11-09

## 2020-05-08 NOTE — Progress Notes (Signed)
[name], MD Phone: [phone]  Lindsey Foster is a 74 y.o. female who presents today for follow-up.  Hypertension: Has been running 120-135/70s at home.  Taking amlodipine 2.5 mg once daily.  No chest pain, shortness of breath, or edema.  GERD/abdominal pain: Patient notes she was recently seen for this.  She restarted Pepcid and notes that has helped.  No reflux symptoms.  She did have some abdominal discomfort in August.  Had some diarrhea with this.  No vomiting.  Had some nausea.  She felt constipated and felt as though she was having to force out her stools.  Feels as though her bowel movements are incomplete at times.  She did have a small amount of blood on one occasion.  She has an appointment with GI scheduled in November.  Stools have improved since August.  Symptoms overall are resolved with Pepcid.  Dental infection: Patient notes she saw her dentist recently and they prescribed clindamycin because she has a history of yeast infections with amoxicillin.  She read the side effect profile of the clindamycin and notes she is hesitant to take it.  Her dentist did refer her to an endodontist.  Umbilical hernia: Patient has noted that for many years her bellybutton seems to poke out.  Notes it is occasionally a little more sensitive than baseline.  Social History   Tobacco Use  Smoking Status Former Smoker  Smokeless Tobacco Never Used     ROS see history of present illness  Objective  Physical Exam Vitals:   05/08/20 1052  BP: 130/80  Pulse: 71  Temp: 98.2 F (36.8 C)  SpO2: 96%    BP Readings from Last 3 Encounters:  05/08/20 130/80  04/07/20 128/67  03/20/20 124/82   Wt Readings from Last 3 Encounters:  05/08/20 184 lb 6.4 oz (83.6 kg)  04/07/20 180 lb (81.6 kg)  03/20/20 176 lb (79.8 kg)    Physical Exam Constitutional:      General: She is not in acute distress.    Appearance: She is not diaphoretic.  Cardiovascular:     Rate and  Rhythm: Normal rate and regular rhythm.     Heart sounds: Normal heart sounds.  Pulmonary:     Effort: Pulmonary effort is normal.     Breath sounds: Normal breath sounds.  Abdominal:     General: Bowel sounds are normal. There is no distension.     Palpations: Abdomen is soft.     Tenderness: There is no abdominal tenderness. There is no guarding or rebound.     Hernia: A hernia (Likely umbilical hernia) is present.  Skin:    General: Skin is warm and dry.  Neurological:     Mental Status: She is alert.      Assessment/Plan: Please see individual problem list.  Essential hypertension Adequately controlled.  Continue amlodipine 2.5 mg once daily.  GERD (gastroesophageal reflux disease) Symptoms resolved with Pepcid.  She will see GI as planned.  She can continue Pepcid as needed.  Hyperlipidemia Check LDL at patient request.  Dental infection Patient has antibiotics to take from her dentist.  I discussed that the clindamycin could result in a yeast infection as well and encouraged her to contact her dentist to see if they would prescribe the amoxicillin that was initially discussed given the risk of diarrhea and C. difficile with clindamycin.  I did prescribe Diflucan 150 mg tablet to take once by mouth if she develops a yeast infection.  Eczema Clobetasol refill  to apply topically 2 times daily for up to 2 weeks and then just on weekdays as needed.   Orders Placed This Encounter  Procedures  . Direct LDL    Meds ordered this encounter  Medications  . clobetasol cream (TEMOVATE) 0.05 %    Sig: Apply topically 2 (two) times daily. For 2 weeks, then just on week days as needed    Dispense:  60 g    Refill:  1  . fluconazole (DIFLUCAN) 150 MG tablet    Sig: Take 1 tablet (150 mg total) by mouth once for 1 dose.    Dispense:  1 tablet    Refill:  0    Shalice was seen today for follow-up.  Diagnoses and all orders for this visit:  Hyperlipidemia, unspecified  hyperlipidemia type -     Direct LDL  Essential hypertension  Gastroesophageal reflux disease, unspecified whether esophagitis present  Dental infection  Eczema, unspecified type  Other orders -     clobetasol cream (TEMOVATE) 0.05 %; Apply topically 2 (two) times daily. For 2 weeks, then just on week days as needed -     fluconazole (DIFLUCAN) 150 MG tablet; Take 1 tablet (150 mg total) by mouth once for 1 dose.     This visit occurred during the SARS-CoV-2 public health emergency.  Safety protocols were in place, including screening questions prior to the visit, additional usage of staff PPE, and extensive cleaning of exam room while observing appropriate contact time as indicated for disinfecting solutions.    [name], MD [place]

## 2020-05-08 NOTE — Patient Instructions (Signed)
Nice to see you. You can take the Diflucan if you get a yeast infection with your antibiotics for your dental infection. I have referred you to general surgery.  Someone should contact you regarding that. Please keep your appointment with GI.

## 2020-05-09 DIAGNOSIS — K429 Umbilical hernia without obstruction or gangrene: Secondary | ICD-10-CM | POA: Insufficient documentation

## 2020-05-09 DIAGNOSIS — K047 Periapical abscess without sinus: Secondary | ICD-10-CM | POA: Insufficient documentation

## 2020-05-09 NOTE — Assessment & Plan Note (Signed)
Adequately controlled.  Continue amlodipine 2.5 mg once daily.

## 2020-05-09 NOTE — Assessment & Plan Note (Signed)
Clobetasol refill to apply topically 2 times daily for up to 2 weeks and then just on weekdays as needed.

## 2020-05-09 NOTE — Assessment & Plan Note (Signed)
Check LDL at patient request.

## 2020-05-09 NOTE — Assessment & Plan Note (Signed)
Likely umbilical hernia.  Will refer to general surgery to see if surgical intervention is necessary.  Given hernia return precautions.

## 2020-05-09 NOTE — Assessment & Plan Note (Signed)
Patient has antibiotics to take from her dentist.  I discussed that the clindamycin could result in a yeast infection as well and encouraged her to contact her dentist to see if they would prescribe the amoxicillin that was initially discussed given the risk of diarrhea and C. difficile with clindamycin.  I did prescribe Diflucan 150 mg tablet to take once by mouth if she develops a yeast infection.

## 2020-05-09 NOTE — Assessment & Plan Note (Signed)
Symptoms resolved with Pepcid.  She will see GI as planned.  She can continue Pepcid as needed.

## 2020-05-14 DIAGNOSIS — M6208 Separation of muscle (nontraumatic), other site: Secondary | ICD-10-CM | POA: Diagnosis not present

## 2020-05-14 DIAGNOSIS — K429 Umbilical hernia without obstruction or gangrene: Secondary | ICD-10-CM | POA: Diagnosis not present

## 2020-06-18 ENCOUNTER — Ambulatory Visit (INDEPENDENT_AMBULATORY_CARE_PROVIDER_SITE_OTHER): Payer: Medicare Other | Admitting: Gastroenterology

## 2020-06-18 ENCOUNTER — Encounter: Payer: Self-pay | Admitting: Gastroenterology

## 2020-06-18 VITALS — BP 157/74 | HR 80 | Temp 98.8°F | Ht 67.0 in | Wt 184.0 lb

## 2020-06-18 DIAGNOSIS — K625 Hemorrhage of anus and rectum: Secondary | ICD-10-CM

## 2020-06-18 DIAGNOSIS — R140 Abdominal distension (gaseous): Secondary | ICD-10-CM

## 2020-06-18 NOTE — Progress Notes (Signed)
 Lindsey R Vanga, MD [address]  [address]  [place], [place] [address]  Main: [phone]  Fax: [phone]    Gastroenterology Consultation  Referring Provider:     Mills, Kimberly A, NP Primary Care Physician:  Sonnenberg, Eric G, MD Primary Gastroenterologist:  Dr. Rohini R Foster Reason for Consultation:     Rectal bleeding, abdominal bloating        HPI:   Lindsey Foster is a 74 y.o. female referred by Dr. Sonnenberg, Eric G, MD  for consultation & management of rectal bleeding.  Patient reported that in August, she had 2 days of greasy food followed by 2 days of constipation then had a bowel movement that was hard associated with straining.  She noticed bright red blood per rectum at that time.  Since then, she did not have any recurrent episodes.  She does report perianal itching after bowel movement which is intermittent, occurs when she has sensation of incomplete emptying.  She does report abdominal bloating.  She denies any upper GI symptoms.  Her weight has been stable.  No evidence of anemia.  Patient was also evaluated by the surgeon, Dr. Byrnett for umbilical hernia as well as diastases recti and no surgical intervention was recommended at this time  Patient is very active, independent of ADLs, states that she would prefer to live 100 years by staying healthy NSAIDs: None  Antiplts/Anticoagulants/Anti thrombotics: None  GI Procedures: Colonoscopy in 2017 by Dr. Skulskie Diverticulosis in the sigmoid colon, in the descending colon, in the transverse colon and in the ascending colon. The distal rectum and anal verge are normal on the retroflexion view.   Past Medical History:  Diagnosis Date  . Allergic rhinitis   . Anxiety    panic attacks in hot rooms  . Arthritis   . Blood in stool   . Breast cancer (HCC) 2006   radiation - Rt  . Chickenpox   . CKD (chronic kidney disease)   . Colon polyps   . Diverticulosis   . GERD (gastroesophageal reflux  disease)   . Heart murmur   . Hyperlipidemia   . Hypertension   . Hypothyroidism   . Multiple thyroid nodules   . Osteopenia   . Personal history of radiation therapy   . Urinary incontinence   . UTI (lower urinary tract infection)   . Vitamin D deficiency disease     Past Surgical History:  Procedure Laterality Date  . BLADDER SURGERY    . BREAST EXCISIONAL BIOPSY Right 2006   positive  . 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 CESAREAN SECTION  1985  . COLONOSCOPY WITH PROPOFOL N/A 09/28/2015   Procedure: COLONOSCOPY WITH PROPOFOL;  Surgeon: Martin U Skulskie, MD;  Location: [facility] ENDOSCOPY;  Service: Endoscopy;  Laterality: N/A;  . DILATION AND CURETTAGE OF UTERUS  2012  . Thyroid nodule ablation  2003  . Uterine polyp removal  2009     Current Outpatient Medications:  .  amLODipine (NORVASC) 2.5 MG tablet, TAKE 1 TABLET(2.5 MG) BY MOUTH DAILY, Disp: 90 tablet, Rfl: 1 .  Calcium Citrate-Vitamin D3 1000-400 LIQD, Take 1 tablet by mouth daily. Reported on 02/04/2016, Disp: , Rfl:  .  clobetasol cream (TEMOVATE) 0.05 %, Apply topically 2 (two) times daily. For 2 weeks, then just on week days as needed, Disp: 60 g, Rfl: 1 .  famotidine (PEPCID) 20 MG tablet, TAKE 1 TABLET(20 MG) BY MOUTH TWICE DAILY BEFORE A MEAL, Disp: 180   tablet, Rfl: 1 .  fluticasone (FLONASE SENSIMIST) 27.5 MCG/SPRAY nasal spray, Place 1 spray into the nose daily as needed. , Disp: , Rfl:  .  Garlic 100 MG TABS, Take 1 tablet by mouth daily. , Disp: , Rfl:  .  ketotifen (ZADITOR) 0.025 % ophthalmic solution, 1 drop 2 (two) times daily., Disp: , Rfl:  .  loratadine (CLARITIN) 10 MG tablet, Take 1 tablet (10 mg total) by mouth daily., Disp: 30 tablet, Rfl: 11 .  mometasone (ELOCON) 0.1 % lotion, Apply topically daily., Disp: , Rfl:  .  clindamycin (CLEOCIN) 300 MG capsule, Take 300 mg by mouth 3 (three) times daily. (Patient not taking: Reported on 06/18/2020), Disp: , Rfl:    Family History  Problem Relation Age of  Onset  . Heart disease Other        Parent  . Kidney disease Other        Parent  . Uterine cancer Maternal Aunt   . Kidney disease Mother   . Angina Father   . Heart attack Father   . Multiple myeloma Brother   . Breast cancer Neg Hx      Social History   Tobacco Use  . Smoking status: Former Smoker  . Smokeless tobacco: Never Used  Vaping Use  . Vaping Use: Never used  Substance Use Topics  . Alcohol use: No    Alcohol/week: 0.0 standard drinks  . Drug use: No    Allergies as of 06/18/2020 - Review Complete 06/18/2020  Allergen Reaction Noted  . Latex Rash 06/30/2015    Review of Systems:    All systems reviewed and negative except where noted in HPI.   Physical Exam:  BP (!) 157/74 (BP Location: Left Arm, Patient Position: Sitting, Cuff Size: Normal)   Pulse 80   Temp 98.8 F (37.1 C) (Oral)   Ht 5' 7" (1.702 m)   Wt 184 lb (83.5 kg)   BMI 28.82 kg/m  No LMP recorded. Patient is postmenopausal.  General:   Alert,  Well-developed, well-nourished, pleasant and cooperative in NAD Head:  Normocephalic and atraumatic. Eyes:  Sclera clear, no icterus.   Conjunctiva pink. Ears:  Normal auditory acuity. Nose:  No deformity, discharge, or lesions. Mouth:  No deformity or lesions,oropharynx pink & moist. Neck:  Supple; no masses or thyromegaly. Lungs:  Respirations even and unlabored.  Clear throughout to auscultation.   No wheezes, crackles, or rhonchi. No acute distress. Heart:  Regular rate and rhythm; no murmurs, clicks, rubs, or gallops. Abdomen:  Normal bowel sounds. Soft, non-tender and mildly distended, tympanic to percussion without masses, hepatosplenomegaly or hernias noted.  No guarding or rebound tenderness.   Rectal: Not performed Msk:  Symmetrical without gross deformities. Good, equal movement & strength bilaterally. Pulses:  Normal pulses noted. Extremities:  No clubbing or edema.  No cyanosis. Neurologic:  Alert and oriented x3;  grossly normal  neurologically. Skin:  Intact without significant lesions or rashes. No jaundice. Lymph Nodes:  No significant cervical adenopathy. Psych:  Alert and cooperative. Normal mood and affect.  Imaging Studies: No abdominal imaging  Assessment and Plan:   Lindsey Foster is a 74 y.o. African-American female with hypertension, hypothyroidism is seen in consultation for an isolated episode of bright red blood per rectum  Bright red blood per rectum: With no other alarm symptoms or red flags Hemoglobin is normal Most likely hemorrhoidal bleed in setting of hard bowel movement Patient had 2 colonoscopies within last 10 years which were   normal I do not recommend colonoscopy at this time  Perianal itching Advised to try witch hazel or Tucks pads  Abdominal bloating Patient does report incomplete emptying I have discussed with her about regular intake of stool softener such as MiraLAX daily along with high-fiber diet as well as soluble fiber if needed She will contact my office in 10 to 14 days if the bloating is persistent, I will order H. pylori breath test at that time   Follow up as needed   Cephas Darby, MD

## 2020-06-18 NOTE — Patient Instructions (Signed)
1. If miralax does not work we are going to check for H pylori infection.

## 2020-06-24 NOTE — Progress Notes (Signed)
[name] [address] [place] [address] Phone: [phone] Subjective:   Lindsey Foster, am serving as a scribe for Dr. Antoine Primas. This visit occurred during the SARS-CoV-2 public health emergency.  Safety protocols were in place, including screening questions prior to the visit, additional usage of staff PPE, and extensive cleaning of exam room while observing appropriate contact time as indicated for disinfecting solutions.    I'm seeing this patient by the request  of:  [name], MD  CC: Foot and knee pain follow-up  [id number]   03/20/2020        Patient's knee exam does show that patient does have some arthritic changes still noted.  Wearing custom brace.  Did not feel that the steroid injection has been helpful but we discussed different injections including viscosupplementation and the possibility of even PRP.  Patient will read on these and decide if that is something that is necessary at this time.  Discussed which activities to doing which wants to avoid.  Increase activity slowly.  Follow-up again in 4 to 8 weeks.       Update 06/26/2020 Lindsey Foster is a 74 y.o. female coming in with complaint of left foot pain and left knee pain. Patient states that some days are better than others. The knee when wearing the brace is better but if wearing more than 2-3 hours walking it get sore on the lateral knee. Does have a lot of stiffness especially when sitting to standing. Patient biggest complaint is the stiffness. The only issue with Her Left foot she feels between the great toe and second toe it feels "tight" like its pulling and will be itchy. Patient states no significant increase in the pain and just more the stiffness still noted.      Past Medical History:  Diagnosis Date  . Allergic rhinitis   . Anxiety    panic attacks in hot rooms  . Arthritis   . Blood in stool   . Breast cancer (HCC) 2006    radiation - Rt  . Chickenpox   . CKD (chronic kidney disease)   . Colon polyps   . Diverticulosis   . GERD (gastroesophageal reflux disease)   . Heart murmur   . Hyperlipidemia   . Hypertension   . Hypothyroidism   . Multiple thyroid nodules   . Osteopenia   . Personal history of radiation therapy   . Urinary incontinence   . UTI (lower urinary tract infection)   . Vitamin D deficiency disease    Past Surgical History:  Procedure Laterality Date  . BLADDER SURGERY    . BREAST EXCISIONAL BIOPSY Right 2006   positive  . 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 CESAREAN SECTION  1985  . COLONOSCOPY WITH PROPOFOL N/A 09/28/2015   Procedure: COLONOSCOPY WITH PROPOFOL;  Surgeon: Christena Deem, MD;  Location: [facility] ENDOSCOPY;  Service: Endoscopy;  Laterality: N/A;  . DILATION AND CURETTAGE OF UTERUS  2012  . Thyroid nodule ablation  2003  . Uterine polyp removal  2009    Social History   Socioeconomic History  . Marital status: Widowed    Spouse name: Not on file  . Number of children: Not on file  . Years of education: Not on file  . Highest education level: Not on file  Occupational History  . Not on file  Tobacco Use  . Smoking status: Former Games developer  . Smokeless tobacco: Never  Used  Vaping Use  . Vaping Use: Never used  Substance and Sexual Activity  . Alcohol use: No    Alcohol/week: 0.0 standard drinks  . Drug use: No  . Sexual activity: Not on file  Other Topics Concern  . Not on file  Social History Narrative  . Not on file   Social Determinants of Health   Financial Resource Strain:   . Difficulty of Paying Living Expenses: Not on file  Food Insecurity:   . Worried About Charity fundraiser in the Last Year: Not on file  . Ran Out of Food in the Last Year: Not on file  Transportation Needs:   . Lack of Transportation (Medical): Not on file  . Lack of Transportation (Non-Medical): Not on file  Physical Activity:   . Days of Exercise per Week: Not on file    . Minutes of Exercise per Session: Not on file  Stress:   . Feeling of Stress : Not on file  Social Connections:   . Frequency of Communication with Friends and Family: Not on file  . Frequency of Social Gatherings with Friends and Family: Not on file  . Attends Religious Services: Not on file  . Active Member of Clubs or Organizations: Not on file  . Attends Archivist Meetings: Not on file  . Marital Status: Not on file   Allergies  Allergen Reactions  . Latex Rash   Family History  Problem Relation Age of Onset  . Heart disease Other        Parent  . Kidney disease Other        Parent  . Uterine cancer Maternal Aunt   . Kidney disease Mother   . Angina Father   . Heart attack Father   . Multiple myeloma Brother   . Breast cancer Neg Hx      Current Outpatient Medications (Cardiovascular):  .  amLODipine (NORVASC) 2.5 MG tablet, TAKE 1 TABLET(2.5 MG) BY MOUTH DAILY  Current Outpatient Medications (Respiratory):  .  fluticasone (FLONASE SENSIMIST) 27.5 MCG/SPRAY nasal spray, Place 1 spray into the nose daily as needed.  .  loratadine (CLARITIN) 10 MG tablet, Take 1 tablet (10 mg total) by mouth daily.    Current Outpatient Medications (Other):  Marland Kitchen  Calcium Citrate-Vitamin D3 1000-400 LIQD, Take 1 tablet by mouth daily. Reported on 02/04/2016 .  clobetasol cream (TEMOVATE) 0.05 %, Apply topically 2 (two) times daily. For 2 weeks, then just on week days as needed .  famotidine (PEPCID) 20 MG tablet, TAKE 1 TABLET(20 MG) BY MOUTH TWICE DAILY BEFORE A MEAL .  Garlic 188 MG TABS, Take 1 tablet by mouth daily.  Marland Kitchen  ketotifen (ZADITOR) 0.025 % ophthalmic solution, 1 drop 2 (two) times daily. .  mometasone (ELOCON) 0.1 % lotion, Apply topically daily. .  clindamycin (CLEOCIN) 300 MG capsule, Take 300 mg by mouth 3 (three) times daily. (Patient not taking: Reported on 06/18/2020)   Reviewed prior external information including notes and imaging from  primary care  provider As well as notes that were available from care everywhere and other healthcare systems.  Past medical history, social, surgical and family history all reviewed in electronic medical record.  No pertanent information unless stated regarding to the chief complaint.   Review of Systems:  No headache, visual changes, nausea, vomiting, diarrhea, constipation, dizziness, abdominal pain, skin rash, fevers, chills, night sweats, weight loss, swollen lymph nodes, body aches, joint swelling, chest pain, shortness of breath,  mood changes. POSITIVE muscle aches  Objective  Blood pressure 136/60, pulse 65, height $RemoveBe'5\' 7"'[id number]$  (1.702 m), weight 185 lb (83.9 kg), SpO2 98 %.   General: No apparent distress alert and oriented x3 mood and affect normal, dressed appropriately.  HEENT: Pupils equal, extraocular movements intact  Respiratory: Patient's speak in full sentences and does not appear short of breath  Cardiovascular: No lower extremity edema, non tender, no erythema  Significant arthritic changes of multiple joints. Antalgic gait Knee: Left valgus deformity noted. Large thigh to calf ratio.  Patient is wearing the custom brace Tender to palpation over medial and PF joint line.  ROM full in flexion and extension and lower leg rotation.  Maybe lacks the last 5 degrees of extension instability with valgus force.  painful patellar compression. Patellar glide with moderate crepitus. Patellar and quadriceps tendons unremarkable. Hamstring and quadriceps strength is normal. Contralateral knee shows mild arthritic changes as well.  Left foot exam shows the patient has severe breakdown of the transverse arch.  Improvement in range of motion of the first MTP joint.  Patient mild positive squeeze test.    Impression and Recommendations:     The above documentation has been reviewed and is accurate and complete Lyndal Pulley, DO

## 2020-06-26 ENCOUNTER — Ambulatory Visit (INDEPENDENT_AMBULATORY_CARE_PROVIDER_SITE_OTHER): Payer: Medicare Other | Admitting: Family Medicine

## 2020-06-26 ENCOUNTER — Other Ambulatory Visit: Payer: Self-pay

## 2020-06-26 ENCOUNTER — Encounter: Payer: Self-pay | Admitting: Family Medicine

## 2020-06-26 DIAGNOSIS — M19072 Primary osteoarthritis, left ankle and foot: Secondary | ICD-10-CM

## 2020-06-26 DIAGNOSIS — M1712 Unilateral primary osteoarthritis, left knee: Secondary | ICD-10-CM

## 2020-06-26 NOTE — Assessment & Plan Note (Signed)
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  Patient is going to release shoes differently.  We will continue conservative therapy.  Continue the different shoes and follow-up with me again for 12 weeks

## 2020-06-26 NOTE — Assessment & Plan Note (Signed)
Patient continues to want to do conservative therapy.  Declined any type of injection.  Last injection was in June 2021.  Could be a candidate for viscosupplementation but still does not want to do anything different.  Wants to avoid any surgical intervention.  Follow-up with me again in 3 months

## 2020-06-26 NOTE — Patient Instructions (Addendum)
Good to see you  Do not lace last eyelet on shoe Keep doing exercises We will keep an eye on the knee  See me again in 3 months

## 2020-07-07 DIAGNOSIS — N1831 Chronic kidney disease, stage 3a: Secondary | ICD-10-CM | POA: Diagnosis not present

## 2020-07-13 DIAGNOSIS — I10 Essential (primary) hypertension: Secondary | ICD-10-CM | POA: Diagnosis not present

## 2020-07-13 DIAGNOSIS — N2581 Secondary hyperparathyroidism of renal origin: Secondary | ICD-10-CM | POA: Diagnosis not present

## 2020-07-13 DIAGNOSIS — N1832 Chronic kidney disease, stage 3b: Secondary | ICD-10-CM | POA: Diagnosis not present

## 2020-07-20 ENCOUNTER — Telehealth: Payer: Self-pay | Admitting: *Deleted

## 2020-07-20 ENCOUNTER — Other Ambulatory Visit: Payer: Self-pay | Admitting: Hematology and Oncology

## 2020-07-20 DIAGNOSIS — D0511 Intraductal carcinoma in situ of right breast: Secondary | ICD-10-CM

## 2020-07-20 NOTE — Telephone Encounter (Signed)
Patient called asking for an order for her mammogram that she was notified is due. She requests a return call when order is in

## 2020-07-20 NOTE — Telephone Encounter (Signed)
  Please call patient.  Order placed for mammogram 07/28/2020.  M

## 2020-07-22 ENCOUNTER — Other Ambulatory Visit: Payer: Medicare Other

## 2020-07-22 DIAGNOSIS — Z20822 Contact with and (suspected) exposure to covid-19: Secondary | ICD-10-CM | POA: Diagnosis not present

## 2020-07-23 ENCOUNTER — Ambulatory Visit: Payer: Medicare Other

## 2020-07-23 LAB — NOVEL CORONAVIRUS, NAA: SARS-CoV-2, NAA: NOT DETECTED

## 2020-07-23 LAB — SARS-COV-2, NAA 2 DAY TAT

## 2020-07-27 ENCOUNTER — Ambulatory Visit (INDEPENDENT_AMBULATORY_CARE_PROVIDER_SITE_OTHER): Payer: Medicare Other

## 2020-07-27 ENCOUNTER — Other Ambulatory Visit: Payer: Self-pay

## 2020-07-27 DIAGNOSIS — Z23 Encounter for immunization: Secondary | ICD-10-CM

## 2020-07-29 ENCOUNTER — Other Ambulatory Visit: Payer: Self-pay

## 2020-07-29 ENCOUNTER — Ambulatory Visit
Admission: RE | Admit: 2020-07-29 | Discharge: 2020-07-29 | Disposition: A | Discharge status: Home / Self Care | Payer: Medicare Other | Source: Ambulatory Visit | Attending: Hematology and Oncology | Admitting: Hematology and Oncology

## 2020-07-29 DIAGNOSIS — Z853 Personal history of malignant neoplasm of breast: Secondary | ICD-10-CM | POA: Diagnosis not present

## 2020-07-29 DIAGNOSIS — Z1231 Encounter for screening mammogram for malignant neoplasm of breast: Secondary | ICD-10-CM | POA: Diagnosis not present

## 2020-07-29 DIAGNOSIS — D0511 Intraductal carcinoma in situ of right breast: Secondary | ICD-10-CM

## 2020-08-05 ENCOUNTER — Other Ambulatory Visit: Payer: Self-pay

## 2020-08-05 ENCOUNTER — Telehealth: Payer: Self-pay | Admitting: Family Medicine

## 2020-08-05 NOTE — Telephone Encounter (Signed)
Letter sent via mychart to patient. Patient notified.

## 2020-08-05 NOTE — Telephone Encounter (Signed)
I am ok with that.  State secondary to health concerns, patient will not be able to travel for the significant future and request she be refunded

## 2020-08-05 NOTE — Telephone Encounter (Signed)
Patient called asking if Dr Tamala Julian would be able to write her a letter getting her out of the trip that was planned to [place]. She said that they began planning the trip two years ago but at this point she does not believe that she will be able to handle it. She mentioned that she is not able to sit for long periods of time and has trouble walking as well.  She has requested a refund for the trip but they are asking for a doctors note.  Would Dr Tamala Julian be able to do this for her?  She is going to fax over some other information as well.  - If able, please e-mail to [email or web address]

## 2020-08-12 NOTE — Progress Notes (Signed)
[facility]  [address], [address] [place], [place] [address] Phone: [phone]  Fax: [phone]   Clinic Day:  08/13/2020  Referring physician: [name], MD  Chief Complaint: Lindsey Foster is a 74 y.o. female with a history of DCIS in the right breast who is seen for 1 year assessment.  HPI: The patient was last seen in the medical oncology clinic on 08/01/2019 by Beckey Rutter, NP. At that time, patient felt well and there was no evidence of recurrent or progressive disease. Hematocrit was 40.4, hemoglobin 13.0, platelets 199,000, WBC 3,300 (ANC 1,400). Creatinine was 1.21 (CrCl 51 ml/min). CA27.29 was 42.5. Vitamin D, 25-Hydroxy was 48.22.  Bone density on 09/05/2019 revealed osteoporosis with a T score of -2.9 at the lumbar spine.  The patient saw Denice Paradise, NP via telemedicine on 04/07/2020 for diarrhea after eating a high fat meal and one episode of blood in the stool. She stated that there was about one tablespoon of blood. She thought that she might have an umbilical hernia. She was referred to GI.  The patient saw Dr. Bary Castilla on 05/14/2020. She reported an umbilical hernia that had been present for 30 years. They discussed plans for observation at this time.  The patient saw Dr. Marius Ditch on 06/18/2020. She had not had anymore episodes of rectal bleeding. Bleeding was felt to be due to hemorrhoids in the setting of a hard bowel movement. Recommended daily stool softeners. Colonoscopy was not recommended.  The patient saw Dr. Holley Raring on 07/13/2020. Her chronic kidney disease was fluctuating a bit. Most recent EGFR was 42 ml/min. Her blood pressure was elevated at 165/88 but she noted that she ate a lot of salt over Thanksgiving. Follow up was planned for 4 months.  Bilateral screening mammogram on 07/29/2020 revealed no evidence of malignancy.  She performs monthly breast exams and has no concerns.  During the interim, she has been  "great." She has been less active since the COVID-19 pandemic but is planning to start walking and doing water aerobics in January. 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   The patient reports that her whole body itches at night. The itching is the worst on her back and where she had her lumpectomy. She feels like the bone in her big toe is itchy; topical lotions do not help. She declines allergy testing.   Past Medical History:  Diagnosis Date  . Allergic rhinitis   . Anxiety    panic attacks in hot rooms  . Arthritis   . Blood in stool   . Breast cancer ([place]) 2006   radiation - Rt  . Chickenpox   . CKD (chronic kidney disease)   . Colon polyps   . Diverticulosis   . GERD (gastroesophageal reflux disease)   . Heart murmur   . Hyperlipidemia   . Hypertension   . Hypothyroidism   . Multiple thyroid nodules   . Osteopenia   . Personal history of radiation therapy   . Urinary incontinence   . UTI (lower urinary tract infection)   . Vitamin D deficiency disease     Past Surgical History:  Procedure Laterality Date  . BLADDER SURGERY    . BREAST EXCISIONAL BIOPSY Right 2006   positive  . 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 CESAREAN SECTION  1985  . COLONOSCOPY WITH PROPOFOL N/A 09/28/2015   Procedure: COLONOSCOPY WITH PROPOFOL;  Surgeon: Billie Ruddy  Gustavo Lah, MD;  Location: [facility] ENDOSCOPY;  Service: Endoscopy;  Laterality: N/A;  . DILATION AND CURETTAGE OF UTERUS  2012  . Thyroid nodule ablation  2003  . Uterine polyp removal  2009     Family History  Problem Relation Age of Onset  . Heart disease Other        Parent  . Kidney disease Other        Parent  . Uterine cancer Maternal Aunt   . Kidney disease Mother   . Angina Father   . Heart attack Father   . Multiple myeloma Brother   . Breast cancer Neg Hx     Social History:  reports that she has quit smoking. She has never used smokeless tobacco. She reports that she does not  drink alcohol and does not use drugs.The patient is alone today.  Allergies:  Allergies  Allergen Reactions  . Latex Rash    Current Medications: Current Outpatient Medications  Medication Sig Dispense Refill  . amLODipine (NORVASC) 2.5 MG tablet TAKE 1 TABLET(2.5 MG) BY MOUTH DAILY 90 tablet 1  . clobetasol cream (TEMOVATE) 0.05 % Apply topically 2 (two) times daily. For 2 weeks, then just on week days as needed 60 g 1  . famotidine (PEPCID) 20 MG tablet TAKE 1 TABLET(20 MG) BY MOUTH TWICE DAILY BEFORE A MEAL 180 tablet 1  . fluticasone (FLONASE SENSIMIST) 27.5 MCG/SPRAY nasal spray Place 1 spray into the nose daily as needed.     . Garlic 465 MG TABS Take 1 tablet by mouth daily.     Marland Kitchen ketotifen (ZADITOR) 0.025 % ophthalmic solution 1 drop 2 (two) times daily.    Marland Kitchen loratadine (CLARITIN) 10 MG tablet Take 1 tablet (10 mg total) by mouth daily. 30 tablet 11  . mometasone (ELOCON) 0.1 % lotion Apply topically daily.    . Calcium Citrate-Vitamin D3 1000-400 LIQD Take 1 tablet by mouth daily. Reported on 02/04/2016 (Patient not taking: Reported on 08/13/2020)    . clindamycin (CLEOCIN) 300 MG capsule Take 300 mg by mouth 3 (three) times daily. (Patient not taking: No sig reported)     No current facility-administered medications for this visit.    Review of Systems  Constitutional: Negative for chills, diaphoresis, fever, malaise/fatigue and weight loss (up 2 lbs).       Feels "great."  HENT: Negative for congestion, ear discharge, ear pain, hearing loss, nosebleeds, sinus pain, sore throat and tinnitus.   Eyes: Negative for blurred vision.  Respiratory: Negative for cough, hemoptysis, sputum production and shortness of breath.   Cardiovascular: Negative for chest pain, palpitations and leg swelling.  Gastrointestinal: Negative for abdominal pain, blood in stool, constipation, diarrhea, heartburn, melena, nausea and vomiting.  Genitourinary: Negative for dysuria, frequency, hematuria and  urgency.  Musculoskeletal: Negative for back pain, joint pain (arthritis in left knee), myalgias and neck pain.  Skin: Positive for itching (whole body at night). Negative for rash.  Neurological: Negative for dizziness, tingling, sensory change, weakness and headaches.  Endo/Heme/Allergies: Does not bruise/bleed easily.  Psychiatric/Behavioral: Negative for depression and memory loss. The patient is not nervous/anxious and does not have insomnia.   All other systems reviewed and are negative.  Performance status (ECOG): 0  Vitals Blood pressure 140/76, pulse 60, temperature (!) 97.1 F (36.2 C), temperature source Tympanic, resp. rate 18, weight 180 lb 12.4 oz (82 kg), SpO2 98 %.   Physical Exam Vitals and nursing note reviewed.  Constitutional:      General: She  is not in acute distress.    Appearance: She is not diaphoretic.  HENT:     Head: Normocephalic and atraumatic.     Comments: Short brown hair with slight graying.    Mouth/Throat:     Mouth: Mucous membranes are moist.     Pharynx: Oropharynx is clear.  Eyes:     General: No scleral icterus.    Extraocular Movements: Extraocular movements intact.     Conjunctiva/sclera: Conjunctivae normal.     Pupils: Pupils are equal, round, and reactive to light.  Cardiovascular:     Rate and Rhythm: Normal rate and regular rhythm.     Heart sounds: Normal heart sounds. No murmur heard.   Pulmonary:     Effort: Pulmonary effort is normal. No respiratory distress.     Breath sounds: Normal breath sounds. No wheezing or rales.  Chest:     Chest wall: No tenderness.  Breasts:     Right: Skin change (scarring around nipple from 3-6 o'clock) present. No swelling, bleeding, mass, tenderness, axillary adenopathy or supraclavicular adenopathy.     Left: No swelling, bleeding, mass, skin change, tenderness, axillary adenopathy or supraclavicular adenopathy.    Abdominal:     General: Bowel sounds are normal. There is no distension.      Palpations: Abdomen is soft. There is no hepatomegaly, splenomegaly or mass.     Tenderness: There is no abdominal tenderness. There is no guarding or rebound.  Musculoskeletal:        General: No swelling or tenderness. Normal range of motion.     Cervical back: Normal range of motion and neck supple.  Lymphadenopathy:     Head:     Right side of head: No preauricular, posterior auricular or occipital adenopathy.     Left side of head: No preauricular, posterior auricular or occipital adenopathy.     Cervical: No cervical adenopathy.     Upper Body:     Right upper body: No supraclavicular or axillary adenopathy.     Left upper body: No supraclavicular or axillary adenopathy.     Lower Body: No right inguinal adenopathy. No left inguinal adenopathy.  Skin:    General: Skin is warm and dry.  Neurological:     Mental Status: She is alert and oriented to person, place, and time.  Psychiatric:        Behavior: Behavior normal.        Thought Content: Thought content normal.        Judgment: Judgment normal.    Appointment on 08/13/2020  Component Date Value Ref Range Status  . Sodium 08/13/2020 137  135 - 145 mmol/L Final  . Potassium 08/13/2020 4.0  3.5 - 5.1 mmol/L Final  . Chloride 08/13/2020 101  98 - 111 mmol/L Final  . CO2 08/13/2020 28  22 - 32 mmol/L Final  . Glucose, Bld 08/13/2020 103* 70 - 99 mg/dL Final   Glucose reference range applies only to samples taken after fasting for at least 8 hours.  . BUN 08/13/2020 16  8 - 23 mg/dL Final  . Creatinine, Ser 08/13/2020 1.19* 0.44 - 1.00 mg/dL Final  . Calcium 08/13/2020 9.3  8.9 - 10.3 mg/dL Final  . Total Protein 08/13/2020 7.4  6.5 - 8.1 g/dL Final  . Albumin 08/13/2020 4.1  3.5 - 5.0 g/dL Final  . AST 08/13/2020 21  15 - 41 U/L Final  . ALT 08/13/2020 18  0 - 44 U/L Final  . Alkaline Phosphatase 08/13/2020  52  38 - 126 U/L Final  . Total Bilirubin 08/13/2020 0.5  0.3 - 1.2 mg/dL Final  . GFR, Estimated 08/13/2020  47* >60 mL/min Final   Comment: (NOTE) Calculated using the CKD-EPI Creatinine Equation (2021)   . Anion gap 08/13/2020 8  5 - 15 Final   Performed at [facility], [address] [place], [place] [address]  . WBC 08/13/2020 3.4* 4.0 - 10.5 K/uL Final  . RBC 08/13/2020 4.76  3.87 - 5.11 MIL/uL Final  . Hemoglobin 08/13/2020 13.0  12.0 - 15.0 g/dL Final  . HCT 08/13/2020 40.0  36.0 - 46.0 % Final  . MCV 08/13/2020 84.0  80.0 - 100.0 fL Final  . MCH 08/13/2020 27.3  26.0 - 34.0 pg Final  . MCHC 08/13/2020 32.5  30.0 - 36.0 g/dL Final  . RDW 08/13/2020 14.6  11.5 - 15.5 % Final  . Platelets 08/13/2020 215  150 - 400 K/uL Final  . nRBC 08/13/2020 0.0  0.0 - 0.2 % Final  . Neutrophils Relative % 08/13/2020 44  % Final  . Neutro Abs 08/13/2020 1.5* 1.7 - 7.7 K/uL Final  . Lymphocytes Relative 08/13/2020 40  % Final  . Lymphs Abs 08/13/2020 1.3  0.7 - 4.0 K/uL Final  . Monocytes Relative 08/13/2020 12  % Final  . Monocytes Absolute 08/13/2020 0.4  0.1 - 1.0 K/uL Final  . Eosinophils Relative 08/13/2020 2  % Final  . Eosinophils Absolute 08/13/2020 0.1  0.0 - 0.5 K/uL Final  . Basophils Relative 08/13/2020 1  % Final  . Basophils Absolute 08/13/2020 0.0  0.0 - 0.1 K/uL Final  . Immature Granulocytes 08/13/2020 1  % Final  . Abs Immature Granulocytes 08/13/2020 0.02  0.00 - 0.07 K/uL Final   Performed at [place] [facility], [address] [place], [place] [address]    Assessment:  Lindsey Foster is a 74 y.o. 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  She received radiation to the right breast from 08/2005 - 09/2005 at the [facility] in [place].  She then received tamoxifen for 5 years (12/2005-11/2010).  Bilateral screening mammogram on 07/29/2020 revealed no evidence of malignancy.  CA27.29 has been followed:  37.9 on 07/04/2014, 43.9 on 07/24/2015, 54.3 on 08/21/2015, 46.2 on 09/22/2015, 38.7 on  03/29/2016, 37.8 on 07/25/2016, 35.9 on 07/27/2017, 39.4 on 07/31/2018, 46 on 09/03/2018, 44.7 on 10/04/2018, 42.5 on 08/01/2019, and 36.5 on 08/13/2020.  She has progressive osteoporosis.  Bone density on 12/11/2012 revealed a T score of -2.3 in the left femur.  Bone density on 07/17/2014 revealed a T-score of -3.0 in the left femoral neck and -1.8 in the L1-L4 spine.  Bone density on 07/20/2015 revealed osteoporosis with a T score of -3.1 in the left hip.  Bone density on 09/05/2017 revealed osteoporosis with a T-score of -2.8 in the left femur and -2.8 in the AP spine L1-L4. Bone density on 09/05/2019 revealed osteoporosis with a T score of -2.9 at the lumbar spine.   She previously developed GI symptoms on Fosamax and medication was discontinued.  She declined Zometa and Prolia.   She received the high dose flu vaccine on 07/27/2020.  Symptomatically, she feels "great." She has been less active since the COVID-19 pandemic. Her whole body itches at night.  He denies any breast concerns.  Exam is stable.  Plan: 1. Labs today: CBC with diff, CMP, CA27.29. 2. Right breast DCIS               Clinically she is doing well without evidence of recurrent disease.  Exam reveals no evidence of disease.             Bilateral screening mammogram on 07/29/2020 revealed no evidence of malignancy.             Re-review unclear significance of elevated CA 27.29.     Discuss that tumor markers are not traditionally followed for surveillance.     Discuss patient's thoughts about not checking CA 27.29.    Patient hesitant.                  Continue annual surveillance. 3. Osteoporosis             Bone density on 09/05/2019 revealed a T score of -2.9 in the lumbar spine.    She was unable to tolerate Fosamax and declined Zometa and Prolia.             She is on calcium and vitamin D. 4.   Pruritus   Etiology is unclear.  She denies any new medications, herbal products, soaps, detergents or perfumes.  She has  been utilizing lotions.    Consider allergy testing.  Patient declines. 5.   Bilateral screening mammogram on 07/29/2021. 6.   RTC in 1 year for MD assessment, labs (CBC with diff, CMP, CA 27-29), and review of mammogram.  I discussed the assessment and treatment plan with the patient.  The patient was provided an opportunity to ask questions and all were answered.  The patient agreed with the plan and demonstrated an understanding of the instructions.  The patient was advised to call back if the symptoms worsen or if the condition fails to improve as anticipated.    Melissa C. Corcoran, MD, PhD    08/13/2020, 11:30 AM  I, Emily J Tufford, am acting as scribe for Melissa C. Corcoran, MD, PhD.  I, Melissa C. Corcoran, MD, have reviewed the above documentation for accuracy and completeness, and I agree with the above.  

## 2020-08-13 ENCOUNTER — Inpatient Hospital Stay: Payer: Medicare Other | Attending: Hematology and Oncology | Admitting: Hematology and Oncology

## 2020-08-13 ENCOUNTER — Inpatient Hospital Stay: Payer: Medicare Other

## 2020-08-13 ENCOUNTER — Encounter: Payer: Self-pay | Admitting: Hematology and Oncology

## 2020-08-13 ENCOUNTER — Other Ambulatory Visit: Payer: Self-pay

## 2020-08-13 VITALS — BP 140/76 | HR 60 | Temp 97.1°F | Resp 18 | Wt 180.8 lb

## 2020-08-13 DIAGNOSIS — M810 Age-related osteoporosis without current pathological fracture: Secondary | ICD-10-CM

## 2020-08-13 DIAGNOSIS — D0511 Intraductal carcinoma in situ of right breast: Secondary | ICD-10-CM | POA: Diagnosis not present

## 2020-08-13 DIAGNOSIS — R978 Other abnormal tumor markers: Secondary | ICD-10-CM | POA: Insufficient documentation

## 2020-08-13 LAB — COMPREHENSIVE METABOLIC PANEL
ALT: 18 U/L (ref 0–44)
AST: 21 U/L (ref 15–41)
Albumin: 4.1 g/dL (ref 3.5–5.0)
Alkaline Phosphatase: 52 U/L (ref 38–126)
Anion gap: 8 (ref 5–15)
BUN: 16 mg/dL (ref 8–23)
CO2: 28 mmol/L (ref 22–32)
Calcium: 9.3 mg/dL (ref 8.9–10.3)
Chloride: 101 mmol/L (ref 98–111)
Creatinine, Ser: 1.19 mg/dL — ABNORMAL HIGH (ref 0.44–1.00)
GFR, Estimated: 47 mL/min — ABNORMAL LOW (ref >60)
Glucose, Bld: 103 mg/dL — ABNORMAL HIGH (ref 70–99)
Potassium: 4 mmol/L (ref 3.5–5.1)
Sodium: 137 mmol/L (ref 135–145)
Total Bilirubin: 0.5 mg/dL (ref 0.3–1.2)
Total Protein: 7.4 g/dL (ref 6.5–8.1)

## 2020-08-13 LAB — CBC WITH DIFFERENTIAL/PLATELET
Abs Immature Granulocytes: 0.02 10*3/uL (ref 0.00–0.07)
Basophils Absolute: 0 10*3/uL (ref 0.0–0.1)
Basophils Relative: 1 %
Eosinophils Absolute: 0.1 10*3/uL (ref 0.0–0.5)
Eosinophils Relative: 2 %
HCT: 40 % (ref 36.0–46.0)
Hemoglobin: 13 g/dL (ref 12.0–15.0)
Immature Granulocytes: 1 %
Lymphocytes Relative: 40 %
Lymphs Abs: 1.3 10*3/uL (ref 0.7–4.0)
MCH: 27.3 pg (ref 26.0–34.0)
MCHC: 32.5 g/dL (ref 30.0–36.0)
MCV: 84 fL (ref 80.0–100.0)
Monocytes Absolute: 0.4 10*3/uL (ref 0.1–1.0)
Monocytes Relative: 12 %
Neutro Abs: 1.5 10*3/uL — ABNORMAL LOW (ref 1.7–7.7)
Neutrophils Relative %: 44 %
Platelets: 215 10*3/uL (ref 150–400)
RBC: 4.76 MIL/uL (ref 3.87–5.11)
RDW: 14.6 % (ref 11.5–15.5)
WBC: 3.4 10*3/uL — ABNORMAL LOW (ref 4.0–10.5)
nRBC: 0 % (ref 0.0–0.2)

## 2020-08-13 NOTE — Progress Notes (Signed)
The patient

## 2020-08-14 LAB — CANCER ANTIGEN 27.29: CA 27.29: 36.5 U/mL (ref 0.0–38.6)

## 2020-09-23 NOTE — Progress Notes (Signed)
[place] [address] [place] Phone: [phone] Subjective:   I Lindsey Foster am serving as a Education administrator for Dr. Hulan Saas.  This visit occurred during the SARS-CoV-2 public health emergency.  Safety protocols were in place, including screening questions prior to the visit, additional usage of staff PPE, and extensive cleaning of exam room while observing appropriate contact time as indicated for disinfecting solutions.   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  Patient is going to release shoes differently.  We will continue conservative therapy.  Continue the different shoes and follow-up with me again for 12 weeks  Patient continues to want to do conservative therapy.  Declined any type of injection.  Last injection was in June 2021.  Could be a candidate for viscosupplementation but still does not want to do anything different.  Wants to avoid any surgical intervention.  Follow-up with me again in 3 months  Update 09/24/2020 Lindsey Foster is a 75 y.o. female coming in with complaint of left knee and left ankle pain. Patient states she has good and bad days. Sometimes she forgets the brace. Overall making progress.  Patient states that it is definitely not worsening.  Pain is only on the medial aspect of the knee.  Patient states that certain activities seem to make it worse.  Nothing no that is stopping her from activity at the moment.  Does notice still some mild instability when she does certain activities.  Patient has had x-rays of the ankle showing the patient does have chronic degenerative changes noted but otherwise fairly unremarkable.    Past Medical History:  Diagnosis Date  . Allergic rhinitis   . Anxiety     panic attacks in hot rooms  . Arthritis   . Blood in stool   . Breast cancer ([place]) 2006   radiation - Rt  . Chickenpox   . CKD (chronic kidney disease)   . Colon polyps   . Diverticulosis   . GERD (gastroesophageal reflux disease)   . Heart murmur   . Hyperlipidemia   . Hypertension   . Hypothyroidism   . Multiple thyroid nodules   . Osteopenia   . Personal history of radiation therapy   . Urinary incontinence   . UTI (lower urinary tract infection)   . Vitamin D deficiency disease    Past Surgical History:  Procedure Laterality Date  . BLADDER SURGERY    . BREAST EXCISIONAL BIOPSY Right 2006   positive  . 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 CESAREAN SECTION  1985  . COLONOSCOPY WITH PROPOFOL N/A 09/28/2015   Procedure: COLONOSCOPY WITH PROPOFOL;  Surgeon: Lollie Sails, MD;  [facility] END[facility]COPY;  Service: Endoscopy;  Laterality: N/A;  . DILATION AND CURETTAGE OF UTERUS  2012  . Thyroid nodule ablation  2003  . Uterine polyp removal  2009    Social History   Socioeconomic History  . Marital status: Widowed    Spouse name: Not on file  . Number of children: Not on file  . Years of education: Not on file  . Highest education level: Not on file  Occupational History  . Not on file  Tobacco Use  . Smoking status: Former Research scientist (life sciences)  .  Smokeless tobacco: Never Used  Vaping Use  . Vaping Use: Never used  Substance and Sexual Activity  . Alcohol use: No    Alcohol/week: 0.0 standard drinks  . Drug use: No  . Sexual activity: Not on file  Other Topics Concern  . Not on file  Social History Narrative  . Not on file   Social Determinants of Health   Financial Resource Strain: Not on file  Food Insecurity: Not on file  Transportation Needs: Not on file  Physical Activity: Not on file  Stress: Not on file  Social Connections: Not on file   Allergies  Allergen Reactions  . Latex Rash   Family History  Problem Relation Age of Onset  . Heart disease Other         Parent  . Kidney disease Other        Parent  . Uterine cancer Maternal Aunt   . Kidney disease Mother   . Angina Father   . Heart attack Father   . Multiple myeloma Brother   . Breast cancer Neg Hx      Current Outpatient Medications (Cardiovascular):  .  amLODipine (NORVASC) 2.5 MG tablet, TAKE 1 TABLET(2.5 MG) BY MOUTH DAILY  Current Outpatient Medications (Respiratory):  .  fluticasone (FLONASE SENSIMIST) 27.5 MCG/SPRAY nasal spray, Place 1 spray into the nose daily as needed.  .  loratadine (CLARITIN) 10 MG tablet, Take 1 tablet (10 mg total) by mouth daily.    Current Outpatient Medications (Other):  Marland Kitchen  Calcium Citrate-Vitamin D3 1000-400 LIQD, Take 1 tablet by mouth daily. Reported on 02/04/2016 .  clindamycin (CLEOCIN) 300 MG capsule, Take 300 mg by mouth 3 (three) times daily. .  clobetasol cream (TEMOVATE) 0.05 %, Apply topically 2 (two) times daily. For 2 weeks, then just on week days as needed .  famotidine (PEPCID) 20 MG tablet, TAKE 1 TABLET(20 MG) BY MOUTH TWICE DAILY BEFORE A MEAL .  Garlic 614 MG TABS, Take 1 tablet by mouth daily.  Marland Kitchen  ketotifen (ZADITOR) 0.025 % ophthalmic solution, 1 drop 2 (two) times daily. .  mometasone (ELOCON) 0.1 % lotion, Apply topically daily.   Reviewed prior external information including notes and imaging from  primary care provider As well as notes that were available from care everywhere and other healthcare systems.  Past medical history, social, surgical and family history all reviewed in electronic medical record.  No pertanent information unless stated regarding to the chief complaint.   Review of Systems:  No headache, visual changes, nausea, vomiting, diarrhea, constipation, dizziness, abdominal pain, skin rash, fevers, chills, night sweats, weight loss, swollen lymph nodes, body aches, joint swelling, chest pain, shortness of breath, mood changes. POSITIVE muscle aches  Objective  Blood pressure (!) 152/78, pulse  63, height $RemoveBe'5\' 7"'[id number]$  (1.702 m), weight 184 lb (83.5 kg), SpO2 97 %.   General: No apparent distress alert and oriented x3 mood and affect normal, dressed appropriately.  HEENT: Pupils equal, extraocular movements intact  Respiratory: Patient's speak in full sentences and does not appear short of breath  Cardiovascular: No lower extremity edema, non tender, no erythema  Gait antalgic Left knee exam shows the patient does have significant varus deformity of the right knee.  No significant tenderness to palpation over the medial aspect of the knee.  Patient lacks the last 10 degrees of flexion in the last 5 degrees of extension.  Instability noted with valgus and varus force.  Trace effusion of the knee noted as well.  Tightness of the hamstring noted.  Patient is nontender in the calf.  No pain in the popliteal area.  Ankle does have arthritic changes with limited range of motion.  Significant pes planus with overpronation of the hindfoot noted.    Impression and Recommendations:     The above documentation has been reviewed and is accurate and complete Lyndal Pulley, DO

## 2020-09-24 ENCOUNTER — Other Ambulatory Visit: Payer: Self-pay | Admitting: Family Medicine

## 2020-09-24 ENCOUNTER — Encounter: Payer: Self-pay | Admitting: Family Medicine

## 2020-09-24 ENCOUNTER — Ambulatory Visit (INDEPENDENT_AMBULATORY_CARE_PROVIDER_SITE_OTHER): Payer: Medicare Other | Admitting: Family Medicine

## 2020-09-24 ENCOUNTER — Other Ambulatory Visit: Payer: Self-pay

## 2020-09-24 DIAGNOSIS — M1712 Unilateral primary osteoarthritis, left knee: Secondary | ICD-10-CM

## 2020-09-24 NOTE — Assessment & Plan Note (Signed)
Significant arthritis of the left knee.  Nearly bone-on-bone osteoarthritic changes of the left knee the patient does have some translation occurring.  Discussed with patient about continuing to wear the custom brace.  Patient declined any type of injection.  Patient complains of more pain on the medial aspect but he did ask what types of signs and symptoms to look out for and when to seek medical attention we did discuss that increasing instability or any posterior pain patient should seek medical attention immediately.  We discussed the potential for Doppler but I think it is a low likelihood with no swelling of the lower extremity and no calf pain.  Patient declined.  Patient understood this.  Patient will follow up with me again in 3 months

## 2020-09-24 NOTE — Patient Instructions (Signed)
Good to see you Knee has a lot of arthritis seems to be trucking along Use brace when you do not have control over your environment Continue using good shoes IF any pain in the back of the knee associated with calf pain seek medical attention immediatly See me again in 3 months

## 2020-11-06 ENCOUNTER — Ambulatory Visit (INDEPENDENT_AMBULATORY_CARE_PROVIDER_SITE_OTHER): Payer: Medicare Other | Admitting: Family Medicine

## 2020-11-06 ENCOUNTER — Encounter: Payer: Self-pay | Admitting: Family Medicine

## 2020-11-06 ENCOUNTER — Other Ambulatory Visit: Payer: Self-pay

## 2020-11-06 VITALS — BP 122/80 | HR 67 | Temp 98.0°F | Ht 67.0 in | Wt 185.6 lb

## 2020-11-06 DIAGNOSIS — K5900 Constipation, unspecified: Secondary | ICD-10-CM | POA: Diagnosis not present

## 2020-11-06 DIAGNOSIS — E785 Hyperlipidemia, unspecified: Secondary | ICD-10-CM

## 2020-11-06 DIAGNOSIS — K219 Gastro-esophageal reflux disease without esophagitis: Secondary | ICD-10-CM | POA: Diagnosis not present

## 2020-11-06 DIAGNOSIS — M545 Low back pain, unspecified: Secondary | ICD-10-CM | POA: Diagnosis not present

## 2020-11-06 DIAGNOSIS — I10 Essential (primary) hypertension: Secondary | ICD-10-CM

## 2020-11-06 DIAGNOSIS — R7309 Other abnormal glucose: Secondary | ICD-10-CM

## 2020-11-06 LAB — COMPREHENSIVE METABOLIC PANEL
ALT: 14 U/L (ref 0–35)
AST: 15 U/L (ref 0–37)
Albumin: 4.5 g/dL (ref 3.5–5.2)
Alkaline Phosphatase: 69 U/L (ref 39–117)
BUN: 15 mg/dL (ref 6–23)
CO2: 30 mEq/L (ref 19–32)
Calcium: 9.7 mg/dL (ref 8.4–10.5)
Chloride: 103 mEq/L (ref 96–112)
Creatinine, Ser: 1.23 mg/dL — ABNORMAL HIGH (ref 0.40–1.20)
GFR: 42.55 mL/min — ABNORMAL LOW (ref >60.00)
Glucose, Bld: 97 mg/dL (ref 70–99)
Potassium: 4.4 mEq/L (ref 3.5–5.1)
Sodium: 141 mEq/L (ref 135–145)
Total Bilirubin: 0.5 mg/dL (ref 0.2–1.2)
Total Protein: 7.4 g/dL (ref 6.0–8.3)

## 2020-11-06 LAB — LIPID PANEL
Cholesterol: 227 mg/dL — ABNORMAL HIGH (ref 0–200)
HDL: 68.4 mg/dL (ref >39.00)
LDL Cholesterol: 148 mg/dL — ABNORMAL HIGH (ref 0–99)
NonHDL: 158.76
Total CHOL/HDL Ratio: 3
Triglycerides: 55 mg/dL (ref 0.0–149.0)
VLDL: 11 mg/dL (ref 0.0–40.0)

## 2020-11-06 LAB — HEMOGLOBIN A1C: Hgb A1c MFr Bld: 6.4 % (ref 4.6–6.5)

## 2020-11-06 NOTE — Assessment & Plan Note (Signed)
Adequate control.  Continue current regimen of amlodipine 2.5 mg once daily.Marland Kitchen

## 2020-11-06 NOTE — Assessment & Plan Note (Signed)
Suspect muscle strain versus SI dysfunction.  She has a reassuring exam today.  We will have her complete home exercises and if not improving over the next several weeks she will let us know.

## 2020-11-06 NOTE — Assessment & Plan Note (Signed)
I encouraged the patient to check and see which medication the GI physician had her try.  If it was MiraLAX I advised the patient could try a fiber supplement.  If it was a fiber supplement that the GI physician had her try I would have the patient try MiraLAX.

## 2020-11-06 NOTE — Assessment & Plan Note (Signed)
Some occasional issues with this based on what she is eating.  I encouraged her to avoid foods that trigger this.  Discussed that the Pepcid may work better if she takes it before meals.

## 2020-11-06 NOTE — Progress Notes (Signed)
[name], MD Phone: [phone]  Lindsey Foster is a 75 y.o. female who presents today for f/u.  HYPERTENSION  Disease Monitoring  Home BP Monitoring 130s/70s  Medications  Compliance-  Taking amlodipine.   GERD:   Reflux symptoms: Reports only about once a week if she has a dietary indiscretion.  She reports some discomfort in her chest that occurs after she walks upstairs though this only occurs after she had eaten and had coffee to drink.  If she does not eat and walks upstairs she does not have any issues.  She has seen cardiology and has had prior stress tests.   Abd pain: No   Blood in stool: Now   EGD: Now, does follow with GI  Medication: Occasional Pepcid  Back pain: Right low back discomfort starting over the last several weeks.  No specific injury.  Notes most the time she is fine though it will suddenly catch on her.  It is sore to touch.  No swelling.  No radiation.  No numbness or weakness.  No incontinence.  She started doing exercises at home with good benefit.  Constipation: She saw GI and they noted that they felt as though the patient was not emptying her bowels.  They recommended either MiraLAX or Metamucil (the patient cannot remember which).  The patient notes she took 1 dose and had fogginess and headache.  She took a half dose the second time and had similar symptoms and has not taken any since then.  Social History   Tobacco Use  Smoking Status Former Smoker  Smokeless Tobacco Never Used    Current Outpatient Medications on File Prior to Visit  Medication Sig Dispense Refill  . amLODipine (NORVASC) 2.5 MG tablet TAKE 1 TABLET(2.5 MG) BY MOUTH DAILY 90 tablet 1  . Calcium Citrate-Vitamin D3 1000-400 LIQD Take 1 tablet by mouth daily. Reported on 02/04/2016    . clindamycin (CLEOCIN) 300 MG capsule Take 300 mg by mouth 3 (three) times daily.    . clobetasol cream (TEMOVATE) 0.05 % Apply topically 2 (two) times daily. For 2 weeks, then just on  week days as needed 60 g 1  . famotidine (PEPCID) 20 MG tablet TAKE 1 TABLET(20 MG) BY MOUTH TWICE DAILY BEFORE A MEAL 180 tablet 1  . fluticasone (FLONASE SENSIMIST) 27.5 MCG/SPRAY nasal spray Place 1 spray into the nose daily as needed.     . Garlic 017 MG TABS Take 1 tablet by mouth daily.     Marland Kitchen ketotifen (ZADITOR) 0.025 % ophthalmic solution 1 drop 2 (two) times daily.    Marland Kitchen loratadine (CLARITIN) 10 MG tablet Take 1 tablet (10 mg total) by mouth daily. 30 tablet 11  . mometasone (ELOCON) 0.1 % lotion Apply topically daily.     No current facility-administered medications on file prior to visit.     ROS see history of present illness  Objective  Physical Exam Vitals:   11/06/20 1053 11/06/20 1119  BP: (!) 150/70 122/80  Pulse: 67   Temp: 98 F (36.7 C)   SpO2: 97%     BP Readings from Last 3 Encounters:  11/06/20 122/80  09/24/20 (!) 152/78  08/13/20 140/76   Wt Readings from Last 3 Encounters:  11/06/20 185 lb 9.6 oz (84.2 kg)  09/24/20 184 lb (83.5 kg)  08/13/20 180 lb 12.4 oz (82 kg)    Physical Exam Constitutional:      General: She is not in acute distress.    Appearance: She  is not diaphoretic.  Cardiovascular:     Rate and Rhythm: Normal rate and regular rhythm.     Heart sounds: Normal heart sounds.  Pulmonary:     Effort: Pulmonary effort is normal.     Breath sounds: Normal breath sounds.  Musculoskeletal:     Comments: Slight discomfort near her right SI joint, no overlying skin changes, no midline spine tenderness, no midline spine step-off  Skin:    General: Skin is warm and dry.  Neurological:     Mental Status: She is alert.     Comments: 5/5 strength bilateral quads, hamstrings, plantar flexion, and dorsiflexion, sensation light touch intact bilateral lower extremities      Assessment/Plan: Please see individual problem list.  Problem List Items Addressed This Visit    Constipation    I encouraged the patient to check and see which  medication the GI physician had her try.  If it was MiraLAX I advised the patient could try a fiber supplement.  If it was a fiber supplement that the GI physician had her try I would have the patient try MiraLAX.      Essential hypertension - Primary    Adequate control.  Continue current regimen of amlodipine 2.5 mg once daily..      Relevant Orders   Lipid panel   Comp Met (CMET)   GERD (gastroesophageal reflux disease)    Some occasional issues with this based on what she is eating.  I encouraged her to avoid foods that trigger this.  Discussed that the Pepcid may work better if she takes it before meals.      Hyperlipidemia   Relevant Orders   Lipid panel   Comp Met (CMET)   Right low back pain    Suspect muscle strain versus SI dysfunction.  She has a reassuring exam today.  We will have her complete home exercises and if not improving over the next several weeks she will let us know.       Other Visit Diagnoses    Elevated glucose       Relevant Orders   HgB A1c       This visit occurred during the SARS-CoV-2 public health emergency.  Safety protocols were in place, including screening questions prior to the visit, additional usage of staff PPE, and extensive cleaning of exam room while observing appropriate contact time as indicated for disinfecting solutions.    [name], MD [place]

## 2020-11-06 NOTE — Patient Instructions (Signed)
Nice to see you. Please do the following exercises for your back. Please continue to monitor your blood pressure. Please try to avoid coffee and other foods that induce reflux.   Back Exercises These exercises help to make your trunk and back strong. They also help to keep the lower back flexible. Doing these exercises can help to prevent back pain or lessen existing pain.  If you have back pain, try to do these exercises 2-3 times each day or as told by your doctor.  As you get better, do the exercises once each day. Repeat the exercises more often as told by your doctor.  To stop back pain from coming back, do the exercises once each day, or as told by your doctor. Exercises Single knee to chest Do these steps 3-5 times in a row for each leg: 1. Lie on your back on a firm bed or the floor with your legs stretched out. 2. Bring one knee to your chest. 3. Grab your knee or thigh with both hands and hold them it in place. 4. Pull on your knee until you feel a gentle stretch in your lower back or buttocks. 5. Keep doing the stretch for 10-30 seconds. 6. Slowly let go of your leg and straighten it. Pelvic tilt Do these steps 5-10 times in a row: 1. Lie on your back on a firm bed or the floor with your legs stretched out. 2. Bend your knees so they point up to the ceiling. Your feet should be flat on the floor. 3. Tighten your lower belly (abdomen) muscles to press your lower back against the floor. This will make your tailbone point up to the ceiling instead of pointing down to your feet or the floor. 4. Stay in this position for 5-10 seconds while you gently tighten your muscles and breathe evenly. Cat-cow Do these steps until your lower back bends more easily: 1. Get on your hands and knees on a firm surface. Keep your hands under your shoulders, and keep your knees under your hips. You may put padding under your knees. 2. Let your head hang down toward your chest. Tighten (contract)  the muscles in your belly. Point your tailbone toward the floor so your lower back becomes rounded like the back of a cat. 3. Stay in this position for 5 seconds. 4. Slowly lift your head. Let the muscles of your belly relax. Point your tailbone up toward the ceiling so your back forms a sagging arch like the back of a cow. 5. Stay in this position for 5 seconds.   Press-ups Do these steps 5-10 times in a row: 1. Lie on your belly (face-down) on the floor. 2. Place your hands near your head, about shoulder-width apart. 3. While you keep your back relaxed and keep your hips on the floor, slowly straighten your arms to raise the top half of your body and lift your shoulders. Do not use your back muscles. You may change where you place your hands in order to make yourself more comfortable. 4. Stay in this position for 5 seconds. 5. Slowly return to lying flat on the floor.   Bridges Do these steps 10 times in a row: 1. Lie on your back on a firm surface. 2. Bend your knees so they point up to the ceiling. Your feet should be flat on the floor. Your arms should be flat at your sides, next to your body. 3. Tighten your butt muscles and lift your butt off  the floor until your waist is almost as high as your knees. If you do not feel the muscles working in your butt and the back of your thighs, slide your feet 1-2 inches farther away from your butt. 4. Stay in this position for 3-5 seconds. 5. Slowly lower your butt to the floor, and let your butt muscles relax. If this exercise is too easy, try doing it with your arms crossed over your chest.   Belly crunches Do these steps 5-10 times in a row: 1. Lie on your back on a firm bed or the floor with your legs stretched out. 2. Bend your knees so they point up to the ceiling. Your feet should be flat on the floor. 3. Cross your arms over your chest. 4. Tip your chin a little bit toward your chest but do not bend your neck. 5. Tighten your belly muscles  and slowly raise your chest just enough to lift your shoulder blades a tiny bit off of the floor. Avoid raising your body higher than that, because it can put too much stress on your low back. 6. Slowly lower your chest and your head to the floor. Back lifts Do these steps 5-10 times in a row: 1. Lie on your belly (face-down) with your arms at your sides, and rest your forehead on the floor. 2. Tighten the muscles in your legs and your butt. 3. Slowly lift your chest off of the floor while you keep your hips on the floor. Keep the back of your head in line with the curve in your back. Look at the floor while you do this. 4. Stay in this position for 3-5 seconds. 5. Slowly lower your chest and your face to the floor. Contact a doctor if:  Your back pain gets a lot worse when you do an exercise.  Your back pain does not get better 2 hours after you exercise. If you have any of these problems, stop doing the exercises. Do not do them again unless your doctor says it is okay. Get help right away if:  You have sudden, very bad back pain. If this happens, stop doing the exercises. Do not do them again unless your doctor says it is okay. This information is not intended to replace advice given to you by your health care provider. Make sure you discuss any questions you have with your health care provider. Document Revised: 04/26/2018 Document Reviewed: 04/26/2018 Elsevier Patient Education  2021 [employer].

## 2020-11-09 ENCOUNTER — Ambulatory Visit: Payer: Medicare Other | Attending: Internal Medicine

## 2020-11-09 ENCOUNTER — Other Ambulatory Visit (HOSPITAL_COMMUNITY): Payer: Self-pay | Admitting: Internal Medicine

## 2020-11-09 DIAGNOSIS — Z23 Encounter for immunization: Secondary | ICD-10-CM

## 2020-11-09 DIAGNOSIS — N1832 Chronic kidney disease, stage 3b: Secondary | ICD-10-CM | POA: Diagnosis not present

## 2020-11-09 NOTE — Progress Notes (Signed)
   Covid-19 Vaccination Clinic  Name:  Lindsey Foster    MRN: [id number] DOB: [birth year]  11/09/2020  [name] was observed post Covid-19 immunization for 15 minutes without incident. She was provided with Vaccine Information Sheet and instruction to access the V-Safe system.   [name] was instructed to call 911 with any severe reactions post vaccine: Marland Kitchen Difficulty breathing  . Swelling of face and throat  . A fast heartbeat  . A bad rash all over body  . Dizziness and weakness   Immunizations Administered    Name Date Dose VIS Date Route   PFIZER Comrnaty(Gray TOP) Covid-19 Vaccine 11/09/2020 11:43 AM 0.3 mL 07/23/2020 Intramuscular   Manufacturer: [employer], [employer]   Lot: [id number]   [place]: [phone]

## 2020-11-12 DIAGNOSIS — I10 Essential (primary) hypertension: Secondary | ICD-10-CM | POA: Diagnosis not present

## 2020-11-12 DIAGNOSIS — N1831 Chronic kidney disease, stage 3a: Secondary | ICD-10-CM | POA: Diagnosis not present

## 2020-11-12 DIAGNOSIS — N2581 Secondary hyperparathyroidism of renal origin: Secondary | ICD-10-CM | POA: Diagnosis not present

## 2020-12-16 DIAGNOSIS — H2513 Age-related nuclear cataract, bilateral: Secondary | ICD-10-CM | POA: Diagnosis not present

## 2020-12-17 ENCOUNTER — Ambulatory Visit: Payer: Medicare Other | Admitting: Family Medicine

## 2021-01-08 ENCOUNTER — Telehealth: Payer: Self-pay | Admitting: Family Medicine

## 2021-01-08 NOTE — Telephone Encounter (Signed)
PT called in stating her granddaughter tested positive for covid on Tuesday and since then has been fever free for at least 24 hours. PT states that she tested negative but wants to know what she can do to help her granddaughter to get better as she did some research and read about the antiviral meds. She would like a call for advise on what to do.

## 2021-01-22 NOTE — Progress Notes (Signed)
[facility] [place] Phone: [phone] Subjective:   Fontaine No, am serving as a scribe for Dr. Hulan Saas. This visit occurred during the SARS-CoV-2 public health emergency.  Safety protocols were in place, including screening questions prior to the visit, additional usage of staff PPE, and extensive cleaning of exam room while observing appropriate contact time as indicated for disinfecting solutions.   I'm seeing this patient by the request  of:  [name], MD  CC: Low back pain  JQB:[id number]  09/24/2020 Significant arthritis of the left knee.  Nearly bone-on-bone osteoarthritic changes of the left knee the patient does have some translation occurring.  Discussed with patient about continuing to wear the custom brace.  Patient declined any type of injection.  Patient complains of more pain on the medial aspect but he did ask what types of signs and symptoms to look out for and when to seek medical attention we did discuss that increasing instability or any posterior pain patient should seek medical attention immediately.  We discussed the potential for Doppler but I think it is a low likelihood with no swelling of the lower extremity and no calf pain.  Patient declined.  Patient understood this.  Patient will follow up with me again in 3 months  Update 01/25/2021 Alliana Mcauliff is a 75 y.o. female coming in with complaint of L knee and LBP pain. Patient states that she has been having R SI joint pain since March. Pain with twisting or stepping a certain way. No pain with rest. Treating pain with Tylenol, ambulates with can and uses lumbar support. Patient notes having to use R leg when she ascends stairs due to L knee pain. Does mention that she feels like she has bloating but does have bowel movement daily without complete emptying though. Is working with gastro for these issues. Denies falling.    Past  medical history significant for stage III chronic kidney disease   Past Medical History:  Diagnosis Date   Allergic rhinitis    Anxiety    panic attacks in hot rooms   Arthritis    Blood in stool    Breast cancer ([place]) 2006   radiation - Rt   Chickenpox    CKD (chronic kidney disease)    Colon polyps    Diverticulosis    GERD (gastroesophageal reflux disease)    Heart murmur    Hyperlipidemia    Hypertension    Hypothyroidism    Multiple thyroid nodules    Osteopenia    Personal history of radiation therapy    Urinary incontinence    UTI (lower urinary tract infection)    Vitamin D deficiency disease    Past Surgical History:  Procedure Laterality Date   BLADDER SURGERY     BREAST EXCISIONAL BIOPSY Right 2006   positive   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   CESAREAN SECTION  1985   COLONOSCOPY WITH PROPOFOL N/A 09/28/2015   Procedure: COLONOSCOPY WITH PROPOFOL;  Surgeon: Lollie Sails, MD;  Location: [facility] ENDOSCOPY;  Service: Endoscopy;  Laterality: N/A;   DILATION AND CURETTAGE OF UTERUS  2012   Thyroid nodule ablation  2003   Uterine polyp removal  2009    Social History   Socioeconomic History   Marital status: Widowed    Spouse name: Not on file   Number of children: Not on file   Years of education: Not on file  Highest education level: Not on file  Occupational History   Not on file  Tobacco Use   Smoking status: Former    Pack years: 0.00   Smokeless tobacco: Never  Vaping Use   Vaping Use: Never used  Substance and Sexual Activity   Alcohol use: No    Alcohol/week: 0.0 standard drinks   Drug use: No   Sexual activity: Not on file  Other Topics Concern   Not on file  Social History Narrative   Not on file   Social Determinants of Health   Financial Resource Strain: Not on file  Food Insecurity: Not on file  Transportation Needs: Not on file  Physical Activity: Not on file  Stress: Not on file  Social Connections: Not on file    Allergies  Allergen Reactions   Latex Rash   Family History  Problem Relation Age of Onset   Heart disease Other        Parent   Kidney disease Other        Parent   Uterine cancer Maternal Aunt    Kidney disease Mother    Angina Father    Heart attack Father    Multiple myeloma Brother    Breast cancer Neg Hx      Current Outpatient Medications (Cardiovascular):    amLODipine (NORVASC) 2.5 MG tablet, TAKE 1 TABLET(2.5 MG) BY MOUTH DAILY  Current Outpatient Medications (Respiratory):    fluticasone (FLONASE SENSIMIST) 27.5 MCG/SPRAY nasal spray, Place 1 spray into the nose daily as needed.    loratadine (CLARITIN) 10 MG tablet, Take 1 tablet (10 mg total) by mouth daily.    Current Outpatient Medications (Other):    Calcium Citrate-Vitamin D3 1000-400 LIQD, Take 1 tablet by mouth daily. Reported on 02/04/2016   clindamycin (CLEOCIN) 300 MG capsule, Take 300 mg by mouth 3 (three) times daily.   clobetasol cream (TEMOVATE) 0.05 %, Apply topically 2 (two) times daily. For 2 weeks, then just on week days as needed   COVID-19 mRNA Vac-TriS, Pfizer, SUSP injection, USE AS DIRECTED   famotidine (PEPCID) 20 MG tablet, TAKE 1 TABLET(20 MG) BY MOUTH TWICE DAILY BEFORE A MEAL   Garlic 518 MG TABS, Take 1 tablet by mouth daily.    ketotifen (ZADITOR) 0.025 % ophthalmic solution, 1 drop 2 (two) times daily.   mometasone (ELOCON) 0.1 % lotion, Apply topically daily.   Reviewed prior external information including notes and imaging from  primary care provider As well as notes that were available from care everywhere and other healthcare systems.  Past medical history, social, surgical and family history all reviewed in electronic medical record.  No pertanent information unless stated regarding to the chief complaint.   Review of Systems:  No headache, visual changes, nausea, vomiting, diarrhea, constipation, dizziness, abdominal pain, skin rash, fevers, chills, night sweats, weight  loss, swollen lymph nodes, body aches, joint swelling, chest pain, shortness of breath, mood changes. POSITIVE muscle aches  Objective  Blood pressure 140/72, pulse 75, height $RemoveBe'5\' 7"'EoLiIPJIh$  (1.702 m), weight 183 lb (83 kg), SpO2 97 %.   General: No apparent distress alert and oriented x3 mood and affect normal, dressed appropriately.  HEENT: Pupils equal, extraocular movements intact  Respiratory: Patient's speak in full sentences and does not appear short of breath  Cardiovascular: Trace lower extremity edema, non tender, no erythema  Gait antalgic walking with the aid of a cane. Left knee exam does have her brace on.  Good extension and flexion noted. Back  exam shows the patient is tender to palpation over the sacroiliac joint and the paraspinal musculature in the lumbar spine on the right side.  Negative straight leg test noted today.  Negative Faber.  Tightness noted in the hamstrings bilaterally right greater than left.  Deep tendon reflexes appear to be intact.    Impression and Recommendations:     The above documentation has been reviewed and is accurate and complete Lyndal Pulley, DO

## 2021-01-25 ENCOUNTER — Ambulatory Visit (INDEPENDENT_AMBULATORY_CARE_PROVIDER_SITE_OTHER): Payer: Medicare Other | Admitting: Family Medicine

## 2021-01-25 ENCOUNTER — Ambulatory Visit (INDEPENDENT_AMBULATORY_CARE_PROVIDER_SITE_OTHER): Payer: Medicare Other

## 2021-01-25 ENCOUNTER — Encounter: Payer: Self-pay | Admitting: Family Medicine

## 2021-01-25 ENCOUNTER — Other Ambulatory Visit: Payer: Self-pay

## 2021-01-25 VITALS — BP 140/72 | HR 75 | Ht 67.0 in | Wt 183.0 lb

## 2021-01-25 DIAGNOSIS — M545 Low back pain, unspecified: Secondary | ICD-10-CM | POA: Diagnosis not present

## 2021-01-25 DIAGNOSIS — M25559 Pain in unspecified hip: Secondary | ICD-10-CM | POA: Diagnosis not present

## 2021-01-25 DIAGNOSIS — M533 Sacrococcygeal disorders, not elsewhere classified: Secondary | ICD-10-CM

## 2021-01-25 DIAGNOSIS — G8929 Other chronic pain: Secondary | ICD-10-CM

## 2021-01-25 NOTE — Assessment & Plan Note (Signed)
Seems to be more secondary to the sacroiliac joint.  Discussed with patient that there is some very intermittent radicular symptoms that is somewhat concerning.  We will get x-rays to further evaluate with patient having a history of breast cancer and osteoporosis to rule out such things as an occult fracture.  Patient has responded well to conservative therapy previously.  We will hold on medication secondary to patient's other comorbidities.  Patient will follow up with me again in 6 to 8 weeks

## 2021-01-25 NOTE — Patient Instructions (Addendum)
Xray today Mirilax 17g daily, Colace 100mg  daily for 10 days PT [facility] Exercises See me again in 6-8 weeks

## 2021-01-27 DIAGNOSIS — R0602 Shortness of breath: Secondary | ICD-10-CM | POA: Diagnosis not present

## 2021-01-27 DIAGNOSIS — I10 Essential (primary) hypertension: Secondary | ICD-10-CM | POA: Diagnosis not present

## 2021-01-27 DIAGNOSIS — E7849 Other hyperlipidemia: Secondary | ICD-10-CM | POA: Diagnosis not present

## 2021-02-11 ENCOUNTER — Ambulatory Visit: Payer: Medicare Other | Attending: Family Medicine | Admitting: Physical Therapy

## 2021-02-11 ENCOUNTER — Encounter: Payer: Self-pay | Admitting: Physical Therapy

## 2021-02-11 DIAGNOSIS — R262 Difficulty in walking, not elsewhere classified: Secondary | ICD-10-CM | POA: Diagnosis not present

## 2021-02-11 DIAGNOSIS — M545 Low back pain, unspecified: Secondary | ICD-10-CM | POA: Insufficient documentation

## 2021-02-11 DIAGNOSIS — G8929 Other chronic pain: Secondary | ICD-10-CM | POA: Insufficient documentation

## 2021-02-11 DIAGNOSIS — M6281 Muscle weakness (generalized): Secondary | ICD-10-CM | POA: Insufficient documentation

## 2021-02-11 NOTE — Therapy (Signed)
[facility] [address], [place], [address] Phone: [phone]   Fax:  [phone]  Physical Therapy Evaluation  Patient Details  Name: Lindsey Foster MRN: [id number] Date of Birth: 1945-10-11 No data recorded  Encounter Date: 02/11/2021    Past Medical History:  Diagnosis Date   Allergic rhinitis    Anxiety    panic attacks in hot rooms   Arthritis    Blood in stool    Breast cancer ([place]) 2006   radiation - Rt   Chickenpox    CKD (chronic kidney disease)    Colon polyps    Diverticulosis    GERD (gastroesophageal reflux disease)    Heart murmur    Hyperlipidemia    Hypertension    Hypothyroidism    Multiple thyroid nodules    Osteopenia    Personal history of radiation therapy    Urinary incontinence    UTI (lower urinary tract infection)    Vitamin D deficiency disease     Past Surgical History:  Procedure Laterality Date   BLADDER SURGERY     BREAST EXCISIONAL BIOPSY Right 2006   positive   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   CESAREAN SECTION  1985   COLONOSCOPY WITH PROPOFOL N/A 09/28/2015   Procedure: COLONOSCOPY WITH PROPOFOL;  Surgeon: Lollie Sails, MD;  Location: [facility] ENDOSCOPY;  Service: Endoscopy;  Laterality: N/A;   DILATION AND CURETTAGE OF UTERUS  2012   Thyroid nodule ablation  2003   Uterine polyp removal  2009     There were no vitals filed for this visit.     SUBJECTIVE Chief complaint:  Pt reports that she feels pain from the waist down.  Pt reports that she has a high level of pain tolerance and that she continues to do the thing she wants to do.  She understands that she can't walk fast, but she moves slower and is able to do anything she wants to do.  She notes the pain to be a nagging nuisance.  It is intermittent in nature, being present when she makes a movement.  She notes that it's worse in the AM, but as she moves, it improves.  Pt notes that since  seeing Dr. Tamala Julian, she is feeling much better, just having twinges of pain.  Pt notes that she feels as though her L LE is painful likely from compensating and states that it is"misery from the waist down, but it's not consistent"   History: history of breast cancer, osteoporosis, diastasis recti, umbilical hernia Referring Dx: S01.0,X32.35 (ICD-10-CM) - Chronic SI joint pain Referring Provider: Lyndal Pulley, DO Pain location: Lumbar region and SI joint Pain: Present 1/10, Best 1/10, Worst 10/10 Pain quality: pain quality: sharp and shooting Radiating pain: Yes; noting radicular symptoms in B LE's Numbness/Tingling: No 24 hour pain behavior:  Aggravating factors: standing for prolonged periods of time, placing weight through her legs Easing factors: sitting, lying down,  How long can you sit: >1 hour How long can you stand: able to stand without moving, but once she moves it hurts How long can you walk: Pt able to ambulate in grocery/department store, but leans on the shopping cart for support History of back injury, pain, surgery, or therapy: No Follow-up appointment with MD: Yes; End of July Dominant hand: right Imaging: Yes  Falls in the last 6 months: No  Occupational demands: Hobbies: Puzzles, Crafts Goals: To increase her mobility in  her legs, and decrease pain in back when she moves. Red flags (bowel/bladder changes, saddle paresthesia, personal history of cancer, chills/fever, night sweats, unrelenting pain, first onset of insidious LBP <20 y/o)   When she first started feeling her back pain ~3 months ago.  Pt notes that if she went to the restroom, she was able to allow her muscles to relax and have some form of relief in the low back symptoms that she was experiencing.  Pt does feel like she's not able to completely empty when she experiences a bowel movement.      OBJECTIVE  Mental Status Patient is oriented to person, place and time.  Recent memory is intact.  Remote  memory is intact.  Attention span and concentration are intact.  Expressive speech is intact.  Patient's fund of knowledge is within normal limits for educational level.  SENSATION: Grossly intact to light touch bilateral LEs as determined by testing dermatomes L2-S2 Proprioception and hot/cold testing deferred on this date   MUSCULOSKELETAL: Tremor: None Bulk: Normal Tone: Normal No visible step-off along spinal column  Posture Lumbar lordosis: WNL Iliac crest height: equal bilaterally Lumbar lateral shift: negative Lower crossed syndrome (tight hip flexors and erector spinae; weak gluts and abs): negative  Gait Pt has genu varus, with the L being worse than R.  Pt also has small leg length discrepancy as well, with the L being shorter than the right, which pt attributes to having bow legged LE's.     Palpation Tissue restriction in the erector spinae of the lumbar and lower thoracic region.  Pt has considerable muscle bulking in standing that relaxes when in prone position.    Umbilical Hernia and Diastasis Recti   Strength (out of 5) R/L 4+/4 Hip flexion 4+/4+ Knee extension 4/4 Knee flexion  *Indicates pain   AROM (degrees) R/L (all movements include overpressure unless otherwise stated) Pt able to bend at lumbar and touch floor with no complications and side bending and extension were WNL as well.  Hamstring Length:  66 deg of R SLR 71 deg of L SLR      Repeated Movements No centralization or peripheralization of symptoms with repeated lumbar extension or flexion.      Passive Accessory Intervertebral Motion (PAIVM) Pt denies reproduction of back pain with CPA L1-L5 and UPA bilaterally L1-L5. Generally hypomobile throughout  Pt does note an increase in pressure at L2 segment of the spine   Passive Physiological Intervertebral Motion (PPIVM) Normal flexion and extension with PPIVM testing     Functional Tasks Lifting: not assessed  today  Squatting: not assessed today  Sit to stand: Pt is able to perform however notes a catch in her LE's that requires her to shake her leg before attempting to ambulate/move once in standing position.   ASSESSMENT Clinical Impression: Pt is a pleasant 75 year-old female referred for SIJ joint pain/low back pain. PT examination reveals deficits of decreased strength in B LE', decreased mobility of the lumbar spine with mobilization, significant muscle tightness in the quadriceps and thoracic/lumbar paraspinals. Pt presents with deficits in strength, mobility, range of motion, and pain. Pt will benefit from skilled PT services to address deficits and return to pain-free function at home.    Pt will be independent with HEP in order to improve strength and decrease back pain in order to improve pain-free function at home.  Patient will demonstrate improved function as evidenced by a score of 65 on FOTO measure for full participation in  activities at home and in the community.  Pt will decrease worst back pain as reported on NPRS by at least 2 points in order to demonstrate clinically significant reduction in back pain.   Pt will increase strength of B LE by at least 1/2 MMT grade in order to demonstrate improvement in strength and function.      TREATMENT  Therapeutic exercise: to centralize symptoms and improve ROM, strength, muscular endurance, and activity tolerance required for successful completion of functional activities.   Prone Press Up On Elbows 30 sec bouts with pt reporting decreased pain upon performing Supine Lower Trunk Rotation 2x10 each side to increase joint mobility in the lumbar region Supine Double Knee to Chest 2x10 to increase joint mobility of the lumbar spine   Pt educated on HEP of spinal mobilization exercises in order to increase overall ROM and provided increased tissue extensibility.      Objective measurements completed on examination: See above  findings.       Patient will benefit from skilled therapeutic intervention in order to improve the following deficits and impairments:     Visit Diagnosis: No diagnosis found.     Problem List Patient Active Problem List   Diagnosis Date Noted   Constipation 11/06/2020   Right low back pain 11/06/2020   Dental infection 55/37/4827   Umbilical hernia 07/86/7544   Osteopenia 04/07/2020   Arthritis of left ankle 11/13/2019   Bilateral leg edema 11/06/2019   Left foot pain 11/06/2019   Hot flashes 11/06/2019   Stage 3 chronic kidney disease ([place]) 06/23/2019   Vitamin D deficiency 06/23/2019   Degenerative arthritis of knee 02/17/2019   Vertigo 02/17/2019   Toe pain, right 02/17/2019   Costochondritis 08/01/2017   Urge incontinence 03/16/2017   Allergic rhinitis 08/12/2016   Overweight 08/12/2016   Shortness of breath when humid outside 02/04/2016   History of thyroid nodule 02/04/2016   Anxiety 11/01/2015   Hyperlipidemia 07/30/2015   Essential hypertension 07/30/2015   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   Stiffness of left knee 07/30/2015   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, [place], [address] Phone: [phone]   Fax:  [phone]  Name: Rupa Lagan MRN: [id number] Date of Birth: 1945/12/21

## 2021-02-12 ENCOUNTER — Telehealth: Payer: Self-pay | Admitting: Family Medicine

## 2021-02-12 DIAGNOSIS — R1030 Lower abdominal pain, unspecified: Secondary | ICD-10-CM

## 2021-02-12 DIAGNOSIS — K625 Hemorrhage of anus and rectum: Secondary | ICD-10-CM

## 2021-02-12 NOTE — Addendum Note (Signed)
Addended by: Caryl Bis Donovan Gatchel G on: 02/12/2021 11:24 AM   Modules accepted: Orders

## 2021-02-12 NOTE — Addendum Note (Signed)
Addended by: Caryl Bis, Layloni Fahrner G on: 02/12/2021 12:10 PM   Modules accepted: Orders

## 2021-02-12 NOTE — Telephone Encounter (Signed)
Patient stated she has been having Lower abdominal px for a while and she recently noticed blood after BM on toilet paper. No dark, black tarry stools.

## 2021-02-12 NOTE — Telephone Encounter (Signed)
Referral placed.  Can you check with the patient regarding what symptoms she is currently having?

## 2021-02-12 NOTE — Telephone Encounter (Signed)
Patient called and is requesting a referral to GI, she would like to go to Dr. Lyla Glassing, at [facility]. Office phone number is [phone].

## 2021-02-12 NOTE — Telephone Encounter (Signed)
Noted. If she has a large amount of blood associated with her abdominal pain she needs to go to the ED for evaluation.

## 2021-02-16 ENCOUNTER — Ambulatory Visit (INDEPENDENT_AMBULATORY_CARE_PROVIDER_SITE_OTHER): Payer: Medicare Other

## 2021-02-16 VITALS — Ht 67.0 in | Wt 183.0 lb

## 2021-02-16 DIAGNOSIS — Z0000 Encounter for general adult medical examination without abnormal findings: Secondary | ICD-10-CM

## 2021-02-16 NOTE — Patient Instructions (Addendum)
Lindsey Foster , Thank you for taking time to come for your Medicare Wellness Visit. I appreciate your ongoing commitment to your health goals. Please review the following plan we discussed and let me know if I can assist you in the future.   These are the goals we discussed:  Goals      Maintain Healthy Lifestyle     Stay active, exercise Healthy diet         This is a list of the screening recommended for you and due dates:  Health Maintenance  Topic Date Due   COVID-19 Vaccine (4 - Booster for Pfizer series) 03/04/2021*   Zoster (Shingles) Vaccine (1 of 2) 05/19/2021*   Colon Cancer Screening  02/16/2022*   Tetanus Vaccine  02/16/2022*   Pneumonia vaccines (1 of 2 - PCV13) 02/16/2022*   Flu Shot  03/15/2021   DEXA scan (bone density measurement)  Completed   HPV Vaccine  Aged Out  *Topic was postponed. The date shown is not the original due date.    Advanced directives: End of life planning; Advance aging; Advanced directives discussed.  Copy of current HCPOA/Living Will requested.    Conditions/risks identified: none new   Next appointment: Follow up in one year for your annual wellness visit    Preventive Care 65 Years and Older, Female Preventive care refers to lifestyle choices and visits with your health care provider that can promote health and wellness. What does preventive care include? A yearly physical exam. This is also called an annual well check. Dental exams once or twice a year. Routine eye exams. Ask your health care provider how often you should have your eyes checked. Personal lifestyle choices, including: Daily care of your teeth and gums. Regular physical activity. Eating a healthy diet. Avoiding tobacco and drug use. Limiting alcohol use. Practicing safe sex. Taking low-dose aspirin every day. Taking vitamin and mineral supplements as recommended by your health care provider. What happens during an annual well check? The services and  screenings done by your health care provider during your annual well check will depend on your age, overall health, lifestyle risk factors, and family history of disease. Counseling  Your health care provider may ask you questions about your: Alcohol use. Tobacco use. Drug use. Emotional well-being. Home and relationship well-being. Sexual activity. Eating habits. History of falls. Memory and ability to understand (cognition). Work and work Statistician. Reproductive health. Screening  You may have the following tests or measurements: Height, weight, and BMI. Blood pressure. Lipid and cholesterol levels. These may be checked every 5 years, or more frequently if you are over 56 years old. Skin check. Lung cancer screening. You may have this screening every year starting at age 80 if you have a 30-pack-year history of smoking and currently smoke or have quit within the past 15 years. Fecal occult blood test (FOBT) of the stool. You may have this test every year starting at age 33. Flexible sigmoidoscopy or colonoscopy. You may have a sigmoidoscopy every 5 years or a colonoscopy every 10 years starting at age 66. Hepatitis C blood test. Hepatitis B blood test. Sexually transmitted disease (STD) testing. Diabetes screening. This is done by checking your blood sugar (glucose) after you have not eaten for a while (fasting). You may have this done every 1-3 years. Bone density scan. This is done to screen for osteoporosis. You may have this done starting at age 70. Mammogram. This may be done every 1-2 years. Talk to your health care  provider about how often you should have regular mammograms. Talk with your health care provider about your test results, treatment options, and if necessary, the need for more tests. Vaccines  Your health care provider may recommend certain vaccines, such as: Influenza vaccine. This is recommended every year. Tetanus, diphtheria, and acellular pertussis (Tdap,  Td) vaccine. You may need a Td booster every 10 years. Zoster vaccine. You may need this after age 28. Pneumococcal 13-valent conjugate (PCV13) vaccine. One dose is recommended after age 81. Pneumococcal polysaccharide (PPSV23) vaccine. One dose is recommended after age 66. Talk to your health care provider about which screenings and vaccines you need and how often you need them. This information is not intended to replace advice given to you by your health care provider. Make sure you discuss any questions you have with your health care provider. Document Released: 08/28/2015 Document Revised: 04/20/2016 Document Reviewed: 06/02/2015 Elsevier Interactive Patient Education  2017 [place] Prevention in the Home Falls can cause injuries. They can happen to people of all ages. There are many things you can do to make your home safe and to help prevent falls. What can I do on the outside of my home? Regularly fix the edges of walkways and driveways and fix any cracks. Remove anything that might make you trip as you walk through a door, such as a raised step or threshold. Trim any bushes or trees on the path to your home. Use bright outdoor lighting. Clear any walking paths of anything that might make someone trip, such as rocks or tools. Regularly check to see if handrails are loose or broken. Make sure that both sides of any steps have handrails. Any raised decks and porches should have guardrails on the edges. Have any leaves, snow, or ice cleared regularly. Use sand or salt on walking paths during winter. Clean up any spills in your garage right away. This includes oil or grease spills. What can I do in the bathroom? Use night lights. Install grab bars by the toilet and in the tub and shower. Do not use towel bars as grab bars. Use non-skid mats or decals in the tub or shower. If you need to sit down in the shower, use a plastic, non-slip stool. Keep the floor dry. Clean up any  water that spills on the floor as soon as it happens. Remove soap buildup in the tub or shower regularly. Attach bath mats securely with double-sided non-slip rug tape. Do not have throw rugs and other things on the floor that can make you trip. What can I do in the bedroom? Use night lights. Make sure that you have a light by your bed that is easy to reach. Do not use any sheets or blankets that are too big for your bed. They should not hang down onto the floor. Have a firm chair that has side arms. You can use this for support while you get dressed. Do not have throw rugs and other things on the floor that can make you trip. What can I do in the kitchen? Clean up any spills right away. Avoid walking on wet floors. Keep items that you use a lot in easy-to-reach places. If you need to reach something above you, use a strong step stool that has a grab bar. Keep electrical cords out of the way. Do not use floor polish or wax that makes floors slippery. If you must use wax, use non-skid floor wax. Do not have throw rugs  and other things on the floor that can make you trip. What can I do with my stairs? Do not leave any items on the stairs. Make sure that there are handrails on both sides of the stairs and use them. Fix handrails that are broken or loose. Make sure that handrails are as long as the stairways. Check any carpeting to make sure that it is firmly attached to the stairs. Fix any carpet that is loose or worn. Avoid having throw rugs at the top or bottom of the stairs. If you do have throw rugs, attach them to the floor with carpet tape. Make sure that you have a light switch at the top of the stairs and the bottom of the stairs. If you do not have them, ask someone to add them for you. What else can I do to help prevent falls? Wear shoes that: Do not have high heels. Have rubber bottoms. Are comfortable and fit you well. Are closed at the toe. Do not wear sandals. If you use a  stepladder: Make sure that it is fully opened. Do not climb a closed stepladder. Make sure that both sides of the stepladder are locked into place. Ask someone to hold it for you, if possible. Clearly mark and make sure that you can see: Any grab bars or handrails. First and last steps. Where the edge of each step is. Use tools that help you move around (mobility aids) if they are needed. These include: Canes. Walkers. Scooters. Crutches. Turn on the lights when you go into a dark area. Replace any light bulbs as soon as they burn out. Set up your furniture so you have a clear path. Avoid moving your furniture around. If any of your floors are uneven, fix them. If there are any pets around you, be aware of where they are. Review your medicines with your doctor. Some medicines can make you feel dizzy. This can increase your chance of falling. Ask your doctor what other things that you can do to help prevent falls. This information is not intended to replace advice given to you by your health care provider. Make sure you discuss any questions you have with your health care provider. Document Released: 05/28/2009 Document Revised: 01/07/2016 Document Reviewed: 09/05/2014 Elsevier Interactive Patient Education  2017 [employer].

## 2021-02-16 NOTE — Progress Notes (Signed)
Subjective:   Lindsey Foster is a 75 y.o. female who presents for Medicare Annual (Subsequent) preventive examination.  Review of Systems    No ROS.  Medicare Wellness Virtual Visit.  Visual/audio telehealth visit, UTA vital signs.   See social history for additional risk factors.   Cardiac Risk Factors include: advanced age (>42men, >45 women);hypertension     Objective:    Today's Vitals   02/16/21 0951  Weight: 183 lb (83 kg)  Height: 5\' 7"  (1.702 m)   Body mass index is 28.66 kg/m.  Advanced Directives 02/16/2021 08/13/2020 02/14/2020 08/01/2019 02/13/2019 07/31/2018 02/08/2018  Does Patient Have a Medical Advance Directive? Yes No Yes Yes Yes Yes Yes  Type of 02/10/2018 of [place];Living will - Healthcare Power of [place];Living will Living will;Healthcare Power of Attorney Living will;Healthcare Power of Attorney - Living will;Healthcare Power of Attorney  Does patient want to make changes to medical advance directive? No - Patient declined - No - Patient declined No - Patient declined No - Patient declined - No - Patient declined  Copy of Healthcare Power of Attorney in Chart? No - copy requested - No - copy requested No - copy requested No - copy requested - No - copy requested  Would patient like information on creating a medical advance directive? - No - Patient declined - - - - -    Current Medications (verified) Outpatient Encounter Medications as of 02/16/2021  Medication Sig   amLODipine (NORVASC) 2.5 MG tablet TAKE 1 TABLET(2.5 MG) BY MOUTH DAILY   Calcium Citrate-Vitamin D3 1000-400 LIQD Take 1 tablet by mouth daily. Reported on 02/04/2016   clobetasol cream (TEMOVATE) 0.05 % Apply topically 2 (two) times daily. For 2 weeks, then just on week days as needed   COVID-19 mRNA Vac-TriS, Pfizer, SUSP injection USE AS DIRECTED   famotidine (PEPCID) 20 MG tablet TAKE 1 TABLET(20 MG) BY MOUTH TWICE DAILY BEFORE A MEAL   fluticasone (FLONASE  SENSIMIST) 27.5 MCG/SPRAY nasal spray Place 1 spray into the nose daily as needed.    Garlic 100 MG TABS Take 1 tablet by mouth daily.    ketotifen (ZADITOR) 0.025 % ophthalmic solution 1 drop 2 (two) times daily.   loratadine (CLARITIN) 10 MG tablet Take 1 tablet (10 mg total) by mouth daily.   mometasone (ELOCON) 0.1 % lotion Apply topically daily.   [DISCONTINUED] clindamycin (CLEOCIN) 300 MG capsule Take 300 mg by mouth 3 (three) times daily.   No facility-administered encounter medications on file as of 02/16/2021.    Allergies (verified) Latex   History: Past Medical History:  Diagnosis Date   Allergic rhinitis    Anxiety    panic attacks in hot rooms   Arthritis    Blood in stool    Breast cancer (HCC) 2006   radiation - Rt   Chickenpox    CKD (chronic kidney disease)    Colon polyps    Diverticulosis    GERD (gastroesophageal reflux disease)    Heart murmur    Hyperlipidemia    Hypertension    Hypothyroidism    Multiple thyroid nodules    Osteopenia    Personal history of radiation therapy    Urinary incontinence    UTI (lower urinary tract infection)    Vitamin D deficiency disease    Past Surgical History:  Procedure Laterality Date   BLADDER SURGERY     BREAST EXCISIONAL BIOPSY Right 2006   positive   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   CESAREAN SECTION  1985   COLONOSCOPY WITH PROPOFOL N/A 09/28/2015   Procedure: COLONOSCOPY WITH PROPOFOL;  Surgeon: Lollie Sails, MD;  Location: [facility] END[facility]COPY;  Service: Endoscopy;  Laterality: N/A;   DILATION AND CURETTAGE OF UTERUS  2012   Thyroid nodule ablation  2003   Uterine polyp removal  2009    Family History  Problem Relation Age of Onset   Heart disease Other        Parent   Kidney disease Other        Parent   Uterine cancer Maternal Aunt    Kidney disease Mother    Angina Father    Heart attack Father    Multiple myeloma Brother    Breast cancer Neg Hx    Social History   Socioeconomic History    Marital status: Widowed    Spouse name: Not on file   Number of children: Not on file   Years of education: Not on file   Highest education level: Not on file  Occupational History   Not on file  Tobacco Use   Smoking status: Former    Pack years: 0.00   Smokeless tobacco: Never  Vaping Use   Vaping Use: Never used  Substance and Sexual Activity   Alcohol use: No    Alcohol/week: 0.0 standard drinks   Drug use: No   Sexual activity: Not on file  Other Topics Concern   Not on file  Social History Narrative   Not on file   Social Determinants of Health   Financial Resource Strain: Low Risk    Difficulty of Paying Living Expenses: Not hard at all  Food Insecurity: No Food Insecurity   Worried About Charity fundraiser in the Last Year: Never true   [place] in the Last Year: Never true  Transportation Needs: No Transportation Needs   Lack of Transportation (Medical): No   Lack of Transportation (Non-Medical): No  Physical Activity: Insufficiently Active   Days of Exercise per Week: 2 days   Minutes of Exercise per Session: 30 min  Stress: No Stress Concern Present   Feeling of Stress : Not at all  Social Connections: Unknown   Frequency of Communication with Friends and Family: More than three times a week   Frequency of Social Gatherings with Friends and Family: Not on file   Attends Religious Services: Not on Electrical engineer or Organizations: Not on file   Attends Archivist Meetings: Not on file   Marital Status: Not on file    Tobacco Counseling Counseling given: Not Answered   Clinical Intake:  Pre-visit preparation completed: Yes        Diabetes: No  How often do you need to have someone help you when you read instructions, pamphlets, or other written materials from your doctor or pharmacy?: 1 - Never    Interpreter Needed?: No      Activities of Daily Living In your present state of health, do you have any  difficulty performing the following activities: 02/16/2021  Hearing? N  Vision? N  Difficulty concentrating or making decisions? N  Walking or climbing stairs? Y  Comment Chronic L leg/knee pain  Dressing or bathing? N  Doing errands, shopping? N  Preparing Food and eating ? N  Using the Toilet? N  In the past six months, have you accidently leaked urine? N  Comment Stress incontinence  Do you have problems  with loss of bowel control? N  Managing your Medications? N  Managing your Finances? N  Housekeeping or managing your Housekeeping? N  Some recent data might be hidden    Patient Care Team: [name], MD as PCP - General (Family Medicine)  Indicate any recent Medical Services you may have received from other than [facility] providers in the past year (date may be approximate).     Assessment:   This is a routine wellness examination for [place].  I connected with [place] today by telephone and verified that I am speaking with the correct person using two identifiers. Location patient: home Location provider: work Persons participating in the virtual visit: patient, Marine scientist.    I discussed the limitations, risks, security and privacy concerns of performing an evaluation and management service by telephone and the availability of in person appointments. The patient expressed understanding and verbally consented to this telephonic visit.    Interactive audio and video telecommunications were attempted between this provider and patient, however failed, due to patient having technical difficulties OR patient did not have access to video capability.  We continued and completed visit with audio only.  Some vital signs may be absent or patient reported.   Hearing/Vision screen Hearing Screening - Comments:: Patient is able to hear conversational tones without difficulty.  No issues reported.   Vision Screening - Comments:: Follow[facility]  Wears corrective lenses   Visual acuity not assessed, virtual visit. They have seen their ophthalmologist.     Dietary issues and exercise activities discussed: Current Exercise Habits: The patient does not participate in regular exercise at present Healthy diet Good water intake   Goals Addressed               This Visit's Progress     Patient Stated     COMPLETED: DIET - EAT MORE FRUITS AND VEGETABLES (pt-stated)        Healthy snacking and meals Low salt diet       Other     Maintain Healthy Lifestyle        Stay active, exercise Healthy diet        Depression Screen PHQ 2/9 Scores 02/16/2021 11/06/2020 05/08/2020 02/14/2020 11/06/2019 02/13/2019 07/24/2018  PHQ - 2 Score 0 0 0 0 0 1 1  PHQ- 9 Score - - - - - - -    Fall Risk Fall Risk  02/16/2021 11/06/2020 05/08/2020 02/14/2020 11/06/2019  Falls in the past year? 0 0 0 0 0  Number falls in past yr: 0 0 0 0 0  Injury with Fall? 0 - - - -  Follow up Falls evaluation completed Falls evaluation completed Falls evaluation completed Falls evaluation completed Falls evaluation completed    [address]: Handrails in use when climbing stairs? Yes Home free of loose throw rugs in walkways, pet beds, electrical cords, etc? Yes  Adequate lighting in your home to reduce risk of falls? Yes   ASSISTIVE DEVICES UTILIZED TO PREVENT FALLS:  Life alert? No  Use of a cane, walker or w/c? No   TIMED UP AND GO: Was the test performed? No .   Cognitive Function: Patient is alert and oriented x3.   Healthy Maintenance MMSE - Mini Mental State Exam 02/08/2018 02/07/2017  Orientation to time 5 5  Orientation to Place 5 5  Registration 3 3  Attention/ Calculation 5 5  Recall 3 3  Language- name 2 objects 2 2  Language- repeat 1 1  Language- follow 3 step command 3 3  Language- read & follow direction 1 1  Write a sentence 1 1  Copy design 1 1  Total score 30 30     6CIT Screen 02/14/2020 02/13/2019  What Year? 0 points 0  points  What month? 0 points 0 points  What time? - 0 points  Count back from 20 - 0 points  Months in reverse 0 points 0 points  Repeat phrase 0 points -    Immunizations Immunization History  Administered Date(s) Administered   Fluad Quad(high Dose 65+) 07/17/2019, 07/27/2020   PFIZER Comirnaty(Gray Top)Covid-19 Tri-Sucrose Vaccine 11/09/2020   PFIZER(Purple Top)SARS-COV-2 Vaccination 09/26/2019, 10/17/2019    TDAP status: Due, Education has been provided regarding the importance of this vaccine. Advised may receive this vaccine at local pharmacy or Health Dept. Aware to provide a copy of the vaccination record if obtained from local pharmacy or Health Dept. Verbalized acceptance and understanding. Deferred.   PNA vaccine- Due, Education has been provided regarding the importance of this vaccine. Advised may receive this vaccine in office, at local pharmacy or Health Dept. Aware to provide a copy of the vaccination record if obtained from local pharmacy or Health Dept. Verbalized acceptance and understanding. Deferred.   Colonoscopy- deferred per patient.   Health Maintenance Health Maintenance  Topic Date Due   COVID-19 Vaccine (4 - Booster for Pfizer series) 03/04/2021 (Originally 02/09/2021)   Zoster Vaccines- Shingrix (1 of 2) 05/19/2021 (Originally 11/17/1964)   COLONOSCOPY (Pts 45-68yrs Insurance coverage will need to be confirmed)  02/16/2022 (Originally 09/27/2020)   TETANUS/TDAP  02/16/2022 (Originally 11/17/1962)   PNA vac Low Risk Adult (1 of 2 - PCV13) 02/16/2022 (Originally 11/16/2008)   INFLUENZA VACCINE  03/15/2021   DEXA SCAN  Completed   HPV VACCINES  Aged Out   Dental Screening: Recommended annual dental exams for proper oral hygiene.  Community Resource Referral / Chronic Care Management: CRR required this visit?  No   CCM required this visit?  No      Plan:   Keep all routine maintenance appointments.   I have personally reviewed and noted the following in  the patient's chart:   Medical and social history Use of alcohol, tobacco or illicit drugs  Current medications and supplements including opioid prescriptions.  Functional ability and status Nutritional status Physical activity Advanced directives List of other physicians Hospitalizations, surgeries, and ER visits in previous 12 months Vitals Screenings to include cognitive, depression, and falls Referrals and appointments  In addition, I have reviewed and discussed with patient certain preventive protocols, quality metrics, and best practice recommendations. A written personalized care plan for preventive services as well as general preventive health recommendations were provided to patient via mychart.     Varney Biles, LPN   01/18/2946

## 2021-02-17 ENCOUNTER — Ambulatory Visit: Payer: Medicare Other | Attending: Family Medicine

## 2021-02-17 DIAGNOSIS — G8929 Other chronic pain: Secondary | ICD-10-CM | POA: Diagnosis not present

## 2021-02-17 DIAGNOSIS — R262 Difficulty in walking, not elsewhere classified: Secondary | ICD-10-CM | POA: Insufficient documentation

## 2021-02-17 DIAGNOSIS — M545 Low back pain, unspecified: Secondary | ICD-10-CM | POA: Insufficient documentation

## 2021-02-17 DIAGNOSIS — M6281 Muscle weakness (generalized): Secondary | ICD-10-CM | POA: Insufficient documentation

## 2021-02-17 NOTE — Therapy (Signed)
[facility] [address], [place], [address] Phone: [phone]   Fax:  [phone]  Physical Therapy Treatment  Patient Details  Name: Lindsey Foster MRN: [id number] Date of Birth: 17-Jul-1946 Referring Provider (PT): Lyndal Pulley, DO   Encounter Date: 02/17/2021   PT End of Session - 02/17/21 1035     Visit Number 2    Number of Visits 16    Date for PT Re-Evaluation 04/08/21    PT Start Time 0911    PT Stop Time 0945    PT Time Calculation (min) 34 min    Activity Tolerance Patient tolerated treatment well    Behavior During Therapy [facility] for tasks assessed/performed             Past Medical History:  Diagnosis Date   Allergic rhinitis    Anxiety    panic attacks in hot rooms   Arthritis    Blood in stool    Breast cancer ([place]) 2006   radiation - Rt   Chickenpox    CKD (chronic kidney disease)    Colon polyps    Diverticulosis    GERD (gastroesophageal reflux disease)    Heart murmur    Hyperlipidemia    Hypertension    Hypothyroidism    Multiple thyroid nodules    Osteopenia    Personal history of radiation therapy    Urinary incontinence    UTI (lower urinary tract infection)    Vitamin D deficiency disease     Past Surgical History:  Procedure Laterality Date   BLADDER SURGERY     BREAST EXCISIONAL BIOPSY Right 2006   positive   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   Subjective Assessment - 02/17/21 0916     Subjective Pt reports that her L knee hurts more than her back today. Pt states that she felt "great" after  last visit due to spinal mobilizations. She can tell that her back is still bothering her. HEP is going well, but she's having a hard time finding time to complete exercises. Ambulates into clinic with antalgic gait due to L knee pain, with brace donned. No changes to medication or falls since last visit.    Pertinent History Hx of Breast Cancer, Osteoporosis, Diastasis Recti, Umbilical Hernia    Limitations Standing;Walking;House hold activities    How long can you sit comfortably? >1 hour    How long can you stand comfortably? >1 hour until she has to move    How long can you walk comfortably? Short distances without AD, community ambulation with AD's.    Patient Stated Goals To increase her mobility in her legs, and decrease pain in back when she moves.    Currently in Pain? Yes    Pain Score 1     Pain Location Knee    Pain Orientation Left    Pain Descriptors / Indicators Aching    Pain Type Chronic pain    Pain Onset More than a month ago  Therapeutic exercise: to centralize symptoms and improve ROM, strength, muscular endurance, and activity tolerance required for successful completion of functional activities.  - Supine Lower Trunk Rotation x10 each side to increase joint mobility in the lumbar region - Supine Double Knee to Chest x10 to increase joint mobility of the lumbar spine - seated HS stretch, 3x30s ea - standing hip flexor stretch with UE support at table, 3x30s ea with cues for PPT and upright posture - PPT with TC at iliac crest, x10 with 5sec hold - PPT with bridge in hooklying position, x10 - STS from green chair (not today) - 3 way prayer stretch (not today) - Prone Press Up On Elbows x10 with 5s holds (not today)   Manual Therapy: to increase joint mobility of the to support increased overall ROM and provide relief to joint musculature. - sustained prone press up on elbows coupled with G2-3 CPA @ L-spine       PT Education - 02/17/21  0925     Education Details pt educated on HEP, correct performance of new interventions, biomechanics, safety    Person(s) Educated Patient    Methods Explanation;Demonstration;Tactile cues;Verbal cues    Comprehension Verbalized understanding;Returned demonstration;Verbal cues required;Tactile cues required;Need further instruction              PT Short Term Goals - 02/11/21 1404       PT SHORT TERM GOAL #1   Title Pt will be independent with HEP in order to improve strength and decrease back pain in order to improve pain-free function at home.    Baseline 02/11/21: Given HEP at initial eval    Time 2    Period Weeks    Status New    Target Date 02/25/21               PT Long Term Goals - 02/11/21 1405       PT LONG TERM GOAL #1   Title Patient will demonstrate improved function as evidenced by a score of 65 on FOTO measure for full participation in activities at home and in the community.    Baseline 02/11/21: FOTO - 52    Time 8    Period Weeks    Status New    Target Date 04/08/21      PT LONG TERM GOAL #2   Title Pt will decrease worst back pain as reported on NPRS by at least 2 points in order to demonstrate clinically significant reduction in back pain.    Baseline 02/11/21: Worst Pain reported 10/10    Time 8    Period Weeks    Status New    Target Date 04/08/21      PT LONG TERM GOAL #3   Title Pt will increase strength of B LE by at least 1/2 MMT grade in order to demonstrate improvement in strength and function.    Baseline 02/11/21: B LE Generalized Strength 4+/5    Time 8    Period Weeks    Status New    Target Date 04/08/21                   Plan - 02/17/21 1036     Clinical Impression Statement Pt tolerated treatment well today. Pt 11m late to appointment, therefore treatment time limited. Treatment focused on core stability, glute strength, and improving overall lumbar AROM. Pt states that with diastasis recti, she read on the  Internet that she was not supposed to perform bridging exercises,  however, PT discussed that with PPT and bridge it should be fine. G2-3 CPA's performed with prone on elbows to assist with improved joint mobility; mild soreness noted after intervention. Pt will continue to benefit from skilled OPPT services to address deficits and improve overall safety and functional mobility for return to baseline function. Plan for prayer stretch, STS, and other glute/core strengthening at next session.    Personal Factors and Comorbidities Age;Comorbidity 3+    Comorbidities Breast Cancer, Osteoporosis, Diastasis Recti, Umbilical Hernia    Examination-Activity Limitations Carry;Lift;Sit;Squat;Stand;Transfers    Examination-Participation Restrictions Cleaning;Community Activity;Laundry;Meal Prep;Shop;Yard Work    Stability/Clinical Decision Making Stable/Uncomplicated    Rehab Potential Good    PT Frequency 2x / week    PT Duration 8 weeks    PT Treatment/Interventions ADLs/Self Care Home Management;Aquatic Therapy;Cryotherapy;Electrical Stimulation;Moist Heat;Traction;Ultrasound;DME Instruction;Gait training;Functional mobility training;Therapeutic activities;Therapeutic exercise;Balance training;Neuromuscular re-education;Passive range of motion;Dry needling;Taping;Spinal Manipulations;Joint Manipulations    PT Next Visit Plan Assess HEP, perform manual therapy on lumbar region, specifically paraspinal for decreased tension/increased muscle extensibility.    PT Home Exercise Plan Access Code: [email or web address]  URL: [email or web address]  Date: 02/11/2021  Prepared by: Chapin Nation    Exercises  Prone Press Up On Elbows - 1 x daily - 7 x weekly - 3 sets - 10 reps  Supine Lower Trunk Rotation - 1 x daily - 7 x weekly - 3 sets - 10 reps  Supine Double Knee to Chest - 1 x daily - 7 x weekly - 3 sets - 10 reps    Consulted and Agree with Plan of Care Patient             Patient will benefit from skilled  therapeutic intervention in order to improve the following deficits and impairments:  Abnormal gait, Decreased activity tolerance, Decreased endurance, Decreased mobility, Decreased range of motion, Decreased strength, Difficulty walking, Hypomobility, Impaired flexibility, Pain  Visit Diagnosis: Chronic bilateral low back pain without sciatica  Impaired ambulation  Muscle weakness (generalized)     Problem List Patient Active Problem List   Diagnosis Date Noted   Constipation 11/06/2020   Right low back pain 11/06/2020   Dental infection 99/83/3825   Umbilical hernia 05/39/7673   Osteopenia 04/07/2020   Arthritis of left ankle 11/13/2019   Bilateral leg edema 11/06/2019   Left foot pain 11/06/2019   Hot flashes 11/06/2019   Stage 3 chronic kidney disease ([place]) 06/23/2019   Vitamin D deficiency 06/23/2019   Degenerative arthritis of knee 02/17/2019   Vertigo 02/17/2019   Toe pain, right 02/17/2019   Costochondritis 08/01/2017   Urge incontinence 03/16/2017   Allergic rhinitis 08/12/2016   Overweight 08/12/2016   Shortness of breath when humid outside 02/04/2016   History of thyroid nodule 02/04/2016   Anxiety 11/01/2015   Hyperlipidemia 07/30/2015   Essential hypertension 07/30/2015   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   Stiffness of left knee 07/30/2015   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 [place], [address] Phone: [phone]   Fax:  [phone]  Name: Lindsey Foster MRN: [id number] Date of Birth: 07/07/1946

## 2021-02-23 ENCOUNTER — Encounter: Payer: Self-pay | Admitting: Physical Therapy

## 2021-02-23 ENCOUNTER — Ambulatory Visit: Payer: Medicare Other | Admitting: Physical Therapy

## 2021-02-23 DIAGNOSIS — M545 Low back pain, unspecified: Secondary | ICD-10-CM

## 2021-02-23 DIAGNOSIS — R262 Difficulty in walking, not elsewhere classified: Secondary | ICD-10-CM

## 2021-02-23 DIAGNOSIS — M6281 Muscle weakness (generalized): Secondary | ICD-10-CM | POA: Diagnosis not present

## 2021-02-23 DIAGNOSIS — G8929 Other chronic pain: Secondary | ICD-10-CM | POA: Diagnosis not present

## 2021-02-23 NOTE — Therapy (Signed)
[facility] [address], [place], [address] Phone: [phone]   Fax:  [phone]  Physical Therapy Treatment  Patient Details  Name: Lindsey Foster MRN: [id number] Date of Birth: 02-10-1946 Referring Provider (PT): Lyndal Pulley, DO   Encounter Date: 02/23/2021   PT End of Session - 02/23/21 1003     Visit Number 3    Number of Visits 16    Date for PT Re-Evaluation 04/08/21    Authorization Type MEDICARE PART B Reporting period from 02/11/2021    PT Start Time 0903    PT Stop Time 0945    PT Time Calculation (min) 42 min    Activity Tolerance Patient tolerated treatment well    Behavior During Therapy [facility] for tasks assessed/performed             Past Medical History:  Diagnosis Date   Allergic rhinitis    Anxiety    panic attacks in hot rooms   Arthritis    Blood in stool    Breast cancer ([place]) 2006   radiation - Rt   Chickenpox    CKD (chronic kidney disease)    Colon polyps    Diverticulosis    GERD (gastroesophageal reflux disease)    Heart murmur    Hyperlipidemia    Hypertension    Hypothyroidism    Multiple thyroid nodules    Osteopenia    Personal history of radiation therapy    Urinary incontinence    UTI (lower urinary tract infection)    Vitamin D deficiency disease     Past Surgical History:  Procedure Laterality Date   BLADDER SURGERY     BREAST EXCISIONAL BIOPSY Right 2006   positive   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   CESAREAN SECTION  1985   COLONOSCOPY WITH PROPOFOL N/A 09/28/2015   Procedure: COLONOSCOPY WITH PROPOFOL;  Surgeon: Lollie Sails, MD;  Location: [facility] ENDOSCOPY;  Service: Endoscopy;  Laterality: N/A;   DILATION AND CURETTAGE OF UTERUS  2012   Thyroid nodule ablation  2003   Uterine polyp removal  2009     There were no vitals filed for this visit.   Subjective Assessment - 02/23/21 0916     Subjective Started going back to  water aerobics yesterday so that worked some muscles she is not used to using. Felt like she "pulled" something in the left groin and rates the pain there as 1/10 currently. States she has been doing her HEP and especially the hamstring stretches. She is hoping to stretch her legs out more and feels it helps her back. Did not get a handbound for HEP after last PT session. Her L knee still hurts and gets a "twitch" in the right lower back.    Pertinent History Hx of Breast Cancer, Osteoporosis, Diastasis Recti, Umbilical Hernia    Limitations Standing;Walking;House hold activities    How long can you sit comfortably? >1 hour    How long can you stand comfortably? >1 hour until she has to move    How long can you walk comfortably? Short distances without AD, community ambulation with AD's.    Patient Stated Goals To increase her mobility in her legs, and decrease pain in back when she moves.    Currently in Pain? Yes    Pain Score 1     Pain Onset More than a month ago  Therapeutic exercise: to centralize symptoms and improve ROM, strength, muscular endurance, and activity tolerance required for successful completion of functional activities.  - Supine Lower Trunk Rotation x10 each side to increase joint mobility in the lumbar region - Supine Double Knee to Chest x10 with 10 second hold to increase joint mobility of the lumbar spine - Seated HS stretch, 3x30s each side - Hooklying posterior pelvic tilt (PPT) x10 with 5sec hold. Patient provided education on palpation of illiac crest for self cueing at iliac crest.  - Hooklying PPT with bridge x10 - STS from green chair 1 x 12 without UE support. Verbal cues to emphasize sitting in a slow and controlled manner. (Noted for inability to flex left knee sufficiently to provide even stance resulting in more weight bearing through the R LE).  - Side stepping with red theraband 3 x 12 feet. Performed bilaterally. - Education on HEP    Pt  required multimodal cuing for proper technique and to facilitate improved neuromuscular control, strength, range of motion, and functional ability resulting in improved performance and form.   HOME EXERCISE PROGRAM Access Code: [email or web address] URL: [email or web address] Date: 02/23/2021 Prepared by: Rosita Kea  Exercises Supine Lower Trunk Rotation - 1 x daily - 3 sets - 10 reps Supine Double Knee to Chest - 1 x daily - 3 sets - 10 reps Prone Press Up On Elbows - 1 x daily - 1 sets - 10 reps - 10 seconds hold Seated Hamstring Stretch - 1 x daily - 3 reps - 30 sedonds hold    PT Education - 02/23/21 1002     Education Details pt educated on HEP, correct performance of new interventions, biomechanics, and benefits of exercise after expressing fear of pain with movement.    Person(s) Educated Patient    Methods Explanation;Demonstration;Tactile cues;Verbal cues    Comprehension Verbalized understanding;Returned demonstration;Verbal cues required;Tactile cues required              PT Short Term Goals - 02/23/21 1845       PT SHORT TERM GOAL #1   Title Pt will be independent with HEP in order to improve strength and decrease back pain in order to improve pain-free function at home.    Baseline 02/11/21: Given HEP at initial eval    Time 2    Period Weeks    Status Achieved    Target Date 02/25/21               PT Long Term Goals - 02/11/21 1405       PT LONG TERM GOAL #1   Title Patient will demonstrate improved function as evidenced by a score of 65 on FOTO measure for full participation in activities at home and in the community.    Baseline 02/11/21: FOTO - 52    Time 8    Period Weeks    Status New    Target Date 04/08/21      PT LONG TERM GOAL #2   Title Pt will decrease worst back pain as reported on NPRS by at least 2 points in order to demonstrate clinically significant reduction in back pain.    Baseline 02/11/21: Worst Pain reported 10/10    Time  8    Period Weeks    Status New    Target Date 04/08/21      PT LONG TERM GOAL #3   Title Pt will increase strength of B LE by at least 1/2 MMT grade in order to  demonstrate improvement in strength and function.    Baseline 02/11/21: B LE Generalized Strength 4+/5    Time 8    Period Weeks    Status New    Target Date 04/08/21                   Plan - 02/23/21 1025     Clinical Impression Statement Patient tolerated treatment well and was highly motivated during the session. Today's session emphasised LE, glute, and core strengtheing to improve functional mobility. LE strengthening was limited by limited L knee ROM and patient complaints of L knee pain. Patient also required education on benefits of exercise and strengthening for pain managment after expressing concerns about fear of pain with movement. Patient continues to benefit from skilled physical therapy to address functional impairments and decrease pain. Plan for additional assessment of L knee ROM, strength, and functional limitations to better understand impact on patient LE and back function and pain.    Personal Factors and Comorbidities Age;Comorbidity 3+    Comorbidities Breast Cancer, Osteoporosis, Diastasis Recti, Umbilical Hernia    Examination-Activity Limitations Carry;Lift;Sit;Squat;Stand;Transfers    Examination-Participation Restrictions Cleaning;Community Activity;Laundry;Meal Prep;Shop;Yard Work    Stability/Clinical Decision Making Stable/Uncomplicated    Rehab Potential Good    PT Frequency 2x / week    PT Duration 8 weeks    PT Treatment/Interventions ADLs/Self Care Home Management;Aquatic Therapy;Cryotherapy;Electrical Stimulation;Moist Heat;Traction;Ultrasound;DME Instruction;Gait training;Functional mobility training;Therapeutic activities;Therapeutic exercise;Balance training;Neuromuscular re-education;Passive range of motion;Dry needling;Taping;Spinal Manipulations;Joint Manipulations    PT Next  Visit Plan Assess HEP, perform manual therapy on lumbar region, specifically paraspinal for decreased tension/increased muscle extensibility.    PT Home Exercise Plan Access Code: [email or web address]  URL: [email or web address]  Date: 02/11/2021  Prepared by:  Nation    Exercises  Prone Press Up On Elbows - 1 x daily - 7 x weekly - 3 sets - 10 reps  Supine Lower Trunk Rotation - 1 x daily - 7 x weekly - 3 sets - 10 reps  Supine Double Knee to Chest - 1 x daily - 7 x weekly - 3 sets - 10 reps    Consulted and Agree with Plan of Care Patient             Patient will benefit from skilled therapeutic intervention in order to improve the following deficits and impairments:  Abnormal gait, Decreased activity tolerance, Decreased endurance, Decreased mobility, Decreased range of motion, Decreased strength, Difficulty walking, Hypomobility, Impaired flexibility, Pain  Visit Diagnosis: Chronic bilateral low back pain without sciatica  Impaired ambulation  Muscle weakness (generalized)     Problem List Patient Active Problem List   Diagnosis Date Noted   Constipation 11/06/2020   Right low back pain 11/06/2020   Dental infection 71/01/2693   Umbilical hernia 85/46/2703   Osteopenia 04/07/2020   Arthritis of left ankle 11/13/2019   Bilateral leg edema 11/06/2019   Left foot pain 11/06/2019   Hot flashes 11/06/2019   Stage 3 chronic kidney disease ([place]) 06/23/2019   Vitamin D deficiency 06/23/2019   Degenerative arthritis of knee 02/17/2019   Vertigo 02/17/2019   Toe pain, right 02/17/2019   Costochondritis 08/01/2017   Urge incontinence 03/16/2017   Allergic rhinitis 08/12/2016   Overweight 08/12/2016   Shortness of breath when humid outside 02/04/2016   History of thyroid nodule 02/04/2016   Anxiety 11/01/2015   Hyperlipidemia 07/30/2015   Essential hypertension 07/30/2015   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   Stiffness of left knee 07/30/2015   DCIS (ductal carcinoma in  situ)  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   Nancy Nordmann 02/23/2021, 6:47 PM  [facility] [address] [place], [address] Phone: [phone]   Fax:  [phone]  Name: Anneth Brunell MRN: [id number] Date of Birth: Jun 21, 1946

## 2021-02-25 ENCOUNTER — Ambulatory Visit: Payer: Medicare Other | Admitting: Physical Therapy

## 2021-02-25 ENCOUNTER — Other Ambulatory Visit: Payer: Self-pay

## 2021-02-25 ENCOUNTER — Encounter: Payer: Self-pay | Admitting: Physical Therapy

## 2021-02-25 DIAGNOSIS — M545 Low back pain, unspecified: Secondary | ICD-10-CM | POA: Diagnosis not present

## 2021-02-25 DIAGNOSIS — R262 Difficulty in walking, not elsewhere classified: Secondary | ICD-10-CM | POA: Diagnosis not present

## 2021-02-25 DIAGNOSIS — G8929 Other chronic pain: Secondary | ICD-10-CM

## 2021-02-25 DIAGNOSIS — M6281 Muscle weakness (generalized): Secondary | ICD-10-CM | POA: Diagnosis not present

## 2021-02-25 NOTE — Therapy (Signed)
[facility] [address], [place], [address] Phone: [phone]   Fax:  [phone]  Physical Therapy Treatment  Patient Details  Name: Lindsey Foster MRN: [id number] Date of Birth: [birth year] Referring Provider (PT): Lyndal Pulley, DO   Encounter Date: 02/25/2021   PT End of Session - 02/25/21 1050     Visit Number 4    Number of Visits 16    Date for PT Re-Evaluation 04/08/21    Authorization Type MEDICARE PART B Reporting period from 02/11/2021    PT Start Time 0903    PT Stop Time 0943    PT Time Calculation (min) 40 min    Activity Tolerance Patient tolerated treatment well    Behavior During Therapy [facility] for tasks assessed/performed             Past Medical History:  Diagnosis Date   Allergic rhinitis    Anxiety    panic attacks in hot rooms   Arthritis    Blood in stool    Breast cancer ([place]) 2006   radiation - Rt   Chickenpox    CKD (chronic kidney disease)    Colon polyps    Diverticulosis    GERD (gastroesophageal reflux disease)    Heart murmur    Hyperlipidemia    Hypertension    Hypothyroidism    Multiple thyroid nodules    Osteopenia    Personal history of radiation therapy    Urinary incontinence    UTI (lower urinary tract infection)    Vitamin D deficiency disease     Past Surgical History:  Procedure Laterality Date   BLADDER SURGERY     BREAST EXCISIONAL BIOPSY Right 2006   positive   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   CESAREAN SECTION  1985   COLONOSCOPY WITH PROPOFOL N/A 09/28/2015   Procedure: COLONOSCOPY WITH PROPOFOL;  Surgeon: Lollie Sails, MD;  Location: [facility] ENDOSCOPY;  Service: Endoscopy;  Laterality: N/A;   DILATION AND CURETTAGE OF UTERUS  2012   Thyroid nodule ablation  2003   Uterine polyp removal  2009     There were no vitals filed for this visit.   Subjective Assessment - 02/25/21 1045     Subjective Patient arrived to clinic  in 0/10 pain. Patient reports experiencing pain during their water aerobics class. Pain usually resolves after a few minutes. Patient expresses that she tends to push herself through pain and does not know when to stop. Patient states that they have been less compliant with HEP since starting water aerobics. They still perform the hamstring HEP stretch, but are less likely to perform supine exercises. Additionally, patient states that L knee slipped yesterday while walking so she is wearing a knee brace today. She mentions that L knee occationally slips and feels unstable when walking resulting in mild soreness. Patient states that she becomes more timid while walking after it gives out. Patient describes having knee problems for 30-40 years with the most recent flare up in 2015. She cannot recall a knee injury, but recalls having pain in her knee years ago after moving that resulted in ROM loss, swelling, and warmth in knee. She reports that she loves to walk on a 1 mile loop in the park and would like to get back to walking that loop. Currently she has a fear of not being able to finish the loop due to her knee.    Pertinent History  Hx of Breast Cancer, Osteoporosis, Diastasis Recti, Umbilical Hernia    Limitations Standing;Walking;House hold activities    How long can you sit comfortably? >1 hour    How long can you stand comfortably? >1 hour until she has to move    How long can you walk comfortably? Short distances without AD, community ambulation with AD's.    Patient Stated Goals To increase her mobility in her legs, and decrease pain in back when she moves.    Currently in Pain? No/denies    Pain Onset More than a month ago           .  OBJECTIVE L Knee ROM Assessment  Flexion: 92 degrees. Firm end feel. Extension: Lacking 9 degrees of extension. Firm end feel.  Measurements taken in supine  TREATMENT  Manual Therapy: to increase joint mobility, support increased overall ROM, and  provide pain relief to joint musculature.  -supine patellar medial/lateral/inferior/superior glides with towel supporting L knee 1 x 30 seconds each direction. Grade 2-4. Demonstrated hypomobility of the patella with all movements.  -supine patellar inferior glides with towel supporting knee 3x30 seconds to improve L knee flexion ROM. Grade 2-4. -hooklying tibiofemoral posterior glide 3x30 seconds to improve L knee flexion ROM. Grade 2-4.  Therapeutic exercise: to centralize symptoms and improve ROM, strength, muscular endurance, and activity tolerance required for successful completion of functional activities.   - Side stepping with UE support and red theraband 3 x 12 feet. Performed bilaterally. - Standing TRX squats 2 x 10, 1 x 5. Patient reported decreased knee stiffness and demonstrated increased knee ROM while performing squats. -Assessment of L knee ROM and dysfunction. See above.    Pt required multimodal cuing for proper technique and to facilitate improved neuromuscular control, strength, range of motion, and functional ability resulting in improved performance and form.    HOME EXERCISE PROGRAM Access Code: [email or web address] URL: [email or web address] Date: 02/23/2021 Prepared by: Rosita Kea   Exercises Supine Lower Trunk Rotation - 1 x daily - 3 sets - 10 reps Supine Double Knee to Chest - 1 x daily - 3 sets - 10 reps Prone Press Up On Elbows - 1 x daily - 1 sets - 10 reps - 10 seconds hold Seated Hamstring Stretch - 1 x daily - 3 reps - 30 seconds hold     PT Education - 02/25/21 1050     Education Details pt educated on HEP, correct performance of new interventions, biomechanics, and benefits of exercise after expressing fear of pain with movement.    Person(s) Educated Patient    Methods Explanation;Demonstration;Tactile cues;Verbal cues    Comprehension Verbalized understanding;Verbal cues required;Returned demonstration;Tactile cues required               PT Short Term Goals - 02/23/21 1845       PT SHORT TERM GOAL #1   Title Pt will be independent with HEP in order to improve strength and decrease back pain in order to improve pain-free function at home.    Baseline 02/11/21: Given HEP at initial eval    Time 2    Period Weeks    Status Achieved    Target Date 02/25/21               PT Long Term Goals - 02/11/21 1405       PT LONG TERM GOAL #1   Title Patient will demonstrate improved function as evidenced by a score of 65 on FOTO measure for full participation  in activities at home and in the community.    Baseline 02/11/21: FOTO - 52    Time 8    Period Weeks    Status New    Target Date 04/08/21      PT LONG TERM GOAL #2   Title Pt will decrease worst back pain as reported on NPRS by at least 2 points in order to demonstrate clinically significant reduction in back pain.    Baseline 02/11/21: Worst Pain reported 10/10    Time 8    Period Weeks    Status New    Target Date 04/08/21      PT LONG TERM GOAL #3   Title Pt will increase strength of B LE by at least 1/2 MMT grade in order to demonstrate improvement in strength and function.    Baseline 02/11/21: B LE Generalized Strength 4+/5    Time 8    Period Weeks    Status New    Target Date 04/08/21                   Plan - 02/25/21 1221     Clinical Impression Statement Patient tolerated treatment well with no lasting complaints of pain or discomfort. Session focused on LE strengthening and assessment of L knee ROM to further determine how L knee dysfunction may be contributing to condition and functional mobility deficits. Left knee appears to have longstanding flexion contraction with history of osteoarthritis and may benefit from further manual and exercise interventtions targeting the left knee to improve mobility and strength and reduce compensations.  Patient continues to require multimodal cuing to perform exercises correctly, but the patient  demonstrates high engagement in therapy session and carryover of patient education with some self correction and cuing. Left knee pain and hypomobility continue to limit patient function. However, manual therapy and theraputic interventions improved patient L knee stiffness and ROM per patient report. Patient will continue to benefit from skilled physical therapy to address impairments and maximize functional mobility.    Personal Factors and Comorbidities Age;Comorbidity 3+    Comorbidities Breast Cancer, Osteoporosis, Diastasis Recti, Umbilical Hernia    Examination-Activity Limitations Carry;Lift;Sit;Squat;Stand;Transfers    Examination-Participation Restrictions Cleaning;Community Activity;Laundry;Meal Prep;Shop;Yard Work    Stability/Clinical Decision Making Stable/Uncomplicated    Rehab Potential Good    PT Frequency 2x / week    PT Duration 8 weeks    PT Treatment/Interventions ADLs/Self Care Home Management;Aquatic Therapy;Cryotherapy;Electrical Stimulation;Moist Heat;Traction;Ultrasound;DME Instruction;Gait training;Functional mobility training;Therapeutic activities;Therapeutic exercise;Balance training;Neuromuscular re-education;Passive range of motion;Dry needling;Taping;Spinal Manipulations;Joint Manipulations    PT Next Visit Plan Assess HEP, perform manual therapy on lumbar region, specifically paraspinal for decreased tension/increased muscle extensibility.    PT Home Exercise Plan Access Code: [email or web address]  URL: [email or web address]  Date: 02/11/2021  Prepared by: He[place] Nation    Exercises  Prone Press Up On Elbows - 1 x daily - 7 x weekly - 3 sets - 10 reps  Supine Lower Trunk Rotation - 1 x daily - 7 x weekly - 3 sets - 10 reps  Supine Double Knee to Chest - 1 x daily - 7 x weekly - 3 sets - 10 reps    Consulted and Agree with Plan of Care Patient             Patient will benefit from skilled therapeutic intervention in order to improve the following deficits and  impairments:  Abnormal gait, Decreased activity tolerance, Decreased endurance, Decreased mobility, Decreased range of motion, Decreased strength, Difficulty walking, Hypomobility,  Impaired flexibility, Pain  Visit Diagnosis: Chronic bilateral low back pain without sciatica  Impaired ambulation  Muscle weakness (generalized)     Problem List Patient Active Problem List   Diagnosis Date Noted   Constipation 11/06/2020   Right low back pain 11/06/2020   Dental infection 16/83/7290   Umbilical hernia 21/06/5519   Osteopenia 04/07/2020   Arthritis of left ankle 11/13/2019   Bilateral leg edema 11/06/2019   Left foot pain 11/06/2019   Hot flashes 11/06/2019   Stage 3 chronic kidney disease ([place]) 06/23/2019   Vitamin D deficiency 06/23/2019   Degenerative arthritis of knee 02/17/2019   Vertigo 02/17/2019   Toe pain, right 02/17/2019   Costochondritis 08/01/2017   Urge incontinence 03/16/2017   Allergic rhinitis 08/12/2016   Overweight 08/12/2016   Shortness of breath when humid outside 02/04/2016   History of thyroid nodule 02/04/2016   Anxiety 11/01/2015   Hyperlipidemia 07/30/2015   Essential hypertension 07/30/2015   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   Stiffness of left knee 07/30/2015   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   Osteoporosis 07/20/2015   GERD (gastroesophageal reflux disease) 06/30/2015   Eczema 06/30/2015   Blood in stool 06/30/2015   Muscle cramping 06/30/2015   Melena 06/30/2015   Kidney disease 07/07/2014   Nonexertional chest pain 07/07/2014   Palpitation 07/07/2014    Lillie Columbia, SPT  Student physical therapist under direct supervision of licensed physical therapists during the entirety of the session.   Everlean Alstrom. Graylon Good, PT, DPT 02/25/21, 1:37 PM  [facility] [address], [place], [address] Phone: [phone]   Fax:  [phone]  Name: Lindsey Foster MRN:  [id number] Date of Birth: [birth year]

## 2021-03-02 ENCOUNTER — Encounter: Payer: Self-pay | Admitting: Physical Therapy

## 2021-03-02 ENCOUNTER — Ambulatory Visit: Payer: Medicare Other | Admitting: Physical Therapy

## 2021-03-02 DIAGNOSIS — M545 Low back pain, unspecified: Secondary | ICD-10-CM | POA: Diagnosis not present

## 2021-03-02 DIAGNOSIS — R262 Difficulty in walking, not elsewhere classified: Secondary | ICD-10-CM | POA: Diagnosis not present

## 2021-03-02 DIAGNOSIS — G8929 Other chronic pain: Secondary | ICD-10-CM | POA: Diagnosis not present

## 2021-03-02 DIAGNOSIS — M6281 Muscle weakness (generalized): Secondary | ICD-10-CM | POA: Diagnosis not present

## 2021-03-02 NOTE — Therapy (Signed)
[facility] [address] [place], [place], [address] Phone: [phone]   Fax:  [phone]  Physical Therapy Treatment  Patient Details  Name: Lindsey Foster MRN: [id number] Date of Birth: [birth year] Referring Provider (PT): Lyndal Pulley, DO   Encounter Date: 03/02/2021   PT End of Session - 03/02/21 1020     Visit Number 5    Number of Visits 16    Date for PT Re-Evaluation 04/08/21    Authorization Type MEDICARE PART B Reporting period from 02/11/2021    PT Start Time 0907    PT Stop Time 0953    PT Time Calculation (min) 46 min    Activity Tolerance Patient tolerated treatment well    Behavior During Therapy [facility] for tasks assessed/performed             Past Medical History:  Diagnosis Date   Allergic rhinitis    Anxiety    panic attacks in hot rooms   Arthritis    Blood in stool    Breast cancer ([place]) 2006   radiation - Rt   Chickenpox    CKD (chronic kidney disease)    Colon polyps    Diverticulosis    GERD (gastroesophageal reflux disease)    Heart murmur    Hyperlipidemia    Hypertension    Hypothyroidism    Multiple thyroid nodules    Osteopenia    Personal history of radiation therapy    Urinary incontinence    UTI (lower urinary tract infection)    Vitamin D deficiency disease     Past Surgical History:  Procedure Laterality Date   BLADDER SURGERY     BREAST EXCISIONAL BIOPSY Right 2006   positive   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   CESAREAN SECTION  1985   COLONOSCOPY WITH PROPOFOL N/A 09/28/2015   Procedure: COLONOSCOPY WITH PROPOFOL;  Surgeon: Lollie Sails, MD;  Location: [facility] ENDOSCOPY;  Service: Endoscopy;  Laterality: N/A;   DILATION AND CURETTAGE OF UTERUS  2012   Thyroid nodule ablation  2003   Uterine polyp removal  2009     There were no vitals filed for this visit.  TREATMENT  Subjective Assessment - 03/02/21 1018     Subjective Patient reports  having good days and bad days, but believes that she is getting better with PT. Patient does not experience any pain while sitting/resting, but notes 1-2/10 pain when walking/moving. She believe that her HEP is helping. The exercises seem to be "pulling at the right muscles."    Pertinent History Hx of Breast Cancer, Osteoporosis, Diastasis Recti, Umbilical Hernia    Limitations Standing;Walking;House hold activities    How long can you sit comfortably? >1 hour    How long can you stand comfortably? >1 hour until she has to move    How long can you walk comfortably? Short distances without AD, community ambulation with AD's.    Patient Stated Goals To increase her mobility in her legs, and decrease pain in back when she moves.    Currently in Pain? Yes    Pain Score 2     Pain Onset More than a month ago            TREATMENT:   Manual Therapy: to increase joint mobility, support increased overall ROM, and provide pain relief to joint musculature.  - supine patellar medial/lateral/inferior/superior glides with towel supporting L knee 3 x 30 seconds each  direction. Grade 2-4. Demonstrated hypomobility of the patella with all movements.  - supine tibiofemoral posterior glide with foam rollar supporting thigh 3x30 seconds to improve L knee flexion ROM. Grade 2-4. - hooklying tibiofemoral anterior glides with arm underneath L knee and supported on right knee to improve L knee flexion ROM. Grade 2-4. ~2x30 seconds.  - supine L knee flexion stretch 10 x 5 second hold, patient right knee in hooklying, left knee in 90/90 position with PT providing knee flexion stretch  Therapeutic exercise: to centralize symptoms and improve ROM, strength, muscular endurance, and activity tolerance required for successful completion of functional activities.   - supine heel slide with strap 5 x 5 seconds hold - Seated straight leg patient self performed inferior, lateral, medial, and superior patellar mobilization  2x30 seconds each direction. Grade 2-4.  - Patient HEP education and instruction - Standing squat with UE support from sink 2x10    Pt required multimodal cuing for proper technique and to facilitate improved neuromuscular control, strength, range of motion, and functional ability resulting in improved performance and form.   HOME EXERCISE PROGRAM Access Code: [email or web address] URL: [email or web address] Date: 03/02/2021 Prepared by: Rosita Kea  Exercises Supine Heel Slide with Strap - 1-2 x daily - 20 reps - 5 seconds hold 4 Way Patellar Glide - 1-2 x weekly - 3 sets - 30 hold Squat with Counter Support - 2 x weekly - 2 sets - 10 reps    PT Education - 03/02/21 1019     Education Details pt educated on HEP, correct performance of new interventions, biomechanics, self patellar mobilization    Person(s) Educated Patient    Methods Handout;Demonstration;Explanation;Tactile cues;Verbal cues    Comprehension Verbalized understanding;Tactile cues required;Returned demonstration;Verbal cues required              PT Short Term Goals - 02/23/21 1845       PT SHORT TERM GOAL #1   Title Pt will be independent with HEP in order to improve strength and decrease back pain in order to improve pain-free function at home.    Baseline 02/11/21: Given HEP at initial eval    Time 2    Period Weeks    Status Achieved    Target Date 02/25/21               PT Long Term Goals - 02/11/21 1405       PT LONG TERM GOAL #1   Title Patient will demonstrate improved function as evidenced by a score of 65 on FOTO measure for full participation in activities at home and in the community.    Baseline 02/11/21: FOTO - 52    Time 8    Period Weeks    Status New    Target Date 04/08/21      PT LONG TERM GOAL #2   Title Pt will decrease worst back pain as reported on NPRS by at least 2 points in order to demonstrate clinically significant reduction in back pain.    Baseline 02/11/21:  Worst Pain reported 10/10    Time 8    Period Weeks    Status New    Target Date 04/08/21      PT LONG TERM GOAL #3   Title Pt will increase strength of B LE by at least 1/2 MMT grade in order to demonstrate improvement in strength and function.    Baseline 02/11/21: B LE Generalized Strength 4+/5    Time 8    Period  Weeks    Status New    Target Date 04/08/21               Plan - 03/02/21 1031     Clinical Impression Statement Patient tolerated treatment well with no lasting pain or discomfort. Patient L knee ROM continues to limit patient functional mobility, activity tolerance, and plan of care. However, patient continues to report improved L knee ROM and stiffness after manual work and therapuetic exercises. Future sessions should continue to address impaired hip/LE strength which contribute to patient low back pain and impaired mobility. Patient will continue to benefit from skilled physical therapy services to address impairments and maximize functional mobility.    Personal Factors and Comorbidities Age;Comorbidity 3+    Comorbidities Breast Cancer, Osteoporosis, Diastasis Recti, Umbilical Hernia    Examination-Activity Limitations Carry;Lift;Sit;Squat;Stand;Transfers    Examination-Participation Restrictions Cleaning;Community Activity;Laundry;Meal Prep;Shop;Yard Work    Stability/Clinical Decision Making Stable/Uncomplicated    Rehab Potential Good    PT Frequency 2x / week    PT Duration 8 weeks    PT Treatment/Interventions ADLs/Self Care Home Management;Aquatic Therapy;Cryotherapy;Electrical Stimulation;Moist Heat;Traction;Ultrasound;DME Instruction;Gait training;Functional mobility training;Therapeutic activities;Therapeutic exercise;Balance training;Neuromuscular re-education;Passive range of motion;Dry needling;Taping;Spinal Manipulations;Joint Manipulations    PT Next Visit Plan Assess HEP, perform manual therapy on lumbar region, specifically paraspinal for decreased  tension/increased muscle extensibility.    PT Home Exercise Plan Access Code: [email or web address]  URL: [email or web address]  Date: 02/11/2021  Prepared by: [place]    Exercises  Prone Press Up On Elbows - 1 x daily - 7 x weekly - 3 sets - 10 reps  Supine Lower Trunk Rotation - 1 x daily - 7 x weekly - 3 sets - 10 reps  Supine Double Knee to Chest - 1 x daily - 7 x weekly - 3 sets - 10 reps    Consulted and Agree with Plan of Care Patient             Patient will benefit from skilled therapeutic intervention in order to improve the following deficits and impairments:  Abnormal gait, Decreased activity tolerance, Decreased endurance, Decreased mobility, Decreased range of motion, Decreased strength, Difficulty walking, Hypomobility, Impaired flexibility, Pain  Visit Diagnosis: Chronic bilateral low back pain without sciatica  Impaired ambulation  Muscle weakness (generalized)     Problem List Patient Active Problem List   Diagnosis Date Noted   Constipation 11/06/2020   Right low back pain 11/06/2020   Dental infection 55/73/2202   Umbilical hernia 54/27/0623   Osteopenia 04/07/2020   Arthritis of left ankle 11/13/2019   Bilateral leg edema 11/06/2019   Left foot pain 11/06/2019   Hot flashes 11/06/2019   Stage 3 chronic kidney disease ([place]) 06/23/2019   Vitamin D deficiency 06/23/2019   Degenerative arthritis of knee 02/17/2019   Vertigo 02/17/2019   Toe pain, right 02/17/2019   Costochondritis 08/01/2017   Urge incontinence 03/16/2017   Allergic rhinitis 08/12/2016   Overweight 08/12/2016   Shortness of breath when humid outside 02/04/2016   History of thyroid nodule 02/04/2016   Anxiety 11/01/2015   Hyperlipidemia 07/30/2015   Essential hypertension 07/30/2015   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   Stiffness of left knee 07/30/2015   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    Everlean Alstrom. Graylon Good, PT, DPT 03/02/21, 12:00 PM   [facility] [address], [place], [address] Phone: [phone]   Fax:  [phone]  Name: Lindsey Foster MRN: [id number] Date of Birth: Feb 07, 1946

## 2021-03-04 ENCOUNTER — Encounter: Payer: Self-pay | Admitting: Physical Therapy

## 2021-03-04 ENCOUNTER — Ambulatory Visit: Payer: Medicare Other | Admitting: Physical Therapy

## 2021-03-04 DIAGNOSIS — M545 Low back pain, unspecified: Secondary | ICD-10-CM | POA: Diagnosis not present

## 2021-03-04 DIAGNOSIS — G8929 Other chronic pain: Secondary | ICD-10-CM | POA: Diagnosis not present

## 2021-03-04 DIAGNOSIS — M6281 Muscle weakness (generalized): Secondary | ICD-10-CM

## 2021-03-04 DIAGNOSIS — R262 Difficulty in walking, not elsewhere classified: Secondary | ICD-10-CM

## 2021-03-04 NOTE — Therapy (Signed)
[facility] [address] [place], [place], [address] Phone: [phone]   Fax:  [phone]  Physical Therapy Treatment  Patient Details  Name: Lindsey Foster MRN: [id number] Date of Birth: 05/22/1946 Referring Provider (PT): Lyndal Pulley, DO   Encounter Date: 03/04/2021   PT End of Session - 03/04/21 1012     Visit Number 6    Number of Visits 16    Date for PT Re-Evaluation 04/08/21    Authorization Type MEDICARE PART B Reporting period from 02/11/2021    PT Start Time 0903    PT Stop Time 0943    PT Time Calculation (min) 40 min    Activity Tolerance Patient tolerated treatment well    Behavior During Therapy [facility] for tasks assessed/performed             Past Medical History:  Diagnosis Date   Allergic rhinitis    Anxiety    panic attacks in hot rooms   Arthritis    Blood in stool    Breast cancer ([place]) 2006   radiation - Rt   Chickenpox    CKD (chronic kidney disease)    Colon polyps    Diverticulosis    GERD (gastroesophageal reflux disease)    Heart murmur    Hyperlipidemia    Hypertension    Hypothyroidism    Multiple thyroid nodules    Osteopenia    Personal history of radiation therapy    Urinary incontinence    UTI (lower urinary tract infection)    Vitamin D deficiency disease     Past Surgical History:  Procedure Laterality Date   BLADDER SURGERY     BREAST EXCISIONAL BIOPSY Right 2006   positive   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   CESAREAN SECTION  1985   COLONOSCOPY WITH PROPOFOL N/A 09/28/2015   Procedure: COLONOSCOPY WITH PROPOFOL;  Surgeon: Lollie Sails, MD;  Location: [facility] ENDOSCOPY;  Service: Endoscopy;  Laterality: N/A;   DILATION AND CURETTAGE OF UTERUS  2012   Thyroid nodule ablation  2003   Uterine polyp removal  2009     There were no vitals filed for this visit.   OBJECTIVE  FOTO: 61 (03/04/2021)  TREATMENT:    Therapeutic exercise: to  centralize symptoms and improve ROM, strength, muscular endurance, and activity tolerance required for successful completion of functional activities.   - Lateral Step ups with 4" step and  UE assistance from plinth table 2x10, performed bilaterally - Step ups with with 4" step and UE assistance 2x10, performed bilaterally. Patient reports 8/10 pain in L knee with exercises.  - Seated Hamstring Stretch - 3 reps - 30 seconds hold - Supine Lower Trunk Rotation - 3 sets - 10 reps - Standing marches with UE support 1 x 10  - Standing marches with 3lb ankle weights and UE support 2x10 performed bilaterally - Standing TRX squats 3 x 10.  Patient reported decreased knee stiffness and demonstrated increased knee ROM while performing squats.    Pt required multimodal cuing for proper technique and to facilitate improved neuromuscular control, strength, range of motion, and functional ability resulting in improved performance and form.   PT Education - 03/04/21 1012     Education Details pt educated on HEP, correct performance of new interventions, biomechanics    Person(s) Educated Patient    Methods Explanation;Demonstration;Verbal cues;Tactile cues    Comprehension Verbalized understanding;Verbal cues required;Returned demonstration;Tactile cues required  PT Short Term Goals - 02/23/21 1845       PT SHORT TERM GOAL #1   Title Pt will be independent with HEP in order to improve strength and decrease back pain in order to improve pain-free function at home.    Baseline 02/11/21: Given HEP at initial eval    Time 2    Period Weeks    Status Achieved    Target Date 02/25/21               PT Long Term Goals - 02/11/21 1405       PT LONG TERM GOAL #1   Title Patient will demonstrate improved function as evidenced by a score of 65 on FOTO measure for full participation in activities at home and in the community.    Baseline 02/11/21: FOTO - 52    Time 8    Period Weeks     Status New    Target Date 04/08/21      PT LONG TERM GOAL #2   Title Pt will decrease worst back pain as reported on NPRS by at least 2 points in order to demonstrate clinically significant reduction in back pain.    Baseline 02/11/21: Worst Pain reported 10/10    Time 8    Period Weeks    Status New    Target Date 04/08/21      PT LONG TERM GOAL #3   Title Pt will increase strength of B LE by at least 1/2 MMT grade in order to demonstrate improvement in strength and function.    Baseline 02/11/21: B LE Generalized Strength 4+/5    Time 8    Period Weeks    Status New    Target Date 04/08/21                   Plan - 03/04/21 1015     Clinical Impression Statement Patient tolerated treatment well with no lasting increase in pain or discomfort by end of session. Patient L knee pain and impaired strength limited patient ability to to perform step up exercise today with her L knee buckling and pain increasing to 8/10 but decreased back to baseline with modification of exercise position and load. Patient L knee also buckles while ambulating and squatting, which limits patient safety and increases patient risk of falls. Patient remains limited in ability to stand for long periods of time and would benefit from continued encorporation of  LE and back extensor strength exercises into subsequent sessions. However, patient updated FOTO score indicates significant improvements in patient reported function since starting PT. Patient will continue to benefit from skilled phyiscal therapy services to address impairments and maximize functional mobility.    Personal Factors and Comorbidities Age;Comorbidity 3+    Comorbidities Breast Cancer, Osteoporosis, Diastasis Recti, Umbilical Hernia    Examination-Activity Limitations Carry;Lift;Sit;Squat;Stand;Transfers    Examination-Participation Restrictions Cleaning;Community Activity;Laundry;Meal Prep;Shop;Yard Work    Stability/Clinical Decision  Making Stable/Uncomplicated    Rehab Potential Good    PT Frequency 2x / week    PT Duration 8 weeks    PT Treatment/Interventions ADLs/Self Care Home Management;Aquatic Therapy;Cryotherapy;Electrical Stimulation;Moist Heat;Traction;Ultrasound;DME Instruction;Gait training;Functional mobility training;Therapeutic activities;Therapeutic exercise;Balance training;Neuromuscular re-education;Passive range of motion;Dry needling;Taping;Spinal Manipulations;Joint Manipulations    PT Next Visit Plan Assess HEP, perform manual therapy on lumbar region, specifically paraspinal for decreased tension/increased muscle extensibility.    PT Home Exercise Plan Access Code: [email or web address]  URL: [email or web address]  Date: 02/11/2021  Prepared by: Bibb Nation  Exercises  Prone Press Up On Elbows - 1 x daily - 7 x weekly - 3 sets - 10 reps  Supine Lower Trunk Rotation - 1 x daily - 7 x weekly - 3 sets - 10 reps  Supine Double Knee to Chest - 1 x daily - 7 x weekly - 3 sets - 10 reps    Consulted and Agree with Plan of Care Patient             Patient will benefit from skilled therapeutic intervention in order to improve the following deficits and impairments:  Abnormal gait, Decreased activity tolerance, Decreased endurance, Decreased mobility, Decreased range of motion, Decreased strength, Difficulty walking, Hypomobility, Impaired flexibility, Pain  Visit Diagnosis: Chronic bilateral low back pain without sciatica  Impaired ambulation  Muscle weakness (generalized)     Problem List Patient Active Problem List   Diagnosis Date Noted   Constipation 11/06/2020   Right low back pain 11/06/2020   Dental infection 67/61/9509   Umbilical hernia 32/67/1245   Osteopenia 04/07/2020   Arthritis of left ankle 11/13/2019   Bilateral leg edema 11/06/2019   Left foot pain 11/06/2019   Hot flashes 11/06/2019   Stage 3 chronic kidney disease ([place]) 06/23/2019   Vitamin D deficiency 06/23/2019    Degenerative arthritis of knee 02/17/2019   Vertigo 02/17/2019   Toe pain, right 02/17/2019   Costochondritis 08/01/2017   Urge incontinence 03/16/2017   Allergic rhinitis 08/12/2016   Overweight 08/12/2016   Shortness of breath when humid outside 02/04/2016   History of thyroid nodule 02/04/2016   Anxiety 11/01/2015   Hyperlipidemia 07/30/2015   Essential hypertension 07/30/2015   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   Stiffness of left knee 07/30/2015   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   Everlean Alstrom. Graylon Good, PT, DPT 03/04/21, 4:02 PM   [facility] [address], [place], [address] Phone: [phone]   Fax:  [phone]  Name: Salah Burlison MRN: [id number] Date of Birth: January 26, 1946

## 2021-03-09 ENCOUNTER — Ambulatory Visit: Payer: Medicare Other | Admitting: Physical Therapy

## 2021-03-09 ENCOUNTER — Encounter: Payer: Self-pay | Admitting: Physical Therapy

## 2021-03-09 DIAGNOSIS — R262 Difficulty in walking, not elsewhere classified: Secondary | ICD-10-CM

## 2021-03-09 DIAGNOSIS — G8929 Other chronic pain: Secondary | ICD-10-CM

## 2021-03-09 DIAGNOSIS — M6281 Muscle weakness (generalized): Secondary | ICD-10-CM | POA: Diagnosis not present

## 2021-03-09 DIAGNOSIS — M545 Low back pain, unspecified: Secondary | ICD-10-CM | POA: Diagnosis not present

## 2021-03-09 NOTE — Therapy (Signed)
[place] [facility] [address] [place], [place], [address] Phone: [phone]   Fax:  [phone]  Physical Therapy Treatment / Progress Note Dates of reporting: 02/11/2021 - 03/09/2021  Patient Details  Name: Lindsey Foster MRN: [id number] Date of Birth: July 19, 1946 Referring Provider (PT): Judi Saa, DO   Encounter Date: 03/09/2021   PT End of Session - 03/09/21 1300     Visit Number 7    Number of Visits 16    Date for PT Re-Evaluation 04/08/21    Authorization Type MEDICARE PART B Reporting period from 02/11/2021    PT Start Time 0900    PT Stop Time 0938    PT Time Calculation (min) 38 min    Activity Tolerance Patient tolerated treatment well    Behavior During Therapy [facility] for tasks assessed/performed             Past Medical History:  Diagnosis Date   Allergic rhinitis    Anxiety    panic attacks in hot rooms   Arthritis    Blood in stool    Breast cancer (HCC) 2006   radiation - Rt   Chickenpox    CKD (chronic kidney disease)    Colon polyps    Diverticulosis    GERD (gastroesophageal reflux disease)    Heart murmur    Hyperlipidemia    Hypertension    Hypothyroidism    Multiple thyroid nodules    Osteopenia    Personal history of radiation therapy    Urinary incontinence    UTI (lower urinary tract infection)    Vitamin D deficiency disease     Past Surgical History:  Procedure Laterality Date   BLADDER SURGERY     BREAST EXCISIONAL BIOPSY Right 2006   positive   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   CESAREAN SECTION  1985   COLONOSCOPY WITH PROPOFOL N/A 09/28/2015   Procedure: COLONOSCOPY WITH PROPOFOL;  Surgeon: Christena Deem, MD;  Location: [facility] ENDOSCOPY;  Service: Endoscopy;  Laterality: N/A;   DILATION AND CURETTAGE OF UTERUS  2012   Thyroid nodule ablation  2003   Uterine polyp removal  2009     There were no vitals filed for this visit.   Subjective Assessment  - 03/09/21 1233     Subjective Patient states that she has been using the L knee brace less lately. She reports not noticing her L knee bothering her without the brace, "so it much be getting better". Patient also mentions that she will not make it to her Thursday appointment, because she is meeting with her DO. Patient states that DO will determine if continuation of PT is necessary at that appointment, so today might be her last PT session. Patient states that her low back pain is almost completely gone. She only experiences 1/10 pain when her back gets tired after standing/weightbearing for awhile. Patient states that she experiences 3/10 bilateral LE pain and that her L knee limits her the most currently. She is concerned about favoring L knee and that negatively impacting low back pain. Patient states that she would like to continue with PT to address L knee impairments.    Pertinent History Hx of Breast Cancer, Osteoporosis, Diastasis Recti, Umbilical Hernia    Limitations Standing;Walking;House hold activities    How long can you sit comfortably? >1 hour    How long can you stand comfortably? >1 hour until she has to move  How long can you walk comfortably? Short distances without AD, community ambulation with AD's.    Patient Stated Goals To increase her mobility in her legs, and decrease pain in back when she moves.    Pain Score 3     Pain Onset More than a month ago              OBJECTIVE:   FUNCTIONAL ASSESSMENT:  FOTO: 63  Strength (out of 5) R/L 4+/4 Hip flexion.  5/5  Knee extension 5/5 Knee flexion.    TREATMENT:  Therapeutic exercise: to centralize symptoms and improve ROM, strength, muscular endurance, and activity tolerance required for successful completion of functional activities.   - Objective measurements completed on examination: See above findings.   - standing marches with 4lb ankle weights and UE support 3x10 performed bilaterally - standing TRX squats  3 x 12.  - standing rows with 10lb cable 3 x 10  - lat pull downs with cable, 1x10 with 10lb cable, 1x10 with 15lb cable, 1x20 with 25lb cable   Pt required multimodal cuing for proper technique and to facilitate improved neuromuscular control, strength, range of motion, and functional ability resulting in improved performance and form.    HOME EXERCISE PLAN Access Code: [email or web address] URL: [email or web address] Date: 03/09/2021 Prepared by: Rosita Kea  Exercises Supine Heel Slide with Strap - 1-2 x daily - 20 reps - 5 seconds hold 4 Way Patellar Glide - 1-2 x weekly - 3 sets - 30 hold Squat with Counter Support - 2 x weekly - 2 sets - 10 reps      PT Education - 03/09/21 1300     Education Details pt educated on HEP, correct performance of new interventions, biomechanics    Person(s) Educated Patient    Methods Explanation;Demonstration;Tactile cues;Verbal cues    Comprehension Verbalized understanding;Verbal cues required;Returned demonstration;Tactile cues required              PT Short Term Goals - 02/23/21 1845       PT SHORT TERM GOAL #1   Title Pt will be independent with HEP in order to improve strength and decrease back pain in order to improve pain-free function at home.    Baseline 02/11/21: Given HEP at initial eval    Time 2    Period Weeks    Status Achieved    Target Date 02/25/21               PT Long Term Goals - 03/09/21 1302       PT LONG TERM GOAL #1   Title Patient will demonstrate improved function as evidenced by a score of 65 on FOTO measure for full participation in activities at home and in the community.    Baseline 52 (02/11/21): 63 (03/09/2021):    Time 8    Period Weeks    Status Partially Met      PT LONG TERM GOAL #2   Title Pt will decrease worst back pain as reported on NPRS by at least 2 points in order to demonstrate clinically significant reduction in back pain.    Baseline 02/11/21: Worst Pain reported 10/10.  03/09/2021: Patient reports 1/10 pain after prolongued standing.    Time 8    Period Weeks    Status Achieved      PT LONG TERM GOAL #3   Title Pt will increase strength of B LE by at least 1/2 MMT grade in order to demonstrate improvement in strength and function.  Baseline 02/11/21: B LE Generalized Strength 4+/5, 03/05/2021: Bilateral knee flexion/extension 5/5    Time 8    Period Weeks    Status Partially Met                   Plan - 03/09/21 1445     Clinical Impression Statement Patient tolerated treatment well and is making progress towards goals. Patient made significant progress in self reportedparticipation in activities at home and in the community as evidence by FOTO score. Patient low back pain has also significantly decreased since starting PT. Alternately, patient has made objective improvements in bilateral knee extension an knee flexion muscle strength. However, patient functional mobility continues to be most limited by L knee ROM and functional strength. Patient would benefit from further assement of L to address knee impairments, which impact patient ability to stand, walk, perform house hold activities. Patient will continue to benefit from skilled physical therapy service to address impairments and maximize functional mobility.    Personal Factors and Comorbidities Age;Comorbidity 3+    Comorbidities Breast Cancer, Osteoporosis, Diastasis Recti, Umbilical Hernia    Examination-Activity Limitations Carry;Lift;Sit;Squat;Stand;Transfers    Examination-Participation Restrictions Cleaning;Community Activity;Laundry;Meal Prep;Shop;Yard Work    Stability/Clinical Decision Making Stable/Uncomplicated    Rehab Potential Good    PT Frequency 2x / week    PT Duration 8 weeks    PT Treatment/Interventions ADLs/Self Care Home Management;Aquatic Therapy;Cryotherapy;Electrical Stimulation;Moist Heat;Traction;Ultrasound;DME Instruction;Gait training;Functional mobility  training;Therapeutic activities;Therapeutic exercise;Balance training;Neuromuscular re-education;Passive range of motion;Dry needling;Taping;Spinal Manipulations;Joint Manipulations    PT Next Visit Plan Assess HEP, perform manual therapy on lumbar region, specifically paraspinal for decreased tension/increased muscle extensibility.    PT Home Exercise Plan Access Code: [email or web address]  URL: [email or web address]  Date: 02/11/2021  Prepared by: Tomasa Hose    Exercises  Prone Press Up On Elbows - 1 x daily - 7 x weekly - 3 sets - 10 reps  Supine Lower Trunk Rotation - 1 x daily - 7 x weekly - 3 sets - 10 reps  Supine Double Knee to Chest - 1 x daily - 7 x weekly - 3 sets - 10 reps    Consulted and Agree with Plan of Care Patient             Patient will benefit from skilled therapeutic intervention in order to improve the following deficits and impairments:  Abnormal gait, Decreased activity tolerance, Decreased endurance, Decreased mobility, Decreased range of motion, Decreased strength, Difficulty walking, Hypomobility, Impaired flexibility, Pain  Visit Diagnosis: Chronic bilateral low back pain without sciatica  Impaired ambulation  Muscle weakness (generalized)     Problem List Patient Active Problem List   Diagnosis Date Noted   Constipation 11/06/2020   Right low back pain 11/06/2020   Dental infection 05/09/2020   Umbilical hernia 05/09/2020   Osteopenia 04/07/2020   Arthritis of left ankle 11/13/2019   Bilateral leg edema 11/06/2019   Left foot pain 11/06/2019   Hot flashes 11/06/2019   Stage 3 chronic kidney disease (HCC) 06/23/2019   Vitamin D deficiency 06/23/2019   Degenerative arthritis of knee 02/17/2019   Vertigo 02/17/2019   Toe pain, right 02/17/2019   Costochondritis 08/01/2017   Urge incontinence 03/16/2017   Allergic rhinitis 08/12/2016   Overweight 08/12/2016   Shortness of breath when humid outside 02/04/2016   History of thyroid nodule  02/04/2016   Anxiety 11/01/2015   Hyperlipidemia 07/30/2015   Essential hypertension 07/30/2015   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   Stiffness of left knee 07/30/2015  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   Everlean Alstrom. Graylon Good, PT, DPT 03/09/21, 4:58 PM   [place] PHYSICAL AND SPORTS MEDICINE [address], [place], [address] Phone: [phone]   Fax:  [phone]  Name: Jeniffer Culliver MRN: [id number] Date of Birth: Dec 28, 1945

## 2021-03-10 NOTE — Progress Notes (Signed)
[name] [address] [place] [address] Phone: [phone] Subjective:   Lindsey Foster, am serving as a scribe for Dr. Antoine Primas. This visit occurred during the SARS-CoV-2 public health emergency.  Safety protocols were in place, including screening questions prior to the visit, additional usage of staff PPE, and extensive cleaning of exam room while observing appropriate contact time as indicated for disinfecting solutions.   I'm seeing this patient by the request  of:  [name], MD  CC: Low back and knee pain follow-up  [id number]  01/25/2021 Seems to be more secondary to the sacroiliac joint.  Discussed with patient that there is some very intermittent radicular symptoms that is somewhat concerning.  We will get x-rays to further evaluate with patient having a history of breast cancer and osteoporosis to rule out such things as an occult fracture.  Patient has responded well to conservative therapy previously.  We will hold on medication secondary to patient's other comorbidities.  Patient will follow up with me again in 6 to 8 weeks  Update 03/11/2021 Lindsey Foster is a 75 y.o. female coming in with complaint of low back pain. Patient states that she is no longer having back pain. Patient saw PT and performed cobra exercise while therapist pushed on spine and patient no longer has pain. Patient has done 6 sessions in total and has been partially discharged.   L knee pain continues. Is working with therapist for knee. Knee brace does help. Patient has not had to wear brace for past 2 weeks, use Tylenol or use mediation for pain relief for her knee. Is doing water aerobics which she believes is helping.     01/25/2021 Xray lower back IMPRESSION: No acute fracture or traumatic listhesis of the lumbar spine.   No acute traumatic abnormality of the bony pelvis or proximal femora.   Progressive degenerative changes in the  lumbar spine, most pronounced towards the lower lumbar levels.   Additional progressive mild-to-moderate degenerative changes in the SI joints, hips and symphysis pubis.   Diffuse bony demineralization.     Electronically Signed   By: Kreg Shropshire M.D.   On: 01/26/2021 02:59      Past Medical History:  Diagnosis Date   Allergic rhinitis    Anxiety    panic attacks in hot rooms   Arthritis    Blood in stool    Breast cancer (HCC) 2006   radiation - Rt   Chickenpox    CKD (chronic kidney disease)    Colon polyps    Diverticulosis    GERD (gastroesophageal reflux disease)    Heart murmur    Hyperlipidemia    Hypertension    Hypothyroidism    Multiple thyroid nodules    Osteopenia    Personal history of radiation therapy    Urinary incontinence    UTI (lower urinary tract infection)    Vitamin D deficiency disease    Past Surgical History:  Procedure Laterality Date   BLADDER SURGERY     BREAST EXCISIONAL BIOPSY Right 2006   positive   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   CESAREAN SECTION  1985   COLONOSCOPY WITH PROPOFOL N/A 09/28/2015   Procedure: COLONOSCOPY WITH PROPOFOL;  Surgeon: Christena Deem, MD;  Location: [facility] ENDOSCOPY;  Service: Endoscopy;  Laterality: N/A;   DILATION AND CURETTAGE OF UTERUS  2012   Thyroid nodule ablation  2003   Uterine polyp removal  2009    Social History   Socioeconomic History   Marital status: Widowed    Spouse name: Not on file   Number of children: Not on file   Years of education: Not on file   Highest education level: Not on file  Occupational History   Not on file  Tobacco Use   Smoking status: Former   Smokeless tobacco: Never  Vaping Use   Vaping Use: Never used  Substance and Sexual Activity   Alcohol use: No    Alcohol/week: 0.0 standard drinks   Drug use: No   Sexual activity: Not on file  Other Topics Concern   Not on file  Social History Narrative   Not on file   Social Determinants of Health    Financial Resource Strain: Low Risk    Difficulty of Paying Living Expenses: Not hard at all  Food Insecurity: No Food Insecurity   Worried About Charity fundraiser in the Last Year: Never true   [place] in the Last Year: Never true  Transportation Needs: No Transportation Needs   Lack of Transportation (Medical): No   Lack of Transportation (Non-Medical): No  Physical Activity: Insufficiently Active   Days of Exercise per Week: 2 days   Minutes of Exercise per Session: 30 min  Stress: No Stress Concern Present   Feeling of Stress : Not at all  Social Connections: Unknown   Frequency of Communication with Friends and Family: More than three times a week   Frequency of Social Gatherings with Friends and Family: Not on file   Attends Religious Services: Not on Electrical engineer or Organizations: Not on file   Attends Archivist Meetings: Not on file   Marital Status: Not on file   Allergies  Allergen Reactions   Latex Rash   Family History  Problem Relation Age of Onset   Heart disease Other        Parent   Kidney disease Other        Parent   Uterine cancer Maternal Aunt    Kidney disease Mother    Angina Father    Heart attack Father    Multiple myeloma Brother    Breast cancer Neg Hx      Current Outpatient Medications (Cardiovascular):    amLODipine (NORVASC) 2.5 MG tablet, TAKE 1 TABLET(2.5 MG) BY MOUTH DAILY  Current Outpatient Medications (Respiratory):    fluticasone (FLONASE SENSIMIST) 27.5 MCG/SPRAY nasal spray, Place 1 spray into the nose daily as needed.    loratadine (CLARITIN) 10 MG tablet, Take 1 tablet (10 mg total) by mouth daily.    Current Outpatient Medications (Other):    Calcium Citrate-Vitamin D3 1000-400 LIQD, Take 1 tablet by mouth daily. Reported on 02/04/2016   clobetasol cream (TEMOVATE) 0.05 %, Apply topically 2 (two) times daily. For 2 weeks, then just on week days as needed   COVID-19 mRNA Vac-TriS,  Pfizer, SUSP injection, USE AS DIRECTED   famotidine (PEPCID) 20 MG tablet, TAKE 1 TABLET(20 MG) BY MOUTH TWICE DAILY BEFORE A MEAL   Garlic 440 MG TABS, Take 1 tablet by mouth daily.    ketotifen (ZADITOR) 0.025 % ophthalmic solution, 1 drop 2 (two) times daily.   mometasone (ELOCON) 0.1 % lotion, Apply topically daily.   Reviewed prior external information including notes and imaging from  primary care provider As well as notes that were available from care everywhere and other healthcare systems.  Past  medical history, social, surgical and family history all reviewed in electronic medical record.  No pertanent information unless stated regarding to the chief complaint.   Review of Systems:  No headache, visual changes, nausea, vomiting, diarrhea, constipation, dizziness, abdominal pain, skin rash, fevers, chills, night sweats, weight loss, swollen lymph nodes,  joint swelling, chest pain, shortness of breath, mood changes. POSITIVE muscle aches, body aches  Objective  Blood pressure 126/82, pulse 72, height $RemoveBe'5\' 7"'rgfpaqALj$  (1.702 m), weight 186 lb (84.4 kg), SpO2 98 %.   General: No apparent distress alert and oriented x3 mood and affect normal, dressed appropriately.  HEENT: Pupils equal, extraocular movements intact  Respiratory: Patient's speak in full sentences and does not appear short of breath  Cardiovascular: No lower extremity edema, non tender, no erythema  Gait antalgic gait Severe arthritic changes of the left knee noted.  Instability with valgus and varus force.  When patient stands patient has significant varus deformity noted.  Low back exam appears to be fairly unremarkable at the moment.  Patient does have mild degenerative scoliosis.  Mild tightness with straight leg test.  Patient has some mild uncomfortable feeling when patient does not go from a seated to standing position.    Impression and Recommendations:     The above documentation has been reviewed and is accurate  and complete Lyndal Pulley, DO

## 2021-03-11 ENCOUNTER — Other Ambulatory Visit: Payer: Self-pay

## 2021-03-11 ENCOUNTER — Encounter: Payer: Self-pay | Admitting: Family Medicine

## 2021-03-11 ENCOUNTER — Ambulatory Visit (INDEPENDENT_AMBULATORY_CARE_PROVIDER_SITE_OTHER): Payer: Medicare Other | Admitting: Family Medicine

## 2021-03-11 ENCOUNTER — Ambulatory Visit: Payer: Medicare Other | Admitting: Physical Therapy

## 2021-03-11 DIAGNOSIS — M1712 Unilateral primary osteoarthritis, left knee: Secondary | ICD-10-CM | POA: Diagnosis not present

## 2021-03-11 NOTE — Patient Instructions (Signed)
Good to see you Watch the knee We have multiple tricks still Stay active Do exercises Voltaren gel over the counter See me again in 3-4 months

## 2021-03-11 NOTE — Assessment & Plan Note (Signed)
Discussed with patient again in great length.  Patient has responded fairly well to conservative therapy.  Have not done any treatment injections at this moment but do feel that it may be beneficial.  Patient wants to avoid surgical intervention but due to the severity and exam to some instability I do believe patient does need to consider this in the future.  Patient wants to continue to monitor it at the moment.  After discussing with patient as well as going over imaging of patient's back and looking at a posterior greater than 36 minutes spent today on this patient.

## 2021-03-16 ENCOUNTER — Ambulatory Visit: Payer: Medicare Other | Admitting: Physical Therapy

## 2021-03-18 ENCOUNTER — Ambulatory Visit: Payer: Medicare Other | Admitting: Physical Therapy

## 2021-03-18 DIAGNOSIS — N1831 Chronic kidney disease, stage 3a: Secondary | ICD-10-CM | POA: Diagnosis not present

## 2021-03-23 ENCOUNTER — Ambulatory Visit: Payer: Medicare Other | Admitting: Physical Therapy

## 2021-03-24 DIAGNOSIS — I10 Essential (primary) hypertension: Secondary | ICD-10-CM | POA: Diagnosis not present

## 2021-03-24 DIAGNOSIS — N2581 Secondary hyperparathyroidism of renal origin: Secondary | ICD-10-CM | POA: Diagnosis not present

## 2021-03-24 DIAGNOSIS — N1832 Chronic kidney disease, stage 3b: Secondary | ICD-10-CM | POA: Diagnosis not present

## 2021-03-25 ENCOUNTER — Encounter: Payer: Medicare Other | Admitting: Physical Therapy

## 2021-03-30 ENCOUNTER — Encounter: Payer: Medicare Other | Admitting: Physical Therapy

## 2021-04-01 ENCOUNTER — Encounter: Payer: Medicare Other | Admitting: Physical Therapy

## 2021-04-06 ENCOUNTER — Encounter: Payer: Medicare Other | Admitting: Physical Therapy

## 2021-04-08 ENCOUNTER — Encounter: Payer: Medicare Other | Admitting: Physical Therapy

## 2021-04-09 ENCOUNTER — Other Ambulatory Visit: Payer: Self-pay | Admitting: Family Medicine

## 2021-04-13 ENCOUNTER — Encounter: Payer: Medicare Other | Admitting: Physical Therapy

## 2021-04-13 DIAGNOSIS — H6123 Impacted cerumen, bilateral: Secondary | ICD-10-CM | POA: Diagnosis not present

## 2021-04-13 DIAGNOSIS — H60541 Acute eczematoid otitis externa, right ear: Secondary | ICD-10-CM | POA: Diagnosis not present

## 2021-04-13 DIAGNOSIS — H903 Sensorineural hearing loss, bilateral: Secondary | ICD-10-CM | POA: Diagnosis not present

## 2021-04-15 ENCOUNTER — Ambulatory Visit: Payer: BC Managed Care – PPO | Admitting: Physical Therapy

## 2021-04-21 DIAGNOSIS — R1031 Right lower quadrant pain: Secondary | ICD-10-CM | POA: Diagnosis not present

## 2021-04-21 DIAGNOSIS — R1032 Left lower quadrant pain: Secondary | ICD-10-CM | POA: Diagnosis not present

## 2021-04-21 DIAGNOSIS — K625 Hemorrhage of anus and rectum: Secondary | ICD-10-CM | POA: Diagnosis not present

## 2021-04-30 ENCOUNTER — Encounter: Payer: Self-pay | Admitting: *Deleted

## 2021-04-30 ENCOUNTER — Encounter
Admission: RE | Disposition: A | Discharge status: Home / Self Care | Payer: Self-pay | Source: Home / Self Care | Attending: Gastroenterology

## 2021-04-30 ENCOUNTER — Ambulatory Visit
Admission: RE | Admit: 2021-04-30 | Discharge: 2021-04-30 | Disposition: A | Discharge status: Home / Self Care | Payer: Medicare Other | Attending: Gastroenterology | Admitting: Gastroenterology

## 2021-04-30 ENCOUNTER — Ambulatory Visit: Payer: Medicare Other | Admitting: Anesthesiology

## 2021-04-30 DIAGNOSIS — Z853 Personal history of malignant neoplasm of breast: Secondary | ICD-10-CM | POA: Diagnosis not present

## 2021-04-30 DIAGNOSIS — Z91040 Latex allergy status: Secondary | ICD-10-CM | POA: Diagnosis not present

## 2021-04-30 DIAGNOSIS — R1032 Left lower quadrant pain: Secondary | ICD-10-CM | POA: Diagnosis not present

## 2021-04-30 DIAGNOSIS — K5731 Diverticulosis of large intestine without perforation or abscess with bleeding: Secondary | ICD-10-CM | POA: Insufficient documentation

## 2021-04-30 DIAGNOSIS — Z87891 Personal history of nicotine dependence: Secondary | ICD-10-CM | POA: Insufficient documentation

## 2021-04-30 DIAGNOSIS — Z79899 Other long term (current) drug therapy: Secondary | ICD-10-CM | POA: Insufficient documentation

## 2021-04-30 DIAGNOSIS — K640 First degree hemorrhoids: Secondary | ICD-10-CM | POA: Diagnosis not present

## 2021-04-30 DIAGNOSIS — K5730 Diverticulosis of large intestine without perforation or abscess without bleeding: Secondary | ICD-10-CM | POA: Diagnosis not present

## 2021-04-30 DIAGNOSIS — R1031 Right lower quadrant pain: Secondary | ICD-10-CM | POA: Diagnosis not present

## 2021-04-30 DIAGNOSIS — R1030 Lower abdominal pain, unspecified: Secondary | ICD-10-CM | POA: Diagnosis not present

## 2021-04-30 DIAGNOSIS — I10 Essential (primary) hypertension: Secondary | ICD-10-CM | POA: Diagnosis not present

## 2021-04-30 DIAGNOSIS — Z923 Personal history of irradiation: Secondary | ICD-10-CM | POA: Insufficient documentation

## 2021-04-30 DIAGNOSIS — K625 Hemorrhage of anus and rectum: Secondary | ICD-10-CM | POA: Diagnosis not present

## 2021-04-30 HISTORY — PX: COLONOSCOPY: SHX5424

## 2021-04-30 LAB — HM COLONOSCOPY

## 2021-04-30 SURGERY — COLONOSCOPY
Anesthesia: General

## 2021-04-30 MED ORDER — PROPOFOL 500 MG/50ML IV EMUL
INTRAVENOUS | Status: DC | PRN
  Administered 2021-04-30: 140 ug/kg/min via INTRAVENOUS

## 2021-04-30 MED ORDER — PROPOFOL 500 MG/50ML IV EMUL
INTRAVENOUS | Status: AC
  Filled 2021-04-30: qty 50

## 2021-04-30 MED ORDER — PROPOFOL 10 MG/ML IV BOLUS
INTRAVENOUS | Status: DC | PRN
  Administered 2021-04-30: 60 mg via INTRAVENOUS
  Administered 2021-04-30: 20 mg via INTRAVENOUS

## 2021-04-30 MED ORDER — SODIUM CHLORIDE 0.9 % IV SOLN: INTRAVENOUS | Status: DC

## 2021-04-30 MED ORDER — LIDOCAINE HCL (CARDIAC) PF 100 MG/5ML IV SOSY
PREFILLED_SYRINGE | INTRAVENOUS | Status: DC | PRN
  Administered 2021-04-30: 100 mg via INTRAVENOUS

## 2021-04-30 NOTE — Transfer of Care (Signed)
Immediate Anesthesia Transfer of Care Note  Patient: Lindsey Foster  Procedure(s) Performed: COLONOSCOPY  Patient Location: Endoscopy Unit  Anesthesia Type:General  Level of Consciousness: drowsy  Airway & Oxygen Therapy: Patient Spontanous Breathing  Post-op Assessment: Report given to RN and Post -op Vital signs reviewed and stable  Post vital signs: Reviewed and stable  Last Vitals:  Vitals Value Taken Time  BP 136/67   Temp    Pulse 67   Resp 10   SpO2 96     Last Pain:  Vitals:   04/30/21 0832  TempSrc: Temporal         Complications: No notable events documented.

## 2021-04-30 NOTE — Interval H&P Note (Signed)
History and Physical Interval Note:  04/30/2021 9:43 AM  Lindsey Foster  has presented today for surgery, with the diagnosis of (R10.31, R10.32) BILATERAL LOWER ABDOMINAL PAIN (K62.5) RECTAL BLEEDING.  The various methods of treatment have been discussed with the patient and family. After consideration of risks, benefits and other options for treatment, the patient has consented to  Procedure(s): COLONOSCOPY (N/A) as a surgical intervention.  The patient's history has been reviewed, patient examined, no change in status, stable for surgery.  I have reviewed the patient's chart and labs.  Questions were answered to the patient's satisfaction.     [name]  Ok to proceed with colonoscopy

## 2021-04-30 NOTE — Anesthesia Preprocedure Evaluation (Signed)
Anesthesia Evaluation  Patient identified by MRN, date of birth, ID band Patient awake    Reviewed: Allergy & Precautions, NPO status , Patient's Chart, lab work & pertinent test results  History of Anesthesia Complications Negative for: history of anesthetic complications  Airway Mallampati: II  TM Distance: >3 FB Neck ROM: Full    Dental no notable dental hx.    Pulmonary neg sleep apnea, neg COPD, former smoker,    breath sounds clear to auscultation- rhonchi (-) wheezing      Cardiovascular hypertension, Pt. on medications (-) CAD, (-) Past MI, (-) Cardiac Stents and (-) CABG  Rhythm:Regular Rate:Normal - Systolic murmurs and - Diastolic murmurs    Neuro/Psych neg Seizures Anxiety negative neurological ROS     GI/Hepatic Neg liver ROS, GERD  ,  Endo/Other  neg diabetesHypothyroidism   Renal/GU Renal InsufficiencyRenal disease     Musculoskeletal  (+) Arthritis ,   Abdominal (+) - obese,   Peds  Hematology negative hematology ROS (+)   Anesthesia Other Findings Past Medical History: No date: Allergic rhinitis No date: Anxiety     Comment:  panic attacks in hot rooms No date: Arthritis No date: Blood in stool 2006: Breast cancer ([place])     Comment:  radiation - Rt No date: Chickenpox No date: CKD (chronic kidney disease) No date: Colon polyps No date: Diverticulosis No date: GERD (gastroesophageal reflux disease) No date: Heart murmur No date: Hyperlipidemia No date: Hypertension No date: Hypothyroidism No date: Multiple thyroid nodules No date: Osteopenia No date: Personal history of radiation therapy No date: Urinary incontinence No date: UTI (lower urinary tract infection) No date: Vitamin D deficiency disease   Reproductive/Obstetrics                             Anesthesia Physical Anesthesia Plan  ASA: 2  Anesthesia Plan: General   Post-op Pain Management:     Induction: Intravenous  PONV Risk Score and Plan: 2 and Propofol infusion  Airway Management Planned: Natural Airway  Additional Equipment:   Intra-op Plan:   Post-operative Plan:   Informed Consent: I have reviewed the patients History and Physical, chart, labs and discussed the procedure including the risks, benefits and alternatives for the proposed anesthesia with the patient or authorized representative who has indicated his/her understanding and acceptance.     Dental advisory given  Plan Discussed with: CRNA and Anesthesiologist  Anesthesia Plan Comments:         Anesthesia Quick Evaluation

## 2021-04-30 NOTE — Op Note (Signed)
[facility] Gastroenterology Patient Name: Lindsey Foster Procedure Date: 04/30/2021 9:45 AM MRN: [id number] Account #: [id number] Date of Birth: [birth year] Admit Type: Outpatient Age: 75 Room: [facility] ENDO ROOM 3 Gender: Female Note Status: Finalized Instrument Name: Jasper Riling [id number] Procedure:             Colonoscopy Indications:           Lower abdominal pain, Rectal bleeding Providers:             Andrey Farmer MD, MD Referring MD:          Angela Adam. Caryl Bis (Referring MD) Medicines:             Monitored Anesthesia Care Complications:         No immediate complications. Procedure:             Pre-Anesthesia Assessment:                        - Prior to the procedure, a History and Physical was                         performed, and patient medications and allergies were                         reviewed. The patient is competent. The risks and                         benefits of the procedure and the sedation options and                         risks were discussed with the patient. All questions                         were answered and informed consent was obtained.                         Patient identification and proposed procedure were                         verified by the physician, the nurse, the anesthetist                         and the technician in the endoscopy suite. Mental                         Status Examination: alert and oriented. Airway                         Examination: normal oropharyngeal airway and neck                         mobility. Respiratory Examination: clear to                         auscultation. CV Examination: normal. Prophylactic                         Antibiotics: The patient does not require prophylactic  antibiotics. Prior Anticoagulants: The patient has                         taken no previous anticoagulant or antiplatelet                         agents. ASA Grade Assessment: II - A  patient with mild                         systemic disease. After reviewing the risks and                         benefits, the patient was deemed in satisfactory                         condition to undergo the procedure. The anesthesia                         plan was to use monitored anesthesia care (MAC).                         Immediately prior to administration of medications,                         the patient was re-assessed for adequacy to receive                         sedatives. The heart rate, respiratory rate, oxygen                         saturations, blood pressure, adequacy of pulmonary                         ventilation, and response to care were monitored                         throughout the procedure. The physical status of the                         patient was re-assessed after the procedure.                        After obtaining informed consent, the colonoscope was                         passed under direct vision. Throughout the procedure,                         the patient's blood pressure, pulse, and oxygen                         saturations were monitored continuously. The                         Colonoscope was introduced through the anus and                         advanced to the the terminal ileum. The colonoscopy  was performed without difficulty. The patient                         tolerated the procedure well. The quality of the bowel                         preparation was excellent. Findings:      The perianal and digital rectal examinations were normal.      The terminal ileum appeared normal.      A few small-mouthed diverticula were found in the hepatic flexure and       ascending colon.      Many small-mouthed diverticula were found in the sigmoid colon and       descending colon.      Internal hemorrhoids were found during retroflexion. The hemorrhoids       were Grade I (internal hemorrhoids that do not  prolapse).      The exam was otherwise without abnormality on direct and retroflexion       views. Impression:            - The examined portion of the ileum was normal.                        - Diverticulosis at the hepatic flexure and in the                         ascending colon.                        - Diverticulosis in the sigmoid colon and in the                         descending colon.                        - Internal hemorrhoids.                        - The examination was otherwise normal on direct and                         retroflexion views.                        - No specimens collected. Recommendation:        - Discharge patient to home.                        - Resume previous diet.                        - Continue present medications.                        - Repeat colonoscopy is not recommended due to current                         age (51 years or older) for screening purposes.                        - Return to referring physician as previously  scheduled. Procedure Code(s):     --- Professional ---                        [phone], Colonoscopy, flexible; diagnostic, including                         collection of specimen(s) by brushing or washing, when                         performed (separate procedure) Diagnosis Code(s):     --- Professional ---                        K64.0, First degree hemorrhoids                        R10.30, Lower abdominal pain, unspecified                        K62.5, Hemorrhage of anus and rectum                        K57.30, Diverticulosis of large intestine without                         perforation or abscess without bleeding CPT copyright 2019 American Medical Association. All rights reserved. The codes documented in this report are preliminary and upon coder review may  be revised to meet current compliance requirements. Andrey Farmer MD, MD 04/30/2021 10:09:18 AM Number of Addenda: 0 Note  Initiated On: 04/30/2021 9:45 AM Scope Withdrawal Time: 0 hours 6 minutes 48 seconds  Total Procedure Duration: 0 hours 13 minutes 39 seconds  Estimated Blood Loss:  Estimated blood loss: none.      [facility]

## 2021-04-30 NOTE — H&P (Signed)
Outpatient short stay form Pre-procedure 04/30/2021  [name], MD  Primary Physician: [name], MD  Reason for visit:  Abdominal pain/rectal bleeding  History of present illness:   75 y/o lady with history of hypertension here for colonoscopy due to abdominal pain and rectal bleeding. No family history of GI malignancies. No blood thinners. History of c-section.     Current Facility-Administered Medications:    0.9 %  sodium chloride infusion, , Intravenous, Continuous, Elad Macphail, Hilton Cork, MD, Last Rate: 20 mL/hr at 04/30/21 0849, Continued from Pre-op at 04/30/21 0849  Medications Prior to Admission  Medication Sig Dispense Refill Last Dose   amLODipine (NORVASC) 2.5 MG tablet TAKE 1 TABLET(2.5 MG) BY MOUTH DAILY 90 tablet 1 04/29/2021   Calcium Citrate-Vitamin D3 1000-400 LIQD Take 1 tablet by mouth daily. Reported on 02/04/2016      clobetasol cream (TEMOVATE) 0.05 % Apply topically 2 (two) times daily. For 2 weeks, then just on week days as needed 60 g 1    COVID-19 mRNA Vac-TriS, Pfizer, SUSP injection USE AS DIRECTED .3 mL 0    famotidine (PEPCID) 20 MG tablet TAKE 1 TABLET(20 MG) BY MOUTH TWICE DAILY BEFORE A MEAL 180 tablet 1    fluticasone (FLONASE SENSIMIST) 27.5 MCG/SPRAY nasal spray Place 1 spray into the nose daily as needed.       Garlic 123XX123 MG TABS Take 1 tablet by mouth daily.       ketotifen (ZADITOR) 0.025 % ophthalmic solution 1 drop 2 (two) times daily.      loratadine (CLARITIN) 10 MG tablet Take 1 tablet (10 mg total) by mouth daily. 30 tablet 11    mometasone (ELOCON) 0.1 % lotion Apply topically daily.        Allergies  Allergen Reactions   Latex Rash     Past Medical History:  Diagnosis Date   Allergic rhinitis    Anxiety    panic attacks in hot rooms   Arthritis    Blood in stool    Breast cancer ([place]) 2006   radiation - Rt   Chickenpox    CKD (chronic kidney disease)    Colon polyps    Diverticulosis    GERD  (gastroesophageal reflux disease)    Heart murmur    Hyperlipidemia    Hypertension    Hypothyroidism    Multiple thyroid nodules    Osteopenia    Personal history of radiation therapy    Urinary incontinence    UTI (lower urinary tract infection)    Vitamin D deficiency disease     Review of systems:  Otherwise negative.    Physical Exam  Gen: Alert, oriented. Appears stated age.  HEENT: PERRLA. Lungs: No respiratory distress CV: RRR Abd: soft, benign, no masses Ext: No edema    Planned procedures: Proceed with colonoscopy. The patient understands the nature of the planned procedure, indications, risks, alternatives and potential complications including but not limited to bleeding, infection, perforation, damage to internal organs and possible oversedation/side effects from anesthesia. The patient agrees and gives consent to proceed.  Please refer to procedure notes for findings, recommendations and patient disposition/instructions.     [name], MD [facility] Gastroenterology

## 2021-04-30 NOTE — Anesthesia Postprocedure Evaluation (Signed)
Anesthesia Post Note  Patient: Lindsey Foster  Procedure(s) Performed: COLONOSCOPY  Patient location during evaluation: Endoscopy Anesthesia Type: General Level of consciousness: awake and alert and oriented Pain management: pain level controlled Vital Signs Assessment: post-procedure vital signs reviewed and stable Respiratory status: spontaneous breathing, nonlabored ventilation and respiratory function stable Cardiovascular status: blood pressure returned to baseline and stable Postop Assessment: no signs of nausea or vomiting Anesthetic complications: no   No notable events documented.   Last Vitals:  Vitals:   04/30/21 1018 04/30/21 1028  BP: 139/68 (!) 178/90  Pulse: 66 (!) 59  Resp: 15 (!) 22  Temp:    SpO2: 98% 100%    Last Pain:  Vitals:   04/30/21 1028  TempSrc:   PainSc: 0-No pain                 Bryleigh Ottaway

## 2021-04-30 NOTE — Anesthesia Procedure Notes (Signed)
Date/Time: 04/30/2021 9:47 AM Performed by: Lily Peer, Joscelin Fray, CRNA Pre-anesthesia Checklist: Patient identified, Emergency Drugs available, Suction available, Patient being monitored and Timeout performed Patient Re-evaluated:Patient Re-evaluated prior to induction Oxygen Delivery Method: Nasal cannula Induction Type: IV induction

## 2021-05-10 ENCOUNTER — Ambulatory Visit (INDEPENDENT_AMBULATORY_CARE_PROVIDER_SITE_OTHER): Payer: Medicare Other | Admitting: Family Medicine

## 2021-05-10 ENCOUNTER — Other Ambulatory Visit: Payer: Self-pay

## 2021-05-10 VITALS — BP 140/80 | HR 71 | Temp 98.6°F | Ht 67.0 in | Wt 184.6 lb

## 2021-05-10 DIAGNOSIS — J309 Allergic rhinitis, unspecified: Secondary | ICD-10-CM | POA: Diagnosis not present

## 2021-05-10 DIAGNOSIS — E559 Vitamin D deficiency, unspecified: Secondary | ICD-10-CM

## 2021-05-10 DIAGNOSIS — I10 Essential (primary) hypertension: Secondary | ICD-10-CM

## 2021-05-10 DIAGNOSIS — E785 Hyperlipidemia, unspecified: Secondary | ICD-10-CM

## 2021-05-10 DIAGNOSIS — K219 Gastro-esophageal reflux disease without esophagitis: Secondary | ICD-10-CM | POA: Diagnosis not present

## 2021-05-10 DIAGNOSIS — R252 Cramp and spasm: Secondary | ICD-10-CM

## 2021-05-10 DIAGNOSIS — R519 Headache, unspecified: Secondary | ICD-10-CM | POA: Diagnosis not present

## 2021-05-10 LAB — BASIC METABOLIC PANEL
BUN: 16 mg/dL (ref 6–23)
CO2: 31 mEq/L (ref 19–32)
Calcium: 9.8 mg/dL (ref 8.4–10.5)
Chloride: 103 mEq/L (ref 96–112)
Creatinine, Ser: 1.32 mg/dL — ABNORMAL HIGH (ref 0.40–1.20)
GFR: 38.95 mL/min — ABNORMAL LOW (ref >60.00)
Glucose, Bld: 100 mg/dL — ABNORMAL HIGH (ref 70–99)
Potassium: 4.1 mEq/L (ref 3.5–5.1)
Sodium: 140 mEq/L (ref 135–145)

## 2021-05-10 LAB — MAGNESIUM: Magnesium: 1.8 mg/dL (ref 1.5–2.5)

## 2021-05-10 LAB — LIPID PANEL
Cholesterol: 218 mg/dL — ABNORMAL HIGH (ref 0–200)
HDL: 73.6 mg/dL (ref >39.00)
LDL Cholesterol: 132 mg/dL — ABNORMAL HIGH (ref 0–99)
NonHDL: 144.75
Total CHOL/HDL Ratio: 3
Triglycerides: 66 mg/dL (ref 0.0–149.0)
VLDL: 13.2 mg/dL (ref 0.0–40.0)

## 2021-05-10 MED ORDER — AMLODIPINE BESYLATE 5 MG PO TABS
5.0000 mg | ORAL_TABLET | Freq: Every day | ORAL | 3 refills | Status: DC
Start: 2021-05-10 — End: 2022-02-08

## 2021-05-10 MED ORDER — FAMOTIDINE 20 MG PO TABS: ORAL_TABLET | ORAL | 1 refills | Status: DC

## 2021-05-10 NOTE — Assessment & Plan Note (Signed)
Patient with new onset headaches.  These certainly could be related to allergies or sinus irritation though given her age the concern would be for an underlying intracranial process.  Discussed this with the patient.  We will obtain an MRI.  We will trial Astelin to see if that will help with an allergy component.  Her blood pressure has been slightly elevated and we will see if an increased dose of her amlodipine may help with the headaches if it reduces her blood pressure.  Discussed that she could take Tylenol over-the-counter if needed.

## 2021-05-10 NOTE — Assessment & Plan Note (Signed)
Check labs to evaluate further.

## 2021-05-10 NOTE — Assessment & Plan Note (Signed)
Check lipid panel  

## 2021-05-10 NOTE — Progress Notes (Signed)
[name], MD Phone: [phone]  Lindsey Foster is a 75 y.o. female who presents today for f/u.  HYPERTENSION Disease Monitoring Home BP Monitoring 126-152/67-80 Chest pain- no    Dyspnea- no Medications Compliance-  taking amlodipine.  Edema- no BMET    Component Value Date/Time   NA 141 11/06/2020 1120   NA 142 07/04/2014 1041   K 4.4 11/06/2020 1120   K 3.9 07/04/2014 1041   CL 103 11/06/2020 1120   CL 108 (H) 07/04/2014 1041   CO2 30 11/06/2020 1120   CO2 30 07/04/2014 1041   GLUCOSE 97 11/06/2020 1120   GLUCOSE 86 07/04/2014 1041   BUN 15 11/06/2020 1120   BUN 12 07/04/2014 1041   CREATININE 1.23 (H) 11/06/2020 1120   CREATININE 1.18 07/04/2014 1041   CALCIUM 9.7 11/06/2020 1120   CALCIUM 8.8 07/04/2014 1041   GFRNONAA 47 (L) 08/13/2020 1015   GFRNONAA 48 (L) 07/04/2014 1041   GFRNONAA 42 (L) 07/04/2013 1048   GFRAA 50 (L) 04/13/2020 1243   GFRAA 58 (L) 07/04/2014 1041   GFRAA 49 (L) 07/04/2013 1048   Leg cramps: These have been going on for a while now.  In the past she was advised her low vitamin D could be contributing.  Headaches: These have been going on for about 2 months.  They occur across her forehead.  It is not an aching sensation.  She notes it is 7/10.  Lasts for about 30 minutes.  They do not occur daily.  No numbness or weakness.  She does note she had slight vision change on her eye check back in May.  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    Dizziness: This has been going on for some time now.  Does not feel like she is going to fall though if she turns her head fast she feels off balance.  No spinning.  No ear fullness.  She did see ENT about a month ago and mentioned this to them though no intervention was undertaken.  She reports she had a tooth pulled about a month ago and does note the dizziness has lessened somewhat since then.  She does have a history of allergies.  Social  History   Tobacco Use  Smoking Status Former  Smokeless Tobacco Never    Current Outpatient Medications on File Prior to Visit  Medication Sig Dispense Refill   Calcium Citrate-Vitamin D3 1000-400 LIQD Take 1 tablet by mouth daily. Reported on 02/04/2016     clobetasol cream (TEMOVATE) 0.05 % Apply topically 2 (two) times daily. For 2 weeks, then just on week days as needed 60 g 1   COVID-19 mRNA Vac-TriS, Pfizer, SUSP injection USE AS DIRECTED .3 mL 0   famotidine (PEPCID) 20 MG tablet TAKE 1 TABLET(20 MG) BY MOUTH TWICE DAILY BEFORE A MEAL 180 tablet 1   fluticasone (FLONASE SENSIMIST) 27.5 MCG/SPRAY nasal spray Place 1 spray into the nose daily as needed.      Garlic 659 MG TABS Take 1 tablet by mouth daily.      ketotifen (ZADITOR) 0.025 % ophthalmic solution 1 drop 2 (two) times daily.     loratadine (CLARITIN) 10 MG tablet Take 1 tablet (10 mg total) by mouth daily. 30 tablet 11   mometasone (ELOCON) 0.1 % lotion Apply topically daily.     No current facility-administered medications on file prior to visit.     ROS see history of  present illness  Objective  Physical Exam Vitals:   05/10/21 1049  BP: 140/80  Pulse: 71  Temp: 98.6 F (37 C)  SpO2: 98%    BP Readings from Last 3 Encounters:  05/10/21 140/80  04/30/21 (!) 178/90  03/11/21 126/82   Wt Readings from Last 3 Encounters:  05/10/21 184 lb 9.6 oz (83.7 kg)  04/30/21 182 lb (82.6 kg)  03/11/21 186 lb (84.4 kg)    Physical Exam Constitutional:      General: She is not in acute distress.    Appearance: She is not diaphoretic.  HENT:     Right Ear: Tympanic membrane normal.     Left Ear: Tympanic membrane normal.     Ears:     Comments: She had some vertiginous symptoms on Dix-Hallpike testing on the left though no nystagmus, negative Dix-Hallpike on the early Cardiovascular:     Rate and Rhythm: Normal rate and regular rhythm.     Heart sounds: Normal heart sounds.  Pulmonary:     Effort: Pulmonary  effort is normal.     Breath sounds: Normal breath sounds.  Skin:    General: Skin is warm and dry.  Neurological:     Mental Status: She is alert.     Comments: EOMI, PERRL, opens and closes eyes adequately, V1 through V3 intact light touch sensation bilaterally, hearing intact to finger rub bilaterally, shoulder shrug intact, 5/5 strength in bilateral biceps, triceps, grip, quads, hamstrings, plantar and dorsiflexion, sensation to light touch intact in bilateral UE and LE, normal gait     Assessment/Plan: Please see individual problem list.  Problem List Items Addressed This Visit     Allergic rhinitis    This could certainly be contributing to her headaches.  She thinks she may have some Astelin at home.  She will check.  If she does not have this she will contact me and I will send that in for her to try.      Essential hypertension - Primary    This has been slightly elevated at home.  She will increase her amlodipine to 5 mg once daily.  Recheck in 3 months.      Relevant Medications   amLODipine (NORVASC) 5 MG tablet   Hyperlipidemia    Check lipid panel.      Relevant Medications   amLODipine (NORVASC) 5 MG tablet   Other Relevant Orders   Lipid panel   Leg cramps    Check labs to evaluate further.      Relevant Orders   Basic Metabolic Panel (BMET)   Magnesium   New onset of headaches after age 61    Patient with new onset headaches.  These certainly could be related to allergies or sinus irritation though given her age the concern would be for an underlying intracranial process.  Discussed this with the patient.  We will obtain an MRI.  We will trial Astelin to see if that will help with an allergy component.  Her blood pressure has been slightly elevated and we will see if an increased dose of her amlodipine may help with the headaches if it reduces her blood pressure.  Discussed that she could take Tylenol over-the-counter if needed.      Relevant Medications    amLODipine (NORVASC) 5 MG tablet   Other Relevant Orders   MR Brain Wo Contrast   Vitamin D deficiency   Relevant Orders   Vitamin D (25 hydroxy)    Return in about  3 months (around 08/09/2021) for Hypertension.  This visit occurred during the SARS-CoV-2 public health emergency.  Safety protocols were in place, including screening questions prior to the visit, additional usage of staff PPE, and extensive cleaning of exam room while observing appropriate contact time as indicated for disinfecting solutions.    [name], MD [place]

## 2021-05-10 NOTE — Patient Instructions (Signed)
Nice to see you. Somebody will call you to schedule your MRI. We will get lab work today. Please check the nose rehab at home and if it is Astelin you can start using this.  If it is not Astelin please let me know and I can send this to your pharmacy. We will increase your amlodipine to 5 mg once daily.

## 2021-05-10 NOTE — Assessment & Plan Note (Signed)
This could certainly be contributing to her headaches.  She thinks she may have some Astelin at home.  She will check.  If she does not have this she will contact me and I will send that in for her to try.

## 2021-05-10 NOTE — Assessment & Plan Note (Signed)
This has been slightly elevated at home.  She will increase her amlodipine to 5 mg once daily.  Recheck in 3 months.

## 2021-05-10 NOTE — Addendum Note (Signed)
Addended by: Fulton Mole D on: 05/10/2021 04:40 PM   Modules accepted: Orders

## 2021-05-11 LAB — VITAMIN D 25 HYDROXY (VIT D DEFICIENCY, FRACTURES): VITD: 38.04 ng/mL (ref 30.00–100.00)

## 2021-05-14 ENCOUNTER — Telehealth: Payer: Self-pay | Admitting: Family Medicine

## 2021-05-14 NOTE — Telephone Encounter (Signed)
Patient calling back in and is needing her scheduled closed MRI rescheduled to the open MRI as she is claustrophobic.   Please advise

## 2021-05-20 ENCOUNTER — Ambulatory Visit: Payer: Medicare Other

## 2021-05-20 NOTE — Progress Notes (Signed)
Error. ng 

## 2021-05-31 ENCOUNTER — Telehealth: Payer: Self-pay | Admitting: Family Medicine

## 2021-05-31 NOTE — Telephone Encounter (Signed)
Patient called in stating that [facility] is requesting a prior auth for her MRI that she is having tomorrow morning in order for her supplemental insurance to cover it for her.Please help the patient with her MRI.

## 2021-05-31 NOTE — Telephone Encounter (Signed)
Patient called back and cancel request. It has been fixed.

## 2021-06-04 ENCOUNTER — Telehealth: Payer: Self-pay | Admitting: Family Medicine

## 2021-06-04 NOTE — Telephone Encounter (Signed)
I got a fax stating that the patient was unable to complete her MRI.  I do think this is important.  Can you find out exactly why she could not proceed with it so I can figure out what we need to do to get it completed?  Thanks.

## 2021-06-07 NOTE — Telephone Encounter (Signed)
Noted. Would she be willing to do this in an open MRI or with some anti-anxiety medications?

## 2021-06-07 NOTE — Telephone Encounter (Signed)
LVM for patient to call back and ask for Torri Michalski.  Zyen Triggs,cma  

## 2021-06-07 NOTE — Telephone Encounter (Signed)
I called and spoke with the patient and she stated this was an open MRI, but she stated any time I Lay on my back, even at home I feel as if I am choking, its hard to breathe and she has panic attacks because of it,  Stated she would like medication and she asked if she could be put to sleep?  She stated the staff doing the MRI stated a CT scan is quicker. whereas this procedure would be 30 minute s.  Shest       ated she was embarrased but she is willing to do something because she wants the MRI.  Romie Tay,cma

## 2021-06-07 NOTE — Telephone Encounter (Signed)
Claustrophobia,patient does not like closed in areas.   Lindsey Foster,cma

## 2021-06-11 NOTE — Telephone Encounter (Signed)
Noted. The best option would be to try an anti-anxiety medication first. This would be taken 30-60 minutes prior to the MRI and she would have to have someone drive her to and from the MRI appointment. If she is ok with this then we can work on getting her rescheduled and send in the antianxiety medication.

## 2021-06-11 NOTE — Telephone Encounter (Signed)
I called and spoke with the patient and informed he of the medication and she is okay with the medication and the reschedule of the MRI.  Lindsey Foster,cma

## 2021-06-15 ENCOUNTER — Other Ambulatory Visit: Payer: Self-pay

## 2021-06-15 ENCOUNTER — Ambulatory Visit: Payer: Medicare Other | Attending: Internal Medicine

## 2021-06-15 DIAGNOSIS — Z23 Encounter for immunization: Secondary | ICD-10-CM

## 2021-06-15 MED ORDER — PFIZER COVID-19 VAC BIVALENT 30 MCG/0.3ML IM SUSP
INTRAMUSCULAR | 0 refills | Status: DC
  Filled 2021-06-15: qty 0.3, 1d supply, fill #0

## 2021-06-15 NOTE — Progress Notes (Signed)
   Covid-19 Vaccination Clinic  Name:  Lindsey Foster    MRN: [id number] DOB: 28-Jan-1946  06/15/2021  Ms. Lindsey Foster was observed post Covid-19 immunization for 15 minutes without incident. She was provided with Vaccine Information Sheet and instruction to access the V-Safe system.   Ms. Lindsey Foster was instructed to call 911 with any severe reactions post vaccine: Difficulty breathing  Swelling of face and throat  A fast heartbeat  A bad rash all over body  Dizziness and weakness   Immunizations Administered     Name Date Dose VIS Date Route   Pfizer Covid-19 Vaccine Bivalent Booster 06/15/2021 10:04 AM 0.3 mL 04/14/2021 Intramuscular   Manufacturer: [place]   Lot: [place]: [id number]      Lu Duffel, PharmD, MBA Clinical Acute Care Pharmacist

## 2021-06-18 ENCOUNTER — Telehealth: Payer: Self-pay

## 2021-06-18 DIAGNOSIS — F40240 Claustrophobia: Secondary | ICD-10-CM

## 2021-06-18 MED ORDER — ALPRAZOLAM 0.5 MG PO TABS: 0.5000 mg | ORAL_TABLET | Freq: Once | ORAL | 0 refills | Status: AC

## 2021-06-18 NOTE — Telephone Encounter (Signed)
FYI

## 2021-06-18 NOTE — Telephone Encounter (Signed)
I called the patient and inforemd her that the medication was sent to her pharmacy.  Edith Lord,cma

## 2021-06-18 NOTE — Telephone Encounter (Signed)
Lindsey Foster  Call pt I sent in Xanax 0.5 mg for her to take 30 minutes prior to MRI.  I do not see MRI is scheduled for today  [name], can you look into this?  I looked up patient on [place] Controlled Substances Reporting System PMP AWARE and saw no activity that raised concern of inappropriate use.

## 2021-06-20 NOTE — Telephone Encounter (Signed)
Noted.  I apologize for not getting the medication sent in previously.  She needs to schedule the MRI.  She can take the 1 tablet of Xanax that was sent in by [facility] 30 to 60 minutes prior to her MRI.  She will have to have somebody drive her to the MRI and home from the MRI.

## 2021-06-20 NOTE — Telephone Encounter (Signed)
See other phone message  

## 2021-06-21 NOTE — Telephone Encounter (Signed)
I called the patient on Friday and informed her that the medication was sent and I explained that someone would need to take her to the MRI and drive her back.  She understood.  Briel Gallicchio,cma

## 2021-06-23 NOTE — Progress Notes (Signed)
Lindsey Foster [address] [place] Phone: [phone] Subjective:   Lindsey Foster, am serving as a scribe for Dr. Hulan Saas. This visit occurred during the SARS-CoV-2 public health emergency.  Safety protocols were in place, including screening questions prior to the visit, additional usage of staff PPE, and extensive cleaning of exam room while observing appropriate contact time as indicated for disinfecting solutions.   I'm seeing this patient by the request  of:  [name], MD  CC: Left knee and left ankle pain follow-up  [facility]:[id number]  03/11/2021 Discussed with patient again in great length.  Patient has responded fairly well to conservative therapy.  Have not done any treatment injections at this moment but do feel that it may be beneficial.  Patient wants to avoid surgical intervention but due to the severity and exam to some instability I do believe patient does need to consider this in the future.  Patient wants to continue to monitor it at the moment.  After discussing with patient as well as going over imaging of patient's back and looking at a posterior greater than 36 minutes spent today on this patient.  Update 06/24/2021 Lindsey Foster is a 75 y.o. female coming in with complaint of L knee pain. Patient states pain hasn't gotten better. Knee feels unstable. Left ankle is also hurting today. Not really painful, but feels swollen and weak. This feeling has happened before and usually goes away in a few days. Has gotten better since it began on 2 days ago.      Past Medical History:  Diagnosis Date   Allergic rhinitis    Anxiety    panic attacks in hot rooms   Arthritis    Blood in stool    Breast cancer ([place]) 2006   radiation - Rt   Chickenpox    CKD (chronic kidney disease)    Colon polyps    Diverticulosis    GERD (gastroesophageal reflux disease)    Heart murmur    Hyperlipidemia    Hypertension     Hypothyroidism    Multiple thyroid nodules    Osteopenia    Personal history of radiation therapy    Urinary incontinence    UTI (lower urinary tract infection)    Vitamin D deficiency disease    Past Surgical History:  Procedure Laterality Date   BLADDER SURGERY     BREAST EXCISIONAL BIOPSY Right 2006   positive   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   CESAREAN SECTION  1985   COLONOSCOPY N/A 04/30/2021   Procedure: COLONOSCOPY;  Surgeon: [name], MD;  Location: [facility] ENDOSCOPY;  Service: Endoscopy;  Laterality: N/A;   COLONOSCOPY WITH PROPOFOL N/A 09/28/2015   Procedure: COLONOSCOPY WITH PROPOFOL;  Surgeon: Lollie Sails, MD;  Location: [facility] ENDOSCOPY;  Service: Endoscopy;  Laterality: N/A;   DILATION AND CURETTAGE OF UTERUS  2012   Thyroid nodule ablation  2003   Uterine polyp removal  2009    Social History   Socioeconomic History   Marital status: Widowed    Spouse name: Not on file   Number of children: Not on file   Years of education: Not on file   Highest education level: Not on file  Occupational History   Not on file  Tobacco Use   Smoking status: Former   Smokeless tobacco: Never  Vaping Use   Vaping Use: Never used  Substance and Sexual Activity  Alcohol use: No    Alcohol/week: 0.0 standard drinks   Drug use: No   Sexual activity: Not on file  Other Topics Concern   Not on file  Social History Narrative   Not on file   Social Determinants of Health   Financial Resource Strain: Low Risk    Difficulty of Paying Living Expenses: Not hard at all  Food Insecurity: No Food Insecurity   Worried About Charity fundraiser in the Last Year: Never true   [place] in the Last Year: Never true  Transportation Needs: No Transportation Needs   Lack of Transportation (Medical): No   Lack of Transportation (Non-Medical): No  Physical Activity: Insufficiently Active   Days of Exercise per Week: 2 days   Minutes of Exercise per Session: 30 min   Stress: No Stress Concern Present   Feeling of Stress : Not at all  Social Connections: Unknown   Frequency of Communication with Friends and Family: More than three times a week   Frequency of Social Gatherings with Friends and Family: Not on file   Attends Religious Services: Not on Electrical engineer or Organizations: Not on file   Attends Archivist Meetings: Not on file   Marital Status: Not on file   Allergies  Allergen Reactions   Latex Rash   Family History  Problem Relation Age of Onset   Heart disease Other        Parent   Kidney disease Other        Parent   Uterine cancer Maternal Aunt    Kidney disease Mother    Angina Father    Heart attack Father    Multiple myeloma Brother    Breast cancer Neg Hx      Current Outpatient Medications (Cardiovascular):    amLODipine (NORVASC) 5 MG tablet, Take 1 tablet (5 mg total) by mouth daily.  Current Outpatient Medications (Respiratory):    fluticasone (FLONASE SENSIMIST) 27.5 MCG/SPRAY nasal spray, Place 1 spray into the nose daily as needed.    loratadine (CLARITIN) 10 MG tablet, Take 1 tablet (10 mg total) by mouth daily.    Current Outpatient Medications (Other):    Calcium Citrate-Vitamin D3 1000-400 LIQD, Take 1 tablet by mouth daily. Reported on 02/04/2016   clobetasol cream (TEMOVATE) 0.05 %, Apply topically 2 (two) times daily. For 2 weeks, then just on week days as needed   COVID-19 mRNA bivalent vaccine, Pfizer, (PFIZER COVID-19 VAC BIVALENT) injection, Inject into the muscle.   COVID-19 mRNA Vac-TriS, Pfizer, SUSP injection, USE AS DIRECTED   famotidine (PEPCID) 20 MG tablet, TAKE 1 TABLET(20 MG) BY MOUTH TWICE DAILY BEFORE A MEAL   Garlic 676 MG TABS, Take 1 tablet by mouth daily.    ketotifen (ZADITOR) 0.025 % ophthalmic solution, 1 drop 2 (two) times daily.   mometasone (ELOCON) 0.1 % lotion, Apply topically daily.   Reviewed prior external information including notes and imaging  from  primary care provider As well as notes that were available from care everywhere and other healthcare systems.  Past medical history, social, surgical and family history all reviewed in electronic medical record.  No pertanent information unless stated regarding to the chief complaint.   Review of Systems:  No headache, visual changes, nausea, vomiting, diarrhea, constipation, dizziness, abdominal pain, skin rash, fevers, chills, night sweats, weight loss, swollen lymph nodes,, joint swelling, chest pain, shortness of breath, mood changes. POSITIVE muscle aches, body aches  Objective  Blood pressure (!) 146/78, pulse 76, height _0  (1.702 m), weight 184 lb (83.5 kg), SpO2 98 %.   General: No apparent distress alert and oriented x3 mood and affect normal, dressed appropriately.  HEENT: Pupils equal, extraocular movements intact  Respiratory: Patient's speak in full sentences and does not appear short of breath  Cardiovascular: No lower extremity edema, non tender, no erythema  Gait severely antalgic favoring the left leg. Left knee has significant varus deformity of the knee noted.  Patient does have instability with valgus and varus force.  Only 95 degrees of flexion noted.  Effusion noted of the patellofemoral joint. Left ankle also has some mild swelling noted.  Patient does have limited range of motion in all planes.  Tender to palpation more over the lateral joint line of the talus.  After informed written and verbal consent, patient was seated on exam table. Left knee was prepped with alcohol swab and utilizing anterolateral approach, patient's left knee space was injected with 4:1  marcaine 0.5%: Kenalog 15m/dL. Patient tolerated the procedure well without immediate complications.    Impression and Recommendations:     The above documentation has been reviewed and is accurate and complete ZLyndal Pulley DO

## 2021-06-24 ENCOUNTER — Ambulatory Visit (INDEPENDENT_AMBULATORY_CARE_PROVIDER_SITE_OTHER): Payer: Medicare Other | Admitting: Family Medicine

## 2021-06-24 ENCOUNTER — Other Ambulatory Visit: Payer: Self-pay

## 2021-06-24 ENCOUNTER — Encounter: Payer: Self-pay | Admitting: Family Medicine

## 2021-06-24 ENCOUNTER — Ambulatory Visit (INDEPENDENT_AMBULATORY_CARE_PROVIDER_SITE_OTHER): Payer: Medicare Other

## 2021-06-24 VITALS — BP 146/78 | HR 76 | Ht 67.0 in | Wt 184.0 lb

## 2021-06-24 DIAGNOSIS — M1712 Unilateral primary osteoarthritis, left knee: Secondary | ICD-10-CM

## 2021-06-24 DIAGNOSIS — M19072 Primary osteoarthritis, left ankle and foot: Secondary | ICD-10-CM

## 2021-06-24 NOTE — Assessment & Plan Note (Addendum)
Chronic problem with exacerbation.  Patient is having increasing discomfort and pain again.  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  Given injection today to help with some pain relief as well.  Discussed icing regimen and home exercises.  We will get approval for viscosupplementation.  Patient could also be a candidate for possible PRP.  Follow-up with me again in 6 weeks

## 2021-06-24 NOTE — Assessment & Plan Note (Signed)
Does have significant arthritic changes of the ankle.  This time every year seems to have some aggravation.  Discussed recovery sandals, home exercises, topical anti-inflammatories.  Do not think bracing would make significant improvement.  No significant swelling noted today and more just the arthritic changes.  Follow-up with me again in 6 to 8 weeks.  Likely compensating for patient's severe arthritic changes and instability of the knee

## 2021-06-24 NOTE — Patient Instructions (Signed)
Xrays Today Recovery Sandals ([place]ofos or [place]) Do prescribed exercises at least 3x a week  See you again in 6-8 weeks

## 2021-06-28 ENCOUNTER — Other Ambulatory Visit: Payer: Self-pay | Admitting: Family Medicine

## 2021-06-28 DIAGNOSIS — Z1231 Encounter for screening mammogram for malignant neoplasm of breast: Secondary | ICD-10-CM

## 2021-07-16 DIAGNOSIS — H538 Other visual disturbances: Secondary | ICD-10-CM | POA: Diagnosis not present

## 2021-07-16 DIAGNOSIS — H9190 Unspecified hearing loss, unspecified ear: Secondary | ICD-10-CM | POA: Diagnosis not present

## 2021-07-16 DIAGNOSIS — R42 Dizziness and giddiness: Secondary | ICD-10-CM | POA: Diagnosis not present

## 2021-07-16 DIAGNOSIS — R519 Headache, unspecified: Secondary | ICD-10-CM | POA: Diagnosis not present

## 2021-07-20 DIAGNOSIS — N1832 Chronic kidney disease, stage 3b: Secondary | ICD-10-CM | POA: Diagnosis not present

## 2021-07-26 DIAGNOSIS — N1832 Chronic kidney disease, stage 3b: Secondary | ICD-10-CM | POA: Diagnosis not present

## 2021-07-26 DIAGNOSIS — I10 Essential (primary) hypertension: Secondary | ICD-10-CM | POA: Diagnosis not present

## 2021-07-26 DIAGNOSIS — N2581 Secondary hyperparathyroidism of renal origin: Secondary | ICD-10-CM | POA: Diagnosis not present

## 2021-08-03 ENCOUNTER — Other Ambulatory Visit: Payer: Self-pay

## 2021-08-03 ENCOUNTER — Encounter: Payer: Self-pay | Admitting: Family Medicine

## 2021-08-03 ENCOUNTER — Telehealth: Payer: Self-pay | Admitting: Family Medicine

## 2021-08-03 ENCOUNTER — Ambulatory Visit (INDEPENDENT_AMBULATORY_CARE_PROVIDER_SITE_OTHER): Payer: Medicare Other | Admitting: Family Medicine

## 2021-08-03 VITALS — BP 130/68 | HR 70 | Temp 99.1°F | Ht 67.0 in | Wt 181.2 lb

## 2021-08-03 DIAGNOSIS — L989 Disorder of the skin and subcutaneous tissue, unspecified: Secondary | ICD-10-CM | POA: Diagnosis not present

## 2021-08-03 DIAGNOSIS — R519 Headache, unspecified: Secondary | ICD-10-CM | POA: Diagnosis not present

## 2021-08-03 DIAGNOSIS — I10 Essential (primary) hypertension: Secondary | ICD-10-CM | POA: Diagnosis not present

## 2021-08-03 DIAGNOSIS — I6789 Other cerebrovascular disease: Secondary | ICD-10-CM | POA: Diagnosis not present

## 2021-08-03 DIAGNOSIS — M19072 Primary osteoarthritis, left ankle and foot: Secondary | ICD-10-CM | POA: Diagnosis not present

## 2021-08-03 DIAGNOSIS — E785 Hyperlipidemia, unspecified: Secondary | ICD-10-CM

## 2021-08-03 LAB — HEPATIC FUNCTION PANEL
ALT: 14 U/L (ref 0–35)
AST: 15 U/L (ref 0–37)
Albumin: 4.1 g/dL (ref 3.5–5.2)
Alkaline Phosphatase: 62 U/L (ref 39–117)
Bilirubin, Direct: 0.1 mg/dL (ref 0.0–0.3)
Total Bilirubin: 0.4 mg/dL (ref 0.2–1.2)
Total Protein: 7 g/dL (ref 6.0–8.3)

## 2021-08-03 LAB — LIPID PANEL
Cholesterol: 202 mg/dL — ABNORMAL HIGH (ref 0–200)
HDL: 66.2 mg/dL (ref >39.00)
LDL Cholesterol: 121 mg/dL — ABNORMAL HIGH (ref 0–99)
NonHDL: 135.54
Total CHOL/HDL Ratio: 3
Triglycerides: 74 mg/dL (ref 0.0–149.0)
VLDL: 14.8 mg/dL (ref 0.0–40.0)

## 2021-08-03 NOTE — Assessment & Plan Note (Signed)
The patient will be referred to dermatology to evaluate this area further.

## 2021-08-03 NOTE — Telephone Encounter (Signed)
Please let the patient know I heard back from neurology.  They advised that the findings on the MRI report were likely age-related changes.  We will continue to monitor risk factors as we discussed during her visit.

## 2021-08-03 NOTE — Assessment & Plan Note (Signed)
I discussed with the patient that we just checked her cholesterol less than 3 months ago and that checking it again would likely not change management.  I also discussed that her insurance may not pay for repeat cholesterol check.  She still wanted to proceed with checking her cholesterol at this time.

## 2021-08-03 NOTE — Assessment & Plan Note (Signed)
The patient likely has arthritis in her left ankle.  She may have some peroneal tendinitis as well.  Discussed icing as well as using Tylenol over-the-counter.  If it continues to bother her she could see sports medicine.

## 2021-08-03 NOTE — Telephone Encounter (Signed)
-----   Message from Alda Berthold, DO sent at 08/03/2021  2:09 PM EST ----- [place],  Thanks for your note. This is most likely age-related changes. These are very common and nonspecific findings, especially over the age of 35.   I was not able to pull the images from [facility], but the characterization of white matter changes does not raise concern for demyelinating disease. I would reassure the patient and let her know it's age appropriate findings.   Hope that helps,  Donika  ----- Message ----- From: [name], MD Sent: 08/03/2021   1:42 PM EST To: Alda Berthold, DO  Hi Donika,   I wanted to see if you could help me with a question on this patient. I saw her in September for new onset headaches/dizziness. She had some mild vision change on her eye check earlier this year. The headaches were going on for 2 months. She had an ENT evaluation for the dizziness though noted no intervention was given by ENT. I got an MRI brain given her age, persistent/new issues with the headaches. The MRI gave no cause for the headaches and her headaches have resolved and dizziness has improved significantly. My question surrounds the incidental findings on the MRI. "There are a few abnormal increased T2-weighted signal intensities in the subcortical and periventricular white matter regions of both cerebral hemispheres. These are nonspecific." They noted demyelinization/chronic ischemia as a possible causes of the findings and I just wanted to check to see if there was additional evaluation that was needed other than risk factor management. I am happy to provide more details if needed. Randall Hiss

## 2021-08-03 NOTE — Assessment & Plan Note (Addendum)
Discussed that the MRI findings likely represent microvascular changes.  I will send a message to one of our neurologist to get their input to see if any further evaluation is needed other than risk factor management.  Discussed risk factor management.  We are checking her cholesterol today.  We will continue to monitor blood sugar and her blood pressure.

## 2021-08-03 NOTE — Telephone Encounter (Signed)
I called and spoke with the patient and informed her that the provider spoke with the neurologist and the findings on her MRI was age-related and we will continue to monitor things due to her risk factors and she understood.  Tabby Beaston,cma

## 2021-08-03 NOTE — Patient Instructions (Signed)
Nice to see you. We will contact you with your lab results. 

## 2021-08-03 NOTE — Assessment & Plan Note (Signed)
Adequately controlled.  She will continue amlodipine 5 mg once daily.

## 2021-08-03 NOTE — Assessment & Plan Note (Signed)
Headaches have resolved.  Dizziness has improved.  MRI did not reveal a cause for headaches.  She will monitor for recurrence.

## 2021-08-03 NOTE — Progress Notes (Signed)
[name], MD Phone: [phone]  Lindsey Foster is a 75 y.o. female who presents today for follow-up.  HYPERTENSION Disease Monitoring Home BP Monitoring similar to today Chest pain- no    Dyspnea- no Medications Compliance-  taking amlodipine 5 mg daily.  Edema- no BMET    Component Value Date/Time   NA 140 05/10/2021 1117   NA 142 07/04/2014 1041   K 4.1 05/10/2021 1117   K 3.9 07/04/2014 1041   CL 103 05/10/2021 1117   CL 108 (H) 07/04/2014 1041   CO2 31 05/10/2021 1117   CO2 30 07/04/2014 1041   GLUCOSE 100 (H) 05/10/2021 1117   GLUCOSE 86 07/04/2014 1041   BUN 16 05/10/2021 1117   BUN 12 07/04/2014 1041   CREATININE 1.32 (H) 05/10/2021 1117   CREATININE 1.18 07/04/2014 1041   CALCIUM 9.8 05/10/2021 1117   CALCIUM 8.8 07/04/2014 1041   GFRNONAA 47 (L) 08/13/2020 1015   GFRNONAA 48 (L) 07/04/2014 1041   GFRNONAA 42 (L) 07/04/2013 1048   GFRAA 50 (L) 04/13/2020 1243   GFRAA 58 (L) 07/04/2014 1041   GFRAA 49 (L) 07/04/2013 1048   Headache/dizziness: Patient notes no additional headaches.  She notes the dizziness is significantly better.  She had an MRI that revealed no acute changes though did reveal nonspecific white matter lesions that were favored to represent chronic microvascular changes versus age-related changes.  She notes no numbness, weakness, or tingling.  She has had some progressive vision changes and notes she needs to see the eye doctor.  She notes words seem fractured at times and notes that has been attributed to her astigmatism.  Joint pain: Patient notes left knee and left ankle discomfort earlier this year.  She had a cortisone injection in her left knee with good benefit.  She note sports medicine advised she could take Tylenol for the discomfort in her ankle and that has been beneficial.  She has had some slight swelling in her ankle laterally.  She notes that overall this has improved significantly.  Skin lesion: This is on her nose at  the tip.  She previously saw dermatology and notes they advised it was nonspecific.  She notes its been getting irritated with having to wear masks.  It itches at times.  She plans to see dermatology for this.  Hyperlipidemia: The patient request to check her lipid panel today. She notes she would consider taking medicine if needed.    Social History   Tobacco Use  Smoking Status Former  Smokeless Tobacco Never    Current Outpatient Medications on File Prior to Visit  Medication Sig Dispense Refill   amLODipine (NORVASC) 5 MG tablet Take 1 tablet (5 mg total) by mouth daily. 90 tablet 3   Calcium Citrate-Vitamin D3 1000-400 LIQD Take 1 tablet by mouth daily. Reported on 02/04/2016     clobetasol cream (TEMOVATE) 0.05 % Apply topically 2 (two) times daily. For 2 weeks, then just on week days as needed 60 g 1   famotidine (PEPCID) 20 MG tablet TAKE 1 TABLET(20 MG) BY MOUTH TWICE DAILY BEFORE A MEAL 180 tablet 1   fluticasone (FLONASE SENSIMIST) 27.5 MCG/SPRAY nasal spray Place 1 spray into the nose daily as needed.      Garlic 829 MG TABS Take 1 tablet by mouth daily.      ketotifen (ZADITOR) 0.025 % ophthalmic solution 1 drop 2 (two) times daily.     loratadine (CLARITIN) 10 MG tablet Take 1 tablet (10 mg  total) by mouth daily. 30 tablet 11   mometasone (ELOCON) 0.1 % lotion Apply topically daily.     No current facility-administered medications on file prior to visit.     ROS see history of present illness  Objective  Physical Exam Vitals:   08/03/21 0946  BP: 130/68  Pulse: 70  Temp: 99.1 F (37.3 C)  SpO2: 96%    BP Readings from Last 3 Encounters:  08/03/21 130/68  06/24/21 (!) 146/78  05/10/21 140/80   Wt Readings from Last 3 Encounters:  08/03/21 181 lb 3.2 oz (82.2 kg)  06/24/21 184 lb (83.5 kg)  05/10/21 184 lb 9.6 oz (83.7 kg)    Physical Exam Constitutional:      General: She is not in acute distress.    Appearance: She is not diaphoretic.   Cardiovascular:     Rate and Rhythm: Normal rate and regular rhythm.     Heart sounds: Normal heart sounds.  Pulmonary:     Effort: Pulmonary effort is normal.     Breath sounds: Normal breath sounds.  Musculoskeletal:     Comments: Left ankle with some slight soft tissue swelling laterally, there is slight discomfort to palpation along the course of the peroneal tendons in the left ankle, there is no discomfort on resisted inversion, eversion, plantarflexion, or dorsiflexion of her left ankle  Skin:    General: Skin is warm and dry.     Comments: The patient has a small erythematous papule on the right tip of her nose  Neurological:     Mental Status: She is alert.     Assessment/Plan: Please see individual problem list.  Problem List Items Addressed This Visit     Arthritis of left ankle    The patient likely has arthritis in her left ankle.  She may have some peroneal tendinitis as well.  Discussed icing as well as using Tylenol over-the-counter.  If it continues to bother her she could see sports medicine.      Cerebral microvascular disease    Discussed that the MRI findings likely represent microvascular changes.  I will send a message to one of our neurologist to get their input to see if any further evaluation is needed other than risk factor management.  Discussed risk factor management.  We are checking her cholesterol today.  We will continue to monitor blood sugar and her blood pressure.      Essential hypertension - Primary    Adequately controlled.  She will continue amlodipine 5 mg once daily.      Hyperlipidemia    I discussed with the patient that we just checked her cholesterol less than 3 months ago and that checking it again would likely not change management.  I also discussed that her insurance may not pay for repeat cholesterol check.  She still wanted to proceed with checking her cholesterol at this time.      Relevant Orders   Lipid panel   Hepatic  function panel   New onset of headaches after age 47    Headaches have resolved.  Dizziness has improved.  MRI did not reveal a cause for headaches.  She will monitor for recurrence.      Skin lesion    The patient will be referred to dermatology to evaluate this area further.      Relevant Orders   Ambulatory referral to Dermatology   Return in about 3 months (around 11/01/2021).  This visit occurred during the SARS-CoV-2 public  health emergency.  Safety protocols were in place, including screening questions prior to the visit, additional usage of staff PPE, and extensive cleaning of exam room while observing appropriate contact time as indicated for disinfecting solutions.   I have spent 45 minutes in the care of this patient regarding History taking, documentation, completion of exam, placing orders, sending a message to neurology.   [name], MD [place]

## 2021-08-04 ENCOUNTER — Ambulatory Visit
Admission: RE | Admit: 2021-08-04 | Discharge: 2021-08-04 | Disposition: A | Discharge status: Home / Self Care | Payer: Medicare Other | Source: Ambulatory Visit | Attending: Family Medicine | Admitting: Family Medicine

## 2021-08-04 DIAGNOSIS — Z1231 Encounter for screening mammogram for malignant neoplasm of breast: Secondary | ICD-10-CM | POA: Insufficient documentation

## 2021-08-04 NOTE — Progress Notes (Signed)
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  This time every year seems to have some aggravation.  Discussed recovery sandals, home exercises, topical anti-inflammatories.  Do not think bracing would make significant improvement.  No significant swelling noted today and more just the arthritic changes.  Follow-up with me again in 6 to 8 weeks.  Likely compensating for patient's severe arthritic changes and instability of the knee  Chronic problem with exacerbation.  Patient is having increasing discomfort and pain again.  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  Given injection today to help with some pain relief as well.  Discussed icing regimen and home exercises.  We will get approval for viscosupplementation.  Patient could also be a candidate for possible PRP.  Follow-up with me again in 6 weeks  Update 08/05/2021 Lindsey Foster is a 75 y.o. female coming in with complaint of L knee and L ankle pain. Patient states ankles hurt more than knees, but doing better. Tylenol helps. Believes last steroid injection helped.  Patient states very limited Not stopping her from activity     Past Medical History:  Diagnosis Date   Allergic rhinitis    Anxiety    panic attacks in hot rooms   Arthritis    Blood in stool    Breast cancer ([place]) 2006   radiation - Rt   Chickenpox    CKD (chronic kidney disease)    Colon polyps    Diverticulosis    GERD (gastroesophageal reflux disease)    Heart murmur    Hyperlipidemia    Hypertension    Hypothyroidism    Multiple thyroid nodules    Osteopenia    Personal history of radiation therapy     Urinary incontinence    UTI (lower urinary tract infection)    Vitamin D deficiency disease    Past Surgical History:  Procedure Laterality Date   BLADDER SURGERY     BREAST EXCISIONAL BIOPSY Right 2006   positive   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   CESAREAN SECTION  1985   COLONOSCOPY N/A 04/30/2021   Procedure: COLONOSCOPY;  Surgeon: [name], MD;  Location: [facility] ENDOSCOPY;  Service: Endoscopy;  Laterality: N/A;   COLONOSCOPY WITH PROPOFOL N/A 09/28/2015   Procedure: COLONOSCOPY WITH PROPOFOL;  Surgeon: Lollie Sails, MD;  [facility] END[facility]COPY;  Service: Endoscopy;  Laterality: N/A;   DILATION AND CURETTAGE OF UTERUS  2012   Thyroid nodule ablation  2003   Uterine polyp removal  2009    Social History   Socioeconomic History   Marital status: Widowed    Spouse name: Not on file   Number of children: Not on file   Years of education: Not on file   Highest education level: Not on file  Occupational History   Not on file  Tobacco Use   Smoking status: Former   Smokeless tobacco: Never  Vaping Use   Vaping Use: Never used  Substance and Sexual Activity   Alcohol use: No    Alcohol/week: 0.0 standard drinks   Drug use: No   Sexual activity: Not on file  Other Topics  Concern   Not on file  Social History Narrative   Not on file   Social Determinants of Health   Financial Resource Strain: Low Risk    Difficulty of Paying Living Expenses: Not hard at all  Food Insecurity: No Food Insecurity   Worried About Charity fundraiser in the Last Year: Never true   [place] in the Last Year: Never true  Transportation Needs: No Transportation Needs   Lack of Transportation (Medical): No   Lack of Transportation (Non-Medical): No  Physical Activity: Insufficiently Active   Days of Exercise per Week: 2 days   Minutes of Exercise per Session: 30 min  Stress: No Stress Concern Present   Feeling of Stress : Not at all  Social Connections: Unknown    Frequency of Communication with Friends and Family: More than three times a week   Frequency of Social Gatherings with Friends and Family: Not on file   Attends Religious Services: Not on Electrical engineer or Organizations: Not on file   Attends Archivist Meetings: Not on file   Marital Status: Not on file   Allergies  Allergen Reactions   Latex Rash   Family History  Problem Relation Age of Onset   Heart disease Other        Parent   Kidney disease Other        Parent   Uterine cancer Maternal Aunt    Kidney disease Mother    Angina Father    Heart attack Father    Multiple myeloma Brother    Breast cancer Neg Hx      Current Outpatient Medications (Cardiovascular):    amLODipine (NORVASC) 5 MG tablet, Take 1 tablet (5 mg total) by mouth daily.  Current Outpatient Medications (Respiratory):    fluticasone (FLONASE SENSIMIST) 27.5 MCG/SPRAY nasal spray, Place 1 spray into the nose daily as needed.    loratadine (CLARITIN) 10 MG tablet, Take 1 tablet (10 mg total) by mouth daily.    Current Outpatient Medications (Other):    Calcium Citrate-Vitamin D3 1000-400 LIQD, Take 1 tablet by mouth daily. Reported on 02/04/2016   clobetasol cream (TEMOVATE) 0.05 %, Apply topically 2 (two) times daily. For 2 weeks, then just on week days as needed   famotidine (PEPCID) 20 MG tablet, TAKE 1 TABLET(20 MG) BY MOUTH TWICE DAILY BEFORE A MEAL   Garlic 761 MG TABS, Take 1 tablet by mouth daily.    ketotifen (ZADITOR) 0.025 % ophthalmic solution, 1 drop 2 (two) times daily.   mometasone (ELOCON) 0.1 % lotion, Apply topically daily.     Objective  Blood pressure 136/70, pulse 71, height $RemoveBe'5\' 7"'jWFGSXTQo$  (1.702 m), weight 183 lb (83 kg), SpO2 97 %.   General: No apparent distress alert and oriented x3 mood and affect normal, dressed appropriately.  HEENT: Pupils equal, extraocular movements intact  Respiratory: Patient's speak in full sentences and does not appear short of  breath  Cardiovascular: Trace lower extremity edema, non tender, no erythema  Gait n antalgic MSK: Patient does favor the left knee.  Does have some instability noted of the left left knee.  Left ankle does have arthritic changes.  Overpronation of the hindfoot noted.    Impression and Recommendations:     The above documentation has been reviewed and is accurate and complete Lyndal Pulley, DO

## 2021-08-05 ENCOUNTER — Ambulatory Visit (INDEPENDENT_AMBULATORY_CARE_PROVIDER_SITE_OTHER): Payer: Medicare Other | Admitting: Family Medicine

## 2021-08-05 ENCOUNTER — Other Ambulatory Visit: Payer: Self-pay | Admitting: *Deleted

## 2021-08-05 ENCOUNTER — Other Ambulatory Visit: Payer: Self-pay

## 2021-08-05 DIAGNOSIS — M1712 Unilateral primary osteoarthritis, left knee: Secondary | ICD-10-CM

## 2021-08-05 DIAGNOSIS — D0511 Intraductal carcinoma in situ of right breast: Secondary | ICD-10-CM

## 2021-08-05 DIAGNOSIS — M810 Age-related osteoporosis without current pathological fracture: Secondary | ICD-10-CM

## 2021-08-05 DIAGNOSIS — M19072 Primary osteoarthritis, left ankle and foot: Secondary | ICD-10-CM | POA: Diagnosis not present

## 2021-08-05 NOTE — Patient Instructions (Signed)
Good to see you! Have a wonderful Christmas Holiday See you again in 2-3 months

## 2021-08-05 NOTE — Assessment & Plan Note (Signed)
Stable overall.  Discussed continuing the proper shoes.  Discussed the compression.  Follow-up again in 6 weeks

## 2021-08-05 NOTE — Assessment & Plan Note (Signed)
Patient is doing well at this time.  Feels the steroid injection was beneficial.  We will hold on the viscosupplementation but I do think that that can be helpful as well.  Follow-up with me again in 2 to 3 months

## 2021-08-12 ENCOUNTER — Other Ambulatory Visit: Payer: Self-pay

## 2021-08-12 ENCOUNTER — Inpatient Hospital Stay (HOSPITAL_BASED_OUTPATIENT_CLINIC_OR_DEPARTMENT_OTHER): Payer: Medicare Other | Admitting: Oncology

## 2021-08-12 ENCOUNTER — Other Ambulatory Visit: Payer: Medicare Other

## 2021-08-12 ENCOUNTER — Encounter: Payer: Self-pay | Admitting: Oncology

## 2021-08-12 ENCOUNTER — Ambulatory Visit: Payer: Medicare Other | Admitting: Oncology

## 2021-08-12 ENCOUNTER — Inpatient Hospital Stay: Payer: Medicare Other | Attending: Oncology

## 2021-08-12 VITALS — BP 146/81 | HR 61 | Temp 97.2°F | Wt 183.5 lb

## 2021-08-12 DIAGNOSIS — Z923 Personal history of irradiation: Secondary | ICD-10-CM | POA: Diagnosis not present

## 2021-08-12 DIAGNOSIS — Z20822 Contact with and (suspected) exposure to covid-19: Secondary | ICD-10-CM | POA: Diagnosis not present

## 2021-08-12 DIAGNOSIS — D0511 Intraductal carcinoma in situ of right breast: Secondary | ICD-10-CM

## 2021-08-12 DIAGNOSIS — N1831 Chronic kidney disease, stage 3a: Secondary | ICD-10-CM | POA: Insufficient documentation

## 2021-08-12 DIAGNOSIS — M810 Age-related osteoporosis without current pathological fracture: Secondary | ICD-10-CM

## 2021-08-12 DIAGNOSIS — Z807 Family history of other malignant neoplasms of lymphoid, hematopoietic and related tissues: Secondary | ICD-10-CM | POA: Diagnosis not present

## 2021-08-12 DIAGNOSIS — Z86000 Personal history of in-situ neoplasm of breast: Secondary | ICD-10-CM | POA: Diagnosis not present

## 2021-08-12 LAB — COMPREHENSIVE METABOLIC PANEL
ALT: 17 U/L (ref 0–44)
AST: 19 U/L (ref 15–41)
Albumin: 4 g/dL (ref 3.5–5.0)
Alkaline Phosphatase: 63 U/L (ref 38–126)
Anion gap: 7 (ref 5–15)
BUN: 18 mg/dL (ref 8–23)
CO2: 30 mmol/L (ref 22–32)
Calcium: 9.4 mg/dL (ref 8.9–10.3)
Chloride: 100 mmol/L (ref 98–111)
Creatinine, Ser: 1.15 mg/dL — ABNORMAL HIGH (ref 0.44–1.00)
GFR, Estimated: 49 mL/min — ABNORMAL LOW (ref >60)
Glucose, Bld: 95 mg/dL (ref 70–99)
Potassium: 4 mmol/L (ref 3.5–5.1)
Sodium: 137 mmol/L (ref 135–145)
Total Bilirubin: 0.5 mg/dL (ref 0.3–1.2)
Total Protein: 7.3 g/dL (ref 6.5–8.1)

## 2021-08-12 LAB — CBC WITH DIFFERENTIAL/PLATELET
Abs Immature Granulocytes: 0.01 10*3/uL (ref 0.00–0.07)
Basophils Absolute: 0 10*3/uL (ref 0.0–0.1)
Basophils Relative: 1 %
Eosinophils Absolute: 0.1 10*3/uL (ref 0.0–0.5)
Eosinophils Relative: 2 %
HCT: 38.4 % (ref 36.0–46.0)
Hemoglobin: 12.2 g/dL (ref 12.0–15.0)
Immature Granulocytes: 0 %
Lymphocytes Relative: 33 %
Lymphs Abs: 1.5 10*3/uL (ref 0.7–4.0)
MCH: 27.4 pg (ref 26.0–34.0)
MCHC: 31.8 g/dL (ref 30.0–36.0)
MCV: 86.1 fL (ref 80.0–100.0)
Monocytes Absolute: 0.6 10*3/uL (ref 0.1–1.0)
Monocytes Relative: 13 %
Neutro Abs: 2.3 10*3/uL (ref 1.7–7.7)
Neutrophils Relative %: 51 %
Platelets: 229 10*3/uL (ref 150–400)
RBC: 4.46 MIL/uL (ref 3.87–5.11)
RDW: 14.6 % (ref 11.5–15.5)
WBC: 4.5 10*3/uL (ref 4.0–10.5)
nRBC: 0 % (ref 0.0–0.2)

## 2021-08-12 NOTE — Progress Notes (Signed)
Clinic Day:  08/12/2021  Referring physician: [name], MD  Chief Complaint: Lindsey Foster is a 75 y.o. female with a history of DCIS in the right breast presents to follow up   Lindsey Foster is a 74 y.o. female who has above history reviewed by me today presents for follow up visit for history of DCIS.  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  She received radiation to the right breast from 08/2005 - 09/2005 at the [facility] in [place].  She then received tamoxifen for 5 years (12/2005-11/2010).  Today patient denies any new complaints. Occasional she has itchiness of right breast an axillary.     Past Medical History:  Diagnosis Date   Allergic rhinitis    Anxiety    panic attacks in hot rooms   Arthritis    Blood in stool    Breast cancer ([place]) 2006   radiation - Rt   Chickenpox    CKD (chronic kidney disease)    Colon polyps    Diverticulosis    GERD (gastroesophageal reflux disease)    Heart murmur    Hyperlipidemia    Hypertension    Hypothyroidism    Multiple thyroid nodules    Osteopenia    Personal history of radiation therapy    Urinary incontinence    UTI (lower urinary tract infection)    Vitamin D deficiency disease     Past Surgical History:  Procedure Laterality Date   BLADDER SURGERY     BREAST EXCISIONAL BIOPSY Right 2006   positive   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   CESAREAN SECTION  1985   COLONOSCOPY N/A 04/30/2021   Procedure: COLONOSCOPY;  Surgeon: [name], MD;  Location: [facility] ENDOSCOPY;  Service: Endoscopy;  Laterality: N/A;   COLONOSCOPY WITH PROPOFOL N/A 09/28/2015   Procedure: COLONOSCOPY WITH PROPOFOL;  Surgeon: Lollie Sails, MD;  Location: [facility] ENDOSCOPY;  Service: Endoscopy;  Laterality: N/A;    DILATION AND CURETTAGE OF UTERUS  2012   Thyroid nodule ablation  2003   Uterine polyp removal  2009     Family History  Problem Relation Age of Onset   Heart disease Other        Parent   Kidney disease Other        Parent   Uterine cancer Maternal Aunt    Kidney disease Mother    Angina Father    Heart attack Father    Multiple myeloma Brother    Breast cancer Neg Hx     Social History:  reports that she has quit smoking. She has never used smokeless tobacco. She reports that she does not drink alcohol and does not use drugs.The patient is alone today.  Allergies:  Allergies  Allergen Reactions   Latex Rash    Current Medications: Current Outpatient Medications  Medication Sig Dispense Refill   amLODipine (NORVASC) 5 MG tablet Take 1 tablet (5 mg total) by mouth daily. 90 tablet 3   famotidine (PEPCID) 20 MG tablet TAKE 1 TABLET(20 MG) BY MOUTH TWICE DAILY BEFORE A MEAL 180 tablet 1   fluticasone (FLONASE SENSIMIST) 27.5 MCG/SPRAY nasal spray Place 1 spray into the nose daily as needed.      Garlic 832 MG TABS Take 1 tablet by mouth daily.  ketotifen (ZADITOR) 0.025 % ophthalmic solution 1 drop 2 (two) times daily.     loratadine (CLARITIN) 10 MG tablet Take 1 tablet (10 mg total) by mouth daily. 30 tablet 11   Calcium Citrate-Vitamin D3 1000-400 LIQD Take 1 tablet by mouth daily. Reported on 02/04/2016 (Patient not taking: Reported on 08/12/2021)     clobetasol cream (TEMOVATE) 0.05 % Apply topically 2 (two) times daily. For 2 weeks, then just on week days as needed (Patient not taking: Reported on 08/12/2021) 60 g 1   mometasone (ELOCON) 0.1 % lotion Apply topically daily. (Patient not taking: Reported on 08/12/2021)     No current facility-administered medications for this visit.    Review of Systems  Constitutional:  Negative for chills, diaphoresis, fever, malaise/fatigue and weight loss.  HENT:  Negative for congestion, ear discharge, ear pain, hearing loss,  nosebleeds, sinus pain, sore throat and tinnitus.   Eyes:  Negative for blurred vision.  Respiratory:  Negative for cough, hemoptysis, sputum production and shortness of breath.   Cardiovascular:  Negative for chest pain, palpitations and leg swelling.  Gastrointestinal:  Negative for abdominal pain, blood in stool, constipation, diarrhea, heartburn, melena, nausea and vomiting.  Genitourinary:  Negative for dysuria, frequency, hematuria and urgency.  Musculoskeletal:  Negative for back pain, joint pain (arthritis in left knee), myalgias and neck pain.  Skin:  Positive for itching (whole body at night). Negative for rash.  Neurological:  Negative for dizziness, tingling, sensory change, weakness and headaches.  Endo/Heme/Allergies:  Does not bruise/bleed easily.  Psychiatric/Behavioral:  Negative for depression and memory loss. The patient is not nervous/anxious and does not have insomnia.   All other systems reviewed and are negative. Performance status (ECOG): 0  Vitals Blood pressure (!) 146/81, pulse 61, temperature (!) 97.2 F (36.2 C), temperature source Tympanic, weight 183 lb 8 oz (83.2 kg).   Physical Exam Vitals and nursing note reviewed.  Constitutional:      General: She is not in acute distress.    Appearance: She is not diaphoretic.  HENT:     Head: Normocephalic and atraumatic.     Mouth/Throat:     Mouth: Mucous membranes are moist.     Pharynx: Oropharynx is clear.  Eyes:     General: No scleral icterus.    Extraocular Movements: Extraocular movements intact.     Conjunctiva/sclera: Conjunctivae normal.     Pupils: Pupils are equal, round, and reactive to light.  Cardiovascular:     Rate and Rhythm: Normal rate and regular rhythm.     Heart sounds: Normal heart sounds. No murmur heard. Pulmonary:     Effort: Pulmonary effort is normal. No respiratory distress.     Breath sounds: Normal breath sounds. No wheezing or rales.  Chest:     Chest wall: No tenderness.   Breasts:    Right: Skin change (scarring around nipple from 3-6 o'clock) present. No swelling, bleeding, mass or tenderness.     Left: No swelling, bleeding, mass, skin change or tenderness.  Abdominal:     General: Bowel sounds are normal. There is no distension.     Palpations: Abdomen is soft. There is no hepatomegaly, splenomegaly or mass.     Tenderness: There is no abdominal tenderness. There is no guarding or rebound.  Musculoskeletal:        General: No swelling or tenderness. Normal range of motion.     Cervical back: Normal range of motion and neck supple.  Lymphadenopathy:  Head:     Right side of head: No preauricular, posterior auricular or occipital adenopathy.     Left side of head: No preauricular, posterior auricular or occipital adenopathy.     Cervical: No cervical adenopathy.     Upper Body:     Right upper body: No supraclavicular or axillary adenopathy.     Left upper body: No supraclavicular or axillary adenopathy.     Lower Body: No right inguinal adenopathy. No left inguinal adenopathy.  Skin:    General: Skin is warm and dry.  Neurological:     Mental Status: She is alert and oriented to person, place, and time.  Psychiatric:        Behavior: Behavior normal.        Thought Content: Thought content normal.        Judgment: Judgment normal.   Appointment on 08/12/2021  Component Date Value Ref Range Status   Sodium 08/12/2021 137  135 - 145 mmol/L Final   Potassium 08/12/2021 4.0  3.5 - 5.1 mmol/L Final   Chloride 08/12/2021 100  98 - 111 mmol/L Final   CO2 08/12/2021 30  22 - 32 mmol/L Final   Glucose, Bld 08/12/2021 95  70 - 99 mg/dL Final   Glucose reference range applies only to samples taken after fasting for at least 8 hours.   BUN 08/12/2021 18  8 - 23 mg/dL Final   Creatinine, Ser 08/12/2021 1.15 (H)  0.44 - 1.00 mg/dL Final   Calcium 08/12/2021 9.4  8.9 - 10.3 mg/dL Final   Total Protein 08/12/2021 7.3  6.5 - 8.1 g/dL Final   Albumin  08/12/2021 4.0  3.5 - 5.0 g/dL Final   AST 08/12/2021 19  15 - 41 U/L Final   ALT 08/12/2021 17  0 - 44 U/L Final   Alkaline Phosphatase 08/12/2021 63  38 - 126 U/L Final   Total Bilirubin 08/12/2021 0.5  0.3 - 1.2 mg/dL Final   GFR, Estimated 08/12/2021 49 (L)  >60 mL/min Final   Comment: (NOTE) Calculated using the CKD-EPI Creatinine Equation (2021)    Anion gap 08/12/2021 7  5 - 15 Final   Performed at [facility], [place], [place] [address]   WBC 08/12/2021 4.5  4.0 - 10.5 K/uL Final   RBC 08/12/2021 4.46  3.87 - 5.11 MIL/uL Final   Hemoglobin 08/12/2021 12.2  12.0 - 15.0 g/dL Final   HCT 08/12/2021 38.4  36.0 - 46.0 % Final   MCV 08/12/2021 86.1  80.0 - 100.0 fL Final   MCH 08/12/2021 27.4  26.0 - 34.0 pg Final   MCHC 08/12/2021 31.8  30.0 - 36.0 g/dL Final   RDW 08/12/2021 14.6  11.5 - 15.5 % Final   Platelets 08/12/2021 229  150 - 400 K/uL Final   nRBC 08/12/2021 0.0  0.0 - 0.2 % Final   Neutrophils Relative % 08/12/2021 51  % Final   Neutro Abs 08/12/2021 2.3  1.7 - 7.7 K/uL Final   Lymphocytes Relative 08/12/2021 33  % Final   Lymphs Abs 08/12/2021 1.5  0.7 - 4.0 K/uL Final   Monocytes Relative 08/12/2021 13  % Final   Monocytes Absolute 08/12/2021 0.6  0.1 - 1.0 K/uL Final   Eosinophils Relative 08/12/2021 2  % Final   Eosinophils Absolute 08/12/2021 0.1  0.0 - 0.5 K/uL Final   Basophils Relative 08/12/2021 1  % Final   Basophils Absolute 08/12/2021 0.0  0.0 - 0.1 K/uL Final  Immature Granulocytes 08/12/2021 0  % Final   Abs Immature Granulocytes 08/12/2021 0.01  0.00 - 0.07 K/uL Final   Performed at [facility], [address], [place], [place] [address]    Assessment and Plan:   1. Ductal carcinoma in situ (DCIS) of right breast   2. Osteoporosis without current pathological fracture, unspecified osteoporosis type   3. Stage 3a chronic kidney disease ([place])     # History of DCIS 08/04/2021 bilateral screening mammogram showed no  mammographic evidence of malignancy.   # Osteoporosis Bone density on 09/05/2019 revealed osteoporosis with a T score of -2.9 at the lumbar spine.  Continue calcium and vitamin D supplementation.  She is not able to tolerate Fosamax and declined Zometa and Prolia  We reviewed the unclear significance of elevated CA 27.29.  recommend to stop following this tumor marker given that she has been disease free for 15+ years.   # CKD, stage III, avoid nephrotoxins, encourage hydration.   # Bilateral screening mammogram on Dec 2023. 6.   RTC in 1 year for MD assessment, labs (CBC with diff, CMP, CA 27-29), and review of mammogram.  I discussed the assessment and treatment plan with the patient.  The patient was provided an opportunity to ask questions and all were answered.  The patient agreed with the plan and demonstrated an understanding of the instructions.  The patient was advised to call back if the symptoms worsen or if the condition fails to improve as anticipated.   Earlie Server, MD, PhD  08/12/2021

## 2021-09-27 ENCOUNTER — Ambulatory Visit
Admission: RE | Admit: 2021-09-27 | Discharge: 2021-09-27 | Disposition: A | Discharge status: Home / Self Care | Payer: Medicare Other | Source: Ambulatory Visit | Attending: Oncology | Admitting: Oncology

## 2021-09-27 ENCOUNTER — Other Ambulatory Visit: Payer: Self-pay

## 2021-09-27 DIAGNOSIS — M810 Age-related osteoporosis without current pathological fracture: Secondary | ICD-10-CM | POA: Insufficient documentation

## 2021-09-27 DIAGNOSIS — Z780 Asymptomatic menopausal state: Secondary | ICD-10-CM | POA: Diagnosis not present

## 2021-10-20 NOTE — Progress Notes (Signed)
?Charlann Boxer D.O. ?[facility] ?[place] ?Phone: [phone] ?Subjective:   ?I, Lindsey Foster, am serving as a Education administrator for Dr. Hulan Saas. ?This visit occurred during the SARS-CoV-2 public health emergency.  Safety protocols were in place, including screening questions prior to the visit, additional usage of staff PPE, and extensive cleaning of exam room while observing appropriate contact time as indicated for disinfecting solutions.  ? ?I'm seeing this patient by the request  of:  [name], MD ? ?CC: Knee and ankle pain follow-up ? ?[id number]  ?08/05/2021 ?Stable overall.  Discussed continuing the proper shoes.  Discussed the compression.  Follow-up again in 6 weeks ? ?Patient is doing well at this time.  Feels the steroid injection was beneficial.  We will hold on the viscosupplementation but I do think that that can be helpful as well.  Follow-up with me again in 2 to 3 months ? ?Update 10/21/2021 ?Lindsey Foster is a 76 y.o. female coming in with complaint of L knee and L ankle pain. Patient states doing well today, but everything about the same. No new complaints. ? ? ? ?  ? ?Past Medical History:  ?Diagnosis Date  ? Allergic rhinitis   ? Anxiety   ? panic attacks in hot rooms  ? Arthritis   ? Blood in stool   ? Breast cancer ([place]) 2006  ? radiation - Rt  ? Chickenpox   ? CKD (chronic kidney disease)   ? Colon polyps   ? Diverticulosis   ? GERD (gastroesophageal reflux disease)   ? Heart murmur   ? Hyperlipidemia   ? Hypertension   ? Hypothyroidism   ? Multiple thyroid nodules   ? Osteopenia   ? Personal history of radiation therapy   ? Urinary incontinence   ? UTI (lower urinary tract infection)   ? Vitamin D deficiency disease   ? ?Past Surgical History:  ?Procedure Laterality Date  ? BLADDER SURGERY    ? BREAST EXCISIONAL BIOPSY Right 2006  ? positive  ? BREAST LUMPECTOMY  2006  ? DCIS  ? [place]  ? COLONOSCOPY N/A 04/30/2021  ?  Procedure: COLONOSCOPY;  Surgeon: [name], MD;  Location: [facility] ENDOSCOPY;  Service: Endoscopy;  Laterality: N/A;  ? COLONOSCOPY WITH PROPOFOL N/A 09/28/2015  ? Procedure: COLONOSCOPY WITH PROPOFOL;  Surgeon: Lollie Sails, MD;  Location: [facility] ENDOSCOPY;  Service: Endoscopy;  Laterality: N/A;  ? [place] OF UTERUS  2012  ? Thyroid nodule ablation  2003  ? Uterine polyp removal  2009   ? ?Social History  ? ?Socioeconomic History  ? Marital status: Widowed  ?  Spouse name: Not on file  ? Number of children: Not on file  ? Years of education: Not on file  ? Highest education level: Not on file  ?Occupational History  ? Not on file  ?Tobacco Use  ? Smoking status: Former  ? Smokeless tobacco: Never  ?Vaping Use  ? Vaping Use: Never used  ?Substance and Sexual Activity  ? Alcohol use: No  ?  Alcohol/week: 0.0 standard drinks  ? Drug use: No  ? Sexual activity: Not on file  ?Other Topics Concern  ? Not on file  ?Social History Narrative  ? Not on file  ? ?Social Determinants of Health  ? ?Financial Resource Strain: Low Risk   ? Difficulty of Paying Living Expenses: Not hard at all  ?Food Insecurity: No Food Insecurity  ?  Worried About Charity fundraiser in the Last Year: Never true  ? Ran Out of Food in the Last Year: Never true  ?Transportation Needs: No Transportation Needs  ? Lack of Transportation (Medical): No  ? Lack of Transportation (Non-Medical): No  ?Physical Activity: Insufficiently Active  ? Days of Exercise per Week: 2 days  ? Minutes of Exercise per Session: 30 min  ?Stress: No Stress Concern Present  ? Feeling of Stress : Not at all  ?Social Connections: Unknown  ? Frequency of Communication with Friends and Family: More than three times a week  ? Frequency of Social Gatherings with Friends and Family: Not on file  ? Attends Religious Services: Not on file  ? Active Member of Clubs or Organizations: Not on file  ? Attends Archivist Meetings: Not on file  ? Marital  Status: Not on file  ? ?Allergies  ?Allergen Reactions  ? Latex Rash  ? ?Family History  ?Problem Relation Age of Onset  ? Heart disease Other   ?     Parent  ? Kidney disease Other   ?     Parent  ? Uterine cancer Maternal Aunt   ? Kidney disease Mother   ? Angina Father   ? Heart attack Father   ? Multiple myeloma Brother   ? Breast cancer Neg Hx   ? ? ? ?Current Outpatient Medications (Cardiovascular):  ?  amLODipine (NORVASC) 5 MG tablet, Take 1 tablet (5 mg total) by mouth daily. ? ?Current Outpatient Medications (Respiratory):  ?  fluticasone (FLONASE SENSIMIST) 27.5 MCG/SPRAY nasal spray, Place 1 spray into the nose daily as needed.  ?  loratadine (CLARITIN) 10 MG tablet, Take 1 tablet (10 mg total) by mouth daily. ? ? ? ?Current Outpatient Medications (Other):  ?  Calcium Citrate-Vitamin D3 1000-400 LIQD, Take 1 tablet by mouth daily. Reported on 02/04/2016 (Patient not taking: Reported on 08/12/2021) ?  clobetasol cream (TEMOVATE) 0.05 %, Apply topically 2 (two) times daily. For 2 weeks, then just on week days as needed (Patient not taking: Reported on 08/12/2021) ?  famotidine (PEPCID) 20 MG tablet, TAKE 1 TABLET(20 MG) BY MOUTH TWICE DAILY BEFORE A MEAL ?  Garlic 765 MG TABS, Take 1 tablet by mouth daily.  ?  ketotifen (ZADITOR) 0.025 % ophthalmic solution, 1 drop 2 (two) times daily. ?  mometasone (ELOCON) 0.1 % lotion, Apply topically daily. (Patient not taking: Reported on 08/12/2021) ? ? ? ?Review of Systems: ? No headache, visual changes, nausea, vomiting, diarrhea, constipation, dizziness, abdominal pain, skin rash, fevers, chills, night sweats, weight loss, swollen lymph nodes, body aches, joint swelling, chest pain, shortness of breath, mood changes. POSITIVE muscle aches ? ?Objective  ?Blood pressure 130/78, pulse 89, height $RemoveBe'5\' 7"'[id number]$  (1.702 m), weight 185 lb (83.9 kg), SpO2 96 %. ?  ?General: No apparent distress alert and oriented x3 mood and affect normal, dressed appropriately.  ?HEENT: Pupils  equal, extraocular movements intact  ?Respiratory: Patient's speak in full sentences and does not appear short of breath  ?Cardiovascular: No lower extremity edema, non tender, no erythema  ?Gait unremarkable. ?We will do some limited range of motion of the ankle noted.  Patient's knees are trace effusion noted but otherwise relatively good.  Very mild crepitus noted.  Mild lateral tracking of the patella.  Mild varus deformity of the leg that is worse than the contralateral side ? ?  ?Impression and Recommendations:  ?  ? ?The above documentation has been reviewed  and is accurate and complete Lyndal Pulley, DO ? ? ? ?

## 2021-10-21 ENCOUNTER — Ambulatory Visit (INDEPENDENT_AMBULATORY_CARE_PROVIDER_SITE_OTHER): Payer: Medicare Other | Admitting: Family Medicine

## 2021-10-21 ENCOUNTER — Other Ambulatory Visit: Payer: Self-pay

## 2021-10-21 DIAGNOSIS — M1712 Unilateral primary osteoarthritis, left knee: Secondary | ICD-10-CM | POA: Diagnosis not present

## 2021-10-21 DIAGNOSIS — M19072 Primary osteoarthritis, left ankle and foot: Secondary | ICD-10-CM | POA: Diagnosis not present

## 2021-10-21 NOTE — Patient Instructions (Addendum)
Good to see you ?Overall doing relatively well ?If any pain instability swelling you know where we are ?See you again in 2-3 months ? ?

## 2021-10-21 NOTE — Assessment & Plan Note (Signed)
Known arthritic changes.  Did not have any significant improvement with the steroid injection but is only had 1.  Patient still wants to hold on any other injections at this time.  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  Continue conservative therapy, bracing with a lot of activity, topical anti-inflammatories and icing regimen.  Follow-up with me again in 2 to 3 months otherwise. ?

## 2021-10-21 NOTE — Assessment & Plan Note (Signed)
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 ?

## 2021-11-09 ENCOUNTER — Other Ambulatory Visit: Payer: Self-pay

## 2021-11-09 ENCOUNTER — Encounter: Payer: Self-pay | Admitting: Family Medicine

## 2021-11-09 ENCOUNTER — Ambulatory Visit (INDEPENDENT_AMBULATORY_CARE_PROVIDER_SITE_OTHER): Payer: Medicare Other | Admitting: Family Medicine

## 2021-11-09 VITALS — BP 140/75 | HR 71 | Temp 98.0°F | Ht 67.0 in | Wt 185.0 lb

## 2021-11-09 DIAGNOSIS — R609 Edema, unspecified: Secondary | ICD-10-CM | POA: Diagnosis not present

## 2021-11-09 DIAGNOSIS — J309 Allergic rhinitis, unspecified: Secondary | ICD-10-CM | POA: Diagnosis not present

## 2021-11-09 DIAGNOSIS — Z8639 Personal history of other endocrine, nutritional and metabolic disease: Secondary | ICD-10-CM | POA: Diagnosis not present

## 2021-11-09 DIAGNOSIS — E785 Hyperlipidemia, unspecified: Secondary | ICD-10-CM

## 2021-11-09 DIAGNOSIS — M79672 Pain in left foot: Secondary | ICD-10-CM

## 2021-11-09 DIAGNOSIS — L309 Dermatitis, unspecified: Secondary | ICD-10-CM

## 2021-11-09 DIAGNOSIS — M810 Age-related osteoporosis without current pathological fracture: Secondary | ICD-10-CM

## 2021-11-09 DIAGNOSIS — R238 Other skin changes: Secondary | ICD-10-CM | POA: Diagnosis not present

## 2021-11-09 LAB — LIPID PANEL
Cholesterol: 217 mg/dL — ABNORMAL HIGH (ref 0–200)
HDL: 74.4 mg/dL (ref >39.00)
LDL Cholesterol: 133 mg/dL — ABNORMAL HIGH (ref 0–99)
NonHDL: 143.03
Total CHOL/HDL Ratio: 3
Triglycerides: 51 mg/dL (ref 0.0–149.0)
VLDL: 10.2 mg/dL (ref 0.0–40.0)

## 2021-11-09 LAB — T4, FREE: Free T4: 0.75 ng/dL (ref 0.60–1.60)

## 2021-11-09 LAB — T3, FREE: T3, Free: 3.1 pg/mL (ref 2.3–4.2)

## 2021-11-09 LAB — VITAMIN D 25 HYDROXY (VIT D DEFICIENCY, FRACTURES): VITD: 31.29 ng/mL (ref 30.00–100.00)

## 2021-11-09 LAB — TSH: TSH: 2.34 u[IU]/mL (ref 0.35–5.50)

## 2021-11-09 MED ORDER — LORATADINE 10 MG PO TABS: 10.0000 mg | ORAL_TABLET | Freq: Every day | ORAL | 11 refills | Status: DC

## 2021-11-09 MED ORDER — CLOBETASOL PROPIONATE 0.05 % EX CREA: TOPICAL_CREAM | Freq: Two times a day (BID) | CUTANEOUS | 1 refills | Status: DC

## 2021-11-09 MED ORDER — FLONASE SENSIMIST 27.5 MCG/SPRAY NA SUSP: 2.0000 | Freq: Every day | NASAL | 2 refills | Status: AC

## 2021-11-09 NOTE — Assessment & Plan Note (Signed)
Check labs.  She reports prior biopsies were noncancerous. ?

## 2021-11-09 NOTE — Patient Instructions (Addendum)
Nice to see you. ?We will get lab work today and contact her with the results. ?Please try the Flonase and Claritin to see if that helps with the itching. ?Your foot pain may be related to arthritis.  I would suggest icing and using Tylenol if needed for pain.  If its not improving please let us know. ?Please try Lotrimin over-the-counter for the possible athlete's foot. ? ?Please let me know when your dentist notes it is okay to start treatment for osteoporosis.  I have included information about Actonel below. ? ?Risedronate Oral Tablets ?What is this medication? ?RISEDRONATE (ris ED roe nate) slows calcium loss from bones. It treats Paget's disease and osteoporosis. It may be used in other people at risk for bone loss. ?This medicine may be used for other purposes; ask your health care provider or pharmacist if you have questions. ?COMMON BRAND NAME(S): Actonel ?What should I tell my care team before I take this medication? ?They need to know if you have any of these conditions: ?bleeding disorder ?dental disease ?difficulty swallowing ?infection (fever, chills, cough, sore throat, pain or trouble passing urine) ?kidney disease ?low levels of calcium in the blood ?low red blood cell counts ?receiving steroids like dexamethasone or prednisone ?stomach or intestine problems ?trouble sitting or standing for 30 minutes ?an unusual or allergic reaction to risedronate, other drugs, foods, dyes or preservatives ?pregnant or trying to get pregnant ?breast-feeding ?How should I use this medication? ?Take this drug by mouth with a full glass of water. Take it as directed on the prescription label. If you take it once a day, take it at the same time every day. If you take it once a month, take it on the same day of each month. Take the dose right after waking up. Do not eat or drink anything before taking it. Do not take it with any other drink except water. Do not chew or crush the tablet. After taking it, do not eat  breakfast, drink, or take any other drugs or vitamins for at least 30 minutes. Sit or stand up for at least 30 minutes after you take it. Do not lie down. Keep taking it unless your health care provider tells you to stop. ?A special MedGuide will be given to you by the pharmacist with each prescription and refill. Be sure to read this information carefully each time. ?Talk to your health care provider about the use of this drug in children. Special care may be needed. ?Overdosage: If you think you have taken too much of this medicine contact a poison control center or emergency room at once. ?NOTE: This medicine is only for you. Do not share this medicine with others. ?What if I miss a dose? ?If you take your drug once a day, skip it. Take your next dose at the scheduled time the next morning. Do not take two doses on the same day. ?If you take your drug once a month and the next scheduled dose is more than 7 days away, take the missed dose on the morning after you remember. Do not take two doses on the same day. ?If you take your drug once a month and the next scheduled dose is only 1 to 7 days away, skip it. Take the next dose on the morning of the next scheduled dose. Do not take two doses on the same day. ?What may interact with this medication? ?antacids like aluminum hydroxide or magnesium hydroxide ?aspirin ?calcium supplements ?iron supplements ?NSAIDs, medicines for  pain and inflammation, like ibuprofen or naproxen ?thyroid hormones ?vitamins with minerals ?This list may not describe all possible interactions. Give your health care provider a list of all the medicines, herbs, non-prescription drugs, or dietary supplements you use. Also tell them if you smoke, drink alcohol, or use illegal drugs. Some items may interact with your medicine. ?What should I watch for while using this medication? ?Visit your health care provider for regular checks on your progress. It may be some time before you see the benefit  from this drug. ?Some people who take this drug have severe bone, joint, or muscle pain. This drug may also increase your risk for jaw problems or a broken thigh bone. Tell your health care provider right away if you have severe pain in your jaw, bones, joints, or muscles. Tell you health care provider if you have any pain that does not go away or that gets worse. ?You should make sure you get enough calcium and vitamin D while you are taking this drug. Discuss the foods you eat and the vitamins you take with your health care provider. ?You may need blood work done while you are taking this drug. ?Tell your dentist and dental surgeon that you are taking this drug. You should not have major dental surgery while on this drug. See your dentist to have a dental exam and fix any dental problems before starting this drug. Take good care of your teeth while on this drug. Make sure you see your dentist for regular follow-up appointments. ?What side effects may I notice from receiving this medication? ?Side effects that you should report to your doctor or health care provider as soon as possible: ?allergic reactions (skin rash, itching or hives; swelling of the face, lips, or tongue) ?bone pain ?changes in vision ?eye irritation, itching ?general ill feeling or flu-like symptoms ?heartburn (burning feeling in chest, often after eating or when lying down) ?increase in blood pressure ?infection (fever, chills, cough, sore throat, pain or trouble passing urine) ?jaw pain, especially after dental work ?joint pain ?low calcium levels (fast heartbeat; muscle cramps or pain; pain, tingling, or numbness in the hands or feet; seizures) ?muscle pain ?painful or difficulty swallowing ?redness, blistering, peeling, or loosening of the skin, including inside the mouth ?Side effects that usually do not require medical attention (report to your doctor or health care provider if they continue or are bothersome): ?constipation ?depressed  mood ?diarrhea ?dizziness ?headache ?nausea ?stomach pain ?swelling of the ankles, feet, hands ?trouble sleeping ?This list may not describe all possible side effects. Call your doctor for medical advice about side effects. You may report side effects to FDA at 1-800-FDA-1088. ?Where should I keep my medication? ?Keep out of the reach of children and pets. ?Store at room temperature between 20 and 25 degrees C (68 and 77 degrees F). Throw away any unused drug after the expiration date. ?NOTE: This sheet is a summary. It may not cover all possible information. If you have questions about this medicine, talk to your doctor, pharmacist, or health care provider. ?? 2022 Elsevier/Gold Standard (2021-04-20 00:00:00) ? ?Denosumab injection ?What is this medication? ?DENOSUMAB (den oh sue mab) slows bone breakdown. Prolia is used to treat osteoporosis in women after menopause and in men, and in people who are taking corticosteroids for 6 months or more. Delton See is used to treat a high calcium level due to cancer and to prevent bone fractures and other bone problems caused by multiple myeloma or cancer bone metastases.  Delton See is also used to treat giant cell tumor of the bone. ?This medicine may be used for other purposes; ask your health care provider or pharmacist if you have questions. ?COMMON BRAND NAME(S): Prolia, XGEVA ?What should I tell my care team before I take this medication? ?They need to know if you have any of these conditions: ?dental disease ?having surgery or tooth extraction ?infection ?kidney disease ?low levels of calcium or Vitamin D in the blood ?malnutrition ?on hemodialysis ?skin conditions or sensitivity ?thyroid or parathyroid disease ?an unusual reaction to denosumab, other medicines, foods, dyes, or preservatives ?pregnant or trying to get pregnant ?breast-feeding ?How should I use this medication? ?This medicine is for injection under the skin. It is given by a health care professional in a  hospital or clinic setting. ?A special MedGuide will be given to you before each treatment. Be sure to read this information carefully each time. ?For Prolia, talk to your pediatrician regarding the use of this medi

## 2021-11-09 NOTE — Assessment & Plan Note (Addendum)
I suspect her nose symptoms are allergic rhinitis related.  She will start on Flonase and Claritin.  If she is not improving she will let us know. ?

## 2021-11-09 NOTE — Assessment & Plan Note (Signed)
Possibly arthritic versus tendon strain.  Discussed icing 10 minutes 2 times daily and using Tylenol over-the-counter.  If not improving she could see her sports medicine physician. ?

## 2021-11-09 NOTE — Assessment & Plan Note (Signed)
Discussed treatment options with the patient.  This included oral bisphosphonates, Prolia, and zoledronic acid.  Discussed the potential for osteonecrosis of the jaw with Prolia and zoledronic acid.  Discussed that we would not start treatment until her dentist clears [place] to do so.  She was given information on Actonel and Prolia.  We will check a vitamin D today.  She will continue calcium supplementation.  She will continue to increase her physical activity to see if weightbearing exercise will help as well. ?

## 2021-11-09 NOTE — Assessment & Plan Note (Signed)
This is between her great toe and second toe on the left foot though she does report this on the right foot as well.  Possibly related to wearing flip-flops though could be related to athlete's foot.  She will try over-the-counter Lotrimin to see if that is beneficial.  If not improving she will let us know. ?

## 2021-11-09 NOTE — Assessment & Plan Note (Signed)
Check lab work. 

## 2021-11-09 NOTE — Assessment & Plan Note (Signed)
Very minimal today.  Possibly related to her prior dental issues.  We will monitor. ?

## 2021-11-09 NOTE — Assessment & Plan Note (Signed)
Clobetasol refilled.

## 2021-11-09 NOTE — Progress Notes (Signed)
? ? ?[name], MD ?Phone: [phone] ? ?Lindsey Foster is a 76 y.o. female who presents today for follow-up. ? ?Osteoporosis: Patient had a recent bone density scan revealing a T score of -3.4.  She does take some vitamin D and also get 600 mg of calcium daily though she recently increase this to 1200 mg.  She plans to increase her physical activity.  She reports she was on Fosamax in 2002 and had dry mouth and this was stopped.  She notes her dentist is in the process of pulling teeth and feels as though her teeth loss issues are related to the Fosamax.  They do not want her to start on any medication until they are done with her dental work. ? ?Left foot swelling: Patient notes this has been going on for 6 months or so.  Has improved today.  Sore when she flexes the foot to walk.  No injury.  No other precipitating factors.  She wonders if it is related to her chronic left knee issues and her favoring that leg.  She did have an x-ray of the left foot in 2021 that revealed first MTP joint arthritis. ? ?Allergic rhinitis: Patient notes ongoing issues with rhinorrhea.  She notes she gets itchy around her nose as well.  She tried a Benadryl ointment with some benefit for that.  She also tried mometasone drops that are used for her ears around her nose and that was somewhat beneficial.  She does sneeze.  She has mucus in her throat.  No significant congestion.  She is not currently taking any allergy medicines. ? ?Skin irritation between toes: Patient reports between her first and second toes bilaterally her skin seems to be irritated.  She does wear flip-flops though she stopped those a week or so ago.  Notes the irritation seems to be a little better.  She wonders if it is athlete's foot. ? ?Thyroid nodule: She reports a history of radioactive iodine ablation in 2007.  She had subclinical hyperthyroidism at that time.  She is due for follow-up lab work. ? ?Swollen gland: Patient reported a swollen  submandibular gland when she was dealing with her dental issues previously. ? ?Social History  ? ?Tobacco Use  ?Smoking Status Former  ?Smokeless Tobacco Never  ? ? ?Current Outpatient Medications on File Prior to Visit  ?Medication Sig Dispense Refill  ? amLODipine (NORVASC) 5 MG tablet Take 1 tablet (5 mg total) by mouth daily. 90 tablet 3  ? amoxicillin (AMOXIL) 500 MG tablet Take 500 mg by mouth 3 (three) times daily.    ? Calcium Citrate-Vitamin D3 1000-400 LIQD Take 1 tablet by mouth daily. Reported on 02/04/2016    ? famotidine (PEPCID) 20 MG tablet TAKE 1 TABLET(20 MG) BY MOUTH TWICE DAILY BEFORE A MEAL 180 tablet 1  ? Garlic 841 MG TABS Take 1 tablet by mouth daily.     ? ketotifen (ZADITOR) 0.025 % ophthalmic solution 1 drop 2 (two) times daily.    ? mometasone (ELOCON) 0.1 % lotion Apply topically daily.    ? ?No current facility-administered medications on file prior to visit.  ? ? ? ?ROS see history of present illness ? ?Objective ? ?Physical Exam ?Vitals:  ? 11/09/21 1016  ?BP: 140/75  ?Pulse: 71  ?Temp: 98 ?F (36.7 ?C)  ?SpO2: 97%  ? ? ?BP Readings from Last 3 Encounters:  ?11/09/21 140/75  ?10/21/21 130/78  ?08/12/21 (!) 146/81  ? ?Wt Readings from Last 3 Encounters:  ?  11/09/21 185 lb (83.9 kg)  ?10/21/21 185 lb (83.9 kg)  ?08/12/21 183 lb 8 oz (83.2 kg)  ? ? ?Physical Exam ?Constitutional:   ?   General: She is not in acute distress. ?   Appearance: She is not diaphoretic.  ?HENT:  ?   Nose: Nose normal.  ?Neck:  ?   Comments: Left-sided thyroid nodule noted, minimally enlarged submandibular salivary gland on the left, no cervical lymphadenopathy ?Cardiovascular:  ?   Rate and Rhythm: Normal rate and regular rhythm.  ?   Heart sounds: Normal heart sounds.  ?Pulmonary:  ?   Effort: Pulmonary effort is normal.  ?   Breath sounds: Normal breath sounds.  ?Musculoskeletal:  ?   Comments: Dorsum of the left foot is slightly puffy, there is no significant tenderness  ?Skin: ?   General: Skin is warm and  dry.  ?   Comments: Slight skin breakdown between her left first and second toes  ?Neurological:  ?   Mental Status: She is alert.  ? ? ? ?Assessment/Plan: Please see individual problem list. ? ?Problem List Items Addressed This Visit   ? ? Allergic rhinitis  ?  I suspect her nose symptoms are allergic rhinitis related.  She will start on Flonase and Claritin.  If she is not improving she will let us know. ?  ?  ? Relevant Medications  ? fluticasone (FLONASE SENSIMIST) 27.5 MCG/SPRAY nasal spray  ? loratadine (CLARITIN) 10 MG tablet  ? Eczema  ?  Clobetasol refilled. ?  ?  ? Relevant Medications  ? clobetasol cream (TEMOVATE) 0.05 %  ? History of thyroid nodule  ?  Check labs.  She reports prior biopsies were noncancerous. ?  ?  ? Relevant Orders  ? TSH  ? T4, free  ? T3, free  ? Hyperlipidemia  ?  Check lab work. ?  ?  ? Relevant Orders  ? Lipid panel  ? Left foot pain  ?  Possibly arthritic versus tendon strain.  Discussed icing 10 minutes 2 times daily and using Tylenol over-the-counter.  If not improving she could see her sports medicine physician. ?  ?  ? Osteoporosis - Primary  ?  Discussed treatment options with the patient.  This included oral bisphosphonates, Prolia, and zoledronic acid.  Discussed the potential for osteonecrosis of the jaw with Prolia and zoledronic acid.  Discussed that we would not start treatment until her dentist clears [place] to do so.  She was given information on Actonel and Prolia.  We will check a vitamin D today.  She will continue calcium supplementation.  She will continue to increase her physical activity to see if weightbearing exercise will help as well. ?  ?  ? Relevant Orders  ? Vitamin D (25 hydroxy)  ? Skin breakdown  ?  This is between her great toe and second toe on the left foot though she does report this on the right foot as well.  Possibly related to wearing flip-flops though could be related to athlete's foot.  She will try over-the-counter Lotrimin to see if that is  beneficial.  If not improving she will let us know. ?  ?  ? Submandibular gland swelling  ?  Very minimal today.  Possibly related to her prior dental issues.  We will monitor. ?  ?  ? ? ? ? ?Return in about 3 months (around 02/09/2022). ? ?This visit occurred during the SARS-CoV-2 public health emergency.  Safety protocols were in place,  including screening questions prior to the visit, additional usage of staff PPE, and extensive cleaning of exam room while observing appropriate contact time as indicated for disinfecting solutions.  ? ?I have spent 43 minutes in the care of this patient regarding history taking, documentation, completion of exam, review of recent bone density scan, discussion of plan, placing orders, counseling on medication options. ? ? ?[name], MD ?[place] ? ?

## 2021-11-16 DIAGNOSIS — N1832 Chronic kidney disease, stage 3b: Secondary | ICD-10-CM | POA: Diagnosis not present

## 2021-11-25 DIAGNOSIS — I10 Essential (primary) hypertension: Secondary | ICD-10-CM | POA: Diagnosis not present

## 2021-11-25 DIAGNOSIS — N1832 Chronic kidney disease, stage 3b: Secondary | ICD-10-CM | POA: Diagnosis not present

## 2021-11-25 DIAGNOSIS — N2581 Secondary hyperparathyroidism of renal origin: Secondary | ICD-10-CM | POA: Diagnosis not present

## 2021-12-22 DIAGNOSIS — H2513 Age-related nuclear cataract, bilateral: Secondary | ICD-10-CM | POA: Diagnosis not present

## 2022-01-12 NOTE — Progress Notes (Unsigned)
[facility] [place] [address] [place] Phone: [phone] Subjective:   IVilma Meckel, am serving as a scribe for Dr. Hulan Saas.  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  Known arthritic changes.  Did not have any significant improvement with the steroid injection but is only had 1.  Patient still wants to hold on any other injections at this time.  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  Continue conservative therapy, bracing with a lot of activity, topical anti-inflammatories and icing regimen.  Follow-up with me again in 2 to 3 months otherwise.  Update 01/14/2022 Makenzie Weisner is a 76 y.o. female coming in with complaint of L knee and L ankle. Patient states knee has good and bad days, so about the same. Knee cap still slips. Wears her brace less because its uncomfortable. Ankle there are no issues.       Past Medical History:  Diagnosis Date   Allergic rhinitis    Anxiety    panic attacks in hot rooms   Arthritis    Blood in stool    Breast cancer ([place]) 2006   radiation - Rt   Chickenpox    CKD (chronic kidney disease)    Colon polyps    Diverticulosis    GERD (gastroesophageal reflux disease)    Heart murmur    Hyperlipidemia    Hypertension    Hypothyroidism    Multiple thyroid nodules    Osteopenia    Personal history of radiation therapy    Urinary incontinence    UTI (lower urinary tract infection)    Vitamin D deficiency disease    Past Surgical History:  Procedure Laterality Date   BLADDER SURGERY     BREAST EXCISIONAL BIOPSY Right 2006   positive   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   CESAREAN SECTION  1985   COLONOSCOPY N/A 04/30/2021   Procedure: COLONOSCOPY;  Surgeon: [name], MD;  Location: [facility] ENDOSCOPY;  Service: Endoscopy;  Laterality: N/A;   COLONOSCOPY WITH PROPOFOL N/A 09/28/2015   Procedure: COLONOSCOPY WITH PROPOFOL;  Surgeon: Lollie Sails, MD;  Location: [facility] ENDOSCOPY;  Service: Endoscopy;  Laterality: N/A;   DILATION AND CURETTAGE OF UTERUS  2012   Thyroid nodule ablation  2003   Uterine polyp removal  2009    Social History   Socioeconomic History   Marital status: Widowed    Spouse name: Not on file   Number of children: Not on file   Years of education: Not on file   Highest education level: Not on file  Occupational History   Not on file  Tobacco Use   Smoking status: Former   Smokeless tobacco: Never  Vaping Use   Vaping Use: Never used  Substance and Sexual Activity   Alcohol use: No    Alcohol/week: 0.0 standard drinks   Drug use: No   Sexual activity: Not on file  Other Topics Concern   Not on file  Social History Narrative   Not on file   Social Determinants of Health   Financial Resource Strain: Low Risk  Difficulty of Paying Living Expenses: Not hard at all  Food Insecurity: No Food Insecurity   Worried About [place] in the Last Year: Never true   Ran Out of Food in the Last Year: Never true  Transportation Needs: No Transportation Needs   Lack of Transportation (Medical): No   Lack of Transportation (Non-Medical): No  Physical Activity: Insufficiently Active   Days of Exercise per Week: 2 days   Minutes of Exercise per Session: 30 min  Stress: No Stress Concern Present   Feeling of Stress : Not at all  Social Connections: Unknown   Frequency of Communication with Friends and Family: More than three times a week   Frequency of Social Gatherings with Friends and Family: Not on file   Attends Religious Services: Not on Electrical engineer or Organizations: Not on file    Attends Archivist Meetings: Not on file   Marital Status: Not on file   Allergies  Allergen Reactions   Latex Rash   Family History  Problem Relation Age of Onset   Heart disease Other        Parent   Kidney disease Other        Parent   Uterine cancer Maternal Aunt    Kidney disease Mother    Angina Father    Heart attack Father    Multiple myeloma Brother    Breast cancer Neg Hx      Current Outpatient Medications (Cardiovascular):    amLODipine (NORVASC) 5 MG tablet, Take 1 tablet (5 mg total) by mouth daily.  Current Outpatient Medications (Respiratory):    fluticasone (FLONASE SENSIMIST) 27.5 MCG/SPRAY nasal spray, Place 2 sprays into the nose daily.   loratadine (CLARITIN) 10 MG tablet, Take 1 tablet (10 mg total) by mouth daily.    Current Outpatient Medications (Other):    amoxicillin (AMOXIL) 500 MG tablet, Take 500 mg by mouth 3 (three) times daily.   Calcium Citrate-Vitamin D3 1000-400 LIQD, Take 1 tablet by mouth daily. Reported on 02/04/2016   clobetasol cream (TEMOVATE) 0.05 %, Apply topically 2 (two) times daily. For 2 weeks, then just on week days as needed   famotidine (PEPCID) 20 MG tablet, TAKE 1 TABLET(20 MG) BY MOUTH TWICE DAILY BEFORE A MEAL   Garlic 340 MG TABS, Take 1 tablet by mouth daily.    ketotifen (ZADITOR) 0.025 % ophthalmic solution, 1 drop 2 (two) times daily.   mometasone (ELOCON) 0.1 % lotion, Apply topically daily.    Objective  Blood pressure (!) 142/76, pulse 74, height _0  (1.702 m), weight 184 lb (83.5 kg), SpO2 96 %.   General: No apparent distress alert and oriented x3 mood and affect normal, dressed appropriately.  HEENT: Pupils equal, extraocular movements intact  Respiratory: Patient's speak in full sentences and does not appear short of breath  Cardiovascular: trace  lower extremity edema, non tender, no erythema  Gait normal with good balance and coordination.  MSK: Antalgic gait varus deformity of the left  knee.  Instability noted with valgus and varus force.  Lateral tracking of the patella noted.  Ankle arthritic changes with overpronation noted as well bilaterally.    Impression and Recommendations:     The above documentation has been reviewed and is accurate and complete Lyndal Pulley, DO

## 2022-01-13 ENCOUNTER — Ambulatory Visit (INDEPENDENT_AMBULATORY_CARE_PROVIDER_SITE_OTHER): Payer: Medicare Other | Admitting: Family Medicine

## 2022-01-13 DIAGNOSIS — M1712 Unilateral primary osteoarthritis, left knee: Secondary | ICD-10-CM

## 2022-01-13 NOTE — Assessment & Plan Note (Signed)
Patient is doing relatively well still with only 1 injection 7 months ago.  We discussed potentially repeating injection which patient is at this moment because she is feeling very declining.  Due to patient having more pain now in the patella area.  Patient given Tru pull lite brace to see if this will be helpful and follow-up with me again in 3 months can call if any worsening symptoms

## 2022-01-13 NOTE — Patient Instructions (Signed)
Doing great See you again in 3 months

## 2022-01-14 ENCOUNTER — Telehealth: Payer: Self-pay | Admitting: Family Medicine

## 2022-01-14 MED ORDER — FLUCONAZOLE 150 MG PO TABS: 150.0000 mg | ORAL_TABLET | ORAL | 0 refills | Status: AC

## 2022-01-14 NOTE — Telephone Encounter (Signed)
I called and spoke with the patient and Informed her that the provider sent in [place] for her to take, she stated her symptoms are a lot of itching and a burning sensation.  I also informed her that if it does not improve give [place] a call she is scheduled to see provider on 6/27.  Tarea Skillman,cma

## 2022-01-14 NOTE — Telephone Encounter (Signed)
Pt called stating she is taking antibiotics and have a yeast infection and she was prescribed fluconazole two years ago and want to get the same medication before

## 2022-01-14 NOTE — Telephone Encounter (Signed)
Diflucan sent to the pharmacy.  If this does not resolve her symptoms she needs to contact us for a visit.  Please confirm what symptoms she is currently having.

## 2022-01-20 ENCOUNTER — Ambulatory Visit: Payer: BLUE CROSS/BLUE SHIELD | Admitting: Dermatology

## 2022-01-27 DIAGNOSIS — E7849 Other hyperlipidemia: Secondary | ICD-10-CM | POA: Diagnosis not present

## 2022-01-27 DIAGNOSIS — I10 Essential (primary) hypertension: Secondary | ICD-10-CM | POA: Diagnosis not present

## 2022-01-27 DIAGNOSIS — R0789 Other chest pain: Secondary | ICD-10-CM | POA: Diagnosis not present

## 2022-01-27 DIAGNOSIS — I340 Nonrheumatic mitral (valve) insufficiency: Secondary | ICD-10-CM | POA: Diagnosis not present

## 2022-02-08 ENCOUNTER — Encounter: Payer: Self-pay | Admitting: Family Medicine

## 2022-02-08 ENCOUNTER — Ambulatory Visit (INDEPENDENT_AMBULATORY_CARE_PROVIDER_SITE_OTHER): Payer: Medicare Other | Admitting: Family Medicine

## 2022-02-08 VITALS — BP 120/80 | HR 60 | Temp 98.6°F | Ht 67.0 in | Wt 181.8 lb

## 2022-02-08 DIAGNOSIS — E785 Hyperlipidemia, unspecified: Secondary | ICD-10-CM | POA: Diagnosis not present

## 2022-02-08 DIAGNOSIS — L299 Pruritus, unspecified: Secondary | ICD-10-CM

## 2022-02-08 DIAGNOSIS — I10 Essential (primary) hypertension: Secondary | ICD-10-CM | POA: Diagnosis not present

## 2022-02-08 DIAGNOSIS — F419 Anxiety disorder, unspecified: Secondary | ICD-10-CM

## 2022-02-08 DIAGNOSIS — M26609 Unspecified temporomandibular joint disorder, unspecified side: Secondary | ICD-10-CM | POA: Diagnosis not present

## 2022-02-08 DIAGNOSIS — K219 Gastro-esophageal reflux disease without esophagitis: Secondary | ICD-10-CM

## 2022-02-08 DIAGNOSIS — R439 Unspecified disturbances of smell and taste: Secondary | ICD-10-CM | POA: Insufficient documentation

## 2022-02-08 LAB — HEPATIC FUNCTION PANEL
ALT: 15 U/L (ref 0–35)
AST: 17 U/L (ref 0–37)
Albumin: 4.3 g/dL (ref 3.5–5.2)
Alkaline Phosphatase: 61 U/L (ref 39–117)
Bilirubin, Direct: 0.1 mg/dL (ref 0.0–0.3)
Total Bilirubin: 0.5 mg/dL (ref 0.2–1.2)
Total Protein: 7.1 g/dL (ref 6.0–8.3)

## 2022-02-08 LAB — LIPID PANEL
Cholesterol: 205 mg/dL — ABNORMAL HIGH (ref 0–200)
HDL: 74.2 mg/dL (ref >39.00)
LDL Cholesterol: 118 mg/dL — ABNORMAL HIGH (ref 0–99)
NonHDL: 131.26
Total CHOL/HDL Ratio: 3
Triglycerides: 67 mg/dL (ref 0.0–149.0)
VLDL: 13.4 mg/dL (ref 0.0–40.0)

## 2022-02-08 MED ORDER — CLONAZEPAM 0.5 MG PO TABS: 0.2500 mg | ORAL_TABLET | Freq: Once | ORAL | 0 refills | Status: DC | PRN

## 2022-02-08 NOTE — Assessment & Plan Note (Signed)
Adequately controlled.  She will continue amlodipine 2.5 mg daily.

## 2022-02-08 NOTE — Assessment & Plan Note (Signed)
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  Discussed use of Tylenol for discomfort.  She will discuss with her dentist as well.

## 2022-02-08 NOTE — Assessment & Plan Note (Signed)
This may be related to an allergy though we will check a hepatic function panel to determine if there are any abnormalities there.  If there are no abnormalities on that we will treat for allergies.

## 2022-02-16 ENCOUNTER — Telehealth: Payer: Self-pay | Admitting: Family Medicine

## 2022-02-16 NOTE — Telephone Encounter (Signed)
Copied from [place] [id number]. Topic: Medicare AWV >> Feb 16, 2022 10:17 AM Devoria Glassing wrote: Reason for CRM: Left message for patient to schedule Annual Wellness Visit.  Please schedule with Nurse Health Advisor Denisa O'Brien-Blaney, LPN at [facility].  Please call [phone] ask for [facility]

## 2022-02-17 DIAGNOSIS — I340 Nonrheumatic mitral (valve) insufficiency: Secondary | ICD-10-CM | POA: Diagnosis not present

## 2022-03-07 ENCOUNTER — Ambulatory Visit: Payer: BLUE CROSS/BLUE SHIELD | Admitting: Dermatology

## 2022-03-11 ENCOUNTER — Ambulatory Visit (INDEPENDENT_AMBULATORY_CARE_PROVIDER_SITE_OTHER): Payer: Medicare Other

## 2022-03-11 VITALS — Ht 67.0 in | Wt 181.0 lb

## 2022-03-11 DIAGNOSIS — Z0000 Encounter for general adult medical examination without abnormal findings: Secondary | ICD-10-CM | POA: Diagnosis not present

## 2022-03-11 NOTE — Patient Instructions (Addendum)
  Lindsey Foster , Thank you for taking time to come for your Medicare Wellness Visit. I appreciate your ongoing commitment to your health goals. Please review the following plan we discussed and let me know if I can assist you in the future.   These are the goals we discussed:  Goals      Maintain Healthy Lifestyle     Stay active, walk in the park for exercise Healthy diet        This is a list of the screening recommended for you and due dates:  Health Maintenance  Topic Date Due   Pneumonia Vaccine (1 - PCV) 04/15/2022*   Zoster (Shingles) Vaccine (1 of 2) 06/11/2022*   Tetanus Vaccine  03/12/2023*   Flu Shot  03/15/2022   Colon Cancer Screening  04/30/2026   DEXA scan (bone density measurement)  Completed   COVID-19 Vaccine  Completed   HPV Vaccine  Aged Out  *Topic was postponed. The date shown is not the original due date.

## 2022-03-11 NOTE — Progress Notes (Signed)
Subjective:   Lindsey Foster is a 76 y.o. female who presents for Medicare Annual (Subsequent) preventive examination.  Review of Systems    No ROS.  Medicare Wellness Virtual Visit.  Visual/audio telehealth visit, UTA vital signs.   See social history for additional risk factors.   Cardiac Risk Factors include: advanced age (>2men, >85 women);hypertension     Objective:    Today's Vitals   03/11/22 1231  Weight: 181 lb (82.1 kg)  Height: $Remove'5\' 7"'PeflTtc$  (1.702 m)   Body mass index is 28.35 kg/m.     04/30/2021    8:34 AM 02/16/2021   10:00 AM 08/13/2020   11:14 AM 02/14/2020    9:42 AM 08/01/2019   10:50 AM 02/13/2019    9:46 AM 07/31/2018   12:06 PM  Advanced Directives  Does Patient Have a Medical Advance Directive? No Yes No Yes Yes Yes Yes  Type of Social research officer, government;Living will  [place];Living will Living will;Healthcare Power of Attorney Living will;Healthcare Power of Attorney   Does patient want to make changes to medical advance directive?  No - Patient declined  No - Patient declined No - Patient declined No - Patient declined   Copy of [place] in Chart?  No - copy requested  No - copy requested No - copy requested No - copy requested   Would patient like information on creating a medical advance directive? No - Patient declined  No - Patient declined        Current Medications (verified) Outpatient Encounter Medications as of 03/11/2022  Medication Sig   amLODipine (NORVASC) 2.5 MG tablet Take by mouth.   amoxicillin (AMOXIL) 500 MG tablet Take 500 mg by mouth 3 (three) times daily.   Calcium Citrate-Vitamin D3 1000-400 LIQD Take 1 tablet by mouth daily. Reported on 02/04/2016   clobetasol cream (TEMOVATE) 0.05 % Apply topically 2 (two) times daily. For 2 weeks, then just on week days as needed   clonazePAM (KLONOPIN) 0.5 MG tablet Take 0.5 tablets (0.25 mg total) by mouth once as needed for up to  1 dose for anxiety. Take prior to getting on the plane during travel   famotidine (PEPCID) 20 MG tablet TAKE 1 TABLET(20 MG) BY MOUTH TWICE DAILY BEFORE A MEAL   fluticasone (FLONASE SENSIMIST) 27.5 MCG/SPRAY nasal spray Place 2 sprays into the nose daily.   Garlic 532 MG TABS Take 1 tablet by mouth daily.    ketotifen (ZADITOR) 0.025 % ophthalmic solution 1 drop 2 (two) times daily.   loratadine (CLARITIN) 10 MG tablet Take 1 tablet (10 mg total) by mouth daily.   mometasone (ELOCON) 0.1 % lotion Apply topically daily.   [DISCONTINUED] HYDROcodone-acetaminophen (NORCO/VICODIN) 5-325 MG tablet    No facility-administered encounter medications on file as of 03/11/2022.    Allergies (verified) Latex   History: Past Medical History:  Diagnosis Date   Allergic rhinitis    Anxiety    panic attacks in hot rooms   Arthritis    Blood in stool    Breast cancer ([place]) 2006   radiation - Rt   Chickenpox    CKD (chronic kidney disease)    Colon polyps    Diverticulosis    GERD (gastroesophageal reflux disease)    Heart murmur    Hyperlipidemia    Hypertension    Hypothyroidism    Multiple thyroid nodules    Osteopenia    Personal history of radiation therapy  Urinary incontinence    UTI (lower urinary tract infection)    Vitamin D deficiency disease    Past Surgical History:  Procedure Laterality Date   BLADDER SURGERY     BREAST EXCISIONAL BIOPSY Right 2006   positive   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   CESAREAN SECTION  1985   COLONOSCOPY N/A 04/30/2021   Procedure: COLONOSCOPY;  Surgeon: [name], MD;  Location: [facility] ENDOSCOPY;  Service: Endoscopy;  Laterality: N/A;   COLONOSCOPY WITH PROPOFOL N/A 09/28/2015   Procedure: COLONOSCOPY WITH PROPOFOL;  Surgeon: Lollie Sails, MD;  Location: [facility] ENDOSCOPY;  Service: Endoscopy;  Laterality: N/A;   DILATION AND CURETTAGE OF UTERUS  2012   Thyroid nodule ablation  2003   Uterine polyp removal  2009    Family  History  Problem Relation Age of Onset   Angina Father    Heart attack Father    Multiple myeloma Brother    Uterine cancer Maternal Aunt    Heart disease Other        Parent   Breast cancer Neg Hx    Social History   Socioeconomic History   Marital status: Widowed    Spouse name: Not on file   Number of children: Not on file   Years of education: Not on file   Highest education level: Not on file  Occupational History   Not on file  Tobacco Use   Smoking status: Former   Smokeless tobacco: Never  Vaping Use   Vaping Use: Never used  Substance and Sexual Activity   Alcohol use: No    Alcohol/week: 0.0 standard drinks of alcohol   Drug use: No   Sexual activity: Not on file  Other Topics Concern   Not on file  Social History Narrative   Not on file   Social Determinants of Health   Financial Resource Strain: Low Risk  (03/11/2022)   Overall Financial Resource Strain (CARDIA)    Difficulty of Paying Living Expenses: Not hard at all  Food Insecurity: No Food Insecurity (03/11/2022)   Hunger Vital Sign    Worried About Running Out of Food in the Last Year: Never true    [place] in the Last Year: Never true  Transportation Needs: No Transportation Needs (03/11/2022)   PRAPARE - Hydrologist (Medical): No    Lack of Transportation (Non-Medical): No  Physical Activity: Insufficiently Active (03/11/2022)   Exercise Vital Sign    Days of Exercise per Week: 2 days    Minutes of Exercise per Session: 30 min  Stress: No Stress Concern Present (03/11/2022)   [place]    Feeling of Stress : Not at all  Social Connections: Unknown (03/11/2022)   Social Connection and Isolation Panel [NHANES]    Frequency of Communication with Friends and Family: More than three times a week    Frequency of Social Gatherings with Friends and Family: Not on file    Attends Religious Services:  Not on file    Active Member of Clubs or Organizations: Not on file    Attends Archivist Meetings: Not on file    Marital Status: Not on file    Tobacco Counseling Counseling given: Not Answered   Clinical Intake:  Pre-visit preparation completed: Yes        Diabetes: No  How often do you need to have someone help you when you  read instructions, pamphlets, or other written materials from your doctor or pharmacy?: 1 - Never   Interpreter Needed?: No      Activities of Daily Living    03/11/2022   12:45 PM  In your present state of health, do you have any difficulty performing the following activities:  Hearing? 0  Vision? 0  Difficulty concentrating or making decisions? 0  Walking or climbing stairs? 0  Dressing or bathing? 0  Doing errands, shopping? 0  Preparing Food and eating ? N  Using the Toilet? N  In the past six months, have you accidently leaked urine? N  Do you have problems with loss of bowel control? N  Managing your Medications? N  Managing your Finances? N  Housekeeping or managing your Housekeeping? N    Patient Care Team: [name], MD as PCP - General (Family Medicine)  Indicate any recent Medical Services you may have received from other than [facility] providers in the past year (date may be approximate).     Assessment:   This is a routine wellness examination for [place].  Virtual Visit via Telephone Note  I connected with  Amari Burnsworth on 03/11/22 at 12:30 PM EDT by telephone and verified that I am speaking with the correct person using two identifiers.  Location: Patient: home  Provider: office Persons participating in the virtual visit: patient/Nurse Health Advisor   I discussed the limitations of performing an evaluation and management service by telehealth. We continued and completed visit with audio only. Some vital signs may be absent or patient reported.   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 Audiology testing several years ago. She does not wear a hearing aid per preference. Followed by Dr. 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  Dietary issues and exercise activities discussed: Current Exercise Habits: Structured exercise class, Time (Minutes): 60, Frequency (Times/Week): 2, Weekly Exercise (Minutes/Week): 120, Intensity: Mild Regular diet Good water intake   Goals Addressed             This Visit's Progress    Maintain Healthy Lifestyle       Stay active, walk in the park for exercise Healthy diet       Depression Screen    03/11/2022   12:45 PM 02/08/2022   10:31 AM 11/09/2021   10:20 AM 08/03/2021    9:47 AM 05/10/2021   10:51 AM 02/16/2021    9:58 AM 11/06/2020   10:55 AM  PHQ 2/9 Scores  PHQ - 2 Score 0 0 0 0 0 0 0    Fall Risk    11/09/2021   10:20 AM 08/03/2021    9:47 AM 05/10/2021   10:51 AM 02/16/2021   10:00 AM 11/06/2020   10:55 AM  Fall Risk   Falls in the past year? 0 0 0 0 0  Number falls in past yr: 0 0 0 0 0  Injury with Fall? 0 0  0   Risk for fall due to : No Fall Risks No Fall Risks     Follow up Falls evaluation completed Falls evaluation completed Falls evaluation completed Falls evaluation completed Falls evaluation completed    FALL RISK PREVENTION PERTAINING TO THE HOME: Home free of loose throw rugs in walkways, pet beds, electrical cords, etc? Yes  Adequate lighting in your home to reduce risk of falls? Yes  ASSISTIVE DEVICES UTILIZED TO PREVENT FALLS: Life alert? No  Use of a cane, walker or w/c? Yes , as needed  TIMED UP AND GO: Was the test performed? No .   Cognitive Function: Patient is alert and oriented x3.      02/08/2018    9:35 AM 02/07/2017    9:58 AM  MMSE - Mini Mental State Exam  Orientation to time 5 5  Orientation to Place  5 5  Registration 3 3  Attention/ Calculation 5 5  Recall 3 3  Language- name 2 objects 2 2  Language- repeat 1 1  Language- follow 3 step command 3 3  Language- read & follow direction 1 1  Write a sentence 1 1  Copy design 1 1  Total score 30 30        02/14/2020    9:53 AM 02/13/2019    9:48 AM  6CIT Screen  What Year? 0 points 0 points  What month? 0 points 0 points  What time?  0 points  Count back from 20  0 points  Months in reverse 0 points 0 points  Repeat phrase 0 points     Immunizations Immunization History  Administered Date(s) Administered   Fluad Quad(high Dose 65+) 07/17/2019, 07/27/2020   PFIZER Comirnaty(Gray Top)Covid-19 Tri-Sucrose Vaccine 11/09/2020   PFIZER(Purple Top)SARS-COV-2 Vaccination 09/26/2019, 10/17/2019   Pfizer Covid-19 Vaccine Bivalent Booster 5yrs & up 06/15/2021   TDAP status: Due, Education has been provided regarding the importance of this vaccine. Advised may receive this vaccine at local pharmacy or Health Dept. Aware to provide a copy of the vaccination record if obtained from local pharmacy or Health Dept. Verbalized acceptance and understanding.  Pneumococcal vaccine status: Due, Education has been provided regarding the importance of this vaccine. Advised may receive this vaccine at local pharmacy or Health Dept. Aware to provide a copy of the vaccination record if obtained from local pharmacy or Health Dept. Verbalized acceptance and understanding.  Shingrix Completed?: No.    Education has been provided regarding the importance of this vaccine. Patient has been advised to call insurance company to determine out of pocket expense if they have not yet received this vaccine. Advised may also receive vaccine at local pharmacy or Health Dept. Verbalized acceptance and understanding.  Screening Tests Health Maintenance  Topic Date Due   Pneumonia Vaccine 37+ Years old (1 - PCV) 04/15/2022 (Originally 11/16/2008)   Zoster Vaccines-  Shingrix (1 of 2) 06/11/2022 (Originally 11/17/1964)   TETANUS/TDAP  03/12/2023 (Originally 11/17/1964)   INFLUENZA VACCINE  03/15/2022   COLONOSCOPY (Pts 45-11yrs Insurance coverage will need to be confirmed)  04/30/2026   DEXA SCAN  Completed   COVID-19 Vaccine  Completed   HPV VACCINES  Aged Out   Health Maintenance There are no preventive care reminders to display for this patient.  Lung Cancer Screening: (Low Dose CT Chest recommended if Age 57-80 years, 30 pack-year currently smoking OR have quit w/in 15years.) does not qualify.   Vision Screening: Recommended annual ophthalmology exams for early detection of glaucoma and other disorders of the eye.  Dental Screening: Recommended annual dental exams for proper oral hygiene  Community Resource Referral / Chronic Care Management: CRR required this visit?  No   CCM required this visit?  No      Plan:   Keep all routine maintenance appointments.   I have personally reviewed and noted the following in the patient's chart:   Medical and social history Use of alcohol,  tobacco or illicit drugs  Current medications and supplements including opioid prescriptions.  Functional ability and status Nutritional status Physical activity Advanced directives List of other physicians Hospitalizations, surgeries, and ER visits in previous 12 months Vitals Screenings to include cognitive, depression, and falls Referrals and appointments  In addition, I have reviewed and discussed with patient certain preventive protocols, quality metrics, and best practice recommendations. A written personalized care plan for preventive services as well as general preventive health recommendations were provided to patient.     Varney Biles, LPN   10/20/5692

## 2022-03-28 ENCOUNTER — Telehealth (INDEPENDENT_AMBULATORY_CARE_PROVIDER_SITE_OTHER): Payer: Medicare Other | Admitting: Family Medicine

## 2022-03-28 ENCOUNTER — Encounter: Payer: Self-pay | Admitting: Family Medicine

## 2022-03-28 DIAGNOSIS — U071 COVID-19: Secondary | ICD-10-CM | POA: Diagnosis not present

## 2022-03-28 MED ORDER — MOLNUPIRAVIR EUA 200MG CAPSULE: 4.0000 | ORAL_CAPSULE | Freq: Two times a day (BID) | ORAL | 0 refills | Status: AC

## 2022-03-28 NOTE — Progress Notes (Signed)
Virtual Visit via telephone Note  This visit type was conducted due to national recommendations for restrictions regarding the COVID-19 pandemic (e.g. social distancing).  This format is felt to be most appropriate for this patient at this time.  All issues noted in this document were discussed and addressed.  No physical exam was performed (except for noted visual exam findings with Video Visits).   I connected with Lindsey Foster today at  1:30 PM EDT by telephone and verified that I am speaking with the correct person using two identifiers. Location patient: home Location provider: work Persons participating in the virtual visit: patient, provider  I discussed the limitations, risks, security and privacy concerns of performing an evaluation and management service by telephone and the availability of in person appointments. I also discussed with the patient that there may be a patient responsible charge related to this service. The patient expressed understanding and agreed to proceed.  Interactive audio and video telecommunications were attempted between this provider and patient, however failed, due to patient having technical difficulties OR patient did not have access to video capability.  We continued and completed visit with audio only.   Reason for visit: same day visit.  HPI: [id number]: Patient notes she just got back from a cruise.  She had onset of scratchy throat on Saturday and then the sore throat got worse on Sunday.  She tested positive for COVID on Sunday.  She had fever of 103.7 F though this has come down to 100.3 F with Tylenol.  She notes mild cough.  No shortness of breath.  She does have postnasal drip.  No chest pain.  She had some diarrhea that has improved.  She been taking Robitussin and Tylenol.   ROS: See pertinent positives and negatives per HPI.  Past Medical History:  Diagnosis Date   Allergic rhinitis    Anxiety    panic attacks in hot rooms    Arthritis    Blood in stool    Breast cancer ([place]) 2006   radiation - Rt   Chickenpox    CKD (chronic kidney disease)    Colon polyps    Diverticulosis    GERD (gastroesophageal reflux disease)    Heart murmur    Hyperlipidemia    Hypertension    Hypothyroidism    Multiple thyroid nodules    Osteopenia    Personal history of radiation therapy    Urinary incontinence    UTI (lower urinary tract infection)    Vitamin D deficiency disease     Past Surgical History:  Procedure Laterality Date   BLADDER SURGERY     BREAST EXCISIONAL BIOPSY Right 2006   positive   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   CESAREAN SECTION  1985   COLONOSCOPY N/A 04/30/2021   Procedure: COLONOSCOPY;  Surgeon: [name], MD;  Location: [facility] ENDOSCOPY;  Service: Endoscopy;  Laterality: N/A;   COLONOSCOPY WITH PROPOFOL N/A 09/28/2015   Procedure: COLONOSCOPY WITH PROPOFOL;  Surgeon: Lollie Sails, MD;  Location: [facility] ENDOSCOPY;  Service: Endoscopy;  Laterality: N/A;   DILATION AND CURETTAGE OF UTERUS  2012   Thyroid nodule ablation  2003   Uterine polyp removal  2009     Family History  Problem Relation Age of Onset   Angina Father    Heart attack Father    Multiple myeloma Brother    Uterine cancer Maternal Aunt    Heart disease Other  Parent   Breast cancer Neg Hx     SOCIAL HX: Former smoker   Current Outpatient Medications:    amLODipine (NORVASC) 2.5 MG tablet, Take 5 mg by mouth daily., Disp: , Rfl:    Calcium Citrate-Vitamin D3 1000-400 LIQD, Take 1 tablet by mouth daily. Reported on 02/04/2016, Disp: , Rfl:    clobetasol cream (TEMOVATE) 0.05 %, Apply topically 2 (two) times daily. For 2 weeks, then just on week days as needed, Disp: 60 g, Rfl: 1   clonazePAM (KLONOPIN) 0.5 MG tablet, Take 0.5 tablets (0.25 mg total) by mouth once as needed for up to 1 dose for anxiety. Take prior to getting on the plane during travel, Disp: 5 tablet, Rfl: 0   famotidine (PEPCID) 20 MG  tablet, TAKE 1 TABLET(20 MG) BY MOUTH TWICE DAILY BEFORE A MEAL, Disp: 180 tablet, Rfl: 1   fluticasone (FLONASE SENSIMIST) 27.5 MCG/SPRAY nasal spray, Place 2 sprays into the nose daily., Disp: 10 g, Rfl: 2   Garlic 100 MG TABS, Take 1 tablet by mouth daily. , Disp: , Rfl:    ketotifen (ZADITOR) 0.025 % ophthalmic solution, 1 drop 2 (two) times daily., Disp: , Rfl:    loratadine (CLARITIN) 10 MG tablet, Take 1 tablet (10 mg total) by mouth daily., Disp: 30 tablet, Rfl: 11   molnupiravir EUA (LAGEVRIO) 200 mg CAPS capsule, Take 4 capsules (800 mg total) by mouth 2 (two) times daily for 5 days., Disp: 40 capsule, Rfl: 0   mometasone (ELOCON) 0.1 % lotion, Apply topically daily., Disp: , Rfl:   EXAM: This was a telephone visit and thus no exam was completed.  ASSESSMENT AND PLAN:  Discussed the following assessment and plan:  Problem List Items Addressed This Visit     COVID-19    Patient has COVID-19.  She is high risk given her age and medical history.  Discussed treatment with oral COVID medications.  I opted to treat with molnupiravir given the risk of hypertension with Paxlovid and potential medication interactions with Paxlovid.  Discussed risk of diarrhea, nausea, and dizziness with molnupiravir.  Discussed that the purpose of this medication is to reduce her risk of dying and ending up in the hospital.  Discussed she may start to feel better sooner while taking this.  Discussed quarantine with her.  I encouraged her to stay quarantined at least until she has finished the molnupiravir and if her symptoms have resolved at that time she can come off of quarantine.  If she continues to have symptoms once she finishes the molnupiravir she will remain on quarantine through 04/05/2022.  Advised to seek medical attention for chest pain, shortness of breath, cough productive of blood, or elevated fevers that do not come down with Tylenol or ibuprofen.  Advised that anybody that lives with her should  test for COVID if they develop symptoms.      Relevant Medications   molnupiravir EUA (LAGEVRIO) 200 mg CAPS capsule    Return if symptoms worsen or fail to improve.   I discussed the assessment and treatment plan with the patient. The patient was provided an opportunity to ask questions and all were answered. The patient agreed with the plan and demonstrated an understanding of the instructions.   The patient was advised to call back or seek an in-person evaluation if the symptoms worsen or if the condition fails to improve as anticipated.  I provided 12 minutes of non-face-to-face time during this encounter.   Marikay Alar, MD

## 2022-03-28 NOTE — Assessment & Plan Note (Signed)
Patient has COVID-19.  She is high risk given her age and medical history.  Discussed treatment with oral COVID medications.  I opted to treat with molnupiravir given the risk of hypertension with Paxlovid and potential medication interactions with Paxlovid.  Discussed risk of diarrhea, nausea, and dizziness with molnupiravir.  Discussed that the purpose of this medication is to reduce her risk of dying and ending up in the hospital.  Discussed she may start to feel better sooner while taking this.  Discussed quarantine with her.  I encouraged her to stay quarantined at least until she has finished the molnupiravir and if her symptoms have resolved at that time she can come off of quarantine.  If she continues to have symptoms once she finishes the molnupiravir she will remain on quarantine through 04/05/2022.  Advised to seek medical attention for chest pain, shortness of breath, cough productive of blood, or elevated fevers that do not come down with Tylenol or ibuprofen.  Advised that anybody that lives with her should test for COVID if they develop symptoms.

## 2022-04-20 NOTE — Progress Notes (Signed)
Lindsey Foster [address] [place] Phone: [phone] Subjective:   IVilma Meckel, am serving as a scribe for Dr. Hulan Saas.  I'm seeing this patient by the request  of:  [name], MD  CC:   [id number]  01/13/2022 Patient is doing relatively well still with only 1 injection 7 months ago.  We discussed potentially repeating injection which patient is at this moment because she is feeling very declining.  Due to patient having more pain now in the patella area.  Patient given Tru pull lite brace to see if this will be helpful and follow-up with me again in 3 months can call if any worsening symptoms  Update 04/21/2022 Lindsey Foster is a 76 y.o. female coming in with complaint of L knee pain. Patient states that she has intermittent pain in her knee. Discontinued use of brace as she is feeling better. Uses Tylenol on days when her pain is worse. Feels like she has to move slower and this helps with pain.       Past Medical History:  Diagnosis Date   Allergic rhinitis    Anxiety    panic attacks in hot rooms   Arthritis    Blood in stool    Breast cancer ([place]) 2006   radiation - Rt   Chickenpox    CKD (chronic kidney disease)    Colon polyps    Diverticulosis    GERD (gastroesophageal reflux disease)    Heart murmur    Hyperlipidemia    Hypertension    Hypothyroidism    Multiple thyroid nodules    Osteopenia    Personal history of radiation therapy    Urinary incontinence    UTI (lower urinary tract infection)    Vitamin D deficiency disease    Past Surgical History:  Procedure Laterality Date   BLADDER SURGERY     BREAST EXCISIONAL BIOPSY Right 2006   positive   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   CESAREAN SECTION  1985   COLONOSCOPY N/A 04/30/2021   Procedure: COLONOSCOPY;  Surgeon: [name], MD;  Location: [facility] ENDOSCOPY;  Service: Endoscopy;  Laterality: N/A;   COLONOSCOPY WITH  PROPOFOL N/A 09/28/2015   Procedure: COLONOSCOPY WITH PROPOFOL;  Surgeon: Lollie Sails, MD;  Location: [facility] ENDOSCOPY;  Service: Endoscopy;  Laterality: N/A;   DILATION AND CURETTAGE OF UTERUS  2012   Thyroid nodule ablation  2003   Uterine polyp removal  2009    Social History   Socioeconomic History   Marital status: Widowed    Spouse name: Not on file   Number of children: Not on file   Years of education: Not on file   Highest education level: Not on file  Occupational History   Not on file  Tobacco Use   Smoking status: Former   Smokeless tobacco: Never  Vaping Use   Vaping Use: Never used  Substance and Sexual Activity   Alcohol use: No    Alcohol/week: 0.0 standard drinks of alcohol   Drug use: No   Sexual activity: Not on file  Other Topics Concern   Not on file  Social History Narrative   Not on file   Social Determinants of Health   Financial Resource Strain: Low Risk  (03/11/2022)   Overall Financial Resource Strain (CARDIA)    Difficulty of Paying Living Expenses: Not hard at all  Food Insecurity: No Food Insecurity (03/11/2022)   Hunger  Vital Sign    Worried About Charity fundraiser in the Last Year: Never true    Ran Out of Food in the Last Year: Never true  Transportation Needs: No Transportation Needs (03/11/2022)   PRAPARE - Hydrologist (Medical): No    Lack of Transportation (Non-Medical): No  Physical Activity: Insufficiently Active (03/11/2022)   Exercise Vital Sign    Days of Exercise per Week: 2 days    Minutes of Exercise per Session: 30 min  Stress: No Stress Concern Present (03/11/2022)   [place]    Feeling of Stress : Not at all  Social Connections: Unknown (03/11/2022)   Social Connection and Isolation Panel [NHANES]    Frequency of Communication with Friends and Family: More than three times a week    Frequency of Social Gatherings with  Friends and Family: Not on file    Attends Religious Services: Not on Advertising copywriter or Organizations: Not on file    Attends Archivist Meetings: Not on file    Marital Status: Not on file   Allergies  Allergen Reactions   Latex Rash   Family History  Problem Relation Age of Onset   Angina Father    Heart attack Father    Multiple myeloma Brother    Uterine cancer Maternal Aunt    Heart disease Other        Parent   Breast cancer Neg Hx      Current Outpatient Medications (Cardiovascular):    amLODipine (NORVASC) 2.5 MG tablet, Take 5 mg by mouth daily.  Current Outpatient Medications (Respiratory):    fluticasone (FLONASE SENSIMIST) 27.5 MCG/SPRAY nasal spray, Place 2 sprays into the nose daily.   loratadine (CLARITIN) 10 MG tablet, Take 1 tablet (10 mg total) by mouth daily.    Current Outpatient Medications (Other):    Calcium Citrate-Vitamin D3 1000-400 LIQD, Take 1 tablet by mouth daily. Reported on 02/04/2016   clobetasol cream (TEMOVATE) 0.05 %, Apply topically 2 (two) times daily. For 2 weeks, then just on week days as needed   clonazePAM (KLONOPIN) 0.5 MG tablet, Take 0.5 tablets (0.25 mg total) by mouth once as needed for up to 1 dose for anxiety. Take prior to getting on the plane during travel   famotidine (PEPCID) 20 MG tablet, TAKE 1 TABLET(20 MG) BY MOUTH TWICE DAILY BEFORE A MEAL   Garlic 081 MG TABS, Take 1 tablet by mouth daily.    ketotifen (ZADITOR) 0.025 % ophthalmic solution, 1 drop 2 (two) times daily.   mometasone (ELOCON) 0.1 % lotion, Apply topically daily.   Reviewed prior external information including notes and imaging from  primary care provider As well as notes that were available from care everywhere and other healthcare systems.  Past medical history, social, surgical and family history all reviewed in electronic medical record.  No pertanent information unless stated regarding to the chief complaint.   Review  of Systems:  No headache, visual changes, nausea, vomiting, diarrhea, constipation, dizziness, abdominal pain, skin rash, fevers, chills, night sweats, weight loss, swollen lymph nodes, body aches, joint swelling, chest pain, shortness of breath, mood changes. POSITIVE muscle aches  Objective  Blood pressure 132/78, pulse 73, height $RemoveBe'5\' 7"'ShgScNLxb$  (1.702 m), weight 182 lb (82.6 kg), SpO2 98 %.   General: No apparent distress alert and oriented x3 mood and affect normal, dressed appropriately.  HEENT: Pupils equal,  extraocular movements intact  Respiratory: Patient's speak in full sentences and does not appear short of breath  Cardiovascular: 1+ lower extremity edema, non tender, no erythema  Antalgic gait noted Patient's left knee trace effusion noted.  95 degrees of flexion.  Full extension noted.  Instability noted with valgus and varus force    Impression and Recommendations:     The above documentation has been reviewed and is accurate and complete Lyndal Pulley, DO

## 2022-04-21 ENCOUNTER — Ambulatory Visit (INDEPENDENT_AMBULATORY_CARE_PROVIDER_SITE_OTHER): Payer: Medicare Other | Admitting: Family Medicine

## 2022-04-21 DIAGNOSIS — M1712 Unilateral primary osteoarthritis, left knee: Secondary | ICD-10-CM

## 2022-04-21 DIAGNOSIS — N1832 Chronic kidney disease, stage 3b: Secondary | ICD-10-CM | POA: Diagnosis not present

## 2022-04-21 NOTE — Patient Instructions (Addendum)
1/16 to 1/8 inch heel lift could help with ankle Keep doing what you're doing with the knee

## 2022-04-21 NOTE — Assessment & Plan Note (Signed)
Known arthritic changes.  Doing well though with the injection.  Nearly 1 year since we have done 1.  Can repeat if necessary.  Patient is doing well active.  Encouraged her to do so.  We will follow-up with me again in 6 to 8 weeks otherwise.

## 2022-04-28 DIAGNOSIS — I10 Essential (primary) hypertension: Secondary | ICD-10-CM | POA: Diagnosis not present

## 2022-04-28 DIAGNOSIS — N2581 Secondary hyperparathyroidism of renal origin: Secondary | ICD-10-CM | POA: Diagnosis not present

## 2022-04-28 DIAGNOSIS — N1832 Chronic kidney disease, stage 3b: Secondary | ICD-10-CM | POA: Diagnosis not present

## 2022-05-04 ENCOUNTER — Ambulatory Visit (INDEPENDENT_AMBULATORY_CARE_PROVIDER_SITE_OTHER): Payer: Medicare Other | Admitting: Dermatology

## 2022-05-04 DIAGNOSIS — L814 Other melanin hyperpigmentation: Secondary | ICD-10-CM

## 2022-05-04 DIAGNOSIS — L2089 Other atopic dermatitis: Secondary | ICD-10-CM | POA: Diagnosis not present

## 2022-05-04 DIAGNOSIS — L700 Acne vulgaris: Secondary | ICD-10-CM | POA: Diagnosis not present

## 2022-05-04 DIAGNOSIS — D229 Melanocytic nevi, unspecified: Secondary | ICD-10-CM | POA: Diagnosis not present

## 2022-05-04 DIAGNOSIS — L821 Other seborrheic keratosis: Secondary | ICD-10-CM | POA: Diagnosis not present

## 2022-05-04 DIAGNOSIS — L578 Other skin changes due to chronic exposure to nonionizing radiation: Secondary | ICD-10-CM | POA: Diagnosis not present

## 2022-05-04 DIAGNOSIS — L820 Inflamed seborrheic keratosis: Secondary | ICD-10-CM

## 2022-05-04 DIAGNOSIS — R202 Paresthesia of skin: Secondary | ICD-10-CM

## 2022-05-04 DIAGNOSIS — D492 Neoplasm of unspecified behavior of bone, soft tissue, and skin: Secondary | ICD-10-CM

## 2022-05-04 MED ORDER — EUCRISA 2 % EX OINT: TOPICAL_OINTMENT | CUTANEOUS | 2 refills | Status: AC

## 2022-05-04 NOTE — Patient Instructions (Addendum)
Instructions for [facility] Medications For cream for back  One or more of your medications was sent to the [facility] mail order compounding pharmacy. You will receive an email from them and can purchase the medicine through that link. It will then be mailed to your home at the address you confirmed. If for any reason you do not receive an email from them, please check your spam folder. If you still do not find the email, please let us know. [facility] phone number is [phone].      Cryotherapy Aftercare  Wash gently with soap and water everyday.   Apply Vaseline and Band-Aid daily until healed.    Due to recent changes in healthcare laws, you may see results of your pathology and/or laboratory studies on MyChart before the doctors have had a chance to review them. We understand that in some cases there may be results that are confusing or concerning to you. Please understand that not all results are received at the same time and often the doctors may need to interpret multiple results in order to provide you with the best plan of care or course of treatment. Therefore, we ask that you please give [place] 2 business days to thoroughly review all your results before contacting the office for clarification. Should we see a critical lab result, you will be contacted sooner.   If You Need Anything After Your Visit  If you have any questions or concerns for your doctor, please call our main line at [phone] and press option 4 to reach your doctor's medical assistant. If no one answers, please leave a voicemail as directed and we will return your call as soon as possible. Messages left after 4 pm will be answered the following business day.   You may also send [place] a message via [place]. We typically respond to MyChart messages within 1-2 business days.  For prescription refills, please ask your pharmacy to contact our office. Our fax number is [phone].  If you have an urgent  issue when the clinic is closed that cannot wait until the next business day, you can page your doctor at the number below.    Please note that while we do our best to be available for urgent issues outside of office hours, we are not available 24/7.   If you have an urgent issue and are unable to reach [place], you may choose to seek medical care at your doctor's office, retail clinic, urgent care center, or emergency room.  If you have a medical emergency, please immediately call 911 or go to the emergency department.  Pager Numbers  - Dr. Nehemiah Massed: [phone]  - Dr. Laurence Ferrari: [phone]  - Dr. Nicole Kindred: [phone]  In the event of inclement weather, please call our main line at [phone] for an update on the status of any delays or closures.  Dermatology Medication Tips: Please keep the boxes that topical medications come in in order to help keep track of the instructions about where and how to use these. Pharmacies typically print the medication instructions only on the boxes and not directly on the medication tubes.   If your medication is too expensive, please contact our office at [phone] option 4 or send [place] a message through [place].   We are unable to tell what your co-pay for medications will be in advance as this is different depending on your insurance coverage. However, we may be able to find a substitute medication at lower cost or fill out  paperwork to get insurance to cover a needed medication.   If a prior authorization is required to get your medication covered by your insurance company, please allow [place] 1-2 business days to complete this process.  Drug prices often vary depending on where the prescription is filled and some pharmacies may offer cheaper prices.  The website www.goodrx.com contains coupons for medications through different pharmacies. The prices here do not account for what the cost may be with help from insurance (it may be cheaper with your  insurance), but the website can give you the price if you did not use any insurance.  - You can print the associated coupon and take it with your prescription to the pharmacy.  - You may also stop by our office during regular business hours and pick up a GoodRx coupon card.  - If you need your prescription sent electronically to a different pharmacy, notify our office through [facility] or by phone at [phone] option 4.     Si Usted Necesita Algo Despus de Su Visita  Tambin puede enviarnos un mensaje a travs de Pharmacist, community. Por lo general respondemos a los mensajes de MyChart en el transcurso de 1 a 2 das hbiles.  Para renovar recetas, por favor pida a su farmacia que se ponga en contacto con nuestra oficina. Harland Dingwall de fax es [place] [phone].  Si tiene un asunto urgente cuando la clnica est cerrada y que no puede esperar hasta el siguiente da hbil, puede llamar/localizar a su doctor(a) al nmero que aparece a continuacin.   Por favor, tenga en cuenta que aunque hacemos todo lo posible para estar disponibles para asuntos urgentes fuera del horario de [place], no estamos disponibles las 24 horas del da, los 7 das de la [place].   Si tiene un problema urgente y no puede comunicarse con nosotros, puede optar por buscar atencin mdica  en el consultorio de su doctor(a), en una clnica privada, en un centro de atencin urgente o en una sala de emergencias.  Si tiene Engineering geologist, por favor llame inmediatamente al 911 o vaya a la sala de emergencias.  Nmeros de bper  - Dr. Nehemiah Massed: [phone]  - Dra. Moye: [phone]  - Dra. Nicole Kindred: [phone]  En caso de inclemencias del [place], por favor llame a Johnsie Kindred principal al [phone] para una actualizacin sobre el [place] de cualquier retraso o cierre.  Consejos para la medicacin en dermatologa: Por favor, guarde las cajas en las que vienen los medicamentos de uso tpico para ayudarle a  seguir las instrucciones sobre dnde y cmo usarlos. Las farmacias generalmente imprimen las instrucciones del medicamento slo en las cajas y no directamente en los tubos del [place].   Si su medicamento es muy caro, por favor, pngase en contacto con Zigmund Daniel llamando al [phone] y presione la opcin 4 o envenos un mensaje a travs de Pharmacist, community.   No podemos decirle cul ser su copago por los medicamentos por adelantado ya que esto es diferente dependiendo de la cobertura de su seguro. Sin embargo, es posible que podamos encontrar un medicamento sustituto a Electrical engineer un formulario para que el seguro cubra el medicamento que se considera necesario.   Si se requiere una autorizacin previa para que su compaa de seguros [place] su medicamento, por favor permtanos de 1 a 2 das hbiles para completar este proceso.  Los precios de los medicamentos varan con frecuencia dependiendo del Environmental consultant de dnde se surte la receta y [place]  farmacias pueden ofrecer precios ms baratos.  El sitio web www.goodrx.com tiene cupones para medicamentos de Airline pilot. Los precios aqu no tienen en cuenta lo que podra costar con la ayuda del seguro (puede ser ms barato con su seguro), pero el sitio web puede darle el precio si no utiliz Research scientist (physical sciences).  - Puede imprimir el cupn correspondiente y llevarlo con su receta a la farmacia.  - Tambin puede pasar por nuestra oficina durante el horario de atencin regular y Charity fundraiser una tarjeta de cupones de GoodRx.  - Si necesita que su receta se enve electrnicamente a una farmacia diferente, informe a nuestra oficina a travs de MyChart de [place] o por telfono llamando al [phone] y presione la opcin 4.

## 2022-05-04 NOTE — Progress Notes (Unsigned)
New Patient Visit  Subjective  Kanon Colunga is a 76 y.o. female who presents for the following: Rash (Patient c/o itchy skin on the lower legs and back for several months.). The patient has spots, moles and lesions to be evaluated, some may be new or changing and the patient has concerns that these could be cancer.   The following portions of the chart were reviewed this encounter and updated as appropriate:   Tobacco  Allergies  Meds  Problems  Med Hx  Surg Hx  Fam Hx     Review of Systems:  No other skin or systemic complaints except as noted in HPI or Assessment and Plan.  Objective  Well appearing patient in no apparent distress; mood and affect are within normal limits.  A focused examination was performed including face,back,legs. Relevant physical exam findings are noted in the Assessment and Plan.  right cheek Closed comedones  right nose 0.5 cm Slightly indurated patch   lower legs Pink scaly patches   left lateral knee Stuck-on, waxy, tan-brown papule--Discussed benign etiology and prognosis.   back No rash    Assessment & Plan  Comedone right cheek - benign - may be extracted if symptomatic - observe   Neoplasm of skin - indurated patch R nose 0.5 cm Benign appearing actinic elastosis right nose Observe  Recheck in 6 months  If change, may consider biopsy.  Other atopic dermatitis lower legs Atopic dermatitis (eczema) is a chronic, relapsing, pruritic condition that can significantly affect quality of life. It is often associated with allergic rhinitis and/or asthma and can require treatment with topical medications, phototherapy, or in severe cases biologic injectable medication (Dupixent; Adbry) or Oral JAK inhibitors.   Start Eucrisa ointment apply to affected skin once a day  Related Medications Crisaborole (EUCRISA) 2 % OINT Apply to affected skin once a day  Inflamed seborrheic keratosis left lateral knee Symptomatic,  irritating, patient would like treated.  Destruction of lesion - left lateral knee Destruction method: cryotherapy   Informed consent: discussed and consent obtained   Lesion destroyed using liquid nitrogen: Yes   Region frozen until ice ball extended beyond lesion: Yes   Outcome: patient tolerated procedure well with no complications   Post-procedure details: wound care instructions given    Notalgia paresthetica back Notalgia paresthetica is a chronic condition affecting the skin of the back in which a pinched nerve along the spine causes itching or changes in sensation in an area of skin. This is usually accompanied by chronic rubbing or scratching often leaving the area of skin discolored and thickened. There is no cure, but there are some treatments which may help control the itch.   Over the counter (non-prescription) treatments for notalgia paresthetica include numbing creams like pramoxine or lidocaine which temporarily reduce itch or Capsaicin-containing creams which cause a burning sensation but which sometimes over time will reset the nerves to stop producing itch.  If you choose to use Capsaicin cream, it is recommended to use it 5 times daily for 1 week followed by 3 times daily for 3-6 weeks. You may have to continue using it long-term. For severe cases, there are some prescription cream or pill options which may help. Other treatment options include: - Transcutaneous Electrical Nerve Stimulation (TENS) - Gabapentin 300-900 mg daily po - Amitriptyline orally - Paravertebral local anesthetic block - intralesional Botulinum toxin A   Start skin medicinals  Amitriptyline: 5% Gabapentin: 10% Lidocaine: 5% Vehicle: Cream Apply to affected skin qd-bid  Melanocytic Nevi face - Tan-brown and/or pink-flesh-colored symmetric macules and papules - Benign appearing on exam today - Observation - Call clinic for new or changing moles - Recommend daily use of broad spectrum spf 30+  sunscreen to sun-exposed areas.    Seborrheic Keratoses face - Stuck-on, waxy, tan-brown papules and/or plaques  - Benign-appearing - Discussed benign etiology and prognosis. - Observe - Call for any changes  Lentigines face - Scattered tan macules - Due to sun exposure - Benign-appearing, observe - Recommend daily broad spectrum sunscreen SPF 30+ to sun-exposed areas, reapply every 2 hours as needed. - Call for any changes   Actinic Damage - chronic, secondary to cumulative UV radiation exposure/sun exposure over time - diffuse scaly erythematous macules with underlying dyspigmentation - Recommend daily broad spectrum sunscreen SPF 30+ to sun-exposed areas, reapply every 2 hours as needed.  - Recommend staying in the shade or wearing long sleeves, sun glasses (UVA+UVB protection) and wide brim hats (4-inch brim around the entire circumference of the hat). - Call for new or changing lesions.  Return in about 6 months (around 11/02/2022) for recheck area on the nose.  IMarye Round, CMA, am acting as scribe for Sarina Ser, MD .  Documentation: I have reviewed the above documentation for accuracy and completeness, and I agree with the above.  Sarina Ser, MD

## 2022-05-05 ENCOUNTER — Ambulatory Visit: Payer: Medicare Other | Admitting: Dermatology

## 2022-05-05 ENCOUNTER — Encounter: Payer: Self-pay | Admitting: Dermatology

## 2022-05-12 DIAGNOSIS — H60543 Acute eczematoid otitis externa, bilateral: Secondary | ICD-10-CM | POA: Diagnosis not present

## 2022-05-16 DIAGNOSIS — M6289 Other specified disorders of muscle: Secondary | ICD-10-CM | POA: Diagnosis not present

## 2022-05-16 DIAGNOSIS — K640 First degree hemorrhoids: Secondary | ICD-10-CM | POA: Diagnosis not present

## 2022-05-16 DIAGNOSIS — K5909 Other constipation: Secondary | ICD-10-CM | POA: Diagnosis not present

## 2022-05-16 DIAGNOSIS — R1319 Other dysphagia: Secondary | ICD-10-CM | POA: Diagnosis not present

## 2022-05-17 ENCOUNTER — Other Ambulatory Visit: Payer: Self-pay | Admitting: Family Medicine

## 2022-05-17 ENCOUNTER — Encounter: Payer: Self-pay | Admitting: Family Medicine

## 2022-05-17 ENCOUNTER — Ambulatory Visit (INDEPENDENT_AMBULATORY_CARE_PROVIDER_SITE_OTHER): Payer: Medicare Other | Admitting: Family Medicine

## 2022-05-17 VITALS — BP 130/70 | HR 94 | Temp 99.2°F | Ht 67.0 in | Wt 182.4 lb

## 2022-05-17 DIAGNOSIS — I10 Essential (primary) hypertension: Secondary | ICD-10-CM | POA: Diagnosis not present

## 2022-05-17 DIAGNOSIS — Z23 Encounter for immunization: Secondary | ICD-10-CM

## 2022-05-17 DIAGNOSIS — K649 Unspecified hemorrhoids: Secondary | ICD-10-CM | POA: Diagnosis not present

## 2022-05-17 DIAGNOSIS — E559 Vitamin D deficiency, unspecified: Secondary | ICD-10-CM | POA: Diagnosis not present

## 2022-05-17 DIAGNOSIS — L299 Pruritus, unspecified: Secondary | ICD-10-CM | POA: Diagnosis not present

## 2022-05-17 DIAGNOSIS — R252 Cramp and spasm: Secondary | ICD-10-CM | POA: Diagnosis not present

## 2022-05-17 DIAGNOSIS — M791 Myalgia, unspecified site: Secondary | ICD-10-CM | POA: Insufficient documentation

## 2022-05-17 DIAGNOSIS — U071 COVID-19: Secondary | ICD-10-CM

## 2022-05-17 LAB — COMPREHENSIVE METABOLIC PANEL
ALT: 15 U/L (ref 0–35)
AST: 17 U/L (ref 0–37)
Albumin: 4.4 g/dL (ref 3.5–5.2)
Alkaline Phosphatase: 59 U/L (ref 39–117)
BUN: 17 mg/dL (ref 6–23)
CO2: 28 mEq/L (ref 19–32)
Calcium: 9.9 mg/dL (ref 8.4–10.5)
Chloride: 105 mEq/L (ref 96–112)
Creatinine, Ser: 1.28 mg/dL — ABNORMAL HIGH (ref 0.40–1.20)
GFR: 40.13 mL/min — ABNORMAL LOW (ref >60.00)
Glucose, Bld: 96 mg/dL (ref 70–99)
Potassium: 4.1 mEq/L (ref 3.5–5.1)
Sodium: 140 mEq/L (ref 135–145)
Total Bilirubin: 0.5 mg/dL (ref 0.2–1.2)
Total Protein: 7.4 g/dL (ref 6.0–8.3)

## 2022-05-17 LAB — VITAMIN D 25 HYDROXY (VIT D DEFICIENCY, FRACTURES): VITD: 38.37 ng/mL (ref 30.00–100.00)

## 2022-05-17 LAB — CK: Total CK: 125 U/L (ref 7–177)

## 2022-05-17 LAB — TSH: TSH: 2.41 u[IU]/mL (ref 0.35–5.50)

## 2022-05-17 LAB — MAGNESIUM: Magnesium: 1.7 mg/dL (ref 1.5–2.5)

## 2022-05-17 NOTE — Assessment & Plan Note (Signed)
Patient under a fair bit of stress with her grandson being hospitalized after falling off the couch.  She will continue amlodipine 5 mg daily.  We will recheck her blood pressure at her next visit.  It did improve after rechecking it in the office.

## 2022-05-17 NOTE — Progress Notes (Signed)
[name], MD Phone: [phone]  Lindsey Foster is a 76 y.o. female who presents today for f/u.  HYPERTENSION Disease Monitoring Home BP Monitoring not checking Chest pain- no    Dyspnea- no Medications Compliance-  taking amlodipine 5 mg daily.   Edema- no BMET    Component Value Date/Time   NA 137 08/12/2021 0955   NA 142 07/04/2014 1041   K 4.0 08/12/2021 0955   K 3.9 07/04/2014 1041   CL 100 08/12/2021 0955   CL 108 (H) 07/04/2014 1041   CO2 30 08/12/2021 0955   CO2 30 07/04/2014 1041   GLUCOSE 95 08/12/2021 0955   GLUCOSE 86 07/04/2014 1041   BUN 18 08/12/2021 0955   BUN 12 07/04/2014 1041   CREATININE 1.15 (H) 08/12/2021 0955   CREATININE 1.18 07/04/2014 1041   CALCIUM 9.4 08/12/2021 0955   CALCIUM 8.8 07/04/2014 1041   GFRNONAA 49 (L) 08/12/2021 0955   GFRNONAA 48 (L) 07/04/2014 1041   GFRNONAA 42 (L) 07/04/2013 1048   GFRAA 50 (L) 04/13/2020 1243   GFRAA 58 (L) 07/04/2014 1041   GFRAA 49 (L) 07/04/2013 1048   COVID-19: Patient had this back in August.  She has generally recovered well though does note still having an occasional tickle in her throat with some mucus.  She notes no postnasal drip or congestion.  No rhinorrhea.  She takes Claritin intermittently though not consistently.  Itching: This is widespread at times.  Muscle cramps/muscle soreness: This has been going on worse since she had COVID.  She notes in the past when this is occurred her vitamin D has been low.  Hemorrhoids: Patient notes she has seen GI and they are going to deal with this.  They gave her some suppositories to use to help with her symptoms.  Social History   Tobacco Use  Smoking Status Former  Smokeless Tobacco Never    Current Outpatient Medications on File Prior to Visit  Medication Sig Dispense Refill   amLODipine (NORVASC) 2.5 MG tablet Take 5 mg by mouth daily.     Calcium Citrate-Vitamin D3 1000-400 LIQD Take 1 tablet by mouth daily. Reported on  02/04/2016     clobetasol cream (TEMOVATE) 0.05 % Apply topically 2 (two) times daily. For 2 weeks, then just on week days as needed 60 g 1   clonazePAM (KLONOPIN) 0.5 MG tablet Take 0.5 tablets (0.25 mg total) by mouth once as needed for up to 1 dose for anxiety. Take prior to getting on the plane during travel 5 tablet 0   Crisaborole (EUCRISA) 2 % OINT Apply to affected skin once a day 60 g 2   famotidine (PEPCID) 20 MG tablet TAKE 1 TABLET(20 MG) BY MOUTH TWICE DAILY BEFORE A MEAL 180 tablet 1   fluticasone (FLONASE SENSIMIST) 27.5 MCG/SPRAY nasal spray Place 2 sprays into the nose daily. 10 g 2   Garlic 585 MG TABS Take 1 tablet by mouth daily.      ketotifen (ZADITOR) 0.025 % ophthalmic solution 1 drop 2 (two) times daily.     loratadine (CLARITIN) 10 MG tablet Take 1 tablet (10 mg total) by mouth daily. 30 tablet 11   mometasone (ELOCON) 0.1 % lotion Apply topically daily.     No current facility-administered medications on file prior to visit.     ROS see history of present illness  Objective  Physical Exam Vitals:   05/17/22 1052 05/17/22 1306  BP: (!) 150/70 130/70  Pulse: 94   Temp:  99.2 F (37.3 C)   SpO2: 97%     BP Readings from Last 3 Encounters:  05/17/22 130/70  04/21/22 132/78  02/08/22 120/80   Wt Readings from Last 3 Encounters:  05/17/22 182 lb 6.4 oz (82.7 kg)  04/21/22 182 lb (82.6 kg)  03/28/22 181 lb (82.1 kg)    Physical Exam Constitutional:      General: She is not in acute distress.    Appearance: She is not diaphoretic.  Cardiovascular:     Rate and Rhythm: Normal rate and regular rhythm.     Heart sounds: Normal heart sounds.  Pulmonary:     Effort: Pulmonary effort is normal.     Breath sounds: Normal breath sounds.  Skin:    General: Skin is warm and dry.  Neurological:     Mental Status: She is alert.      Assessment/Plan: Please see individual problem list.  Problem List Items Addressed This Visit     Essential  hypertension (Chronic)    Patient under a fair bit of stress with her grandson being hospitalized after falling off the couch.  She will continue amlodipine 5 mg daily.  We will recheck her blood pressure at her next visit.  It did improve after rechecking it in the office.      Hemorrhoids (Chronic)    She will continue to see GI.      Vitamin D deficiency (Chronic)   Relevant Orders   Vitamin D (25 hydroxy)   COVID-19    Patient has generally recovered well.  She has a little tickle in her throat with an occasional cough which I suspect will improve over the next several weeks.  If it does not improve she will let us know.  She can take Claritin once daily as that may help with any residual postnasal drip that could be contributing to her cough.      Itching    Chronic issue.  We are rechecking her hepatic function panel.  She should take Claritin once daily.      Leg cramps   Muscle cramping    Check electrolytes.      Myalgia - Primary    Possibly related to having had COVID.  We will check lab work as outlined.  Discussed if it is COVID-related it would likely improve over time.      Relevant Orders   Comp Met (CMET)   TSH   CK (Creatine Kinase)   Other Visit Diagnoses     Muscle cramps       Relevant Orders   Comp Met (CMET)   Magnesium   Need for immunization against influenza       Relevant Orders   Flu Vaccine QUAD High Dose(Fluad) (Completed)       Return in about 3 months (around 08/17/2022).  I have spent 41 minutes in the care of this patient regarding precharting, history taking, documentation, completion of exam, placing orders, discussion of plan.   [name], MD [place]

## 2022-05-17 NOTE — Assessment & Plan Note (Signed)
Possibly related to having had COVID.  We will check lab work as outlined.  Discussed if it is COVID-related it would likely improve over time.

## 2022-05-17 NOTE — Assessment & Plan Note (Signed)
Check electrolytes

## 2022-05-17 NOTE — Assessment & Plan Note (Signed)
Chronic issue.  We are rechecking her hepatic function panel.  She should take Claritin once daily.

## 2022-05-17 NOTE — Patient Instructions (Signed)
Nice to see. We will get lab work today and contact you with the results. Please make sure you take your Claritin daily to see if that helps with the tickle in your throat.  If you continue to have cough over the next couple of weeks please let me know. Please try to show up 15 minutes prior to your appointment time in the future.

## 2022-05-17 NOTE — Assessment & Plan Note (Signed)
She will continue to see GI. 

## 2022-05-17 NOTE — Assessment & Plan Note (Addendum)
Patient has generally recovered well.  She has a little tickle in her throat with an occasional cough which I suspect will improve over the next several weeks.  If it does not improve she will let us know.  She can take Claritin once daily as that may help with any residual postnasal drip that could be contributing to her cough.

## 2022-05-20 ENCOUNTER — Other Ambulatory Visit: Payer: Self-pay

## 2022-05-20 ENCOUNTER — Telehealth: Payer: Self-pay

## 2022-05-20 DIAGNOSIS — I10 Essential (primary) hypertension: Secondary | ICD-10-CM

## 2022-05-20 MED ORDER — AMLODIPINE BESYLATE 5 MG PO TABS: 5.0000 mg | ORAL_TABLET | Freq: Every day | ORAL | 1 refills | Status: DC

## 2022-05-20 MED ORDER — AMLODIPINE BESYLATE 2.5 MG PO TABS: 5.0000 mg | ORAL_TABLET | Freq: Every day | ORAL | 1 refills | Status: DC

## 2022-05-20 NOTE — Telephone Encounter (Signed)
Pt called stating she ask for a refill on amlodipine and the pharmacy called stating the refill was only for 2.'5mg'$  of the medication

## 2022-05-20 NOTE — Telephone Encounter (Signed)
Prescription was sent to pharmacy.  Yuuki Skeens,cma

## 2022-05-20 NOTE — Telephone Encounter (Signed)
Patient states she needs a refill for her Amlodipine 5 MG.  Patient states she spoke with her pharmacy and she is not due for a refill until 05/27/2022, but she has no refills.  Patient states her pharmacy submitted the request to [place] for the refill and her pharmacy told her the request was denied because Dr. [name] felt it was not appropriate.  Patient states she is leaving for [place] on 05/27/2022 and needs to have her medication before she goes.  Patient states she has to be at the airport by 3pm on 05/27/2022.

## 2022-05-20 NOTE — Telephone Encounter (Signed)
I sent in the 2.5 mg of the amlodipine and it was a error, I sent in the 5 mg tablet  to the patient's pharmacy and patient was notified.  Margueritte Guthridge,cma

## 2022-05-27 ENCOUNTER — Other Ambulatory Visit: Payer: Self-pay

## 2022-05-27 DIAGNOSIS — Z1231 Encounter for screening mammogram for malignant neoplasm of breast: Secondary | ICD-10-CM

## 2022-05-27 DIAGNOSIS — D0511 Intraductal carcinoma in situ of right breast: Secondary | ICD-10-CM

## 2022-07-26 NOTE — Progress Notes (Unsigned)
[facility] [place] Phone: [phone] Subjective:   Lindsey Foster, am serving as a scribe for Dr. Hulan Saas.  I'm seeing this patient by the request  of:  [name], MD  CC: Left knee pain follow-up  VPX:[id number]  04/21/2022 Known arthritic changes.  Doing well though with the injection.  Nearly 1 year since we have done 1.  Can repeat if necessary.  Patient is doing well active.  Encouraged her to do so.  We will follow-up with me again in 6 to 8 weeks otherwise.      Update 07/28/2022 Lindsey Foster is a 76 y.o. female coming in with complaint of L knee pain. Patient states that her knee is not doing so great at the moment. Patient had helped fix a rental up for her son and the knee has been bothering her, no falls and is able to move. Worse in the morning, better once she gets going. Patient is hoping that now that she is done helping that she would feel better. The left shin is bothering her some as well.         Past Medical History:  Diagnosis Date   Allergic rhinitis    Anxiety    panic attacks in hot rooms   Arthritis    Blood in stool    Breast cancer ([place]) 2006   radiation - Rt   Chickenpox    CKD (chronic kidney disease)    Colon polyps    Diverticulosis    GERD (gastroesophageal reflux disease)    Heart murmur    Hyperlipidemia    Hypertension    Hypothyroidism    Multiple thyroid nodules    Osteopenia    Personal history of radiation therapy    Urinary incontinence    UTI (lower urinary tract infection)    Vitamin D deficiency disease    Past Surgical History:  Procedure Laterality Date   BLADDER SURGERY     BREAST EXCISIONAL BIOPSY Right 2006   positive   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   CESAREAN SECTION  1985   COLONOSCOPY N/A 04/30/2021   Procedure: COLONOSCOPY;  Surgeon: [name], MD;  Location: [facility] ENDOSCOPY;  Service: Endoscopy;  Laterality:  N/A;   COLONOSCOPY WITH PROPOFOL N/A 09/28/2015   Procedure: COLONOSCOPY WITH PROPOFOL;  Surgeon: Lollie Sails, MD;  Location: [facility] ENDOSCOPY;  Service: Endoscopy;  Laterality: N/A;   DILATION AND CURETTAGE OF UTERUS  2012   Thyroid nodule ablation  2003   Uterine polyp removal  2009    Social History   Socioeconomic History   Marital status: Widowed    Spouse name: Not on file   Number of children: Not on file   Years of education: Not on file   Highest education level: Not on file  Occupational History   Not on file  Tobacco Use   Smoking status: Former   Smokeless tobacco: Never  Vaping Use   Vaping Use: Never used  Substance and Sexual Activity   Alcohol use: No    Alcohol/week: 0.0 standard drinks of alcohol   Drug use: No   Sexual activity: Not on file  Other Topics Concern   Not on file  Social History Narrative   Not on file   Social Determinants of Health   Financial Resource Strain: Low Risk  (03/11/2022)   Overall Financial Resource Strain (CARDIA)    Difficulty of  Paying Living Expenses: Not hard at all  Food Insecurity: No Food Insecurity (03/11/2022)   Hunger Vital Sign    Worried About Running Out of Food in the Last Year: Never true    Ran Out of Food in the Last Year: Never true  Transportation Needs: No Transportation Needs (03/11/2022)   PRAPARE - Hydrologist (Medical): No    Lack of Transportation (Non-Medical): No  Physical Activity: Insufficiently Active (03/11/2022)   Exercise Vital Sign    Days of Exercise per Week: 2 days    Minutes of Exercise per Session: 30 min  Stress: No Stress Concern Present (03/11/2022)   [place]    Feeling of Stress : Not at all  Social Connections: Unknown (03/11/2022)   Social Connection and Isolation Panel [NHANES]    Frequency of Communication with Friends and Family: More than three times a week    Frequency of  Social Gatherings with Friends and Family: Not on file    Attends Religious Services: Not on file    Active Member of Clubs or Organizations: Not on file    Attends Archivist Meetings: Not on file    Marital Status: Not on file   Allergies  Allergen Reactions   Latex Rash   Family History  Problem Relation Age of Onset   Angina Father    Heart attack Father    Multiple myeloma Brother    Uterine cancer Maternal Aunt    Heart disease Other        Parent   Breast cancer Neg Hx      Current Outpatient Medications (Cardiovascular):    amLODipine (NORVASC) 5 MG tablet, Take 1 tablet (5 mg total) by mouth daily.  Current Outpatient Medications (Respiratory):    fluticasone (FLONASE SENSIMIST) 27.5 MCG/SPRAY nasal spray, Place 2 sprays into the nose daily.   loratadine (CLARITIN) 10 MG tablet, Take 1 tablet (10 mg total) by mouth daily.    Current Outpatient Medications (Other):    Calcium Citrate-Vitamin D3 1000-400 LIQD, Take 1 tablet by mouth daily. Reported on 02/04/2016   clobetasol cream (TEMOVATE) 0.05 %, Apply topically 2 (two) times daily. For 2 weeks, then just on week days as needed   clonazePAM (KLONOPIN) 0.5 MG tablet, Take 0.5 tablets (0.25 mg total) by mouth once as needed for up to 1 dose for anxiety. Take prior to getting on the plane during travel   Crisaborole (EUCRISA) 2 % OINT, Apply to affected skin once a day   famotidine (PEPCID) 20 MG tablet, TAKE 1 TABLET(20 MG) BY MOUTH TWICE DAILY BEFORE A MEAL   Garlic 409 MG TABS, Take 1 tablet by mouth daily.    ketotifen (ZADITOR) 0.025 % ophthalmic solution, 1 drop 2 (two) times daily.   mometasone (ELOCON) 0.1 % lotion, Apply topically daily.   Reviewed prior external information including notes and imaging from  primary care provider As well as notes that were available from care everywhere and other healthcare systems.  Past medical history, social, surgical and family history all reviewed in  electronic medical record.  No pertanent information unless stated regarding to the chief complaint.   Review of Systems:  No headache, visual changes, nausea, vomiting, diarrhea, constipation, dizziness, abdominal pain, skin rash, fevers, chills, night sweats, weight loss, swollen lymph nodes, body aches, joint swelling, chest pain, shortness of breath, mood changes. POSITIVE muscle aches  Objective  Blood pressure 122/82, pulse  72, height _0  (1.702 m), weight 180 lb (81.6 kg), SpO2 97 %.   General: No apparent distress alert and oriented x3 mood and affect normal, dressed appropriately.  HEENT: Pupils equal, extraocular movements intact  Respiratory: Patient's speak in full sentences and does not appear short of breath  Cardiovascular: Trace lower extremity edema, non tender, no erythema  Left knee exam does have significant varus deformity of the knee noted.  Instability noted with valgus and varus force.  Effusion noted of the patellofemoral space.  Patient does have a lacking the last 15 degrees of flexion.  After informed written and verbal consent, patient was seated on exam table. Left knee was prepped with alcohol swab and utilizing anterolateral approach, patient's left knee space was injected with 4:1  marcaine 0.5%: Kenalog 37m/dL. Patient tolerated the procedure well without immediate complications.    Impression and Recommendations:     The above documentation has been reviewed and is accurate and complete ZLyndal Pulley DO

## 2022-07-28 ENCOUNTER — Encounter: Payer: Self-pay | Admitting: Family Medicine

## 2022-07-28 ENCOUNTER — Ambulatory Visit (INDEPENDENT_AMBULATORY_CARE_PROVIDER_SITE_OTHER): Payer: Medicare Other | Admitting: Family Medicine

## 2022-07-28 VITALS — BP 122/82 | HR 72 | Ht 67.0 in | Wt 180.0 lb

## 2022-07-28 DIAGNOSIS — M1712 Unilateral primary osteoarthritis, left knee: Secondary | ICD-10-CM

## 2022-07-28 NOTE — Assessment & Plan Note (Signed)
Severe arthritic changes mostly of the medial and lateral compartment.  Discussed with patient again at great length and patient wants to avoid any surgical intervention.  Discussed with patient icing regimen and home exercises otherwise.  Discussed with the possibility of viscosupplementation.  Chronic problem with worsening symptoms.  Follow-up again in 6 to 8 weeks.

## 2022-07-28 NOTE — Patient Instructions (Addendum)
Good to see you  Injection in left knee today [facility]  Follow up in 2-3 months

## 2022-08-11 ENCOUNTER — Ambulatory Visit: Payer: Medicare Other | Admitting: Oncology

## 2022-08-11 ENCOUNTER — Other Ambulatory Visit: Payer: Medicare Other

## 2022-08-16 ENCOUNTER — Encounter: Payer: Self-pay | Admitting: Oncology

## 2022-08-16 ENCOUNTER — Inpatient Hospital Stay (HOSPITAL_BASED_OUTPATIENT_CLINIC_OR_DEPARTMENT_OTHER): Payer: Medicare Other | Admitting: Oncology

## 2022-08-16 ENCOUNTER — Inpatient Hospital Stay: Payer: Medicare Other | Attending: Oncology

## 2022-08-16 VITALS — BP 142/72 | HR 60 | Temp 96.9°F | Resp 18 | Wt 182.1 lb

## 2022-08-16 DIAGNOSIS — N183 Chronic kidney disease, stage 3 unspecified: Secondary | ICD-10-CM

## 2022-08-16 DIAGNOSIS — M810 Age-related osteoporosis without current pathological fracture: Secondary | ICD-10-CM | POA: Insufficient documentation

## 2022-08-16 DIAGNOSIS — Z923 Personal history of irradiation: Secondary | ICD-10-CM | POA: Insufficient documentation

## 2022-08-16 DIAGNOSIS — D0511 Intraductal carcinoma in situ of right breast: Secondary | ICD-10-CM

## 2022-08-16 DIAGNOSIS — Z86000 Personal history of in-situ neoplasm of breast: Secondary | ICD-10-CM | POA: Diagnosis not present

## 2022-08-16 LAB — CBC WITH DIFFERENTIAL/PLATELET
Abs Immature Granulocytes: 0.01 10*3/uL (ref 0.00–0.07)
Basophils Absolute: 0 10*3/uL (ref 0.0–0.1)
Basophils Relative: 1 %
Eosinophils Absolute: 0.1 10*3/uL (ref 0.0–0.5)
Eosinophils Relative: 3 %
HCT: 37.9 % (ref 36.0–46.0)
Hemoglobin: 12.2 g/dL (ref 12.0–15.0)
Immature Granulocytes: 0 %
Lymphocytes Relative: 33 %
Lymphs Abs: 1.2 10*3/uL (ref 0.7–4.0)
MCH: 27.4 pg (ref 26.0–34.0)
MCHC: 32.2 g/dL (ref 30.0–36.0)
MCV: 85 fL (ref 80.0–100.0)
Monocytes Absolute: 0.5 10*3/uL (ref 0.1–1.0)
Monocytes Relative: 13 %
Neutro Abs: 1.9 10*3/uL (ref 1.7–7.7)
Neutrophils Relative %: 50 %
Platelets: 208 10*3/uL (ref 150–400)
RBC: 4.46 MIL/uL (ref 3.87–5.11)
RDW: 14.4 % (ref 11.5–15.5)
WBC: 3.7 10*3/uL — ABNORMAL LOW (ref 4.0–10.5)
nRBC: 0 % (ref 0.0–0.2)

## 2022-08-16 LAB — COMPREHENSIVE METABOLIC PANEL
ALT: 19 U/L (ref 0–44)
AST: 20 U/L (ref 15–41)
Albumin: 4 g/dL (ref 3.5–5.0)
Alkaline Phosphatase: 65 U/L (ref 38–126)
Anion gap: 7 (ref 5–15)
BUN: 19 mg/dL (ref 8–23)
CO2: 28 mmol/L (ref 22–32)
Calcium: 9.4 mg/dL (ref 8.9–10.3)
Chloride: 106 mmol/L (ref 98–111)
Creatinine, Ser: 1.22 mg/dL — ABNORMAL HIGH (ref 0.44–1.00)
GFR, Estimated: 45 mL/min — ABNORMAL LOW (ref >60)
Glucose, Bld: 99 mg/dL (ref 70–99)
Potassium: 4.2 mmol/L (ref 3.5–5.1)
Sodium: 141 mmol/L (ref 135–145)
Total Bilirubin: 0.4 mg/dL (ref 0.3–1.2)
Total Protein: 7.4 g/dL (ref 6.5–8.1)

## 2022-08-16 NOTE — Assessment & Plan Note (Signed)
Encourage oral hydration and avoid nephrotoxins.   

## 2022-08-16 NOTE — Progress Notes (Signed)
Clinic Day:  08/16/2022  Referring physician: [name], MD  Chief Complaint: Lindsey Foster is a 77 y.o. female with a history of DCIS in the right breast presents to follow up   Pertinent oncology history Lindsey Foster is a 77 y.o. female who has above history reviewed by me today presents for follow up visit for history of DCIS.  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  She received radiation to the right breast from 08/2005 - 09/2005 at the [facility] in [place].  She then received tamoxifen for 5 years (12/2005-11/2010).  INTERVAL HISTORY Lindsey Foster is a 77 y.o. female who has above history reviewed by me today presents for follow up visit for  History of DCIS, osteoporosis. Occasional she has itchiness of bilateral breasts-she describes once to twice per year.  Patient denies any other new breast concerns.   Past Medical History:  Diagnosis Date   Allergic rhinitis    Anxiety    panic attacks in hot rooms   Arthritis    Blood in stool    Breast cancer ([place]) 2006   radiation - Rt   Chickenpox    CKD (chronic kidney disease)    Colon polyps    Diverticulosis    GERD (gastroesophageal reflux disease)    Heart murmur    Hyperlipidemia    Hypertension    Hypothyroidism    Multiple thyroid nodules    Osteopenia    Personal history of radiation therapy    Urinary incontinence    UTI (lower urinary tract infection)    Vitamin D deficiency disease     Past Surgical History:  Procedure Laterality Date   BLADDER SURGERY     BREAST EXCISIONAL BIOPSY Right 2006   positive   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   CESAREAN SECTION  1985   COLONOSCOPY N/A 04/30/2021   Procedure: COLONOSCOPY;  Surgeon: [name], MD;  Location: [facility] ENDOSCOPY;  Service:  Endoscopy;  Laterality: N/A;   COLONOSCOPY WITH PROPOFOL N/A 09/28/2015   Procedure: COLONOSCOPY WITH PROPOFOL;  Surgeon: Lollie Sails, MD;  Location: [facility] ENDOSCOPY;  Service: Endoscopy;  Laterality: N/A;   DILATION AND CURETTAGE OF UTERUS  2012   Thyroid nodule ablation  2003   Uterine polyp removal  2009     Family History  Problem Relation Age of Onset   Angina Father    Heart attack Father    Multiple myeloma Brother    Uterine cancer Maternal Aunt    Heart disease Other        Parent   Breast cancer Neg Hx     Social History:  reports that she has quit smoking. She has never used smokeless tobacco. She reports that she does not drink alcohol and does not use drugs.The patient is alone today.  Allergies:  Allergies  Allergen Reactions   Latex Rash    Current Medications: Current Outpatient Medications  Medication Sig Dispense Refill   amLODipine (NORVASC) 5 MG tablet Take 1 tablet (5 mg total) by mouth daily. 90 tablet 1   Calcium Citrate-Vitamin D3 1000-400 LIQD Take 1 tablet by mouth daily. Reported on 02/04/2016     clobetasol cream (TEMOVATE) 0.05 % Apply topically 2 (two) times daily. For 2 weeks, then just on week days  as needed 60 g 1   clonazePAM (KLONOPIN) 0.5 MG tablet Take 0.5 tablets (0.25 mg total) by mouth once as needed for up to 1 dose for anxiety. Take prior to getting on the plane during travel 5 tablet 0   Crisaborole (EUCRISA) 2 % OINT Apply to affected skin once a day 60 g 2   famotidine (PEPCID) 20 MG tablet TAKE 1 TABLET(20 MG) BY MOUTH TWICE DAILY BEFORE A MEAL 180 tablet 1   fluticasone (FLONASE SENSIMIST) 27.5 MCG/SPRAY nasal spray Place 2 sprays into the nose daily. 10 g 2   Garlic 606 MG TABS Take 1 tablet by mouth daily.      ketotifen (ZADITOR) 0.025 % ophthalmic solution 1 drop 2 (two) times daily.     loratadine (CLARITIN) 10 MG tablet Take 1 tablet (10 mg total) by mouth daily. 30 tablet 11   mometasone (ELOCON) 0.1 % lotion Apply  topically daily.     No current facility-administered medications for this visit.    Review of Systems  Constitutional:  Negative for chills, diaphoresis, fever, malaise/fatigue and weight loss.  HENT:  Negative for congestion, ear discharge, ear pain, hearing loss, nosebleeds, sinus pain, sore throat and tinnitus.   Eyes:  Negative for blurred vision.  Respiratory:  Negative for cough, hemoptysis, sputum production and shortness of breath.   Cardiovascular:  Negative for chest pain, palpitations and leg swelling.  Gastrointestinal:  Negative for abdominal pain, blood in stool, constipation, diarrhea, heartburn, melena, nausea and vomiting.  Genitourinary:  Negative for dysuria, frequency, hematuria and urgency.  Musculoskeletal:  Negative for back pain, joint pain (arthritis in left knee), myalgias and neck pain.  Skin:  Positive for itching (whole body at night). Negative for rash.  Neurological:  Negative for dizziness, tingling, sensory change, weakness and headaches.  Endo/Heme/Allergies:  Does not bruise/bleed easily.  Psychiatric/Behavioral:  Negative for depression and memory loss. The patient is not nervous/anxious and does not have insomnia.   All other systems reviewed and are negative.  Performance status (ECOG): 0  Vitals Blood pressure (!) 142/72, pulse 60, temperature (!) 96.9 F (36.1 C), resp. rate 18, weight 182 lb 1.6 oz (82.6 kg).   Physical Exam Vitals and nursing note reviewed.  Constitutional:      General: She is not in acute distress.    Appearance: She is not diaphoretic.  HENT:     Head: Normocephalic and atraumatic.     Mouth/Throat:     Mouth: Mucous membranes are moist.     Pharynx: Oropharynx is clear.  Eyes:     General: No scleral icterus.    Extraocular Movements: Extraocular movements intact.     Conjunctiva/sclera: Conjunctivae normal.     Pupils: Pupils are equal, round, and reactive to light.  Cardiovascular:     Rate and Rhythm: Normal  rate and regular rhythm.     Heart sounds: Normal heart sounds. No murmur heard. Pulmonary:     Effort: Pulmonary effort is normal. No respiratory distress.     Breath sounds: Normal breath sounds. No wheezing or rales.  Chest:     Chest wall: No tenderness.  Breasts:    Right: Skin change (scarring around nipple from 3-6 o'clock) present. No swelling, bleeding, mass or tenderness.     Left: No swelling, bleeding, mass, skin change or tenderness.  Abdominal:     General: Bowel sounds are normal. There is no distension.     Palpations: Abdomen is soft. There is no hepatomegaly, splenomegaly  or mass.     Tenderness: There is no abdominal tenderness. There is no guarding or rebound.  Musculoskeletal:        General: No swelling or tenderness. Normal range of motion.     Cervical back: Normal range of motion and neck supple.  Lymphadenopathy:     Head:     Right side of head: No preauricular, posterior auricular or occipital adenopathy.     Left side of head: No preauricular, posterior auricular or occipital adenopathy.     Cervical: No cervical adenopathy.     Upper Body:     Right upper body: No supraclavicular or axillary adenopathy.     Left upper body: No supraclavicular or axillary adenopathy.     Lower Body: No right inguinal adenopathy. No left inguinal adenopathy.  Skin:    General: Skin is warm and dry.  Neurological:     Mental Status: She is alert and oriented to person, place, and time.  Psychiatric:        Behavior: Behavior normal.        Thought Content: Thought content normal.        Judgment: Judgment normal.       Labs    Latest Ref Rng & Units 08/16/2022   10:08 AM 08/12/2021    9:55 AM 08/13/2020   10:15 AM  CBC  WBC 4.0 - 10.5 K/uL 3.7  4.5  3.4   Hemoglobin 12.0 - 15.0 g/dL 12.2  12.2  13.0   Hematocrit 36.0 - 46.0 % 37.9  38.4  40.0   Platelets 150 - 400 K/uL 208  229  215       Latest Ref Rng & Units 08/16/2022   10:08 AM 05/17/2022   11:12 AM  02/08/2022   10:39 AM  CMP  Glucose 70 - 99 mg/dL 99  96    BUN 8 - 23 mg/dL 19  17    Creatinine 0.44 - 1.00 mg/dL 1.22  1.28    Sodium 135 - 145 mmol/L 141  140    Potassium 3.5 - 5.1 mmol/L 4.2  4.1    Chloride 98 - 111 mmol/L 106  105    CO2 22 - 32 mmol/L 28  28    Calcium 8.9 - 10.3 mg/dL 9.4  9.9    Total Protein 6.5 - 8.1 g/dL 7.4  7.4  7.1   Total Bilirubin 0.3 - 1.2 mg/dL 0.4  0.5  0.5   Alkaline Phos 38 - 126 U/L 65  59  61   AST 15 - 41 U/L _0 ALT 0 - 44 U/L 19  15  15

## 2022-08-16 NOTE — Assessment & Plan Note (Addendum)
09/05/2019 DEXA  revealed osteoporosis with a T score of -2.9 at the lumbar spine.  09/27/2021 DEXA T score of -3.4 left femur, 10-year osteoporosis related fracture 20%. Continue calcium and vitamin D supplementation.  She is not able to tolerate Fosamax and declined Zometa and Prolia due to dental issue.

## 2022-08-16 NOTE — Assessment & Plan Note (Signed)
History of right breast DCIS status postlumpectomy in 2006. Recommend patient to continue annual screening mammogram.  She has 1 scheduled in January 2024.

## 2022-08-22 ENCOUNTER — Encounter: Payer: Self-pay | Admitting: *Deleted

## 2022-08-23 ENCOUNTER — Encounter: Payer: Self-pay | Admitting: *Deleted

## 2022-08-23 ENCOUNTER — Ambulatory Visit: Payer: Medicare Other | Admitting: Anesthesiology

## 2022-08-23 ENCOUNTER — Ambulatory Visit
Admission: RE | Admit: 2022-08-23 | Discharge: 2022-08-23 | Disposition: A | Discharge status: Home / Self Care | Payer: Medicare Other | Attending: Gastroenterology | Admitting: Gastroenterology

## 2022-08-23 ENCOUNTER — Encounter
Admission: RE | Disposition: A | Discharge status: Home / Self Care | Payer: Self-pay | Source: Home / Self Care | Attending: Gastroenterology

## 2022-08-23 DIAGNOSIS — Z79899 Other long term (current) drug therapy: Secondary | ICD-10-CM | POA: Diagnosis not present

## 2022-08-23 DIAGNOSIS — F419 Anxiety disorder, unspecified: Secondary | ICD-10-CM | POA: Diagnosis not present

## 2022-08-23 DIAGNOSIS — Z923 Personal history of irradiation: Secondary | ICD-10-CM | POA: Insufficient documentation

## 2022-08-23 DIAGNOSIS — Z87891 Personal history of nicotine dependence: Secondary | ICD-10-CM | POA: Diagnosis not present

## 2022-08-23 DIAGNOSIS — I129 Hypertensive chronic kidney disease with stage 1 through stage 4 chronic kidney disease, or unspecified chronic kidney disease: Secondary | ICD-10-CM | POA: Diagnosis not present

## 2022-08-23 DIAGNOSIS — Z853 Personal history of malignant neoplasm of breast: Secondary | ICD-10-CM | POA: Insufficient documentation

## 2022-08-23 DIAGNOSIS — N183 Chronic kidney disease, stage 3 unspecified: Secondary | ICD-10-CM | POA: Diagnosis not present

## 2022-08-23 DIAGNOSIS — M8580 Other specified disorders of bone density and structure, unspecified site: Secondary | ICD-10-CM | POA: Diagnosis not present

## 2022-08-23 DIAGNOSIS — K219 Gastro-esophageal reflux disease without esophagitis: Secondary | ICD-10-CM | POA: Diagnosis not present

## 2022-08-23 DIAGNOSIS — K222 Esophageal obstruction: Secondary | ICD-10-CM | POA: Insufficient documentation

## 2022-08-23 DIAGNOSIS — E785 Hyperlipidemia, unspecified: Secondary | ICD-10-CM | POA: Insufficient documentation

## 2022-08-23 DIAGNOSIS — R1310 Dysphagia, unspecified: Secondary | ICD-10-CM | POA: Diagnosis not present

## 2022-08-23 DIAGNOSIS — M1990 Unspecified osteoarthritis, unspecified site: Secondary | ICD-10-CM | POA: Insufficient documentation

## 2022-08-23 DIAGNOSIS — E039 Hypothyroidism, unspecified: Secondary | ICD-10-CM | POA: Insufficient documentation

## 2022-08-23 DIAGNOSIS — N189 Chronic kidney disease, unspecified: Secondary | ICD-10-CM | POA: Insufficient documentation

## 2022-08-23 HISTORY — PX: ESOPHAGOGASTRODUODENOSCOPY (EGD) WITH PROPOFOL: SHX5813

## 2022-08-23 SURGERY — ESOPHAGOGASTRODUODENOSCOPY (EGD) WITH PROPOFOL
Anesthesia: General

## 2022-08-23 MED ORDER — LIDOCAINE HCL (CARDIAC) PF 100 MG/5ML IV SOSY
PREFILLED_SYRINGE | INTRAVENOUS | Status: DC | PRN
  Administered 2022-08-23: 100 mg via INTRAVENOUS

## 2022-08-23 MED ORDER — PROPOFOL 10 MG/ML IV BOLUS
INTRAVENOUS | Status: DC | PRN
  Administered 2022-08-23: 120 mg via INTRAVENOUS
  Administered 2022-08-23 (×2): 30 mg via INTRAVENOUS

## 2022-08-23 MED ORDER — SODIUM CHLORIDE 0.9 % IV SOLN: INTRAVENOUS | Status: DC

## 2022-08-23 NOTE — Op Note (Signed)
[facility] Gastroenterology Patient Name: Lindsey Foster Procedure Date: 08/23/2022 8:09 AM MRN: [id number] Account #: [id number] Date of Birth: 03/22/1946 Admit Type: Outpatient Age: 77 Room: [facility] ENDO ROOM 1 Gender: Female Note Status: Finalized Instrument Name: Altamese Cabal Endoscope [id number] Procedure:             Upper GI endoscopy Indications:           Dysphagia Providers:             Andrey Farmer MD, MD Referring MD:          Andrey Farmer MD, MD (Referring MD) Medicines:             Monitored Anesthesia Care Complications:         No immediate complications. Estimated blood loss:                         Minimal. Procedure:             Pre-Anesthesia Assessment:                        - Prior to the procedure, a History and Physical was                         performed, and patient medications and allergies were                         reviewed. The patient is competent. The risks and                         benefits of the procedure and the sedation options and                         risks were discussed with the patient. All questions                         were answered and informed consent was obtained.                         Patient identification and proposed procedure were                         verified by the physician, the nurse, the                         anesthesiologist, the anesthetist and the technician                         in the endoscopy suite. Mental Status Examination:                         alert and oriented. Airway Examination: normal                         oropharyngeal airway and neck mobility. Respiratory                         Examination: clear to auscultation. CV Examination:  normal. Prophylactic Antibiotics: The patient does not                         require prophylactic antibiotics. Prior                         Anticoagulants: The patient has taken no anticoagulant                          or antiplatelet agents. ASA Grade Assessment: III - A                         patient with severe systemic disease. After reviewing                         the risks and benefits, the patient was deemed in                         satisfactory condition to undergo the procedure. The                         anesthesia plan was to use monitored anesthesia care                         (MAC). Immediately prior to administration of                         medications, the patient was re-assessed for adequacy                         to receive sedatives. The heart rate, respiratory                         rate, oxygen saturations, blood pressure, adequacy of                         pulmonary ventilation, and response to care were                         monitored throughout the procedure. The physical                         status of the patient was re-assessed after the                         procedure.                        After obtaining informed consent, the endoscope was                         passed under direct vision. Throughout the procedure,                         the patient's blood pressure, pulse, and oxygen                         saturations were monitored continuously. The Endoscope  was introduced through the mouth, and advanced to the                         second part of duodenum. The upper GI endoscopy was                         accomplished without difficulty. The patient tolerated                         the procedure well. Findings:      A non-obstructing Schatzki ring was found in the lower third of the       esophagus. A TTS dilator was passed through the scope. Dilation with a       15-16.5-18 mm balloon dilator was performed to 18 mm. The dilation site       was examined and showed moderate mucosal disruption. Estimated blood       loss was minimal.      The exam of the esophagus was otherwise normal.      The entire examined stomach  was normal.      The examined duodenum was normal. Impression:            - Non-obstructing Schatzki ring. Dilated.                        - Normal stomach.                        - Normal examined duodenum.                        - No specimens collected. Recommendation:        - Discharge patient to home.                        - Resume previous diet.                        - Use a proton pump inhibitor PO daily.                        - Return to referring physician as previously                         scheduled. Procedure Code(s):     --- Professional ---                        [phone], Esophagogastroduodenoscopy, flexible,                         transoral; with transendoscopic balloon dilation of                         esophagus (less than 30 mm diameter) Diagnosis Code(s):     --- Professional ---                        K22.2, Esophageal obstruction                        R13.10, Dysphagia, unspecified CPT copyright 2022 American Medical Association. All rights reserved.  The codes documented in this report are preliminary and upon coder review may  be revised to meet current compliance requirements. Andrey Farmer MD, MD 08/23/2022 8:36:59 AM Number of Addenda: 0 Note Initiated On: 08/23/2022 8:09 AM Estimated Blood Loss:  Estimated blood loss was minimal.      [facility]

## 2022-08-23 NOTE — H&P (Signed)
Outpatient short stay form Pre-procedure 08/23/2022  [name], MD  Primary Physician: [name], MD  Reason for visit:  Dysphagia  History of present illness:    77 y/o lady with history of hypertension here for EGD for dysphagia that occurred only one time. No blood thinners. No family history of GI malignancies. History of c-section.    Current Facility-Administered Medications:    0.9 %  sodium chloride infusion, , Intravenous, Continuous, Nazanin Kinner, Hilton Cork, MD, Last Rate: 20 mL/hr at 08/23/22 0817, Continued from Pre-op at 08/23/22 0817  Medications Prior to Admission  Medication Sig Dispense Refill Last Dose   amLODipine (NORVASC) 5 MG tablet Take 1 tablet (5 mg total) by mouth daily. 90 tablet 1 08/22/2022   Calcium Citrate-Vitamin D3 1000-400 LIQD Take 1 tablet by mouth daily. Reported on 02/04/2016   08/22/2022   fluticasone (FLONASE SENSIMIST) 27.5 MCG/SPRAY nasal spray Place 2 sprays into the nose daily. 10 g 2 01/15/2296   Garlic 989 MG TABS Take 1 tablet by mouth daily.    08/22/2022   clobetasol cream (TEMOVATE) 0.05 % Apply topically 2 (two) times daily. For 2 weeks, then just on week days as needed 60 g 1    clonazePAM (KLONOPIN) 0.5 MG tablet Take 0.5 tablets (0.25 mg total) by mouth once as needed for up to 1 dose for anxiety. Take prior to getting on the plane during travel 5 tablet 0    Crisaborole (EUCRISA) 2 % OINT Apply to affected skin once a day 60 g 2    famotidine (PEPCID) 20 MG tablet TAKE 1 TABLET(20 MG) BY MOUTH TWICE DAILY BEFORE A MEAL 180 tablet 1    ketotifen (ZADITOR) 0.025 % ophthalmic solution 1 drop 2 (two) times daily.      loratadine (CLARITIN) 10 MG tablet Take 1 tablet (10 mg total) by mouth daily. 30 tablet 11    mometasone (ELOCON) 0.1 % lotion Apply topically daily.        Allergies  Allergen Reactions   Latex Rash     Past Medical History:  Diagnosis Date   Allergic rhinitis    Anxiety    panic attacks in hot rooms    Arthritis    Blood in stool    Breast cancer ([place]) 2006   radiation - Rt   Chickenpox    CKD (chronic kidney disease)    Colon polyps    Diverticulosis    GERD (gastroesophageal reflux disease)    Heart murmur    Hyperlipidemia    Hypertension    Hypothyroidism    Multiple thyroid nodules    Osteopenia    Personal history of radiation therapy    Urinary incontinence    UTI (lower urinary tract infection)    Vitamin D deficiency disease     Review of systems:  Otherwise negative.    Physical Exam  Gen: Alert, oriented. Appears stated age.  HEENT:  PERRLA. Lungs: No respiratory distress CV: RRR Abd: soft, benign, no masses Ext: No edema    Planned procedures: Proceed with EGD. The patient understands the nature of the planned procedure, indications, risks, alternatives and potential complications including but not limited to bleeding, infection, perforation, damage to internal organs and possible oversedation/side effects from anesthesia. The patient agrees and gives consent to proceed.  Please refer to procedure notes for findings, recommendations and patient disposition/instructions.     [name], MD [facility]astroenterology

## 2022-08-23 NOTE — Anesthesia Preprocedure Evaluation (Signed)
Anesthesia Evaluation  Patient identified by MRN, date of birth, ID band Patient awake    Reviewed: Allergy & Precautions, NPO status , Patient's Chart, lab work & pertinent test results  History of Anesthesia Complications Negative for: history of anesthetic complications  Airway Mallampati: III  TM Distance: <3 FB Neck ROM: full    Dental  (+) Chipped, Poor Dentition, Missing   Pulmonary neg shortness of breath, former smoker   Pulmonary exam normal        Cardiovascular Exercise Tolerance: Good hypertension, (-) angina Normal cardiovascular exam+ Valvular Problems/Murmurs      Neuro/Psych  Headaches PSYCHIATRIC DISORDERS         GI/Hepatic Neg liver ROS,GERD  Controlled,,  Endo/Other  Hypothyroidism    Renal/GU Renal disease  negative genitourinary   Musculoskeletal   Abdominal   Peds  Hematology negative hematology ROS (+)   Anesthesia Other Findings Patient reports that they do not think that any food or pills are stuck in their throat at this time.  Past Medical History: No date: Allergic rhinitis No date: Anxiety     Comment:  panic attacks in hot rooms No date: Arthritis No date: Blood in stool 2006: Breast cancer ([place])     Comment:  radiation - Rt No date: Chickenpox No date: CKD (chronic kidney disease) No date: Colon polyps No date: Diverticulosis No date: GERD (gastroesophageal reflux disease) No date: Heart murmur No date: Hyperlipidemia No date: Hypertension No date: Hypothyroidism No date: Multiple thyroid nodules No date: Osteopenia No date: Personal history of radiation therapy No date: Urinary incontinence No date: UTI (lower urinary tract infection) No date: Vitamin D deficiency disease  Past Surgical History: No date: BLADDER SURGERY 2006: BREAST EXCISIONAL BIOPSY; Right     Comment:  positive 2006: BREAST LUMPECTOMY     Comment:  DCIS 1985: CESAREAN SECTION 04/30/2021:  COLONOSCOPY; N/A     Comment:  Procedure: COLONOSCOPY;  Surgeon: [name],               MD;  Location: [facility] ENDOSCOPY;  Service: Endoscopy;                Laterality: N/A; 09/28/2015: COLONOSCOPY WITH PROPOFOL; N/A     Comment:  Procedure: COLONOSCOPY WITH PROPOFOL;  Surgeon: Lollie Sails, MD;  [facility] ENDOSCOPY;  Service:               Endoscopy;  Laterality: N/A; 2012: [place] OF UTERUS 2003: Thyroid nodule ablation 2009 : Uterine polyp removal  BMI    Body Mass Index: 28.35 kg/m      Reproductive/Obstetrics negative OB ROS                             Anesthesia Physical Anesthesia Plan  ASA: 3  Anesthesia Plan: General   Post-op Pain Management:    Induction: Intravenous  PONV Risk Score and Plan: Propofol infusion and TIVA  Airway Management Planned: Natural Airway and Nasal Cannula  Additional Equipment:   Intra-op Plan:   Post-operative Plan:   Informed Consent: I have reviewed the patients History and Physical, chart, labs and discussed the procedure including the risks, benefits and alternatives for the proposed anesthesia with the patient or authorized representative who has indicated his/her understanding and acceptance.     Dental Advisory Given  Plan Discussed with: Anesthesiologist, CRNA and  Surgeon  Anesthesia Plan Comments: (Patient consented for risks of anesthesia including but not limited to:  - adverse reactions to medications - risk of airway placement if required - damage to eyes, teeth, lips or other oral mucosa - nerve damage due to positioning  - sore throat or hoarseness - Damage to heart, brain, nerves, lungs, other parts of body or loss of life  Patient voiced understanding.)       Anesthesia Quick Evaluation

## 2022-08-23 NOTE — Anesthesia Postprocedure Evaluation (Signed)
Anesthesia Post Note  Patient: Judye Lorino  Procedure(s) Performed: ESOPHAGOGASTRODUODENOSCOPY (EGD) WITH PROPOFOL  Patient location during evaluation: Endoscopy Anesthesia Type: General Level of consciousness: awake and alert Pain management: pain level controlled Vital Signs Assessment: post-procedure vital signs reviewed and stable Respiratory status: spontaneous breathing, nonlabored ventilation, respiratory function stable and patient connected to nasal cannula oxygen Cardiovascular status: blood pressure returned to baseline and stable Postop Assessment: no apparent nausea or vomiting Anesthetic complications: no   No notable events documented.   Last Vitals:  Vitals:   08/23/22 0846 08/23/22 0856  BP: (!) 147/74 (!) 165/77  Pulse: 69 61  Resp: 15 19  Temp:  (!) 35.7 C  SpO2: 100% 99%    Last Pain:  Vitals:   08/23/22 0856  TempSrc: Temporal  PainSc:                  Precious Haws Zenia Guest

## 2022-08-23 NOTE — Transfer of Care (Signed)
Immediate Anesthesia Transfer of Care Note  Patient: Lindsey Foster  Procedure(s) Performed: ESOPHAGOGASTRODUODENOSCOPY (EGD) WITH PROPOFOL  Patient Location: Endoscopy Unit  Anesthesia Type:General  Level of Consciousness: drowsy  Airway & Oxygen Therapy: Patient Spontanous Breathing and Patient connected to nasal cannula oxygen  Post-op Assessment: Report given to RN, Post -op Vital signs reviewed and stable, and Patient moving all extremities  Post vital signs: Reviewed and stable  Last Vitals:  Vitals Value Taken Time  BP 153/61 08/23/22 0836  Temp    Pulse 70 08/23/22 0836  Resp 18 08/23/22 0836  SpO2 96 % 08/23/22 0836    Last Pain:  Vitals:   08/23/22 0745  TempSrc: Temporal  PainSc: 0-No pain         Complications: No notable events documented.

## 2022-08-23 NOTE — Interval H&P Note (Signed)
History and Physical Interval Note:  08/23/2022 8:21 AM  Lindsey Foster  has presented today for surgery, with the diagnosis of ESOPHAGEAL DYSPHAGIA.  The various methods of treatment have been discussed with the patient and family. After consideration of risks, benefits and other options for treatment, the patient has consented to  Procedure(s): ESOPHAGOGASTRODUODENOSCOPY (EGD) WITH PROPOFOL (N/A) as a surgical intervention.  The patient's history has been reviewed, patient examined, no change in status, stable for surgery.  I have reviewed the patient's chart and labs.  Questions were answered to the patient's satisfaction.     [name]  Ok to proceed with EGD

## 2022-08-24 ENCOUNTER — Encounter: Payer: Self-pay | Admitting: Gastroenterology

## 2022-08-30 ENCOUNTER — Encounter: Payer: Self-pay | Admitting: Family Medicine

## 2022-08-30 ENCOUNTER — Ambulatory Visit (INDEPENDENT_AMBULATORY_CARE_PROVIDER_SITE_OTHER): Payer: Medicare Other | Admitting: Family Medicine

## 2022-08-30 VITALS — BP 130/70 | HR 75 | Temp 98.8°F | Ht 67.0 in | Wt 180.4 lb

## 2022-08-30 DIAGNOSIS — E785 Hyperlipidemia, unspecified: Secondary | ICD-10-CM | POA: Diagnosis not present

## 2022-08-30 DIAGNOSIS — Z8639 Personal history of other endocrine, nutritional and metabolic disease: Secondary | ICD-10-CM | POA: Diagnosis not present

## 2022-08-30 DIAGNOSIS — R252 Cramp and spasm: Secondary | ICD-10-CM | POA: Diagnosis not present

## 2022-08-30 DIAGNOSIS — M25512 Pain in left shoulder: Secondary | ICD-10-CM

## 2022-08-30 DIAGNOSIS — I10 Essential (primary) hypertension: Secondary | ICD-10-CM | POA: Diagnosis not present

## 2022-08-30 DIAGNOSIS — K219 Gastro-esophageal reflux disease without esophagitis: Secondary | ICD-10-CM | POA: Diagnosis not present

## 2022-08-30 LAB — T4, FREE: Free T4: 0.86 ng/dL (ref 0.60–1.60)

## 2022-08-30 LAB — LIPID PANEL
Cholesterol: 217 mg/dL — ABNORMAL HIGH (ref 0–200)
HDL: 77.2 mg/dL (ref >39.00)
LDL Cholesterol: 127 mg/dL — ABNORMAL HIGH (ref 0–99)
NonHDL: 139.77
Total CHOL/HDL Ratio: 3
Triglycerides: 64 mg/dL (ref 0.0–149.0)
VLDL: 12.8 mg/dL (ref 0.0–40.0)

## 2022-08-30 LAB — MAGNESIUM: Magnesium: 1.9 mg/dL (ref 1.5–2.5)

## 2022-08-30 LAB — TSH: TSH: 1.73 u[IU]/mL (ref 0.35–5.50)

## 2022-08-30 NOTE — Assessment & Plan Note (Signed)
Recheck thyroid labs.

## 2022-08-30 NOTE — Assessment & Plan Note (Addendum)
Chronic issue.  Discussed that the most optimal treatment for her reflux would be a PPI though there is some risk of poor absorption of minerals that can contribute to bone density issues.  Discussed the risk of poorly treated reflux.  The patient does not want to be on a PPI and thus I advised that she take her Pepcid 20 mg by mouth twice daily with meals.

## 2022-08-30 NOTE — Progress Notes (Signed)
[name], MD Phone: [phone]  Lindsey Foster is a 77 y.o. female who presents today for follow-up.  Hypertension: Patient notes blood pressures are similar to today.  She is taking amlodipine.  No chest pain, shortness of breath, or edema.  GERD: Patient had an EGD.  She had a nonobstructive Schatzki ring that was dilated.  They wanted her to start on Protonix at the patient is hesitant to start on a PPI given the risk to her bones.  She occasionally has reflux and notes Pepcid helps when she does take it.  Muscle cramps: Patient reports cramps in her lower legs that been going on for quite some time.  They occur most nights.  Massage has been helpful.  She drinks 48 ounces of water daily though also drinks decaf sweet tea and fruit juice.  Left shoulder pain: Patient notes this hurts in her deltoid muscle where she gets her vaccine injections.  She notes she feels discomfort with internal rotation and abduction of the shoulder.  No specific injury.  Social History   Tobacco Use  Smoking Status Former  Smokeless Tobacco Never    Current Outpatient Medications on File Prior to Visit  Medication Sig Dispense Refill   amLODipine (NORVASC) 5 MG tablet Take 1 tablet (5 mg total) by mouth daily. 90 tablet 1   Calcium Citrate-Vitamin D3 1000-400 LIQD Take 1 tablet by mouth daily. Reported on 02/04/2016     clobetasol cream (TEMOVATE) 0.05 % Apply topically 2 (two) times daily. For 2 weeks, then just on week days as needed 60 g 1   clonazePAM (KLONOPIN) 0.5 MG tablet Take 0.5 tablets (0.25 mg total) by mouth once as needed for up to 1 dose for anxiety. Take prior to getting on the plane during travel 5 tablet 0   Crisaborole (EUCRISA) 2 % OINT Apply to affected skin once a day 60 g 2   famotidine (PEPCID) 20 MG tablet TAKE 1 TABLET(20 MG) BY MOUTH TWICE DAILY BEFORE A MEAL 180 tablet 1   fluticasone (FLONASE SENSIMIST) 27.5 MCG/SPRAY nasal spray Place 2 sprays into the nose  daily. 10 g 2   Garlic 098 MG TABS Take 1 tablet by mouth daily.      ketotifen (ZADITOR) 0.025 % ophthalmic solution 1 drop 2 (two) times daily.     loratadine (CLARITIN) 10 MG tablet Take 1 tablet (10 mg total) by mouth daily. 30 tablet 11   mometasone (ELOCON) 0.1 % lotion Apply topically daily.     No current facility-administered medications on file prior to visit.     ROS see history of present illness  Objective  Physical Exam Vitals:   08/30/22 1044  BP: 130/70  Pulse: 75  Temp: 98.8 F (37.1 C)  SpO2: 96%    BP Readings from Last 3 Encounters:  08/30/22 130/70  08/23/22 (!) 165/77  08/16/22 (!) 142/72   Wt Readings from Last 3 Encounters:  08/30/22 180 lb 6.4 oz (81.8 kg)  08/23/22 181 lb (82.1 kg)  08/16/22 182 lb 1.6 oz (82.6 kg)    Physical Exam Constitutional:      General: She is not in acute distress.    Appearance: She is not diaphoretic.  Cardiovascular:     Rate and Rhythm: Normal rate and regular rhythm.     Heart sounds: Normal heart sounds.  Pulmonary:     Effort: Pulmonary effort is normal.     Breath sounds: Normal breath sounds.  Musculoskeletal:  Comments: Right shoulder with no tenderness, right shoulder with full active range of motion with no discomfort, left shoulder with tenderness over the deltoid, no other tenderness in the left shoulder, full active and passive range of motion of the left shoulder, discomfort on active and passive range of motion on internal rotation and abduction of the left shoulder, positive empty can on the left, positive speeds on the left  Skin:    General: Skin is warm and dry.  Neurological:     Mental Status: She is alert.      Assessment/Plan: Please see individual problem list.  Essential hypertension Assessment & Plan: Chronic issue.  Adequate control for her age.  She will continue amlodipine 5 mg once daily.   Gastroesophageal reflux disease, unspecified whether esophagitis  present Assessment & Plan: Chronic issue.  Discussed that the most optimal treatment for her reflux would be a PPI though there is some risk of poor absorption of minerals that can contribute to bone density issues.  Discussed the risk of poorly treated reflux.  The patient does not want to be on a PPI and thus I advised that she take her Pepcid 20 mg by mouth twice daily with meals.   Hyperlipidemia, unspecified hyperlipidemia type Assessment & Plan: Check lipid panel at patient request.  Orders: -     Lipid panel  History of thyroid nodule Assessment & Plan: Recheck thyroid labs.  Orders: -     TSH -     T4, free -     T3  Muscle cramp -     Magnesium  Muscle cramping Assessment & Plan: Chronic issue.  Check magnesium level.  Encouraged increased hydration and stretching.   Left shoulder pain, unspecified chronicity Assessment & Plan: Possibly related to rotator cuff impingement.  She will be given exercises to complete at home.     Return in about 3 months (around 11/29/2022).   [name], MD [place]

## 2022-08-30 NOTE — Assessment & Plan Note (Signed)
Possibly related to rotator cuff impingement.  She will be given exercises to complete at home.

## 2022-08-30 NOTE — Assessment & Plan Note (Signed)
Chronic issue.  Check magnesium level.  Encouraged increased hydration and stretching.

## 2022-08-30 NOTE — Assessment & Plan Note (Signed)
Check lipid panel at patient request.

## 2022-08-30 NOTE — Assessment & Plan Note (Addendum)
Chronic issue.  Adequate control for her age.  She will continue amlodipine 5 mg once daily.

## 2022-08-30 NOTE — Progress Notes (Signed)
AVS mailed to patients home today.  Jodye Scali,cma

## 2022-08-30 NOTE — Patient Instructions (Signed)
Shoulder Impingement Syndrome Rehab Ask your health care provider which exercises are safe for you. Do exercises exactly as told by your health care provider and adjust them as directed. It is normal to feel mild stretching, pulling, tightness, or discomfort as you do these exercises. Stop right away if you feel sudden pain or your pain gets worse. Do not begin these exercises until told by your health care provider. Stretching and range-of-motion exercise This exercise warms up your muscles and joints and improves the movement and flexibility of your shoulder. This exercise also helps to relieve pain and stiffness. Passive horizontal adduction In passive adduction, you use your other hand to move the injured arm toward your body. The injured arm does not move on its own. In this movement, your arm is moved across your body in the horizontal plane (horizontal adduction). Sit or stand and pull your left / right elbow across your chest, toward your other shoulder. Stop when you feel a gentle stretch in the back of your shoulder and upper arm. Keep your arm at shoulder height. Keep your arm as close to your body as you comfortably can. Hold for __________ seconds. Slowly return to the starting position. Repeat __________ times. Complete this exercise __________ times a day. Strengthening exercises These exercises build strength and endurance in your shoulder. Endurance is the ability to use your muscles for a long time, even after they get tired. External rotation, isometric This is an exercise in which you press the back of your wrist against a door frame without moving your shoulder joint (isometric). Stand or sit in a doorway, facing the door frame. Bend your left / right elbow and place the back of your wrist against the door frame. Only the back of your wrist should be touching the frame. Keep your upper arm at your side. Gently press your wrist against the door frame, as if you are trying to  push your arm away from your abdomen (external rotation). Press as hard as you are able without pain. Avoid shrugging your shoulder while you press your wrist against the door frame. Keep your shoulder blade tucked down toward the middle of your back. Hold for __________ seconds. Slowly release the tension, and relax your muscles completely before you repeat the exercise. Repeat __________ times. Complete this exercise __________ times a day. Internal rotation, isometric This is an exercise in which you press your palm against a door frame without moving your shoulder joint (isometric). Stand or sit in a doorway, facing the door frame. Bend your left / right elbow and place the palm of your hand against the door frame. Only your palm should be touching the frame. Keep your upper arm at your side. Gently press your hand against the door frame, as if you are trying to push your arm toward your abdomen (internal rotation). Press as hard as you are able without pain. Avoid shrugging your shoulder while you press your hand against the door frame. Keep your shoulder blade tucked down toward the middle of your back. Hold for __________ seconds. Slowly release the tension, and relax your muscles completely before you repeat the exercise. Repeat __________ times. Complete this exercise __________ times a day. Scapular protraction, supine  Lie on your back on a firm surface (supine position). Hold a __________ weight in your left / right hand. Raise your left / right arm straight into the air so your hand is directly above your shoulder joint. Push the weight into the air so   your shoulder (scapula) lifts off the surface that you are lying on. The scapula will push up or forward (protraction). Do not move your head, neck, or back. Hold for __________ seconds. Slowly return to the starting position. Let your muscles relax completely before you repeat this exercise. Repeat __________ times. Complete this  exercise __________ times a day. Scapular retraction  Sit in a stable chair without armrests, or stand up. Secure an exercise band to a stable object in front of you so the band is at shoulder height. Hold one end of the exercise band in each hand. Your palms should face down. Squeeze your shoulder blades together (retraction) and move your elbows slightly behind you. Do not shrug your shoulders upward while you do this. Hold for __________ seconds. Slowly return to the starting position. Repeat __________ times. Complete this exercise __________ times a day. Shoulder extension  Sit in a stable chair without armrests, or stand up. Secure an exercise band to a stable object in front of you so the band is above shoulder height. Hold one end of the exercise band in each hand. Straighten your elbows and lift your hands up to shoulder height. Squeeze your shoulder blades together and pull your hands down to the sides of your thighs (extension). Stop when your hands are straight down by your sides. Do not let your hands go behind your body. Hold for __________ seconds. Slowly return to the starting position. Repeat __________ times. Complete this exercise __________ times a day. This information is not intended to replace advice given to you by your health care provider. Make sure you discuss any questions you have with your health care provider. Document Revised: 11/23/2018 Document Reviewed: 08/27/2018 Elsevier Patient Education  2023 Elsevier Inc.  

## 2022-08-31 LAB — T3: T3, Total: 104 ng/dL (ref 76–181)

## 2022-09-02 ENCOUNTER — Ambulatory Visit
Admission: RE | Admit: 2022-09-02 | Discharge: 2022-09-02 | Disposition: A | Discharge status: Home / Self Care | Payer: Medicare Other | Source: Ambulatory Visit | Attending: Oncology | Admitting: Oncology

## 2022-09-02 DIAGNOSIS — Z1231 Encounter for screening mammogram for malignant neoplasm of breast: Secondary | ICD-10-CM

## 2022-10-05 DIAGNOSIS — N1832 Chronic kidney disease, stage 3b: Secondary | ICD-10-CM | POA: Diagnosis not present

## 2022-10-11 DIAGNOSIS — N1831 Chronic kidney disease, stage 3a: Secondary | ICD-10-CM | POA: Diagnosis not present

## 2022-10-11 DIAGNOSIS — I10 Essential (primary) hypertension: Secondary | ICD-10-CM | POA: Diagnosis not present

## 2022-10-11 DIAGNOSIS — N2581 Secondary hyperparathyroidism of renal origin: Secondary | ICD-10-CM | POA: Diagnosis not present

## 2022-10-12 NOTE — Progress Notes (Unsigned)
Lindsey Foster [place] Phone: [phone] Subjective:   Lindsey Foster, am serving as a scribe for Dr. Hulan Saas.  I'm seeing this patient by the request  of:  Lindsey [place], MD  CC: Left leg pain, knee pain  [id number]  07/28/2022 Severe arthritic changes mostly of the medial and lateral compartment.  Discussed with patient again at great length and patient wants to avoid any surgical intervention.  Discussed with patient icing regimen and home exercises otherwise.  Discussed with the possibility of viscosupplementation.  Chronic problem with worsening symptoms.  Follow-up again in 6 to 8 weeks.     Update 10/13/2022 Lindsey Foster is a 77 y.o. female coming in with complaint of L knee pain. Patient states that she had 2 months without pain. Pain started to come back and then it went away. Foster pain today. Stiffness is her biggest complaint regarding her knee.   Also c/o L shoulder pain. Patient states that she has pain with flexion over superior and posterior aspect of shoulder. Pain at one point went up into her neck. Also had some pain at [facility] when she got her covid injections. Denies any radiating symptoms.        Past Medical History:  Diagnosis Date   Allergic rhinitis    Anxiety    panic attacks in hot rooms   Arthritis    Blood in stool    Breast cancer ([place]) 2006   radiation - Rt   Chickenpox    CKD (chronic kidney disease)    Colon polyps    Diverticulosis    GERD (gastroesophageal reflux disease)    Heart murmur    Hyperlipidemia    Hypertension    Hypothyroidism    Multiple thyroid nodules    Osteopenia    Personal history of radiation therapy    Urinary incontinence    UTI (lower urinary tract infection)    Vitamin D deficiency disease    Past Surgical History:  Procedure Laterality Date   BLADDER SURGERY     BREAST EXCISIONAL BIOPSY Right 2006   positive   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   CESAREAN SECTION  1985   COLONOSCOPY N/A 04/30/2021   Procedure: COLONOSCOPY;  Surgeon: [name], MD;  Location: [facility] ENDOSCOPY;  Service: Endoscopy;  Laterality: N/A;   COLONOSCOPY WITH PROPOFOL N/A 09/28/2015   Procedure: COLONOSCOPY WITH PROPOFOL;  Surgeon: Lollie Sails, MD;  Location: [facility] ENDOSCOPY;  Service: Endoscopy;  Laterality: N/A;   [place] OF UTERUS  2012   ESOPHAGOGASTRODUODENOSCOPY (EGD) WITH PROPOFOL N/A 08/23/2022   Procedure: ESOPHAGOGASTRODUODENOSCOPY (EGD) WITH PROPOFOL;  Surgeon: [name], MD;  Location: [facility] ENDOSCOPY;  Service: Endoscopy;  Laterality: N/A;   Thyroid nodule ablation  2003   Uterine polyp removal  2009    Social History   Socioeconomic History   Marital status: Widowed    Spouse name: Not on file   Number of children: Not on file   Years of education: Not on file   Highest education level: Not on file  Occupational History   Not on file  Tobacco Use   Smoking status: Former   Smokeless tobacco: Never  Vaping Use   Vaping Use: Never used  Substance and Sexual Activity   Alcohol use: Foster    Alcohol/week: 0.0 standard drinks of alcohol   Drug use: Foster   Sexual activity: Not on file  Other Topics Concern   Not on file  Social History Narrative   Not on file   Social Determinants of Health   Financial Resource Strain: Low Risk  (03/11/2022)   Overall Financial Resource Strain (CARDIA)    Difficulty of Paying Living Expenses: Not hard at all  Food Insecurity: Foster Food Insecurity (03/11/2022)   Hunger Vital Sign    Worried About Running Out of Food in the Last Year: Never true    Ran Out of Food in the Last Year: Never true  Transportation Needs: Foster Transportation Needs (03/11/2022)   PRAPARE - Hydrologist (Medical): Foster    Lack of Transportation (Non-Medical): Foster  Physical Activity: Insufficiently Active (03/11/2022)   Exercise Vital Sign    Days of  Exercise per Week: 2 days    Minutes of Exercise per Session: 30 min  Stress: Foster Stress Concern Present (03/11/2022)   [place]    Feeling of Stress : Not at all  Social Connections: Unknown (03/11/2022)   Social Connection and Isolation Panel [NHANES]    Frequency of Communication with Friends and Family: More than three times a week    Frequency of Social Gatherings with Friends and Family: Not on file    Attends Religious Services: Not on file    Active Member of Clubs or Organizations: Not on file    Attends Archivist Meetings: Not on file    Marital Status: Not on file   Allergies  Allergen Reactions   Latex Rash   Family History  Problem Relation Age of Onset   Angina Father    Heart attack Father    Multiple myeloma Brother    Uterine cancer Maternal Aunt    Heart disease Other        Parent   Breast cancer Neg Hx      Current Outpatient Medications (Cardiovascular):    amLODipine (NORVASC) 5 MG tablet, Take 1 tablet (5 mg total) by mouth daily.  Current Outpatient Medications (Respiratory):    fluticasone (FLONASE SENSIMIST) 27.5 MCG/SPRAY nasal spray, Place 2 sprays into the nose daily.   loratadine (CLARITIN) 10 MG tablet, Take 1 tablet (10 mg total) by mouth daily.    Current Outpatient Medications (Other):    Calcium Citrate-Vitamin D3 1000-400 LIQD, Take 1 tablet by mouth daily. Reported on 02/04/2016   clobetasol cream (TEMOVATE) 0.05 %, Apply topically 2 (two) times daily. For 2 weeks, then just on week days as needed   clonazePAM (KLONOPIN) 0.5 MG tablet, Take 0.5 tablets (0.25 mg total) by mouth once as needed for up to 1 dose for anxiety. Take prior to getting on the plane during travel   Crisaborole (EUCRISA) 2 % OINT, Apply to affected skin once a day   famotidine (PEPCID) 20 MG tablet, TAKE 1 TABLET(20 MG) BY MOUTH TWICE DAILY BEFORE A MEAL   Garlic [id number] MG TABS, Take 1 tablet  by mouth daily.    ketotifen (ZADITOR) 0.025 % ophthalmic solution, 1 drop 2 (two) times daily.   mometasone (ELOCON) 0.1 % lotion, Apply topically daily.   Reviewed prior external information including notes and imaging from  primary care provider As well as notes that were available from care everywhere and other healthcare systems.  Past medical history, social, surgical and family history all reviewed in electronic medical record.  Foster pertanent information unless stated regarding to the chief complaint.   Review of Systems:  Foster headache, visual changes, nausea, vomiting, diarrhea, constipation, dizziness, abdominal pain, skin rash, fevers, chills, night sweats, weight loss, swollen lymph nodes, body aches, joint swelling, chest pain, shortness of breath, mood changes. POSITIVE muscle aches  Objective  Blood pressure 120/72, pulse 67, height '5\' 7"'$  (1.702 m), weight 183 lb (83 kg), SpO2 97 %.   General: Foster apparent distress alert and oriented x3 mood and affect normal, dressed appropriately.  HEENT: Pupils equal, extraocular movements intact  Respiratory: Patient's speak in full sentences and does not appear short of breath  Cardiovascular: Foster lower extremity edema, non tender, Foster erythema  Antalgic gait still noted.  Patient will have some swelling in the knees.  Does have some crepitus though still noted. Left shoulder does have more tightness in the paraspinal musculature.  Left shoulder has some mild impingement noted but seems to be more secondary to movement and feeling internal derangement of the shoulder. 5/5 strength of the rotator cuff  [id number]; 15 additional minutes spent for Therapeutic exercises as stated in above notes.  This included exercises focusing on stretching, strengthening, with significant focus on eccentric aspects.   Long term goals include an improvement in range of motion, strength, endurance as well as avoiding reinjury. Patient's frequency would include in 1-2  times a day, 3-5 times a week for a duration of 6-12 weeks. Shoulder Exercises that included:  Basic scapular stabilization to include adduction and depression of scapula Scaption, focusing on proper movement and good control Internal and External rotation utilizing a theraband, with elbow tucked at side entire time Rows with theraband   Proper technique shown and discussed handout in great detail with ATC.  All questions were discussed and answered.      Impression and Recommendations:

## 2022-10-13 ENCOUNTER — Encounter: Payer: Self-pay | Admitting: Family Medicine

## 2022-10-13 ENCOUNTER — Ambulatory Visit (INDEPENDENT_AMBULATORY_CARE_PROVIDER_SITE_OTHER): Payer: Medicare Other | Admitting: Family Medicine

## 2022-10-13 ENCOUNTER — Ambulatory Visit (INDEPENDENT_AMBULATORY_CARE_PROVIDER_SITE_OTHER): Payer: Medicare Other

## 2022-10-13 VITALS — BP 120/72 | HR 67 | Ht 67.0 in | Wt 183.0 lb

## 2022-10-13 DIAGNOSIS — M25512 Pain in left shoulder: Secondary | ICD-10-CM

## 2022-10-13 DIAGNOSIS — G8929 Other chronic pain: Secondary | ICD-10-CM

## 2022-10-13 DIAGNOSIS — M542 Cervicalgia: Secondary | ICD-10-CM | POA: Diagnosis not present

## 2022-10-13 DIAGNOSIS — M19012 Primary osteoarthritis, left shoulder: Secondary | ICD-10-CM | POA: Diagnosis not present

## 2022-10-13 DIAGNOSIS — M1712 Unilateral primary osteoarthritis, left knee: Secondary | ICD-10-CM

## 2022-10-13 NOTE — Assessment & Plan Note (Signed)
Feels like she is is doing well with the knees.  We discussed the details of the exercises, icing regimen, which activities to do and which ones to avoid.  I think we can hold on the viscosupplementation and we will consider beginning the injection if needed.  Continue patient to be active.  Follow-up again in 6 to 8 weeks if necessary otherwise 3 months

## 2022-10-13 NOTE — Patient Instructions (Signed)
Scapular exercises Everything else looks good Xray today Gel approval  See me again in 3 months

## 2022-10-13 NOTE — Assessment & Plan Note (Signed)
Multifactorial, seems to be more muscle imbalance, discussed posture and ergonomics throughout the day, increase activity slowly.  Follow-up with me again in 6 to 8 weeks if needed.  Could consider trigger point injections if needed.  Hopefully patient will do well with this and the home exercises.

## 2022-10-26 ENCOUNTER — Other Ambulatory Visit: Payer: Self-pay

## 2022-10-26 MED ORDER — COVID-19 MRNA VAC-TRIS(PFIZER) 30 MCG/0.3ML IM SUSY
0.3000 mL | PREFILLED_SYRINGE | Freq: Once | INTRAMUSCULAR | 0 refills | Status: AC
  Filled 2022-10-26 – 2022-12-29 (×2): qty 0.3, 1d supply, fill #0

## 2022-11-02 ENCOUNTER — Ambulatory Visit (INDEPENDENT_AMBULATORY_CARE_PROVIDER_SITE_OTHER): Payer: Medicare Other | Admitting: Dermatology

## 2022-11-02 ENCOUNTER — Encounter: Payer: Self-pay | Admitting: Dermatology

## 2022-11-02 VITALS — BP 143/81 | HR 63

## 2022-11-02 DIAGNOSIS — D2239 Melanocytic nevi of other parts of face: Secondary | ICD-10-CM | POA: Diagnosis not present

## 2022-11-02 DIAGNOSIS — L821 Other seborrheic keratosis: Secondary | ICD-10-CM

## 2022-11-02 DIAGNOSIS — D229 Melanocytic nevi, unspecified: Secondary | ICD-10-CM

## 2022-11-02 DIAGNOSIS — L738 Other specified follicular disorders: Secondary | ICD-10-CM

## 2022-11-02 DIAGNOSIS — L720 Epidermal cyst: Secondary | ICD-10-CM | POA: Diagnosis not present

## 2022-11-02 NOTE — Patient Instructions (Addendum)
Seborrheic Keratosis  What causes seborrheic keratoses? Seborrheic keratoses are harmless, common skin growths that first appear during adult life.  As time goes by, more growths appear.  Some people may develop a large number of them.  Seborrheic keratoses appear on both covered and uncovered body parts.  They are not caused by sunlight.  The tendency to develop seborrheic keratoses can be inherited.  They vary in color from skin-colored to gray, brown, or even black.  They can be either smooth or have a rough, warty surface.   Seborrheic keratoses are superficial and look as if they were stuck on the skin.  Under the microscope this type of keratosis looks like layers upon layers of skin.  That is why at times the top layer may seem to fall off, but the rest of the growth remains and re-grows.    Treatment Seborrheic keratoses do not need to be treated, but can easily be removed in the office.  Seborrheic keratoses often cause symptoms when they rub on clothing or jewelry.  Lesions can be in the way of shaving.  If they become inflamed, they can cause itching, soreness, or burning.  Removal of a seborrheic keratosis can be accomplished by freezing, burning, or surgery. If any spot bleeds, scabs, or grows rapidly, please return to have it checked, as these can be an indication of a skin cancer.  Due to recent changes in healthcare laws, you may see results of your pathology and/or laboratory studies on MyChart before the doctors have had a chance to review them. We understand that in some cases there may be results that are confusing or concerning to you. Please understand that not all results are received at the same time and often the doctors may need to interpret multiple results in order to provide you with the best plan of care or course of treatment. Therefore, we ask that you please give us 2 business days to thoroughly review all your results before contacting the office for clarification. Should  we see a critical lab result, you will be contacted sooner.   If You Need Anything After Your Visit  If you have any questions or concerns for your doctor, please call our main line at [phone] and press option 4 to reach your doctor's medical assistant. If no one answers, please leave a voicemail as directed and we will return your call as soon as possible. Messages left after 4 pm will be answered the following business day.   You may also send us a message via MyChart. We typically respond to MyChart messages within 1-2 business days.  For prescription refills, please ask your pharmacy to contact our office. Our fax number is [phone].  If you have an urgent issue when the clinic is closed that cannot wait until the next business day, you can page your doctor at the number below.    Please note that while we do our best to be available for urgent issues outside of office hours, we are not available 24/7.   If you have an urgent issue and are unable to reach us, you may choose to seek medical care at your doctor's office, retail clinic, urgent care center, or emergency room.  If you have a medical emergency, please immediately call 911 or go to the emergency department.  Pager Numbers  - Dr. Kowalski: [phone]  - Dr. Moye: [phone]  - Dr. Stewart: [phone]  In the event of inclement weather, please call our main line at   [phone] for an update on the status of any delays or closures.  Dermatology Medication Tips: Please keep the boxes that topical medications come in in order to help keep track of the instructions about where and how to use these. Pharmacies typically print the medication instructions only on the boxes and not directly on the medication tubes.   If your medication is too expensive, please contact our office at [phone] option 4 or send us a message through MyChart.   We are unable to tell what your co-pay for medications will be in  advance as this is different depending on your insurance coverage. However, we may be able to find a substitute medication at lower cost or fill out paperwork to get insurance to cover a needed medication.   If a prior authorization is required to get your medication covered by your insurance company, please allow us 1-2 business days to complete this process.  Drug prices often vary depending on where the prescription is filled and some pharmacies may offer cheaper prices.  The website www.goodrx.com contains coupons for medications through different pharmacies. The prices here do not account for what the cost may be with help from insurance (it may be cheaper with your insurance), but the website can give you the price if you did not use any insurance.  - You can print the associated coupon and take it with your prescription to the pharmacy.  - You may also stop by our office during regular business hours and pick up a GoodRx coupon card.  - If you need your prescription sent electronically to a different pharmacy, notify our office through [facility] MyChart or by phone at [phone] option 4.     Si Usted Necesita Algo Despus de Su Visita  Tambin puede enviarnos un mensaje a travs de MyChart. Por lo general respondemos a los mensajes de MyChart en el transcurso de 1 a 2 das hbiles.  Para renovar recetas, por favor pida a su farmacia que se ponga en contacto con nuestra oficina. Nuestro nmero de fax es el [phone].  Si tiene un asunto urgente cuando la clnica est cerrada y que no puede esperar hasta el siguiente da hbil, puede llamar/localizar a su doctor(a) al nmero que aparece a continuacin.   Por favor, tenga en cuenta que aunque hacemos todo lo posible para estar disponibles para asuntos urgentes fuera del horario de oficina, no estamos disponibles las 24 horas del da, los 7 das de la semana.   Si tiene un problema urgente y no puede comunicarse con nosotros, puede  optar por buscar atencin mdica  en el consultorio de su doctor(a), en una clnica privada, en un centro de atencin urgente o en una sala de emergencias.  Si tiene una emergencia mdica, por favor llame inmediatamente al 911 o vaya a la sala de emergencias.  Nmeros de bper  - Dr. Kowalski: [phone]  - Dra. Moye: [phone]  - Dra. Stewart: [phone]  En caso de inclemencias del tiempo, por favor llame a nuestra lnea principal al [phone] para una actualizacin sobre el estado de cualquier retraso o cierre.  Consejos para la medicacin en dermatologa: Por favor, guarde las cajas en las que vienen los medicamentos de uso tpico para ayudarle a seguir las instrucciones sobre dnde y cmo usarlos. Las farmacias generalmente imprimen las instrucciones del medicamento slo en las cajas y no directamente en los tubos del medicamento.   Si su medicamento es muy caro, por favor, pngase en contacto con   nuestra oficina llamando al [phone] y presione la opcin 4 o envenos un mensaje a travs de MyChart.   No podemos decirle cul ser su copago por los medicamentos por adelantado ya que esto es diferente dependiendo de la cobertura de su seguro. Sin embargo, es posible que podamos encontrar un medicamento sustituto a menor costo o llenar un formulario para que el seguro cubra el medicamento que se considera necesario.   Si se requiere una autorizacin previa para que su compaa de seguros cubra su medicamento, por favor permtanos de 1 a 2 das hbiles para completar este proceso.  Los precios de los medicamentos varan con frecuencia dependiendo del lugar de dnde se surte la receta y alguna farmacias pueden ofrecer precios ms baratos.  El sitio web www.goodrx.com tiene cupones para medicamentos de diferentes farmacias. Los precios aqu no tienen en cuenta lo que podra costar con la ayuda del seguro (puede ser ms barato con su seguro), pero el sitio web puede darle el  precio si no utiliz ningn seguro.  - Puede imprimir el cupn correspondiente y llevarlo con su receta a la farmacia.  - Tambin puede pasar por nuestra oficina durante el horario de atencin regular y recoger una tarjeta de cupones de GoodRx.  - Si necesita que su receta se enve electrnicamente a una farmacia diferente, informe a nuestra oficina a travs de MyChart de [place] o por telfono llamando al [phone] y presione la opcin 4.  

## 2022-11-02 NOTE — Progress Notes (Signed)
   Follow-Up Visit   Subjective  Ofilia Wolfinger is a 77 y.o. female who presents for the following: recheck spot on R nose, mole forehead has had >1 yr, itchy prn, check spot groin, ~15m, black and itchy, check spots R cheek, no symptoms, spots back itchy prn   The following portions of the chart were reviewed this encounter and updated as appropriate: medications, allergies, medical history  Review of Systems:  No other skin or systemic complaints except as noted in HPI or Assessment and Plan.  Objective  Well appearing patient in no apparent distress; mood and affect are within normal limits.   A focused examination was performed of the following areas: Face, groin, back  central lower abdomen at scar 2.36mm firm pap Central lower abdomen at scar with punctum    Assessment & Plan   Epidermal cyst central lower abdomen at scar  Benign-appearing. Exam most consistent with an epidermal inclusion cyst. Discussed that a cyst is a benign growth that can grow over time and sometimes get irritated or inflamed. Recommend observation if it is not bothersome. Discussed option of surgical excision to remove it if it is growing, symptomatic, or other changes noted. Please call for new or changing lesions so they can be evaluated.  Symptomatic, irritating, patient would like treated.     Acne/Milia surgery - central lower abdomen at scar Procedure risks and benefits were discussed with the patient and verbal consent was obtained. Following prep of the skin on the central lower abdomen at scar with an alcohol swab, and local anesthesia injection, extraction of comedones was performed with a comedone extractor. Vaseline ointment was applied to each site. The patient tolerated the procedure well.  Fibrous Papule vs Sebaceous Hyperplasia Exam: 4.0 x 2.24mm firm pink flesh pap R nasal tip  - Benign-appearing.  Stable. Observation.  Call clinic for new or changing lesions.    Melanocytic  Nevus- forehead - flesh-colored papule central pigment - Benign appearing on exam today - Observation - Call clinic for new or changing moles - Recommend daily use of broad spectrum spf 30+ sunscreen to sun-exposed areas.    Seborrheic Keratoses- forehead, R cheek, back - Stuck-on, waxy, tan-brown papules and/or plaques  - Benign-appearing - Discussed benign etiology and prognosis. - Reassured benign age-related growth.  Recommend observation.  Discussed cryotherapy if spot(s) become irritated or inflamed.  - Call for any changes   Epidermal Inclusion Cyst Exam: 3.37mm firm flesh pap L cheek  Benign-appearing. Exam most consistent with an epidermal inclusion cyst. Discussed that a cyst is a benign growth that can grow over time and sometimes get irritated or inflamed. Recommend observation if it is not bothersome. Discussed option of surgical excision to remove it if it is growing, symptomatic, or other changes noted. Please call for new or changing lesions so they can be evaluated.   Return if symptoms worsen or fail to improve.  I, Othelia Pulling, RMA, am acting as scribe for Brendolyn Patty, MD .   Documentation: I have reviewed the above documentation for accuracy and completeness, and I agree with the above.  Brendolyn Patty, MD

## 2022-11-03 ENCOUNTER — Ambulatory Visit: Payer: Medicare Other | Admitting: Dermatology

## 2022-12-02 ENCOUNTER — Ambulatory Visit: Payer: Medicare Other | Admitting: Family Medicine

## 2022-12-07 ENCOUNTER — Other Ambulatory Visit: Payer: Self-pay | Admitting: Family Medicine

## 2022-12-07 DIAGNOSIS — I10 Essential (primary) hypertension: Secondary | ICD-10-CM

## 2022-12-29 ENCOUNTER — Other Ambulatory Visit: Payer: Self-pay

## 2022-12-29 DIAGNOSIS — H2513 Age-related nuclear cataract, bilateral: Secondary | ICD-10-CM | POA: Diagnosis not present

## 2022-12-29 DIAGNOSIS — H04123 Dry eye syndrome of bilateral lacrimal glands: Secondary | ICD-10-CM | POA: Diagnosis not present

## 2023-01-05 NOTE — Progress Notes (Signed)
[facility] [address] [place] [address] Phone: [phone] Subjective:   [facility], am serving as a scribe for Dr. Antoine Primas.  I'm seeing this patient by the request  of:  Lindsey Luis, MD  CC: left shoulder and neck pain   BJY:[id number]  10/13/2022 Multifactorial, seems to be more muscle imbalance, discussed posture and ergonomics throughout the day, increase activity slowly.  Follow-up with me again in 6 to 8 weeks if needed.  Could consider trigger point injections if needed.  Hopefully patient will do well with this and the home exercises.   Feels like she is is doing well with the knees.  We discussed the details of the exercises, icing regimen, which activities to do and which ones to avoid.  I think we can hold on the viscosupplementation and we will consider beginning the injection if needed.  Continue patient to be active.  Follow-up again in 6 to 8 weeks if necessary otherwise 3 months      Update 01/10/2023 Lindsey Foster is a 77 y.o. female coming in with complaint of L shoulder and neck pain. Also knee pain. Patient states R hurts more than L knee. Was doing some gardening and has gotten worse. Knee feels tight. Shoulder is doing better.  L shoulder xray 10/13/2022 IMPRESSION: Minimal acromioclavicular and glenohumeral osteoarthritis   [facility] approved     Past Medical History:  Diagnosis Date   Allergic rhinitis    Anxiety    panic attacks in hot rooms   Arthritis    Blood in stool    Breast cancer (HCC) 2006   radiation - Rt   Chickenpox    CKD (chronic kidney disease)    Colon polyps    Diverticulosis    GERD (gastroesophageal reflux disease)    Heart murmur    Hyperlipidemia    Hypertension    Hypothyroidism    Multiple thyroid nodules    Osteopenia    Personal history of radiation therapy    Urinary incontinence    UTI (lower urinary tract infection)    Vitamin D deficiency disease     Past Surgical History:  Procedure Laterality Date   BLADDER SURGERY     BREAST EXCISIONAL BIOPSY Right 2006   positive   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   CESAREAN SECTION  1985   COLONOSCOPY N/A 04/30/2021   Procedure: COLONOSCOPY;  Surgeon: Regis Bill, MD;  Location: [facility] ENDOSCOPY;  Service: Endoscopy;  Laterality: N/A;   COLONOSCOPY WITH PROPOFOL N/A 09/28/2015   Procedure: COLONOSCOPY WITH PROPOFOL;  Surgeon: Christena Deem, MD;  Location[facility] ENDOSCOPY;  Service: Endoscopy;  Laterality: N/A;   DILATION AND CURETTAGE OF UTERUS  2012   ESOPHAGOGASTRODUODENOSCOPY (EGD) WITH PROPOFOL N/A 08/23/2022   Procedure: ESOPHAGOGASTRODUODENOSCOPY (EGD) WITH PROPOFOL;  Surgeon: Regis Bill, MD;  Location: [facility] ENDOSCOPY;  Service: Endoscopy;  Laterality: N/A;   Thyroid nodule ablation  2003   Uterine polyp removal  2009    Social History   Socioeconomic History   Marital status: Widowed    Spouse name: Not on file   Number of children: Not on file   Years of education: Not on file   Highest education level: Not on file  Occupational History   Not on file  Tobacco Use   Smoking status: Former   Smokeless tobacco: Never  Vaping Use   Vaping Use: Never used  Substance and Sexual Activity  Alcohol use: No    Alcohol/week: 0.0 standard drinks of alcohol   Drug use: No   Sexual activity: Not on file  Other Topics Concern   Not on file  Social History Narrative   Not on file   Social Determinants of Health   Financial Resource Strain: Low Risk  (03/11/2022)   Overall Financial Resource Strain (CARDIA)    Difficulty of Paying Living Expenses: Not hard at all  Food Insecurity: No Food Insecurity (03/11/2022)   Hunger Vital Sign    Worried About Running Out of Food in the Last Year: Never true    Ran Out of Food in the Last Year: Never true  Transportation Needs: No Transportation Needs (03/11/2022)   PRAPARE - Administrator, Civil Service (Medical):  No    Lack of Transportation (Non-Medical): No  Physical Activity: Insufficiently Active (03/11/2022)   Exercise Vital Sign    Days of Exercise per Week: 2 days    Minutes of Exercise per Session: 30 min  Stress: No Stress Concern Present (03/11/2022)   [employer] of Occupational Health - Occupational Stress Questionnaire    Feeling of Stress : Not at all  Social Connections: Unknown (03/11/2022)   Social Connection and Isolation Panel [NHANES]    Frequency of Communication with Friends and Family: More than three times a week    Frequency of Social Gatherings with Friends and Family: Not on file    Attends Religious Services: Not on file    Active Member of Clubs or Organizations: Not on file    Attends Banker Meetings: Not on file    Marital Status: Not on file   Allergies  Allergen Reactions   Latex Rash   Family History  Problem Relation Age of Onset   Angina Father    Heart attack Father    Multiple myeloma Brother    Uterine cancer Maternal Aunt    Heart disease Other        Parent   Breast cancer Neg Hx      Current Outpatient Medications (Cardiovascular):    amLODipine (NORVASC) 5 MG tablet, TAKE 1 TABLET(5 MG) BY MOUTH DAILY  Current Outpatient Medications (Respiratory):    fluticasone (FLONASE SENSIMIST) 27.5 MCG/SPRAY nasal spray, Place 2 sprays into the nose daily.   loratadine (CLARITIN) 10 MG tablet, Take 1 tablet (10 mg total) by mouth daily.    Current Outpatient Medications (Other):    Calcium Citrate-Vitamin D3 1000-400 LIQD, Take 1 tablet by mouth daily. Reported on 02/04/2016   clobetasol cream (TEMOVATE) 0.05 %, Apply topically 2 (two) times daily. For 2 weeks, then just on week days as needed   clonazePAM (KLONOPIN) 0.5 MG tablet, Take 0.5 tablets (0.25 mg total) by mouth once as needed for up to 1 dose for anxiety. Take prior to getting on the plane during travel   Crisaborole (EUCRISA) 2 % OINT, Apply to affected skin once a  day   famotidine (PEPCID) 20 MG tablet, TAKE 1 TABLET(20 MG) BY MOUTH TWICE DAILY BEFORE A MEAL   Garlic 100 MG TABS, Take 1 tablet by mouth daily.    ketotifen (ZADITOR) 0.025 % ophthalmic solution, 1 drop 2 (two) times daily.   mometasone (ELOCON) 0.1 % lotion, Apply topically daily.   Reviewed prior external information including notes and imaging from  primary care provider As well as notes that were available from care everywhere and other healthcare systems.  Past medical history, social, surgical and family  history all reviewed in electronic medical record.  No pertanent information unless stated regarding to the chief complaint.   Review of Systems:  No headache, visual changes, nausea, vomiting, diarrhea, constipation, dizziness, abdominal pain, skin rash, fevers, chills, night sweats, weight loss, swollen lymph nodes, body aches, joint swelling, chest pain, shortness of breath, mood changes. POSITIVE muscle aches  Objective  Blood pressure 132/80, pulse 68, height 5\' 7"  (1.702 m), weight 182 lb (82.6 kg), SpO2 97 %.   General: No apparent distress alert and oriented x3 mood and affect normal, dressed appropriately.  HEENT: Pupils equal, extraocular movements intact  Respiratory: Patient's speak in full sentences and does not appear short of breath  Cardiovascular: No lower extremity edema, non tender, no erythema  Bilateral knees do have some crepitus noted.  He does have an effusion noted of the right knee.  Only 90 degrees of flexion secondary to the swelling at the knee.  Instability noted with valgus and varus force bilaterally.  After informed written and verbal consent, patient was seated on exam table. Right knee was prepped with alcohol swab and utilizing anterolateral approach, patient's right knee space was injected with 4:1  marcaine 0.5%: Kenalog 40mg /dL. Patient tolerated the procedure well without immediate complications.  After informed written and verbal consent,  patient was seated on exam table. Left knee was prepped with alcohol swab and utilizing anterolateral approach, patient's left knee space was injected with 40 mg per 3 mL of Monovisc (sodium hyaluronate) in a prefilled syringe was injected easily into the knee through a 22-gauge needle..Patient tolerated the procedure well without immediate complications.    Impression and Recommendations:     The above documentation has been reviewed and is accurate and complete Judi Saa, DO

## 2023-01-10 ENCOUNTER — Ambulatory Visit (INDEPENDENT_AMBULATORY_CARE_PROVIDER_SITE_OTHER): Payer: Medicare Other | Admitting: Family Medicine

## 2023-01-10 ENCOUNTER — Encounter: Payer: Self-pay | Admitting: Family Medicine

## 2023-01-10 VITALS — BP 132/80 | HR 68 | Ht 67.0 in | Wt 182.0 lb

## 2023-01-10 DIAGNOSIS — M1712 Unilateral primary osteoarthritis, left knee: Secondary | ICD-10-CM

## 2023-01-10 DIAGNOSIS — M1711 Unilateral primary osteoarthritis, right knee: Secondary | ICD-10-CM | POA: Diagnosis not present

## 2023-01-10 DIAGNOSIS — M170 Bilateral primary osteoarthritis of knee: Secondary | ICD-10-CM

## 2023-01-10 MED ORDER — HYALURONAN 88 MG/4ML IX SOSY
88.0000 mg | PREFILLED_SYRINGE | Freq: Once | INTRA_ARTICULAR | Status: AC
  Administered 2023-01-10: 88 mg via INTRA_ARTICULAR

## 2023-01-10 NOTE — Assessment & Plan Note (Signed)
Chronic problem with worsening symptoms.  Now right knee is starting to give her severe pain as well. Arthritic changes with some instability noted.  No abnormal thigh to calf ratio noted.  Patient is ambulatory with the aid of a cane.  Discussed with patient about different treatment options and elected to try a steroid injection on the right knee and wanted to go through with the viscosupplementation.  Increase activity slowly otherwise.  Follow-up with me again in 6 to 8 weeks.

## 2023-01-10 NOTE — Patient Instructions (Addendum)
Injections in knees today See you again in 2 months

## 2023-01-19 ENCOUNTER — Telehealth: Payer: Self-pay | Admitting: Family Medicine

## 2023-01-19 NOTE — Telephone Encounter (Signed)
Patient called stating that she has been having some swelling ("little puffy") and discomfort in both knees but right is worse than left. She was requesting an appointment with Dr Katrinka Blazing on Monday or Tuesday. I offered Dr Denyse Amass or Dr Jean Rosenthal but she refused at this time and was only wanting to see Dr Katrinka Blazing. She asked if Dr Katrinka Blazing could advise her at this point and wanted to know if icing would help? (She is currently on vacation, will be coming home today and then is going out of town again next week)

## 2023-01-19 NOTE — Telephone Encounter (Signed)
Sent patient MyChart message.

## 2023-01-24 ENCOUNTER — Ambulatory Visit: Payer: Medicare Other | Admitting: Family Medicine

## 2023-01-30 DIAGNOSIS — N1831 Chronic kidney disease, stage 3a: Secondary | ICD-10-CM | POA: Diagnosis not present

## 2023-01-30 DIAGNOSIS — E7849 Other hyperlipidemia: Secondary | ICD-10-CM | POA: Diagnosis not present

## 2023-01-30 DIAGNOSIS — I10 Essential (primary) hypertension: Secondary | ICD-10-CM | POA: Diagnosis not present

## 2023-01-30 DIAGNOSIS — I340 Nonrheumatic mitral (valve) insufficiency: Secondary | ICD-10-CM | POA: Diagnosis not present

## 2023-02-02 ENCOUNTER — Ambulatory Visit: Payer: Medicare Other | Admitting: Family Medicine

## 2023-02-03 NOTE — Telephone Encounter (Signed)
Patient called stating that she has tried those recommendations but is still having a lot of pain, stiffness and decreased range of motion.   Any other recommendations?  (I have her on the cancellation with Dr Katrinka Blazing and offered for her to see Dr Denyse Amass or Dr Jean Rosenthal but she declined at this time.)

## 2023-02-06 NOTE — Telephone Encounter (Signed)
Sent patient a MyChart message

## 2023-02-09 DIAGNOSIS — N1831 Chronic kidney disease, stage 3a: Secondary | ICD-10-CM | POA: Diagnosis not present

## 2023-02-14 DIAGNOSIS — N1831 Chronic kidney disease, stage 3a: Secondary | ICD-10-CM | POA: Diagnosis not present

## 2023-02-14 DIAGNOSIS — N2581 Secondary hyperparathyroidism of renal origin: Secondary | ICD-10-CM | POA: Diagnosis not present

## 2023-02-14 DIAGNOSIS — I10 Essential (primary) hypertension: Secondary | ICD-10-CM | POA: Diagnosis not present

## 2023-03-13 ENCOUNTER — Ambulatory Visit (INDEPENDENT_AMBULATORY_CARE_PROVIDER_SITE_OTHER): Payer: Medicare Other | Admitting: *Deleted

## 2023-03-13 VITALS — Ht 67.0 in | Wt 180.0 lb

## 2023-03-13 DIAGNOSIS — Z0000 Encounter for general adult medical examination without abnormal findings: Secondary | ICD-10-CM

## 2023-03-13 NOTE — Progress Notes (Unsigned)
[facility] [address] [place] [address] Phone: [phone] Subjective:    I'm seeing this patient by the request  of:  Lindsey Luis, MD  CC: Bilateral knee pain follow-up  [id number]  01/10/2023 Chronic problem with worsening symptoms.  Now right knee is starting to give her severe pain as well. Arthritic changes with some instability noted.  No abnormal thigh to calf ratio noted.  Patient is ambulatory with the aid of a cane.  Discussed with patient about different treatment options and elected to try a steroid injection on the right knee and wanted to go through with the viscosupplementation.  Increase activity slowly otherwise.  Follow-up with me again in 6 to 8 weeks.          Update 03/14/2023 Lindsey Foster is a 77 y.o. female coming in with complaint of B knee pain. Patient states knee are better than they have been. After injections she has had more flares but they are gradually getting better. She states that she is not able to bend her knees. She notes when she is walking she is carrying her legs and not bending them. She does not want surgery       Past Medical History:  Diagnosis Date   Allergic rhinitis    Anxiety    panic attacks in hot rooms   Arthritis    Blood in stool    Breast cancer (HCC) 2006   radiation - Rt   Chickenpox    CKD (chronic kidney disease)    Colon polyps    Diverticulosis    GERD (gastroesophageal reflux disease)    Heart murmur    Hyperlipidemia    Hypertension    Hypothyroidism    Multiple thyroid nodules    Osteopenia    Personal history of radiation therapy    Urinary incontinence    UTI (lower urinary tract infection)    Vitamin D deficiency disease    Past Surgical History:  Procedure Laterality Date   BLADDER SURGERY     BREAST EXCISIONAL BIOPSY Right 2006   positive   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   CESAREAN SECTION  1985   COLONOSCOPY N/A 04/30/2021    Procedure: COLONOSCOPY;  Surgeon: Regis Bill, MD;  Location: [facility] ENDOSCOPY;  Service: Endoscopy;  Laterality: N/A;   COLONOSCOPY WITH PROPOFOL N/A 09/28/2015   Procedure: COLONOSCOPY WITH PROPOFOL;  Surgeon: Christena Deem, MD;  Location: [facility] ENDOSCOPY;  Service: Endoscopy;  Laterality: N/A;   DILATION AND CURETTAGE OF UTERUS  2012   ESOPHAGOGASTRODUODENOSCOPY (EGD) WITH PROPOFOL N/A 08/23/2022   Procedure: ESOPHAGOGASTRODUODENOSCOPY (EGD) WITH PROPOFOL;  Surgeon: Regis Bill, MD;  Location: [facility] ENDOSCOPY;  Service: Endoscopy;  Laterality: N/A;   Thyroid nodule ablation  2003   Uterine polyp removal  2009    Social History   Socioeconomic History   Marital status: Widowed    Spouse name: Not on file   Number of children: Not on file   Years of education: Not on file   Highest education level: Not on file  Occupational History   Not on file  Tobacco Use   Smoking status: Former   Smokeless tobacco: Never  Vaping Use   Vaping status: Never Used  Substance and Sexual Activity   Alcohol use: No    Alcohol/week: 0.0 standard drinks of alcohol   Drug use: No   Sexual activity: Not on file  Other Topics Concern  Not on file  Social History Narrative   Not on file   Social Determinants of Health   Financial Resource Strain: Low Risk  (03/13/2023)   Overall Financial Resource Strain (CARDIA)    Difficulty of Paying Living Expenses: Not hard at all  Food Insecurity: No Food Insecurity (03/13/2023)   Hunger Vital Sign    Worried About Running Out of Food in the Last Year: Never true    Ran Out of Food in the Last Year: Never true  Transportation Needs: No Transportation Needs (03/13/2023)   PRAPARE - Administrator, Civil Service (Medical): No    Lack of Transportation (Non-Medical): No  Physical Activity: Sufficiently Active (03/13/2023)   Exercise Vital Sign    Days of Exercise per Week: 2 days    Minutes of Exercise per Session: 90 min  Stress:  No Stress Concern Present (03/13/2023)   [employer] of Occupational Health - Occupational Stress Questionnaire    Feeling of Stress : Not at all  Social Connections: Moderately Integrated (03/13/2023)   Social Connection and Isolation Panel [NHANES]    Frequency of Communication with Friends and Family: More than three times a week    Frequency of Social Gatherings with Friends and Family: More than three times a week    Attends Religious Services: More than 4 times per year    Active Member of [employer] or Organizations: Yes    Attends Banker Meetings: More than 4 times per year    Marital Status: Widowed   Allergies  Allergen Reactions   Latex Rash   Family History  Problem Relation Age of Onset   Angina Father    Heart attack Father    Multiple myeloma Brother    Uterine cancer Maternal Aunt    Heart disease Other        Parent   Breast cancer Neg Hx      Current Outpatient Medications (Cardiovascular):    amLODipine (NORVASC) 5 MG tablet, TAKE 1 TABLET(5 MG) BY MOUTH DAILY  Current Outpatient Medications (Respiratory):    fluticasone (FLONASE SENSIMIST) 27.5 MCG/SPRAY nasal spray, Place 2 sprays into the nose daily.   loratadine (CLARITIN) 10 MG tablet, Take 1 tablet (10 mg total) by mouth daily.    Current Outpatient Medications (Other):    Calcium Citrate-Vitamin D3 1000-400 LIQD, Take 1 tablet by mouth daily. Reported on 02/04/2016   clobetasol cream (TEMOVATE) 0.05 %, Apply topically 2 (two) times daily. For 2 weeks, then just on week days as needed   clonazePAM (KLONOPIN) 0.5 MG tablet, Take 0.5 tablets (0.25 mg total) by mouth once as needed for up to 1 dose for anxiety. Take prior to getting on the plane during travel   Crisaborole (EUCRISA) 2 % OINT, Apply to affected skin once a day   famotidine (PEPCID) 20 MG tablet, TAKE 1 TABLET(20 MG) BY MOUTH TWICE DAILY BEFORE A MEAL (Patient taking differently: TAKE 1 TABLET(20 MG) BY MOUTH TWICE DAILY  BEFORE A MEAL as needed)   Garlic 100 MG TABS, Take 1 tablet by mouth daily.    ketotifen (ZADITOR) 0.025 % ophthalmic solution, As needed   mometasone (ELOCON) 0.1 % lotion, Apply topically daily.   Reviewed prior external information including notes and imaging from  primary care provider As well as notes that were available from care everywhere and other healthcare systems.  Past medical history, social, surgical and family history all reviewed in electronic medical record.  No pertanent information unless  stated regarding to the chief complaint.   Review of Systems:  No headache, visual changes, nausea, vomiting, diarrhea, constipation, dizziness, abdominal pain, skin rash, fevers, chills, night sweats, weight loss, swollen lymph nodes, chest pain, shortness of breath, mood changes. POSITIVE muscle aches, body aches, joint swelling  Objective  Blood pressure 126/82, pulse 72, height 5\' 7"  (1.702 m), weight 179 lb (81.2 kg), SpO2 98%.   General: No apparent distress alert and oriented x3 mood and affect normal, dressed appropriately.  HEENT: Pupils equal, extraocular movements intact  Respiratory: Patient's speak in full sentences and does not appear short of breath  Cardiovascular: No lower extremity edema, non tender, no erythema  Antalgic gait noted walking with the aid of a cane.  Patient does have instability of the knees bilaterally.  Does have some significant arthritic changes of the patellofemoral joint bilaterally causing decrease in range of motion to only 90 degrees of right seems to be more painful than the left at the moment.    Impression and Recommendations:     The above documentation has been reviewed and is accurate and complete Judi Saa, DO

## 2023-03-13 NOTE — Progress Notes (Signed)
Subjective:   Lindsey Foster is a 77 y.o. female who presents for Medicare Annual (Subsequent) preventive examination.  Visit Complete: Virtual  I connected with  Lindsey Foster on 03/13/23 by a audio enabled telemedicine application and verified that I am speaking with the correct person using two identifiers.  Patient Location: Home  Provider Location: Office/Clinic  I discussed the limitations of evaluation and management by telemedicine. The patient expressed understanding and agreed to proceed.   Vital Signs: Unable to obtain new vitals due to this being a telehealth visit.   Review of Systems     Cardiac Risk Factors include: advanced age (>51men, >60 women);dyslipidemia;hypertension;Other (see comment), Risk factor comments: Palpitations     Objective:    Today's Vitals   03/13/23 1209  Weight: 180 lb (81.6 kg)  Height: 5\' 7"  (1.702 m)   Body mass index is 28.19 kg/m.     03/13/2023   12:30 PM 08/23/2022    7:43 AM 03/11/2022   12:58 PM 04/30/2021    8:34 AM 02/16/2021   10:00 AM 08/13/2020   11:14 AM 02/14/2020    9:42 AM  Advanced Directives  Does Patient Have a Medical Advance Directive? Yes Yes No No Yes No Yes  Type of Advance Directive  Living will   Healthcare Power of [place];Living will  Healthcare Power of [place];Living will  Does patient want to make changes to medical advance directive?     No - Patient declined  No - Patient declined  Copy of Healthcare Power of Attorney in Chart?     No - copy requested  No - copy requested  Would patient like information on creating a medical advance directive?   No - Patient declined No - Patient declined  No - Patient declined     Current Medications (verified) Outpatient Encounter Medications as of 03/13/2023  Medication Sig   amLODipine (NORVASC) 5 MG tablet TAKE 1 TABLET(5 MG) BY MOUTH DAILY   Calcium Citrate-Vitamin D3 1000-400 LIQD Take 1 tablet by mouth daily. Reported on 02/04/2016    clobetasol cream (TEMOVATE) 0.05 % Apply topically 2 (two) times daily. For 2 weeks, then just on week days as needed   Crisaborole (EUCRISA) 2 % OINT Apply to affected skin once a day   famotidine (PEPCID) 20 MG tablet TAKE 1 TABLET(20 MG) BY MOUTH TWICE DAILY BEFORE A MEAL (Patient taking differently: TAKE 1 TABLET(20 MG) BY MOUTH TWICE DAILY BEFORE A MEAL as needed)   fluticasone (FLONASE SENSIMIST) 27.5 MCG/SPRAY nasal spray Place 2 sprays into the nose daily.   Garlic 100 MG TABS Take 1 tablet by mouth daily.    ketotifen (ZADITOR) 0.025 % ophthalmic solution As needed   loratadine (CLARITIN) 10 MG tablet Take 1 tablet (10 mg total) by mouth daily.   mometasone (ELOCON) 0.1 % lotion Apply topically daily.   clonazePAM (KLONOPIN) 0.5 MG tablet Take 0.5 tablets (0.25 mg total) by mouth once as needed for up to 1 dose for anxiety. Take prior to getting on the plane during travel (Patient not taking: Reported on 03/13/2023)   No facility-administered encounter medications on file as of 03/13/2023.    Allergies (verified) Latex   History: Past Medical History:  Diagnosis Date   Allergic rhinitis    Anxiety    panic attacks in hot rooms   Arthritis    Blood in stool    Breast cancer (HCC) 2006   radiation - Rt   Chickenpox    CKD (  chronic kidney disease)    Colon polyps    Diverticulosis    GERD (gastroesophageal reflux disease)    Heart murmur    Hyperlipidemia    Hypertension    Hypothyroidism    Multiple thyroid nodules    Osteopenia    Personal history of radiation therapy    Urinary incontinence    UTI (lower urinary tract infection)    Vitamin D deficiency disease    Past Surgical History:  Procedure Laterality Date   BLADDER SURGERY     BREAST EXCISIONAL BIOPSY Right 2006   positive   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   CESAREAN SECTION  1985   COLONOSCOPY N/A 04/30/2021   Procedure: COLONOSCOPY;  Surgeon: Regis Bill, MD;  Location: [facility] ENDOSCOPY;   Service: Endoscopy;  Laterality: N/A;   COLONOSCOPY WITH PROPOFOL N/A 09/28/2015   Procedure: COLONOSCOPY WITH PROPOFOL;  Surgeon: Christena Deem, MD;  Location: [facility] ENDOSCOPY;  Service: Endoscopy;  Laterality: N/A;   DILATION AND CURETTAGE OF UTERUS  2012   ESOPHAGOGASTRODUODENOSCOPY (EGD) WITH PROPOFOL N/A 08/23/2022   Procedure: ESOPHAGOGASTRODUODENOSCOPY (EGD) WITH PROPOFOL;  Surgeon: Regis Bill, MD;  Location: [facility] ENDOSCOPY;  Service: Endoscopy;  Laterality: N/A;   Thyroid nodule ablation  2003   Uterine polyp removal  2009    Family History  Problem Relation Age of Onset   Angina Father    Heart attack Father    Multiple myeloma Brother    Uterine cancer Maternal Aunt    Heart disease Other        Parent   Breast cancer Neg Hx    Social History   Socioeconomic History   Marital status: Widowed    Spouse name: Not on file   Number of children: Not on file   Years of education: Not on file   Highest education level: Not on file  Occupational History   Not on file  Tobacco Use   Smoking status: Former   Smokeless tobacco: Never  Vaping Use   Vaping status: Never Used  Substance and Sexual Activity   Alcohol use: No    Alcohol/week: 0.0 standard drinks of alcohol   Drug use: No   Sexual activity: Not on file  Other Topics Concern   Not on file  Social History Narrative   Not on file   Social Determinants of Health   Financial Resource Strain: Low Risk  (03/13/2023)   Overall Financial Resource Strain (CARDIA)    Difficulty of Paying Living Expenses: Not hard at all  Food Insecurity: No Food Insecurity (03/13/2023)   Hunger Vital Sign    Worried About Running Out of Food in the Last Year: Never true    Ran Out of Food in the Last Year: Never true  Transportation Needs: No Transportation Needs (03/13/2023)   PRAPARE - Administrator, Civil Service (Medical): No    Lack of Transportation (Non-Medical): No  Physical Activity: Sufficiently  Active (03/13/2023)   Exercise Vital Sign    Days of Exercise per Week: 2 days    Minutes of Exercise per Session: 90 min  Stress: No Stress Concern Present (03/13/2023)   [employer] of Occupational Health - Occupational Stress Questionnaire    Feeling of Stress : Not at all  Social Connections: Moderately Integrated (03/13/2023)   Social Connection and Isolation Panel [NHANES]    Frequency of Communication with Friends and Family: More than three times a week    Frequency of  Social Gatherings with Friends and Family: More than three times a week    Attends Religious Services: More than 4 times per year    Active Member of [employer] or Organizations: Yes    Attends Banker Meetings: More than 4 times per year    Marital Status: Widowed    Tobacco Counseling Counseling given: Not Answered   Clinical Intake:  Pre-visit preparation completed: Yes  Pain : No/denies pain     BMI - recorded: 28.19 Nutritional Status: BMI 25 -29 Overweight Nutritional Risks: None Diabetes: No  How often do you need to have someone help you when you read instructions, pamphlets, or other written materials from your doctor or pharmacy?: 1 - Never  Interpreter Needed?: No  Information entered by :: R. Ankita Newcomer LPN   Activities of Daily Living    03/13/2023   12:14 PM  In your present state of health, do you have any difficulty performing the following activities:  Hearing? 1  Comment some hearing loss  Vision? 0  Comment glasses  Difficulty concentrating or making decisions? 0  Walking or climbing stairs? 1  Comment stairs  Dressing or bathing? 0  Doing errands, shopping? 0  Preparing Food and eating ? N  Using the Toilet? N  In the past six months, have you accidently leaked urine? Y  Comment occassionally  Do you have problems with loss of bowel control? N  Managing your Medications? N  Managing your Finances? N  Housekeeping or managing your Housekeeping? N    Patient  Care Team: [name], MD as PCP - General (Family Medicine)  Indicate any recent Medical Services you may have received from other than [facility] providers in the past year (date may be approximate).     Assessment:   This is a routine wellness examination for [place].  Hearing/Vision screen Hearing Screening - Comments:: Some issues Vision Screening - Comments:: glasses  Dietary issues and exercise activities discussed:     Goals Addressed             This Visit's Progress    Patient Stated       Wants to stay active       Depression Screen    03/13/2023   12:24 PM 08/30/2022   10:45 AM 03/28/2022    1:38 PM 03/11/2022   12:45 PM 02/08/2022   10:31 AM 11/09/2021   10:20 AM 08/03/2021    9:47 AM  PHQ 2/9 Scores  PHQ - 2 Score 0 0 0 0 0 0 0  PHQ- 9 Score 0          Fall Risk    03/13/2023   12:17 PM 08/30/2022   10:47 AM 03/28/2022    1:38 PM 11/09/2021   10:20 AM 08/03/2021    9:47 AM  Fall Risk   Falls in the past year? 0 0 0 0 0  Number falls in past yr: 0 0  0 0  Injury with Fall? 0 0  0 0  Risk for fall due to : No Fall Risks No Fall Risks No Fall Risks No Fall Risks No Fall Risks  Follow up Falls prevention discussed;Falls evaluation completed Falls evaluation completed Falls evaluation completed Falls evaluation completed Falls evaluation completed    MEDICARE RISK AT HOME:  Medicare Risk at Home - 03/13/23 1218     Any stairs in or around the home? Yes    If so, are there any without handrails? No  Home free of loose throw rugs in walkways, pet beds, electrical cords, etc? Yes    Adequate lighting in your home to reduce risk of falls? Yes    Life alert? No    Use of a cane, walker or w/c? Yes    Grab bars in the bathroom? Yes    Shower chair or bench in shower? Yes    Elevated toilet seat or a handicapped toilet? No              Cognitive Function:    02/08/2018    9:35 AM 02/07/2017    9:58 AM  MMSE - Mini Mental State Exam   Orientation to time 5 5  Orientation to Place 5 5  Registration 3 3  Attention/ Calculation 5 5  Recall 3 3  Language- name 2 objects 2 2  Language- repeat 1 1  Language- follow 3 step command 3 3  Language- read & follow direction 1 1  Write a sentence 1 1  Copy design 1 1  Total score 30 30        03/13/2023   12:33 PM 02/14/2020    9:53 AM 02/13/2019    9:48 AM  6CIT Screen  What Year? 0 points 0 points 0 points  What month? 0 points 0 points 0 points  What time? 0 points  0 points  Count back from 20 0 points  0 points  Months in reverse 0 points 0 points 0 points  Repeat phrase 0 points 0 points   Total Score 0 points      Immunizations Immunization History  Administered Date(s) Administered   Fluad Quad(high Dose 65+) 07/17/2019, 07/27/2020, 05/17/2022   PFIZER Comirnaty(Gray Top)Covid-19 Tri-Sucrose Vaccine 11/09/2020   PFIZER(Purple Top)SARS-COV-2 Vaccination 09/26/2019, 10/17/2019   Pfizer Covid-19 Vaccine Bivalent Booster 63yrs & up 06/15/2021    TDAP status: Due, Education has been provided regarding the importance of this vaccine. Advised may receive this vaccine at local pharmacy or Health Dept. Aware to provide a copy of the vaccination record if obtained from local pharmacy or Health Dept. Verbalized acceptance and understanding.  Flu Vaccine status: Up to date  Pneumococcal vaccine status: Declined,  Education has been provided regarding the importance of this vaccine but patient still declined. Advised may receive this vaccine at local pharmacy or Health Dept. Aware to provide a copy of the vaccination record if obtained from local pharmacy or Health Dept. Verbalized acceptance and understanding.   Covid-19 vaccine status: Completed vaccines  Qualifies for Shingles Vaccine? Yes   Zostavax completed No   Shingrix Completed?: No.    Education has been provided regarding the importance of this vaccine. Patient has been advised to call insurance company to  determine out of pocket expense if they have not yet received this vaccine. Advised may also receive vaccine at local pharmacy or Health Dept. Verbalized acceptance and understanding.  Screening Tests Health Maintenance  Topic Date Due   DTaP/Tdap/Td (1 - Tdap) Never done   Zoster Vaccines- Shingrix (1 of 2) Never done   Pneumonia Vaccine 21+ Years old (1 of 1 - PCV) Never done   COVID-19 Vaccine (5 - 2023-24 season) 04/15/2022   Medicare Annual Wellness (AWV)  03/12/2023   INFLUENZA VACCINE  03/16/2023   Colonoscopy  04/30/2026   DEXA SCAN  Completed   HPV VACCINES  Aged Out    Health Maintenance  Health Maintenance Due  Topic Date Due   DTaP/Tdap/Td (1 - Tdap) Never done  Zoster Vaccines- Shingrix (1 of 2) Never done   Pneumonia Vaccine 43+ Years old (1 of 1 - PCV) Never done   COVID-19 Vaccine (5 - 2023-24 season) 04/15/2022   Medicare Annual Wellness (AWV)  03/12/2023    Colorectal cancer screening: Type of screening: Colonoscopy. Completed 9/22. Repeat every 5 years Will leave up to GI doctor  Mammogram status: Completed 1/24. Repeat every year Will leave up the doctor  Bone Density status: Completed 2/23. Results reflect: Bone density results: OSTEOPOROSIS. Repeat every 2 years.  Lung Cancer Screening Does not qualify  Additional Screening:  Hepatitis C Screening: does not qualify; Completed NA age  Vision Screening: Recommended annual ophthalmology exams for early detection of glaucoma and other disorders of the eye. Is the patient up to date with their annual eye exam?  Yes  Who is the provider or what is the name of the office in which the patient attends annual eye exams? [facility] If pt is not established with a provider, would they like to be referred to a provider to establish care? No .   Dental Screening: Recommended annual dental exams for proper oral hygiene  Community Resource Referral / Chronic Care Management: CRR required this visit?  No   CCM  required this visit?  No     Plan:     I have personally reviewed and noted the following in the patient's chart:   Medical and social history Use of alcohol, tobacco or illicit drugs  Current medications and supplements including opioid prescriptions. Patient is not currently taking opioid prescriptions. Functional ability and status Nutritional status Physical activity Advanced directives List of other physicians Hospitalizations, surgeries, and ER visits in previous 12 months Vitals Screenings to include cognitive, depression, and falls Referrals and appointments  In addition, I have reviewed and discussed with patient certain preventive protocols, quality metrics, and best practice recommendations. A written personalized care plan for preventive services as well as general preventive health recommendations were provided to patient.     Sydell Axon, LPN   1/61/0960   After Visit Summary: (MyChart) Due to this being a telephonic visit, the after visit summary with patients personalized plan was offered to patient via MyChart   Nurse Notes: None

## 2023-03-13 NOTE — Patient Instructions (Signed)
Lindsey Foster , Thank you for taking time to come for your Medicare Wellness Visit. I appreciate your ongoing commitment to your health goals. Please review the following plan we discussed and let me know if I can assist you in the future.   Referrals/Orders/Follow-Ups/Clinician Recommendations: None  This is a list of the screening recommended for you and due dates:  Health Maintenance  Topic Date Due   DTaP/Tdap/Td vaccine (1 - Tdap) Never done   Zoster (Shingles) Vaccine (1 of 2) Never done   Pneumonia Vaccine (1 of 1 - PCV) Never done   COVID-19 Vaccine (5 - 2023-24 season) 04/15/2022   Flu Shot  03/16/2023   Medicare Annual Wellness Visit  03/12/2024   Colon Cancer Screening  04/30/2026   DEXA scan (bone density measurement)  Completed   HPV Vaccine  Aged Out    Advanced directives: (Declined) Advance directive discussed with you today. Even though you declined this today, please call our office should you change your mind, and we can give you the proper paperwork for you to fill out.  Next Medicare Annual Wellness Visit scheduled for next year: Yes 03/19/24 @ 11:15  Preventive Care 65 Years and Older, Female Preventive care refers to lifestyle choices and visits with your health care provider that can promote health and wellness. What does preventive care include? A yearly physical exam. This is also called an annual well check. Dental exams once or twice a year. Routine eye exams. Ask your health care provider how often you should have your eyes checked. Personal lifestyle choices, including: Daily care of your teeth and gums. Regular physical activity. Eating a healthy diet. Avoiding tobacco and drug use. Limiting alcohol use. Practicing safe sex. Taking low-dose aspirin every day. Taking vitamin and mineral supplements as recommended by your health care provider. What happens during an annual well check? The services and screenings done by your health care provider during  your annual well check will depend on your age, overall health, lifestyle risk factors, and family history of disease. Counseling  Your health care provider may ask you questions about your: Alcohol use. Tobacco use. Drug use. Emotional well-being. Home and relationship well-being. Sexual activity. Eating habits. History of falls. Memory and ability to understand (cognition). Work and work Astronomer. Reproductive health. Screening  You may have the following tests or measurements: Height, weight, and BMI. Blood pressure. Lipid and cholesterol levels. These may be checked every 5 years, or more frequently if you are over 33 years old. Skin check. Lung cancer screening. You may have this screening every year starting at age 70 if you have a 30-pack-year history of smoking and currently smoke or have quit within the past 15 years. Fecal occult blood test (FOBT) of the stool. You may have this test every year starting at age 65. Flexible sigmoidoscopy or colonoscopy. You may have a sigmoidoscopy every 5 years or a colonoscopy every 10 years starting at age 24. Hepatitis C blood test. Hepatitis B blood test. Sexually transmitted disease (STD) testing. Diabetes screening. This is done by checking your blood sugar (glucose) after you have not eaten for a while (fasting). You may have this done every 1-3 years. Bone density scan. This is done to screen for osteoporosis. You may have this done starting at age 40. Mammogram. This may be done every 1-2 years. Talk to your health care provider about how often you should have regular mammograms. Talk with your health care provider about your test results, treatment options,  and if necessary, the need for more tests. Vaccines  Your health care provider may recommend certain vaccines, such as: Influenza vaccine. This is recommended every year. Tetanus, diphtheria, and acellular pertussis (Tdap, Td) vaccine. You may need a Td booster every 10  years. Zoster vaccine. You may need this after age 28. Pneumococcal 13-valent conjugate (PCV13) vaccine. One dose is recommended after age 74. Pneumococcal polysaccharide (PPSV23) vaccine. One dose is recommended after age 41. Talk to your health care provider about which screenings and vaccines you need and how often you need them. This information is not intended to replace advice given to you by your health care provider. Make sure you discuss any questions you have with your health care provider. Document Released: 08/28/2015 Document Revised: 04/20/2016 Document Reviewed: 06/02/2015 Elsevier Interactive Patient Education  2017 [employer].  Fall Prevention in the Home Falls can cause injuries. They can happen to people of all ages. There are many things you can do to make your home safe and to help prevent falls. What can I do on the outside of my home? Regularly fix the edges of walkways and driveways and fix any cracks. Remove anything that might make you trip as you walk through a door, such as a raised step or threshold. Trim any bushes or trees on the path to your home. Use bright outdoor lighting. Clear any walking paths of anything that might make someone trip, such as rocks or tools. Regularly check to see if handrails are loose or broken. Make sure that both sides of any steps have handrails. Any raised decks and porches should have guardrails on the edges. Have any leaves, snow, or ice cleared regularly. Use sand or salt on walking paths during winter. Clean up any spills in your garage right away. This includes oil or grease spills. What can I do in the bathroom? Use night lights. Install grab bars by the toilet and in the tub and shower. Do not use towel bars as grab bars. Use non-skid mats or decals in the tub or shower. If you need to sit down in the shower, use a plastic, non-slip stool. Keep the floor dry. Clean up any water that spills on the floor as soon as it  happens. Remove soap buildup in the tub or shower regularly. Attach bath mats securely with double-sided non-slip rug tape. Do not have throw rugs and other things on the floor that can make you trip. What can I do in the bedroom? Use night lights. Make sure that you have a light by your bed that is easy to reach. Do not use any sheets or blankets that are too big for your bed. They should not hang down onto the floor. Have a firm chair that has side arms. You can use this for support while you get dressed. Do not have throw rugs and other things on the floor that can make you trip. What can I do in the kitchen? Clean up any spills right away. Avoid walking on wet floors. Keep items that you use a lot in easy-to-reach places. If you need to reach something above you, use a strong step stool that has a grab bar. Keep electrical cords out of the way. Do not use floor polish or wax that makes floors slippery. If you must use wax, use non-skid floor wax. Do not have throw rugs and other things on the floor that can make you trip. What can I do with my stairs? Do not leave  any items on the stairs. Make sure that there are handrails on both sides of the stairs and use them. Fix handrails that are broken or loose. Make sure that handrails are as long as the stairways. Check any carpeting to make sure that it is firmly attached to the stairs. Fix any carpet that is loose or worn. Avoid having throw rugs at the top or bottom of the stairs. If you do have throw rugs, attach them to the floor with carpet tape. Make sure that you have a light switch at the top of the stairs and the bottom of the stairs. If you do not have them, ask someone to add them for you. What else can I do to help prevent falls? Wear shoes that: Do not have high heels. Have rubber bottoms. Are comfortable and fit you well. Are closed at the toe. Do not wear sandals. If you use a stepladder: Make sure that it is fully opened.  Do not climb a closed stepladder. Make sure that both sides of the stepladder are locked into place. Ask someone to hold it for you, if possible. Clearly mark and make sure that you can see: Any grab bars or handrails. First and last steps. Where the edge of each step is. Use tools that help you move around (mobility aids) if they are needed. These include: Canes. Walkers. Scooters. Crutches. Turn on the lights when you go into a dark area. Replace any light bulbs as soon as they burn out. Set up your furniture so you have a clear path. Avoid moving your furniture around. If any of your floors are uneven, fix them. If there are any pets around you, be aware of where they are. Review your medicines with your doctor. Some medicines can make you feel dizzy. This can increase your chance of falling. Ask your doctor what other things that you can do to help prevent falls. This information is not intended to replace advice given to you by your health care provider. Make sure you discuss any questions you have with your health care provider. Document Released: 05/28/2009 Document Revised: 01/07/2016 Document Reviewed: 09/05/2014 Elsevier Interactive Patient Education  2017 [employer].

## 2023-03-14 ENCOUNTER — Encounter: Payer: Self-pay | Admitting: Family Medicine

## 2023-03-14 ENCOUNTER — Other Ambulatory Visit: Payer: Self-pay

## 2023-03-14 ENCOUNTER — Ambulatory Visit (INDEPENDENT_AMBULATORY_CARE_PROVIDER_SITE_OTHER): Payer: Medicare Other | Admitting: Family Medicine

## 2023-03-14 VITALS — BP 126/82 | HR 72 | Ht 67.0 in | Wt 179.0 lb

## 2023-03-14 DIAGNOSIS — M170 Bilateral primary osteoarthritis of knee: Secondary | ICD-10-CM | POA: Diagnosis not present

## 2023-03-14 NOTE — Patient Instructions (Signed)
Good to see you I wish I had the magic wand 6 week follow up

## 2023-03-14 NOTE — Assessment & Plan Note (Signed)
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  Patient is still adamant she wants to avoid any surgical intervention. Discussed with patient about potentially steroid injections every 3 months with intermittent viscosupplementation and the possibility of PRP.  Encouraged her to wear the braces on a more regular basis.  Patient is doing well with her weight at the moment.  Do think that that will be beneficial as well and it does make her a candidate for potential replacement if necessary.  We discussed with patient about proper shoes.  Will see patient back again in 6 to 8 weeks to discuss the potential for another injection

## 2023-03-15 ENCOUNTER — Encounter (INDEPENDENT_AMBULATORY_CARE_PROVIDER_SITE_OTHER): Payer: Self-pay

## 2023-04-04 ENCOUNTER — Encounter: Payer: Self-pay | Admitting: Family Medicine

## 2023-04-04 ENCOUNTER — Ambulatory Visit (INDEPENDENT_AMBULATORY_CARE_PROVIDER_SITE_OTHER): Payer: Medicare Other | Admitting: Family Medicine

## 2023-04-04 VITALS — BP 130/84 | HR 65 | Temp 98.3°F | Ht 67.0 in | Wt 179.0 lb

## 2023-04-04 DIAGNOSIS — E559 Vitamin D deficiency, unspecified: Secondary | ICD-10-CM

## 2023-04-04 DIAGNOSIS — I10 Essential (primary) hypertension: Secondary | ICD-10-CM

## 2023-04-04 DIAGNOSIS — R5383 Other fatigue: Secondary | ICD-10-CM | POA: Diagnosis not present

## 2023-04-04 DIAGNOSIS — E785 Hyperlipidemia, unspecified: Secondary | ICD-10-CM

## 2023-04-04 DIAGNOSIS — M79674 Pain in right toe(s): Secondary | ICD-10-CM | POA: Diagnosis not present

## 2023-04-04 DIAGNOSIS — M79675 Pain in left toe(s): Secondary | ICD-10-CM | POA: Diagnosis not present

## 2023-04-04 LAB — CBC WITH DIFFERENTIAL/PLATELET
Basophils Absolute: 0 10*3/uL (ref 0.0–0.1)
Basophils Relative: 1 % (ref 0.0–3.0)
Eosinophils Absolute: 0.1 10*3/uL (ref 0.0–0.7)
Eosinophils Relative: 2.5 % (ref 0.0–5.0)
HCT: 40.4 % (ref 36.0–46.0)
Hemoglobin: 12.7 g/dL (ref 12.0–15.0)
Lymphocytes Relative: 34.3 % (ref 12.0–46.0)
Lymphs Abs: 1.2 10*3/uL (ref 0.7–4.0)
MCHC: 31.5 g/dL (ref 30.0–36.0)
MCV: 85 fl (ref 78.0–100.0)
Monocytes Absolute: 0.5 10*3/uL (ref 0.1–1.0)
Monocytes Relative: 15 % — ABNORMAL HIGH (ref 3.0–12.0)
Neutro Abs: 1.7 10*3/uL (ref 1.4–7.7)
Neutrophils Relative %: 47.2 % (ref 43.0–77.0)
Platelets: 216 10*3/uL (ref 150.0–400.0)
RBC: 4.75 Mil/uL (ref 3.87–5.11)
RDW: 15.4 % (ref 11.5–15.5)
WBC: 3.5 10*3/uL — ABNORMAL LOW (ref 4.0–10.5)

## 2023-04-04 LAB — LIPID PANEL
Cholesterol: 234 mg/dL — ABNORMAL HIGH (ref 0–200)
HDL: 71.3 mg/dL (ref >39.00)
LDL Cholesterol: 143 mg/dL — ABNORMAL HIGH (ref 0–99)
NonHDL: 163
Total CHOL/HDL Ratio: 3
Triglycerides: 99 mg/dL (ref 0.0–149.0)
VLDL: 19.8 mg/dL (ref 0.0–40.0)

## 2023-04-04 LAB — TSH: TSH: 1.64 u[IU]/mL (ref 0.35–5.50)

## 2023-04-04 LAB — VITAMIN D 25 HYDROXY (VIT D DEFICIENCY, FRACTURES): VITD: 36.67 ng/mL (ref 30.00–100.00)

## 2023-04-04 LAB — VITAMIN B12: Vitamin B-12: 183 pg/mL — ABNORMAL LOW (ref 211–911)

## 2023-04-04 NOTE — Assessment & Plan Note (Signed)
Discussed is possible that she broke both toes.  At this point the left fourth toe is likely healed.  The right fourth toe still has some discomfort and thus she can buddy tape this for the next several weeks.  Discussed not taping at night.  If not improving over the next couple of weeks she will let us know.

## 2023-04-04 NOTE — Assessment & Plan Note (Signed)
Chronic issue.  Adequately controlled for age.  She will continue amlodipine 5 mg daily.

## 2023-04-04 NOTE — Progress Notes (Signed)
Marikay Alar, MD Phone: [phone]  Lindsey Foster is a 77 y.o. female who presents today for f/u.  HYPERTENSION Disease Monitoring Home BP Monitoring sporadically checks, 126-140/70-80 Chest pain- no    Dyspnea- no Medications Compliance-  taking amlodipine.  Edema- no BMET    Component Value Date/Time   NA 141 08/16/2022 1008   NA 142 07/04/2014 1041   K 4.2 08/16/2022 1008   K 3.9 07/04/2014 1041   CL 106 08/16/2022 1008   CL 108 (H) 07/04/2014 1041   CO2 28 08/16/2022 1008   CO2 30 07/04/2014 1041   GLUCOSE 99 08/16/2022 1008   GLUCOSE 86 07/04/2014 1041   BUN 19 08/16/2022 1008   BUN 12 07/04/2014 1041   CREATININE 1.22 (H) 08/16/2022 1008   CREATININE 1.18 07/04/2014 1041   CALCIUM 9.4 08/16/2022 1008   CALCIUM 8.8 07/04/2014 1041   GFRNONAA 45 (L) 08/16/2022 1008   GFRNONAA 48 (L) 07/04/2014 1041   GFRNONAA 42 (L) 07/04/2013 1048   GFRAA 50 (L) 04/13/2020 1243   GFRAA 58 (L) 07/04/2014 1041   GFRAA 49 (L) 07/04/2013 1048   Hyperlipidemia: Patient does water aerobics.  Due for lipid check.  Fatigue: Patient notes this occurs at the end of the day.  She just feels exhausted.  She stays busy throughout the day.  This has been going on for about 6 months.  Toe injury: Patient notes left fourth toe injury 2 months ago and a right fourth toe injury 2 weeks ago.  Left toe seems to have healed up and is no longer painful.  It is still little swollen.  The right fourth toe has a mild twinge of pain at times.  She did hit both of these before they started to hurt.  Social History   Tobacco Use  Smoking Status Former  Smokeless Tobacco Never    Current Outpatient Medications on File Prior to Visit  Medication Sig Dispense Refill   amLODipine (NORVASC) 5 MG tablet TAKE 1 TABLET(5 MG) BY MOUTH DAILY 90 tablet 1   Calcium Citrate-Vitamin D3 1000-400 LIQD Take 1 tablet by mouth daily. Reported on 02/04/2016     clobetasol cream (TEMOVATE) 0.05 % Apply  topically 2 (two) times daily. For 2 weeks, then just on week days as needed 60 g 1   clonazePAM (KLONOPIN) 0.5 MG tablet Take 0.5 tablets (0.25 mg total) by mouth once as needed for up to 1 dose for anxiety. Take prior to getting on the plane during travel 5 tablet 0   Crisaborole (EUCRISA) 2 % OINT Apply to affected skin once a day 60 g 2   famotidine (PEPCID) 20 MG tablet TAKE 1 TABLET(20 MG) BY MOUTH TWICE DAILY BEFORE A MEAL (Patient taking differently: TAKE 1 TABLET(20 MG) BY MOUTH TWICE DAILY BEFORE A MEAL as needed) 180 tablet 1   fluticasone (FLONASE SENSIMIST) 27.5 MCG/SPRAY nasal spray Place 2 sprays into the nose daily. 10 g 2   Garlic 100 MG TABS Take 1 tablet by mouth daily.      ketotifen (ZADITOR) 0.025 % ophthalmic solution As needed     loratadine (CLARITIN) 10 MG tablet Take 1 tablet (10 mg total) by mouth daily. 30 tablet 11   mometasone (ELOCON) 0.1 % lotion Apply topically daily.     No current facility-administered medications on file prior to visit.     ROS see history of present illness  Objective  Physical Exam Vitals:   04/04/23 1019  BP: 130/84  Pulse:  65  Temp: 98.3 F (36.8 C)  SpO2: 98%    BP Readings from Last 3 Encounters:  04/04/23 130/84  03/14/23 126/82  01/10/23 132/80   Wt Readings from Last 3 Encounters:  04/04/23 179 lb (81.2 kg)  03/14/23 179 lb (81.2 kg)  03/13/23 180 lb (81.6 kg)    Physical Exam Constitutional:      General: She is not in acute distress.    Appearance: She is not diaphoretic.  Cardiovascular:     Rate and Rhythm: Normal rate and regular rhythm.     Heart sounds: Normal heart sounds.  Pulmonary:     Effort: Pulmonary effort is normal.     Breath sounds: Normal breath sounds.  Musculoskeletal:     Comments: Slight swelling of the left fourth toe, nontender, right fourth toe with slight swelling and tenderness just proximal to the toenail, both toes have good range of motion  Skin:    General: Skin is warm  and dry.  Neurological:     Mental Status: She is alert.      Assessment/Plan: Please see individual problem list.  Essential hypertension Assessment & Plan: Chronic issue.  Adequately controlled for age.  She will continue amlodipine 5 mg daily.   Hyperlipidemia, unspecified hyperlipidemia type Assessment & Plan: Chronic issue.  Check lipid panel.  Orders: -     Lipid panel  Toe pain, bilateral Assessment & Plan: Discussed is possible that she broke both toes.  At this point the left fourth toe is likely healed.  The right fourth toe still has some discomfort and thus she can buddy tape this for the next several weeks.  Discussed not taping at night.  If not improving over the next couple of weeks she will let us know.   Other fatigue Assessment & Plan: Undetermined cause.  Discussed this could be related to the amount of activity she does during the day.  Does not seem to be cardiac in nature.  Will check lab work and evaluate further.  Orders: -     CBC with Differential/Platelet -     TSH -     Vitamin B12  Vitamin D deficiency -     VITAMIN D 25 Hydroxy (Vit-D Deficiency, Fractures)     Health Maintenance: Patient declines tetanus vaccine.  She declines pneumonia vaccine.  Discussed she could get the tetanus vaccine at the pharmacy if she would like.  She can get the pneumonia vaccine here if she would like to have this done in the future.  Return in about 3 months (around 07/05/2023).   Marikay Alar, MD [facility]

## 2023-04-04 NOTE — Assessment & Plan Note (Signed)
Undetermined cause.  Discussed this could be related to the amount of activity she does during the day.  Does not seem to be cardiac in nature.  Will check lab work and evaluate further.

## 2023-04-04 NOTE — Assessment & Plan Note (Signed)
Chronic issue.  Check lipid panel.

## 2023-04-05 ENCOUNTER — Telehealth: Payer: Self-pay

## 2023-04-05 NOTE — Telephone Encounter (Signed)
Patient states she is calling to schedule her B12 injections per Dr. Marikay Alar.  I scheduled appointments for her.  Patient states she wants to be sure Dr. Birdie Sons knows that she has scheduled these appointments.

## 2023-04-06 ENCOUNTER — Ambulatory Visit (INDEPENDENT_AMBULATORY_CARE_PROVIDER_SITE_OTHER): Payer: Medicare Other

## 2023-04-06 DIAGNOSIS — E538 Deficiency of other specified B group vitamins: Secondary | ICD-10-CM | POA: Diagnosis not present

## 2023-04-06 MED ORDER — CYANOCOBALAMIN 1000 MCG/ML IJ SOLN
1000.0000 ug | Freq: Once | INTRAMUSCULAR | Status: AC
  Administered 2023-04-06: 1000 ug via INTRAMUSCULAR

## 2023-04-06 NOTE — Progress Notes (Signed)
Patient arrived for a B12 injection and it was administered into her left deltoid. Patient tolerated the injection well and did not show any signs of distress or voice any concerns. 

## 2023-04-12 ENCOUNTER — Ambulatory Visit (INDEPENDENT_AMBULATORY_CARE_PROVIDER_SITE_OTHER): Payer: Medicare Other

## 2023-04-12 DIAGNOSIS — E538 Deficiency of other specified B group vitamins: Secondary | ICD-10-CM

## 2023-04-12 MED ORDER — CYANOCOBALAMIN 1000 MCG/ML IJ SOLN
1000.0000 ug | Freq: Once | INTRAMUSCULAR | Status: AC
  Administered 2023-04-12: 1000 ug via INTRAMUSCULAR

## 2023-04-12 NOTE — Progress Notes (Signed)
Patient presented for B 12 injection to left deltoid, patient voiced no concerns nor showed any signs of distress during injection. 

## 2023-04-20 ENCOUNTER — Ambulatory Visit (INDEPENDENT_AMBULATORY_CARE_PROVIDER_SITE_OTHER): Payer: Medicare Other

## 2023-04-20 DIAGNOSIS — E538 Deficiency of other specified B group vitamins: Secondary | ICD-10-CM

## 2023-04-20 MED ORDER — CYANOCOBALAMIN 1000 MCG/ML IJ SOLN
1000.0000 ug | Freq: Once | INTRAMUSCULAR | Status: AC
  Administered 2023-04-20: 1000 ug via INTRAMUSCULAR

## 2023-04-20 NOTE — Progress Notes (Signed)
Patient presented for B 12 injection to left deltoid due to history of breast cancer on the right side, patient voiced no concerns nor showed any signs of distress during injection.

## 2023-04-24 NOTE — Progress Notes (Unsigned)
[facility] [address] [place] [address] Phone: [phone] Subjective:   Lindsey Foster, am serving as a scribe for Dr. Antoine Primas.  I'm seeing this patient by the request  of:  [name], MD  CC: Knee pain right greater than left.  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  Patient is still adamant she wants to avoid any surgical intervention. Discussed with patient about potentially steroid injections every 3 months with intermittent viscosupplementation and the possibility of PRP.  Encouraged her to wear the braces on a more regular basis.  Patient is doing well with her weight at the moment.  Do think that that will be beneficial as well and it does make her a candidate for potential replacement if necessary.  We discussed with patient about proper shoes.  Will see patient back again in 6 to 8 weeks to discuss the potential for another injection  Update 04/25/2023 Lindsey Foster is a 77 y.o. female coming in with complaint of B knee pain. Patient states on and off pain. Pain with knee flexion past 90 degrees on anterior side.     Past Medical History:  Diagnosis Date   Allergic rhinitis    Anxiety    panic attacks in hot rooms   Arthritis    Blood in stool    Breast cancer (HCC) 2006   radiation - Rt   Chickenpox    CKD (chronic kidney disease)    Colon polyps    Diverticulosis    GERD (gastroesophageal reflux disease)    Heart murmur    Hyperlipidemia    Hypertension    Hypothyroidism    Multiple thyroid nodules    Osteopenia    Personal history of radiation therapy    Urinary incontinence    UTI (lower urinary tract infection)    Vitamin D deficiency disease    Past Surgical History:  Procedure Laterality Date   BLADDER SURGERY     BREAST EXCISIONAL BIOPSY Right 2006   positive   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   CESAREAN SECTION  1985   COLONOSCOPY N/A  04/30/2021   Procedure: COLONOSCOPY;  Surgeon: Regis Bill, MD;  Location: [facility] ENDOSCOPY;  Service: Endoscopy;  Laterality: N/A;   COLONOSCOPY WITH PROPOFOL N/A 09/28/2015   Procedure: COLONOSCOPY WITH PROPOFOL;  Surgeon: Christena Deem, MD;  Location: [facility] ENDOSCOPY;  Service: Endoscopy;  Laterality: N/A;   DILATION AND CURETTAGE OF UTERUS  2012   ESOPHAGOGASTRODUODENOSCOPY (EGD) WITH PROPOFOL N/A 08/23/2022   Procedure: ESOPHAGOGASTRODUODENOSCOPY (EGD) WITH PROPOFOL;  Surgeon: Regis Bill, MD;  Location: [facility] ENDOSCOPY;  Service: Endoscopy;  Laterality: N/A;   Thyroid nodule ablation  2003   Uterine polyp removal  2009    Social History   Socioeconomic History   Marital status: Widowed    Spouse name: Not on file   Number of children: Not on file   Years of education: Not on file   Highest education level: Not on file  Occupational History   Not on file  Tobacco Use   Smoking status: Former   Smokeless tobacco: Never  Vaping Use   Vaping status: Never Used  Substance and Sexual Activity   Alcohol use: No    Alcohol/week: 0.0 standard drinks of alcohol   Drug use: No   Sexual activity: Not on file  Other Topics Concern   Not on file  Social History Narrative  Not on file   Social Determinants of Health   Financial Resource Strain: Low Risk  (03/13/2023)   Overall Financial Resource Strain (CARDIA)    Difficulty of Paying Living Expenses: Not hard at all  Food Insecurity: No Food Insecurity (03/13/2023)   Hunger Vital Sign    Worried About Running Out of Food in the Last Year: Never true    Ran Out of Food in the Last Year: Never true  Transportation Needs: No Transportation Needs (03/13/2023)   PRAPARE - Administrator, Civil Service (Medical): No    Lack of Transportation (Non-Medical): No  Physical Activity: Sufficiently Active (03/13/2023)   Exercise Vital Sign    Days of Exercise per Week: 2 days    Minutes of Exercise per Session: 90  min  Stress: No Stress Concern Present (03/13/2023)   [employer] of Occupational Health - Occupational Stress Questionnaire    Feeling of Stress : Not at all  Social Connections: Moderately Integrated (03/13/2023)   Social Connection and Isolation Panel [NHANES]    Frequency of Communication with Friends and Family: More than three times a week    Frequency of Social Gatherings with Friends and Family: More than three times a week    Attends Religious Services: More than 4 times per year    Active Member of [employer] or Organizations: Yes    Attends Banker Meetings: More than 4 times per year    Marital Status: Widowed   Allergies  Allergen Reactions   Latex Rash   Family History  Problem Relation Age of Onset   Angina Father    Heart attack Father    Multiple myeloma Brother    Uterine cancer Maternal Aunt    Heart disease Other        Parent   Breast cancer Neg Hx      Current Outpatient Medications (Cardiovascular):    amLODipine (NORVASC) 5 MG tablet, TAKE 1 TABLET(5 MG) BY MOUTH DAILY  Current Outpatient Medications (Respiratory):    fluticasone (FLONASE SENSIMIST) 27.5 MCG/SPRAY nasal spray, Place 2 sprays into the nose daily.   loratadine (CLARITIN) 10 MG tablet, Take 1 tablet (10 mg total) by mouth daily.    Current Outpatient Medications (Other):    Calcium Citrate-Vitamin D3 1000-400 LIQD, Take 1 tablet by mouth daily. Reported on 02/04/2016   clobetasol cream (TEMOVATE) 0.05 %, Apply topically 2 (two) times daily. For 2 weeks, then just on week days as needed   clonazePAM (KLONOPIN) 0.5 MG tablet, Take 0.5 tablets (0.25 mg total) by mouth once as needed for up to 1 dose for anxiety. Take prior to getting on the plane during travel   Crisaborole (EUCRISA) 2 % OINT, Apply to affected skin once a day   famotidine (PEPCID) 20 MG tablet, TAKE 1 TABLET(20 MG) BY MOUTH TWICE DAILY BEFORE A MEAL (Patient taking differently: TAKE 1 TABLET(20 MG) BY MOUTH  TWICE DAILY BEFORE A MEAL as needed)   Garlic 100 MG TABS, Take 1 tablet by mouth daily.    ketotifen (ZADITOR) 0.025 % ophthalmic solution, As needed   mometasone (ELOCON) 0.1 % lotion, Apply topically daily.   Reviewed prior external information including notes and imaging from  primary care provider As well as notes that were available from care everywhere and other healthcare systems.  Past medical history, social, surgical and family history all reviewed in electronic medical record.  No pertanent information unless stated regarding to the chief complaint.   Review  of Systems:  No headache, visual changes, nausea, vomiting, diarrhea, constipation, dizziness, abdominal pain, skin rash, fevers, chills, night sweats, weight loss, swollen lymph nodes, body aches, joint swelling, chest pain, shortness of breath, mood changes. POSITIVE muscle aches  Objective  Blood pressure 126/74, pulse 83, height 5\' 7"  (1.702 m), weight 183 lb (83 kg), SpO2 95%.   General: No apparent distress alert and oriented x3 mood and affect normal, dressed appropriately.  HEENT: Pupils equal, extraocular movements intact  Respiratory: Patient's speak in full sentences and does not appear short of breath  Cardiovascular: No lower extremity edema, non tender, no erythema  Right knee exam shows the patient does have some crepitus noted.  Significant swelling noted.  Tender to palpation noted. Left knee does have varus deformity of the lower extremity.  Tender to palpation but more her flexion and 95 degrees compared to only 90 on the contralateral side.  After informed written and verbal consent, patient was seated on exam table. Right knee was prepped with alcohol swab and utilizing anterolateral approach, patient's right knee space was injected with 4:1  marcaine 0.5%: Kenalog 40mg /dL. Patient tolerated the procedure well without immediate complications.   Impression and Recommendations:    The above  documentation has been reviewed and is accurate and complete Judi Saa, DO

## 2023-04-25 ENCOUNTER — Encounter: Payer: Self-pay | Admitting: Family Medicine

## 2023-04-25 ENCOUNTER — Ambulatory Visit (INDEPENDENT_AMBULATORY_CARE_PROVIDER_SITE_OTHER): Payer: Medicare Other | Admitting: Family Medicine

## 2023-04-25 VITALS — BP 126/74 | HR 83 | Ht 67.0 in | Wt 183.0 lb

## 2023-04-25 DIAGNOSIS — M1711 Unilateral primary osteoarthritis, right knee: Secondary | ICD-10-CM | POA: Diagnosis not present

## 2023-04-25 DIAGNOSIS — M170 Bilateral primary osteoarthritis of knee: Secondary | ICD-10-CM

## 2023-04-25 NOTE — Patient Instructions (Addendum)
Injected knee today See me again in 2 months

## 2023-04-25 NOTE — Assessment & Plan Note (Signed)
Patient given injection and tolerated well, discussed icing regimen and home exercises, discussed which activities to do and which ones to avoid.  Increase activity slowly.  Patient responded well to the injection.  Follow-up again in 6 to 8 weeks otherwise.

## 2023-05-12 ENCOUNTER — Other Ambulatory Visit: Payer: Self-pay | Admitting: Family Medicine

## 2023-05-12 DIAGNOSIS — L309 Dermatitis, unspecified: Secondary | ICD-10-CM

## 2023-05-18 ENCOUNTER — Ambulatory Visit: Payer: Medicare Other

## 2023-05-22 ENCOUNTER — Ambulatory Visit (INDEPENDENT_AMBULATORY_CARE_PROVIDER_SITE_OTHER): Payer: Medicare Other

## 2023-05-22 DIAGNOSIS — E538 Deficiency of other specified B group vitamins: Secondary | ICD-10-CM | POA: Diagnosis not present

## 2023-05-22 MED ORDER — CYANOCOBALAMIN 1000 MCG/ML IJ SOLN
1000.0000 ug | Freq: Once | INTRAMUSCULAR | Status: AC
Start: 2023-05-22 — End: 2023-05-22
  Administered 2023-05-22: 1000 ug via INTRAMUSCULAR

## 2023-05-22 NOTE — Progress Notes (Signed)
After obtaining consent, and per orders of Dr. Birdie Sons, injection of B12 given IM in left deltoid.  Pt only receives injections in left arm due to history of breast cancer by Valentino Nose. Patient tolerated injection well.

## 2023-06-14 DIAGNOSIS — N1831 Chronic kidney disease, stage 3a: Secondary | ICD-10-CM | POA: Diagnosis not present

## 2023-06-22 DIAGNOSIS — I10 Essential (primary) hypertension: Secondary | ICD-10-CM | POA: Diagnosis not present

## 2023-06-22 DIAGNOSIS — N2581 Secondary hyperparathyroidism of renal origin: Secondary | ICD-10-CM | POA: Diagnosis not present

## 2023-06-22 DIAGNOSIS — N1831 Chronic kidney disease, stage 3a: Secondary | ICD-10-CM | POA: Diagnosis not present

## 2023-06-23 NOTE — Progress Notes (Unsigned)
[name] [address] [place] [address] Phone: [phone] Subjective:   Lindsey Foster, am serving as a scribe for Dr. Antoine Primas.  I'm seeing this patient by the request  of:  [name], MD  CC: bilateral knee pain   [id number]  04/25/2023 Patient given injection and tolerated well, discussed icing regimen and home exercises, discussed which activities to do and which ones to avoid.  Increase activity slowly.  Patient responded well to the injection.  Follow-up again in 6 to 8 weeks otherwise.    Update 06/27/2023 Lindsey Foster is a 77 y.o. female coming in with complaint of B knee pain. Patient states that both knees are doing better. The L knee remains worse than the R knee. Able to walk a couple miles.        Past Medical History:  Diagnosis Date   Allergic rhinitis    Anxiety    panic attacks in hot rooms   Arthritis    Blood in stool    Breast cancer (HCC) 2006   radiation - Rt   Chickenpox    CKD (chronic kidney disease)    Colon polyps    Diverticulosis    GERD (gastroesophageal reflux disease)    Heart murmur    Hyperlipidemia    Hypertension    Hypothyroidism    Multiple thyroid nodules    Osteopenia    Personal history of radiation therapy    Urinary incontinence    UTI (lower urinary tract infection)    Vitamin D deficiency disease    Past Surgical History:  Procedure Laterality Date   BLADDER SURGERY     BREAST EXCISIONAL BIOPSY Right 2006   positive   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   CESAREAN SECTION  1985   COLONOSCOPY N/A 04/30/2021   Procedure: COLONOSCOPY;  Surgeon: Regis Bill, MD;  Location: [facility] ENDOSCOPY;  Service: Endoscopy;  Laterality: N/A;   COLONOSCOPY WITH PROPOFOL N/A 09/28/2015   Procedure: COLONOSCOPY WITH PROPOFOL;  Surgeon: Christena Deem, MD;  Location: [facility] ENDOSCOPY;  Service: Endoscopy;  Laterality: N/A;   DILATION AND CURETTAGE OF UTERUS  2012    ESOPHAGOGASTRODUODENOSCOPY (EGD) WITH PROPOFOL N/A 08/23/2022   Procedure: ESOPHAGOGASTRODUODENOSCOPY (EGD) WITH PROPOFOL;  Surgeon: Regis Bill, MD;  Location: [facility] ENDOSCOPY;  Service: Endoscopy;  Laterality: N/A;   Thyroid nodule ablation  2003   Uterine polyp removal  2009    Social History   Socioeconomic History   Marital status: Widowed    Spouse name: Not on file   Number of children: Not on file   Years of education: Not on file   Highest education level: Not on file  Occupational History   Not on file  Tobacco Use   Smoking status: Former   Smokeless tobacco: Never  Vaping Use   Vaping status: Never Used  Substance and Sexual Activity   Alcohol use: No    Alcohol/week: 0.0 standard drinks of alcohol   Drug use: No   Sexual activity: Not on file  Other Topics Concern   Not on file  Social History Narrative   Not on file   Social Determinants of Health   Financial Resource Strain: Low Risk  (03/13/2023)   Overall Financial Resource Strain (CARDIA)    Difficulty of Paying Living Expenses: Not hard at all  Food Insecurity: No Food Insecurity (03/13/2023)   Hunger Vital Sign    Worried About Running Out  of Food in the Last Year: Never true    Ran Out of Food in the Last Year: Never true  Transportation Needs: No Transportation Needs (03/13/2023)   PRAPARE - Administrator, Civil Service (Medical): No    Lack of Transportation (Non-Medical): No  Physical Activity: Sufficiently Active (03/13/2023)   Exercise Vital Sign    Days of Exercise per Week: 2 days    Minutes of Exercise per Session: 90 min  Stress: No Stress Concern Present (03/13/2023)   [employer] of Occupational Health - Occupational Stress Questionnaire    Feeling of Stress : Not at all  Social Connections: Moderately Integrated (03/13/2023)   Social Connection and Isolation Panel [NHANES]    Frequency of Communication with Friends and Family: More than three times a week     Frequency of Social Gatherings with Friends and Family: More than three times a week    Attends Religious Services: More than 4 times per year    Active Member of [employer] or Organizations: Yes    Attends Banker Meetings: More than 4 times per year    Marital Status: Widowed   Allergies  Allergen Reactions   Latex Rash   Family History  Problem Relation Age of Onset   Angina Father    Heart attack Father    Multiple myeloma Brother    Uterine cancer Maternal Aunt    Heart disease Other        Parent   Breast cancer Neg Hx      Current Outpatient Medications (Cardiovascular):    amLODipine (NORVASC) 5 MG tablet, TAKE 1 TABLET(5 MG) BY MOUTH DAILY  Current Outpatient Medications (Respiratory):    fluticasone (FLONASE SENSIMIST) 27.5 MCG/SPRAY nasal spray, Place 2 sprays into the nose daily.   loratadine (CLARITIN) 10 MG tablet, Take 1 tablet (10 mg total) by mouth daily.    Current Outpatient Medications (Other):    Calcium Citrate-Vitamin D3 1000-400 LIQD, Take 1 tablet by mouth daily. Reported on 02/04/2016   clobetasol cream (TEMOVATE) 0.05 %, APPLY TOPICALLY TO THE AFFECTED AREA(S) TWICE DAILY FOR 2 WEEKS, THEN JUST ON WEEK DAYS AS NEEDED   clonazePAM (KLONOPIN) 0.5 MG tablet, Take 0.5 tablets (0.25 mg total) by mouth once as needed for up to 1 dose for anxiety. Take prior to getting on the plane during travel   Crisaborole (EUCRISA) 2 % OINT, Apply to affected skin once a day   famotidine (PEPCID) 20 MG tablet, TAKE 1 TABLET(20 MG) BY MOUTH TWICE DAILY BEFORE A MEAL (Patient taking differently: TAKE 1 TABLET(20 MG) BY MOUTH TWICE DAILY BEFORE A MEAL as needed)   Garlic 100 MG TABS, Take 1 tablet by mouth daily.    ketotifen (ZADITOR) 0.025 % ophthalmic solution, As needed   mometasone (ELOCON) 0.1 % lotion, Apply topically daily.    Objective  Blood pressure (!) 144/74, pulse 71, height 5\' 7"  (1.702 m), weight 185 lb (83.9 kg), SpO2 98%.   General: No  apparent distress alert and oriented x3 mood and affect normal, dressed appropriately.  HEENT: Pupils equal, extraocular movements intact  Respiratory: Patient's speak in full sentences and does not appear short of breath   Patient does have arthritic changes noted of the knees.  Overall seems to be doing well though.  Crepitus noted.    Impression and Recommendations:    The above documentation has been reviewed and is accurate and complete Judi Saa, DO

## 2023-06-27 ENCOUNTER — Encounter: Payer: Self-pay | Admitting: Family Medicine

## 2023-06-27 ENCOUNTER — Ambulatory Visit (INDEPENDENT_AMBULATORY_CARE_PROVIDER_SITE_OTHER): Payer: Medicare Other | Admitting: Family Medicine

## 2023-06-27 VITALS — BP 144/74 | HR 71 | Ht 67.0 in | Wt 185.0 lb

## 2023-06-27 DIAGNOSIS — M170 Bilateral primary osteoarthritis of knee: Secondary | ICD-10-CM | POA: Diagnosis not present

## 2023-06-27 NOTE — Patient Instructions (Signed)
So glad you're doing phenomenal Don't change anything See you again in 2 months

## 2023-06-27 NOTE — Assessment & Plan Note (Signed)
Patient is doing significantly better with the conservative therapy at this time.  Did well with the injections.  Continue to have her continue to work on core strengthening and continue to work on her weight but overall very happy with the result so far.

## 2023-07-04 ENCOUNTER — Encounter: Payer: Self-pay | Admitting: Family Medicine

## 2023-07-04 ENCOUNTER — Ambulatory Visit (INDEPENDENT_AMBULATORY_CARE_PROVIDER_SITE_OTHER): Payer: Medicare Other | Admitting: Family Medicine

## 2023-07-04 VITALS — BP 126/80 | HR 73 | Temp 98.2°F | Ht 67.0 in | Wt 183.8 lb

## 2023-07-04 DIAGNOSIS — Z23 Encounter for immunization: Secondary | ICD-10-CM

## 2023-07-04 DIAGNOSIS — E538 Deficiency of other specified B group vitamins: Secondary | ICD-10-CM | POA: Diagnosis not present

## 2023-07-04 DIAGNOSIS — K219 Gastro-esophageal reflux disease without esophagitis: Secondary | ICD-10-CM

## 2023-07-04 DIAGNOSIS — J309 Allergic rhinitis, unspecified: Secondary | ICD-10-CM

## 2023-07-04 DIAGNOSIS — L299 Pruritus, unspecified: Secondary | ICD-10-CM

## 2023-07-04 DIAGNOSIS — I10 Essential (primary) hypertension: Secondary | ICD-10-CM | POA: Diagnosis not present

## 2023-07-04 LAB — COMPREHENSIVE METABOLIC PANEL
ALT: 14 U/L (ref 0–35)
AST: 16 U/L (ref 0–37)
Albumin: 4.4 g/dL (ref 3.5–5.2)
Alkaline Phosphatase: 64 U/L (ref 39–117)
BUN: 18 mg/dL (ref 6–23)
CO2: 30 meq/L (ref 19–32)
Calcium: 10.1 mg/dL (ref 8.4–10.5)
Chloride: 103 meq/L (ref 96–112)
Creatinine, Ser: 1.25 mg/dL — ABNORMAL HIGH (ref 0.40–1.20)
GFR: 40.96 mL/min — ABNORMAL LOW (ref >60.00)
Glucose, Bld: 91 mg/dL (ref 70–99)
Potassium: 4.3 meq/L (ref 3.5–5.1)
Sodium: 140 meq/L (ref 135–145)
Total Bilirubin: 0.4 mg/dL (ref 0.2–1.2)
Total Protein: 7.3 g/dL (ref 6.0–8.3)

## 2023-07-04 LAB — VITAMIN B12: Vitamin B-12: 283 pg/mL (ref 211–911)

## 2023-07-04 MED ORDER — CYANOCOBALAMIN 1000 MCG/ML IJ SOLN
1000.0000 ug | Freq: Once | INTRAMUSCULAR | Status: AC
  Administered 2023-07-04: 1000 ug via INTRAMUSCULAR

## 2023-07-04 MED ORDER — LORATADINE 10 MG PO TABS: 10.0000 mg | ORAL_TABLET | Freq: Every day | ORAL | 2 refills | Status: AC

## 2023-07-04 NOTE — Assessment & Plan Note (Signed)
Patient will continue B12 injections monthly.  B12 will be checked today.  Will also check an intrinsic factor.

## 2023-07-04 NOTE — Assessment & Plan Note (Signed)
Chronic issue.  Likely related to an allergic issue.  She can continue Claritin.  Will check liver enzymes.

## 2023-07-04 NOTE — Progress Notes (Signed)
Marikay Alar, MD Phone: [phone]  Lindsey Foster is a 77 y.o. female who presents today for f/u.  HYPERTENSION Disease Monitoring Home BP Monitoring 120s-140/70s Chest pain- see below    Dyspnea- see below Medications Compliance-  taking amlodipine.   BMET    Component Value Date/Time   NA 141 08/16/2022 1008   NA 142 07/04/2014 1041   K 4.2 08/16/2022 1008   K 3.9 07/04/2014 1041   CL 106 08/16/2022 1008   CL 108 (H) 07/04/2014 1041   CO2 28 08/16/2022 1008   CO2 30 07/04/2014 1041   GLUCOSE 99 08/16/2022 1008   GLUCOSE 86 07/04/2014 1041   BUN 19 08/16/2022 1008   BUN 12 07/04/2014 1041   CREATININE 1.22 (H) 08/16/2022 1008   CREATININE 1.18 07/04/2014 1041   CALCIUM 9.4 08/16/2022 1008   CALCIUM 8.8 07/04/2014 1041   GFRNONAA 45 (L) 08/16/2022 1008   GFRNONAA 48 (L) 07/04/2014 1041   GFRNONAA 42 (L) 07/04/2013 1048   GFRAA 50 (L) 04/13/2020 1243   GFRAA 58 (L) 07/04/2014 1041   GFRAA 49 (L) 07/04/2013 1048   GERD:   Reflux symptoms: no burning, no sour taste, see below   Abd pain: no   Blood in stool: no  Dysphagia: no   EGD: earlier this year  Medication: pepcid prn  Chest pain: Patient reports an episode of chest discomfort last night lasting 3 to 4 minutes.  It occurred when she was getting in bed.  She had chili for dinner.  Notes some radiation up to her neck.  No shortness of breath or diaphoresis with this.  Notes she occasionally will have these symptoms after eating tomato-based foods.  She does note she still gets some shortness of breath symptoms when exerting herself after eating.  She does follow with cardiology.  She has had a stress echo and echo in the past for similar symptoms.  Itching: Patient reports chronic issues with itching that responds to Claritin.  Notes the itching typically occurs where she has a tag on her close.   Social History   Tobacco Use  Smoking Status Former  Smokeless Tobacco Never    Current Outpatient  Medications on File Prior to Visit  Medication Sig Dispense Refill   amLODipine (NORVASC) 5 MG tablet TAKE 1 TABLET(5 MG) BY MOUTH DAILY 90 tablet 1   Calcium Citrate-Vitamin D3 1000-400 LIQD Take 1 tablet by mouth daily. Reported on 02/04/2016     clobetasol cream (TEMOVATE) 0.05 % APPLY TOPICALLY TO THE AFFECTED AREA(S) TWICE DAILY FOR 2 WEEKS, THEN JUST ON WEEK DAYS AS NEEDED 60 g 1   clonazePAM (KLONOPIN) 0.5 MG tablet Take 0.5 tablets (0.25 mg total) by mouth once as needed for up to 1 dose for anxiety. Take prior to getting on the plane during travel 5 tablet 0   Crisaborole (EUCRISA) 2 % OINT Apply to affected skin once a day 60 g 2   famotidine (PEPCID) 20 MG tablet TAKE 1 TABLET(20 MG) BY MOUTH TWICE DAILY BEFORE A MEAL (Patient taking differently: TAKE 1 TABLET(20 MG) BY MOUTH TWICE DAILY BEFORE A MEAL as needed) 180 tablet 1   fluticasone (FLONASE SENSIMIST) 27.5 MCG/SPRAY nasal spray Place 2 sprays into the nose daily. 10 g 2   Garlic 100 MG TABS Take 1 tablet by mouth daily.      ketotifen (ZADITOR) 0.025 % ophthalmic solution As needed     mometasone (ELOCON) 0.1 % lotion Apply topically daily.  No current facility-administered medications on file prior to visit.     ROS see history of present illness  Objective  Physical Exam Vitals:   07/04/23 1024  BP: 126/80  Pulse: 73  Temp: 98.2 F (36.8 C)  SpO2: 97%    BP Readings from Last 3 Encounters:  07/04/23 126/80  06/27/23 (!) 144/74  04/25/23 126/74   Wt Readings from Last 3 Encounters:  07/04/23 183 lb 12.8 oz (83.4 kg)  06/27/23 185 lb (83.9 kg)  04/25/23 183 lb (83 kg)    Physical Exam Constitutional:      General: She is not in acute distress.    Appearance: She is not diaphoretic.  Cardiovascular:     Rate and Rhythm: Normal rate and regular rhythm.     Heart sounds: Normal heart sounds.  Pulmonary:     Effort: Pulmonary effort is normal.     Breath sounds: Normal breath sounds.  Skin:     General: Skin is warm and dry.  Neurological:     Mental Status: She is alert.      Assessment/Plan: Please see individual problem list.  Essential hypertension Assessment & Plan: Chronic issue.  Adequately controlled for age.  Patient will continue amlodipine 5 mg daily.  Orders: -     Comprehensive metabolic panel  Allergic rhinitis, unspecified seasonality, unspecified trigger -     Loratadine; Take 1 tablet (10 mg total) by mouth daily.  Dispense: 30 tablet; Refill: 2  B12 deficiency Assessment & Plan: Patient will continue B12 injections monthly.  B12 will be checked today.  Will also check an intrinsic factor.  Orders: -     Vitamin B12 -     Intrinsic Factor Antibodies -     Cyanocobalamin  Gastroesophageal reflux disease, unspecified whether esophagitis present Assessment & Plan: Chronic issue.  Suspect patient's discomfort is related to reflux given that this is a recurrent issue with certain foods.  She has seen cardiology consistently for several years and has had multiple stress test for similar symptoms.  Those stress test were reassuring.  Discussed use of Pepcid over-the-counter 30 to 60 minutes before dinner to see if that is helpful.  Advised to seek medical attention for any persistent chest pain.   Itching Assessment & Plan: Chronic issue.  Likely related to an allergic issue.  She can continue Claritin.  Will check liver enzymes.   Encounter for administration of vaccine -     Flu Vaccine Trivalent High Dose (Fluad)    Return in about 3 months (around 10/04/2023) for Transfer of care.   Marikay Alar, MD [facility]

## 2023-07-04 NOTE — Assessment & Plan Note (Signed)
Chronic issue.  Suspect patient's discomfort is related to reflux given that this is a recurrent issue with certain foods.  She has seen cardiology consistently for several years and has had multiple stress test for similar symptoms.  Those stress test were reassuring.  Discussed use of Pepcid over-the-counter 30 to 60 minutes before dinner to see if that is helpful.  Advised to seek medical attention for any persistent chest pain.

## 2023-07-04 NOTE — Assessment & Plan Note (Signed)
Chronic issue.  Adequately controlled for age.  Patient will continue amlodipine 5 mg daily.

## 2023-07-08 LAB — INTRINSIC FACTOR ANTIBODIES: Intrinsic Factor: NEGATIVE

## 2023-07-27 ENCOUNTER — Ambulatory Visit: Payer: Medicare Other

## 2023-07-27 ENCOUNTER — Telehealth: Payer: Self-pay | Admitting: Family Medicine

## 2023-07-27 DIAGNOSIS — E538 Deficiency of other specified B group vitamins: Secondary | ICD-10-CM

## 2023-07-27 MED ORDER — CYANOCOBALAMIN 1000 MCG/ML IJ SOLN
1000.0000 ug | Freq: Once | INTRAMUSCULAR | Status: AC
  Administered 2023-07-27: 1000 ug via INTRAMUSCULAR

## 2023-07-27 NOTE — Progress Notes (Signed)
Patient presented for B 12 injection to left deltoid, patient voiced no concerns nor showed any signs of distress during injection. 

## 2023-07-27 NOTE — Telephone Encounter (Signed)
Pt came for her B12 appointment today and want to know if she need to continue the treatments

## 2023-07-27 NOTE — Telephone Encounter (Signed)
Spoke to pt. Informed pt she is doing monthly injections. Scheduled next b12 for 08/28/23

## 2023-08-08 ENCOUNTER — Ambulatory Visit
Admission: EM | Admit: 2023-08-08 | Discharge: 2023-08-08 | Disposition: A | Discharge status: Home / Self Care | Payer: Medicare Other | Attending: Emergency Medicine | Admitting: Emergency Medicine

## 2023-08-08 ENCOUNTER — Ambulatory Visit: Payer: Self-pay | Admitting: Family Medicine

## 2023-08-08 ENCOUNTER — Telehealth: Payer: Self-pay

## 2023-08-08 DIAGNOSIS — R102 Pelvic and perineal pain unspecified side: Secondary | ICD-10-CM

## 2023-08-08 LAB — WET PREP, GENITAL
Clue Cells Wet Prep HPF POC: NONE SEEN
Sperm: NONE SEEN
Trich, Wet Prep: NONE SEEN
WBC, Wet Prep HPF POC: <10 — AB (ref <10)
Yeast Wet Prep HPF POC: NONE SEEN

## 2023-08-08 NOTE — Discharge Instructions (Addendum)
Please keep your appt with PCP as scheduled Thursday, you may need referral to GYN if symptoms persist. No visible prolapse noted, no discharge. Your wet prep was negative for BV and yeast. Drink plenty of water. If you develop pain, fever, or worsening symptoms you will need to go to the emergency room for further evaluation.

## 2023-08-08 NOTE — Telephone Encounter (Signed)
Called Patient with Dr. Purvis Sheffield recommendation and even looked up the UC in [place] and gave the Patient the phone number. Explained that you do not need an appointment for UC that you walk in , give your name and DOB and details of what is going on and they will see & treat you.

## 2023-08-08 NOTE — ED Triage Notes (Addendum)
Patient states that she had a pimple type bump on her vagina that popped 7-10 days ago. She states that the opening of her vaginal feels like something is in there. Pressure. Hx of Hemorid.  Patients dr told her today to come to UC because it could be something urgent.

## 2023-08-08 NOTE — Telephone Encounter (Signed)
  Chief Complaint: vaginal swelling, tightness, red spot Symptoms: vaginal tightness, red spot, swelliing Frequency: constant for last two weeks Pertinent Negatives: Patient denies fever, painful urination, itching Disposition: [] ED /[] Urgent Care (no appt availability in office) / [x] Appointment(In office/virtual)/ []  [place] [facility]/ [] Home Care/ [] Refused Recommended Disposition /[] [place] [facility]/ []  Follow-up with PCP Additional Notes: Patient called and states she has vaginal swelling/tightness feeling. Described it comparative to having a tampon in. Stated she also noticed a red spot near the vaginal opening but states "she cannot fully see down there". Denies any pain, denies fever, denies painful urination. Patient states she has been on amoxicillin on and off for 3-4 weeks for prophylaxis for a dental procedure.    Copied from CRM [id number]. Topic: Clinical - Red Word Triage >> Aug 08, 2023  9:49 AM Theodis Sato wrote: Reason for CRM: Possible UTI but PT states that there is swelling in her vaginal area. Reason for Disposition  Tender lump (swelling or "ball") at vaginal opening  Answer Assessment - Initial Assessment Questions 1. SYMPTOM: "What's the main symptom you're concerned about?" (e.g., pain, itching, dryness)     Two weeks 2. LOCATION: "Where is the  located?" (e.g., inside/outside, left/right)     Outside, red area on the left side 3. ONSET: "When did this start?     About two weeks ago 4. PAIN: "Is there any pain?" If Yes, ask: "How bad is it?" (Scale: 1-10; mild, moderate, severe)   -  MILD (1-3): Doesn't interfere with normal activities.    -  MODERATE (4-7): Interferes with normal activities (e.g., work or school) or awakens from sleep.     -  SEVERE (8-10): Excruciating pain, unable to do any normal activities.     No pain 5. ITCHING: "Is there any itching?" If Yes, ask: "How bad is it?" (Scale: 1-10; mild, moderate, severe)     No 6. CAUSE: "What do  you think is causing the discharge?" "Have you had the same problem before? What happened then?"     "Yesterday I had white fluid but I used a cream for dryness" 7. OTHER SYMPTOMS: "Do you have any other symptoms?" (e.g., fever, itching, vaginal bleeding, pain with urination, injury to genital area, vaginal foreign body)     Tightness feeling 'feels like I have a tampon in', a reddish spot on vaginal opening, a tiny amount of blood from that red spot  Protocols used: Vaginal Symptoms-A-AH

## 2023-08-08 NOTE — ED Provider Notes (Signed)
[facility]    CSN: [id number] Arrival date & time: 08/08/23  1242      History   Chief Complaint Chief Complaint  Patient presents with   vaginal problem    HPI Lindsey Foster is a 77 y.o. female.   77 year old female, Lindsey Foster, presents to urgent care for evaluation of vaginal pain that she has had for 3 weeks after taking antibiotic.  Patient states that she had some type of bump on her vagina that popped 10 days ago, has since resolved.  Patient reports pressure in vaginal area was advised by her PCP that they could see her Thursday but it could be something urgent to be evaluated at this facility.  She denies any dysuria ,fever, nausea, orvomiting  Past medical history of hemorrhoid, chronic kidney disease, urinary incontinence, arthritis, diverticulosis, hypertension, hyperlipidemia, hypothyroidism, anxiety  The history is provided by the patient. No language interpreter was used.    Past Medical History:  Diagnosis Date   Allergic rhinitis    Anxiety    panic attacks in hot rooms   Arthritis    Blood in stool    Breast cancer (HCC) 2006   radiation - Rt   Chickenpox    CKD (chronic kidney disease)    Colon polyps    Diverticulosis    GERD (gastroesophageal reflux disease)    Heart murmur    Hyperlipidemia    Hypertension    Hypothyroidism    Multiple thyroid nodules    Osteopenia    Personal history of radiation therapy    Urinary incontinence    UTI (lower urinary tract infection)    Vitamin D deficiency disease     Patient Active Problem List   Diagnosis Date Noted   Vaginal pain 08/08/2023   B12 deficiency 07/04/2023   Fatigue 04/04/2023   Neck pain on left side 10/13/2022   Left shoulder pain 08/30/2022   Hemorrhoids 05/17/2022   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   Itching 02/08/2022   Taste sensation disturbance 02/08/2022   Skin breakdown 11/09/2021   Submandibular gland swelling 11/09/2021   Cerebral microvascular  disease 08/03/2021   Skin lesion 08/03/2021   New onset of headaches after age 68 05/10/2021   Constipation 11/06/2020   Right low back pain 11/06/2020   Umbilical hernia 05/09/2020   Arthritis of left ankle 11/13/2019   Bilateral leg edema 11/06/2019   Left foot pain 11/06/2019   Hot flashes 11/06/2019   Stage 3 chronic kidney disease (HCC) 06/23/2019   Vitamin D deficiency 06/23/2019   Degenerative arthritis of knee 02/17/2019   Vertigo 02/17/2019   Pain in toes of both feet 02/17/2019   Costochondritis 08/01/2017   Urge incontinence 03/16/2017   Allergic rhinitis 08/12/2016   Overweight 08/12/2016   Shortness of breath when humid outside 02/04/2016   History of thyroid nodule 02/04/2016   Anxiety 11/01/2015   Hyperlipidemia 07/30/2015   Essential hypertension 07/30/2015   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   Stiffness of left knee 07/30/2015   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   Osteoporosis 07/20/2015   GERD (gastroesophageal reflux disease) 06/30/2015   Eczema 06/30/2015   Blood in stool 06/30/2015   Muscle cramping 06/30/2015   Melena 06/30/2015   Kidney disease 07/07/2014   Nonexertional chest pain 07/07/2014   Palpitation 07/07/2014    Past Surgical History:  Procedure Laterality Date   BLADDER SURGERY     BREAST EXCISIONAL BIOPSY Right 2006   positive   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   CESAREAN SECTION  1985   COLONOSCOPY N/A 04/30/2021   Procedure: COLONOSCOPY;  Surgeon: Regis Bill, MD;  Location: [facility] ENDOSCOPY;  Service: Endoscopy;  Laterality: N/A;   COLONOSCOPY WITH PROPOFOL N/A 09/28/2015   Procedure: COLONOSCOPY WITH PROPOFOL;  Surgeon: Christena Deem, MD;  Location: [facility] ENDOSCOPY;  Service: Endoscopy;  Laterality: N/A;   DILATION AND CURETTAGE OF UTERUS  2012   ESOPHAGOGASTRODUODENOSCOPY (EGD) WITH PROPOFOL N/A 08/23/2022   Procedure: ESOPHAGOGASTRODUODENOSCOPY (EGD) WITH PROPOFOL;  Surgeon: Regis Bill, MD;  Location: [facility] ENDOSCOPY;   Service: Endoscopy;  Laterality: N/A;   Thyroid nodule ablation  2003   Uterine polyp removal  2009     OB History   No obstetric history on file.      Home Medications    Prior to Admission medications   Medication Sig Start Date End Date Taking? Authorizing Provider  amLODipine (NORVASC) 5 MG tablet TAKE 1 TABLET(5 MG) BY MOUTH DAILY 12/09/22  Yes [name], MD  Calcium Citrate-Vitamin D3 1000-400 LIQD Take 1 tablet by mouth daily. Reported on 02/04/2016   Yes [provider]  clobetasol cream (TEMOVATE) 0.05 % APPLY TOPICALLY TO THE AFFECTED AREA(S) TWICE DAILY FOR 2 WEEKS, THEN JUST ON WEEK DAYS AS NEEDED 05/12/23  Yes [name], MD  clonazePAM (KLONOPIN) 0.5 MG tablet Take 0.5 tablets (0.25 mg total) by mouth once as needed for up to 1 dose for anxiety. Take prior to getting on the plane during travel 02/08/22  Yes [name], MD  Crisaborole (EUCRISA) 2 % OINT Apply to affected skin once a day 05/04/22  Yes Deirdre Evener, MD  famotidine (PEPCID) 20 MG tablet TAKE 1 TABLET(20 MG) BY MOUTH TWICE DAILY BEFORE A MEAL Patient taking differently: TAKE 1 TABLET(20 MG) BY MOUTH TWICE DAILY BEFORE A MEAL as needed 05/10/21  Yes [name], MD  fluticasone (FLONASE SENSIMIST) 27.5 MCG/SPRAY nasal spray Place 2 sprays into the nose daily. 11/09/21  Yes [name], MD  Garlic 100 MG TABS Take 1 tablet by mouth daily.    Yes [provider]  ketotifen (ZADITOR) 0.025 % ophthalmic solution As needed   Yes [provider]  loratadine (CLARITIN) 10 MG tablet Take 1 tablet (10 mg total) by mouth daily. 07/04/23  Yes [name], MD  mometasone (ELOCON) 0.1 % lotion Apply topically daily.   Yes [provider]    Family History Family History  Problem Relation Age of Onset   Angina Father    Heart attack Father    Multiple myeloma Brother    Uterine cancer Maternal Aunt    Heart disease Other        Parent    Breast cancer Neg Hx     Social History Social History   Tobacco Use   Smoking status: Former   Smokeless tobacco: Never  Advertising account planner   Vaping status: Never Used  Substance Use Topics   Alcohol use: No    Alcohol/week: 0.0 standard drinks of alcohol   Drug use: No     Allergies   Latex   Review of Systems Review of Systems  Constitutional:  Negative for fever.  Genitourinary:  Positive for vaginal pain. Negative for dysuria, genital sores, vaginal bleeding and vaginal discharge.  All other systems reviewed and are negative.    Physical Exam Triage Vital Signs ED Triage Vitals  Encounter Vitals Group     BP 08/08/23 1336 (!) 148/65  Systolic BP Percentile --      Diastolic BP Percentile --      Pulse Rate 08/08/23 1336 61     Resp 08/08/23 1336 19     Temp 08/08/23 1336 98.2 F (36.8 C)     Temp Source 08/08/23 1336 Oral     SpO2 08/08/23 1336 98 %     Weight --      Height --      Head Circumference --      Peak Flow --      Pain Score 08/08/23 1335 1     Pain Loc --      Pain Education --      Exclude from Growth Chart --    No data found.  Updated Vital Signs BP (!) 148/65 (BP Location: Left Arm)   Pulse 61   Temp 98.2 F (36.8 C) (Oral)   Resp 19   SpO2 98%   Visual Acuity Right Eye Distance:   Left Eye Distance:   Bilateral Distance:    Right Eye Near:   Left Eye Near:    Bilateral Near:     Physical Exam Vitals and nursing note reviewed. Exam conducted with a chaperone present.  Constitutional:      General: She is not in acute distress.    Appearance: She is well-developed and well-groomed.  HENT:     Head: Normocephalic and atraumatic.  Eyes:     Conjunctiva/sclera: Conjunctivae normal.  Cardiovascular:     Rate and Rhythm: Normal rate and regular rhythm.     Heart sounds: No murmur heard. Pulmonary:     Effort: Pulmonary effort is normal. No respiratory distress.     Breath sounds: Normal breath sounds.  Abdominal:      Palpations: Abdomen is soft.     Tenderness: There is no abdominal tenderness.  Genitourinary:    Labia:        Right: No rash, tenderness, lesion or injury.        Left: No rash, tenderness, lesion or injury.      Urethra: No prolapse, urethral pain, urethral swelling or urethral lesion.  Musculoskeletal:        General: No swelling.     Cervical back: Neck supple.  Skin:    General: Skin is warm and dry.     Capillary Refill: Capillary refill takes less than 2 seconds.  Neurological:     General: No focal deficit present.     Mental Status: She is alert and oriented to person, place, and time.     GCS: GCS eye subscore is 4. GCS verbal subscore is 5. GCS motor subscore is 6.     Cranial Nerves: No cranial nerve deficit.     Sensory: No sensory deficit.  Psychiatric:        Mood and Affect: Mood normal.        Behavior: Behavior is cooperative.      UC Treatments / Results  Labs (all labs ordered are listed, but only abnormal results are displayed) Labs Reviewed  WET PREP, GENITAL - Abnormal; Notable for the following components:      Result Value   WBC, Wet Prep HPF POC <10 (*)    All other components within normal limits    EKG   Radiology No results found.  Procedures Procedures (including critical care time)  Medications Ordered in UC Medications - No data to display  Initial Impression / Assessment and Plan / UC Course  I have reviewed the triage vital signs and the nursing notes.  Pertinent labs & imaging results that were available during my care of the patient were reviewed by me and considered in my medical decision making (see chart for details).    Discussed exam findings and plan of care with patient, go to ER precautions given, will with PCP in 2 days, patient verbalized understanding this provider  Ddx: Vaginal pain, bladder/uterine prolapse, UTI, anxiety Final Clinical Impressions(s) / UC Diagnoses   Final diagnoses:  Vaginal pain      Discharge Instructions      Please keep your appt with PCP as scheduled Thursday, you may need referral to GYN if symptoms persist. No visible prolapse noted, no discharge. Your wet prep was negative for BV and yeast. Drink plenty of water. If you develop pain, fever, or worsening symptoms you will need to go to the emergency room for further evaluation.      ED Prescriptions   None    PDMP not reviewed this encounter.   Clancy Gourd, NP 08/08/23 1455

## 2023-08-08 NOTE — Telephone Encounter (Signed)
Let Patient know to keep the appointment in case UC states she needs a follow up but if UC says she does not need to see [place] then she can call on 08/10/23 and cancel the appointment. Patient is agreeable.

## 2023-08-08 NOTE — Telephone Encounter (Signed)
Copied from CRM [id number]. Topic: Appointments - Scheduling Inquiry for Clinic >> Aug 08, 2023 11:43 AM Prudencio Pair wrote: Reason for CRM: Patient states her pcp called & told her to go to [facility] today and to not wait until Thursday. Patient wants to know if she should keep the appt for Thursday even after she goes  to urgent care. Advised to pt that it would be up to her. She can cancel or keep it. Pt would like for nurse or pcp to give her a call back in regards to this issue. CB #: [phone].

## 2023-08-08 NOTE — Telephone Encounter (Signed)
I would suggest she get evaluated for this today and not wait until Thursday for the appointment that was scheduled. It is possible she has an abscess that would benefit from treatment today. She could go to urgent care or [facility] for evaluation given there are not any available appointments.

## 2023-08-10 ENCOUNTER — Encounter: Payer: Self-pay | Admitting: Family Medicine

## 2023-08-10 ENCOUNTER — Ambulatory Visit (INDEPENDENT_AMBULATORY_CARE_PROVIDER_SITE_OTHER): Payer: Medicare Other | Admitting: Family Medicine

## 2023-08-10 VITALS — BP 132/64 | HR 62 | Temp 98.0°F | Resp 18 | Ht 67.0 in | Wt 183.2 lb

## 2023-08-10 DIAGNOSIS — N952 Postmenopausal atrophic vaginitis: Secondary | ICD-10-CM | POA: Diagnosis not present

## 2023-08-10 DIAGNOSIS — R350 Frequency of micturition: Secondary | ICD-10-CM

## 2023-08-10 DIAGNOSIS — R8290 Unspecified abnormal findings in urine: Secondary | ICD-10-CM | POA: Diagnosis not present

## 2023-08-10 DIAGNOSIS — K649 Unspecified hemorrhoids: Secondary | ICD-10-CM

## 2023-08-10 LAB — URINALYSIS, ROUTINE W REFLEX MICROSCOPIC
Bilirubin Urine: NEGATIVE
Ketones, ur: NEGATIVE
Leukocytes,Ua: NEGATIVE
Nitrite: NEGATIVE
Specific Gravity, Urine: 1.02 (ref 1.000–1.030)
Total Protein, Urine: NEGATIVE
Urine Glucose: NEGATIVE
Urobilinogen, UA: 0.2 (ref 0.0–1.0)
pH: 7 (ref 5.0–8.0)

## 2023-08-10 LAB — POC URINALSYSI DIPSTICK (AUTOMATED)
Bilirubin, UA: NEGATIVE
Glucose, UA: NEGATIVE
Ketones, UA: NEGATIVE
Leukocytes, UA: NEGATIVE
Nitrite, UA: NEGATIVE
Protein, UA: NEGATIVE
Spec Grav, UA: 1.02 (ref 1.010–1.025)
Urobilinogen, UA: 0.2 U/dL
pH, UA: 7 (ref 5.0–8.0)

## 2023-08-10 NOTE — Progress Notes (Signed)
SUBJECTIVE:   Chief Complaint  Patient presents with   Groin Swelling    X 2 weeks bleeding in the vagina, but had went away.PT seen a bump on the side of vagina.   HPI Presents for acute visit  Discussed the use of AI scribe software for clinical note transcription with the patient, who gave verbal consent to proceed.  History of Present Illness Miss Steeples, an 77 year old patient with a history of hemorrhoids and recent antibiotic use, presents with a vaginal issue that has been progressively worsening. Approximately 10 days ago, the patient noticed some vaginal bleeding and a small red spot on the side of the vaginal opening. The patient assumed it was a yeast infection, possibly related to recent courses of amoxicillin for dental issues in mid-October and early December.  The patient self-treated with leftover Diflucan (ketoconazole), which seemed to provide some relief but did not completely resolve the issue. The patient describes a persistent sensation of irritation, likened to the feeling of a tampon string in the vaginal opening. This discomfort appears to be exacerbated by prolonged standing and somewhat relieved by sitting and rest.  The patient attempted sitz baths with vinegar for relief, an old-school remedy for yeast infections. The patient also reports urinary frequency and slight incontinence, but denies dysuria or hematuria. The patient has not been sexually active for several years and has not seen a gynecologist since turning 72, as advised. The patient still has a uterus.  The patient also reports ongoing issues with hemorrhoids and constipation, which have been addressed with dietary changes and MiraLAX by GI doctors. The patient had a colonoscopy in September 2022, but does not recall if hemorrhoids were mentioned in the report. The patient reports that the hemorrhoid issue seemed to become more persistent after the colonoscopy.    PERTINENT PMH / PSH: As  above  OBJECTIVE:  BP 132/64   Pulse 62   Temp 98 F (36.7 C)   Resp 18   Ht 5\' 7"  (1.702 m)   Wt 183 lb 4 oz (83.1 kg)   SpO2 99%   BMI 28.70 kg/m    Physical Exam Vitals reviewed. Exam conducted with a chaperone present.  Constitutional:      General: She is not in acute distress.    Appearance: Normal appearance. She is not ill-appearing, toxic-appearing or diaphoretic.  Eyes:     General:        Right eye: No discharge.        Left eye: No discharge.     Conjunctiva/sclera: Conjunctivae normal.  Cardiovascular:     Rate and Rhythm: Normal rate and regular rhythm.     Heart sounds: Normal heart sounds.  Pulmonary:     Effort: Pulmonary effort is normal.     Breath sounds: Normal breath sounds.  Abdominal:     General: Bowel sounds are normal.  Genitourinary:    General: Normal vulva.     Exam position: Lithotomy position.     Labia:        Right: No rash, tenderness or lesion.        Left: No rash, tenderness or lesion.      Vagina: No vaginal discharge, tenderness, bleeding, lesions or prolapsed vaginal walls.     Cervix: Normal.     Adnexa: Right adnexa normal and left adnexa normal.     Comments: Atrophic vagina, normal for age Musculoskeletal:        General: Normal range of motion.  Skin:  General: Skin is warm and dry.  Neurological:     General: No focal deficit present.     Mental Status: She is alert and oriented to person, place, and time. Mental status is at baseline.  Psychiatric:        Mood and Affect: Mood normal.        Behavior: Behavior normal.        Thought Content: Thought content normal.        Judgment: Judgment normal.        08/10/2023   11:55 AM 04/04/2023   10:20 AM 03/13/2023   12:24 PM 08/30/2022   10:45 AM 03/28/2022    1:38 PM  Depression screen PHQ 2/9  Decreased Interest 0 0 0 0 0  Down, Depressed, Hopeless 0 0 0 0 0  PHQ - 2 Score 0 0 0 0 0  Altered sleeping 0 0 0    Tired, decreased energy 1 0 0    Change in  appetite 0 0 0    Feeling bad or failure about yourself  0 0 0    Trouble concentrating 1 0 0    Moving slowly or fidgety/restless 0 0 0    Suicidal thoughts 0 0 0    PHQ-9 Score 2 0 0    Difficult doing work/chores Not difficult at all Not difficult at all Not difficult at all        08/10/2023   11:55 AM 04/04/2023   10:20 AM 08/30/2022   10:45 AM  GAD 7 : Generalized Anxiety Score  Nervous, Anxious, on Edge 1 0 0  Control/stop worrying 0 0 0  Worry too much - different things 1 0 0  Trouble relaxing 0 0 0  Restless 0 0 0  Easily annoyed or irritable 1 0 0  Afraid - awful might happen 0 0 0  Total GAD 7 Score 3 0 0  Anxiety Difficulty Not difficult at all Not difficult at all Not difficult at all    ASSESSMENT/PLAN:  Vaginal atrophy Assessment & Plan: Patient reports a sensation of irritation and pressure in the vaginal area, with a history of a single episode of bleeding from a small red spot on the side of the vaginal opening. No current bleeding. Recent history of antibiotic use. No current signs of infection or lesions on examination, but noted thinning of the vaginal mucosa likely due to decreased estrogen. -Apply lubricant to the vaginal opening for relief of dryness and irritation. -Avoid use of soaps and douches in the vaginal area. -Check urine for possible urinary tract infection. -Consider referral to urology or gynecology if symptoms persist or worsen.   Urine frequency Assessment & Plan: Patient reports increased urinary frequency and slight incontinence, particularly with coughing or sneezing. No current burning or pain with urination. -Check urine for possible urinary tract infection. -Consider referral to urology if symptoms persist or worsen.  Orders: -     POCT Urinalysis Dipstick (Automated)  Abnormal urinalysis -     Urinalysis, Routine w reflex microscopic  Hemorrhoids, unspecified hemorrhoid type Assessment & Plan: Patient reports persistent  hemorrhoids, with increased symptoms following a colonoscopy in September 2022. No current treatment plan in place. -Continue to follow up with GI if worsening.    PDMP reviewed  Return if symptoms worsen or fail to improve, for PCP.  Dana Allan, MD

## 2023-08-10 NOTE — Patient Instructions (Addendum)
It was a pleasure meeting you today. Thank you for allowing me to take part in your health care.  Our goals for today as we discussed include:  Vaginal exam normal for age Do not douche. Do not use scented: Sprays.  Use lubricant at night If no improvement notify MD      This is a list of the screening recommended for you and due dates:  Health Maintenance  Topic Date Due   Pneumonia Vaccine (1 of 2 - PCV) Never done   DTaP/Tdap/Td vaccine (1 - Tdap) Never done   Zoster (Shingles) Vaccine (1 of 2) Never done   COVID-19 Vaccine (5 - 2024-25 season) 04/16/2023   Medicare Annual Wellness Visit  03/12/2024   Colon Cancer Screening  04/30/2026   Flu Shot  Completed   DEXA scan (bone density measurement)  Completed   HPV Vaccine  Aged Out    Follow up as needed  If you have any questions or concerns, please do not hesitate to call the office at [phone].  I look forward to our next visit and until then take care and stay safe.  Regards,   Dana Allan, MD   [facility]

## 2023-08-13 ENCOUNTER — Encounter: Payer: Self-pay | Admitting: Family Medicine

## 2023-08-13 DIAGNOSIS — N952 Postmenopausal atrophic vaginitis: Secondary | ICD-10-CM | POA: Insufficient documentation

## 2023-08-13 DIAGNOSIS — R8290 Unspecified abnormal findings in urine: Secondary | ICD-10-CM | POA: Insufficient documentation

## 2023-08-13 DIAGNOSIS — R350 Frequency of micturition: Secondary | ICD-10-CM | POA: Insufficient documentation

## 2023-08-13 NOTE — Assessment & Plan Note (Signed)
Patient reports a sensation of irritation and pressure in the vaginal area, with a history of a single episode of bleeding from a small red spot on the side of the vaginal opening. No current bleeding. Recent history of antibiotic use. No current signs of infection or lesions on examination, but noted thinning of the vaginal mucosa likely due to decreased estrogen. -Apply lubricant to the vaginal opening for relief of dryness and irritation. -Avoid use of soaps and douches in the vaginal area. -Check urine for possible urinary tract infection. -Consider referral to urology or gynecology if symptoms persist or worsen.

## 2023-08-13 NOTE — Assessment & Plan Note (Addendum)
Patient reports persistent hemorrhoids, with increased symptoms following a colonoscopy in September 2022. No current treatment plan in place. -Continue to follow up with GI if worsening.

## 2023-08-13 NOTE — Assessment & Plan Note (Signed)
Patient reports increased urinary frequency and slight incontinence, particularly with coughing or sneezing. No current burning or pain with urination. -Check urine for possible urinary tract infection. -Consider referral to urology if symptoms persist or worsen.

## 2023-08-17 ENCOUNTER — Other Ambulatory Visit: Payer: Self-pay | Admitting: Family Medicine

## 2023-08-17 DIAGNOSIS — Z1231 Encounter for screening mammogram for malignant neoplasm of breast: Secondary | ICD-10-CM

## 2023-08-17 DIAGNOSIS — I10 Essential (primary) hypertension: Secondary | ICD-10-CM

## 2023-08-25 ENCOUNTER — Ambulatory Visit (INDEPENDENT_AMBULATORY_CARE_PROVIDER_SITE_OTHER): Payer: Medicare Other

## 2023-08-25 DIAGNOSIS — E538 Deficiency of other specified B group vitamins: Secondary | ICD-10-CM

## 2023-08-25 MED ORDER — CYANOCOBALAMIN 1000 MCG/ML IJ SOLN
1000.0000 ug | Freq: Once | INTRAMUSCULAR | Status: AC
  Administered 2023-08-25: 1000 ug via INTRAMUSCULAR

## 2023-08-25 NOTE — Progress Notes (Signed)
 Pt presented for their vitamin B12 injection. Pt was identified through two identifiers. Pt tolerated shot well in their left deltoid.

## 2023-08-28 ENCOUNTER — Ambulatory Visit: Payer: Medicare Other

## 2023-08-28 NOTE — Progress Notes (Signed)
 [name] [address] [place] [address] Phone: [phone] Subjective:   [name], am serving as a scribe for Dr. Arthea Claudene.  I'm seeing this patient by the request  of:  Maribeth Camellia MATSU, MD  CC: bilateral knee pain   YEP:Dlagzrupcz  06/27/2023 Patient is doing significantly better with the conservative therapy at this time. Did well with the injections. Continue to have her continue to work on core strengthening and continue to work on her weight but overall very happy with the result so far.   Update 08/29/2023 Lindsey Foster is a 78 y.o. female coming in with complaint of B knee pain, L>R. Patient states pain is worse this time around. Hip is where pain really is. Started water aerobics again after 6 or so weeks.      Past Medical History:  Diagnosis Date   Allergic rhinitis    Anxiety    panic attacks in hot rooms   Arthritis    Blood in stool    Breast cancer (HCC) 2006   radiation - Rt   Chickenpox    CKD (chronic kidney disease)    Colon polyps    Diverticulosis    GERD (gastroesophageal reflux disease)    Heart murmur    Hyperlipidemia    Hypertension    Hypothyroidism    Multiple thyroid  nodules    Osteopenia    Personal history of radiation therapy    Urinary incontinence    UTI (lower urinary tract infection)    Vitamin D  deficiency disease    Past Surgical History:  Procedure Laterality Date   BLADDER SURGERY     BREAST EXCISIONAL BIOPSY Right 2006   positive   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   CESAREAN SECTION  1985   COLONOSCOPY N/A 04/30/2021   Procedure: COLONOSCOPY;  Surgeon: Maryruth Ole DASEN, MD;  Location: [facility] ENDOSCOPY;  Service: Endoscopy;  Laterality: N/A;   COLONOSCOPY WITH PROPOFOL  N/A 09/28/2015   Procedure: COLONOSCOPY WITH PROPOFOL ;  Surgeon: Gladis RAYMOND Mariner, MD;  Location: [facility] ENDOSCOPY;  Service: Endoscopy;  Laterality: N/A;   DILATION AND CURETTAGE OF UTERUS  2012    ESOPHAGOGASTRODUODENOSCOPY (EGD) WITH PROPOFOL  N/A 08/23/2022   Procedure: ESOPHAGOGASTRODUODENOSCOPY (EGD) WITH PROPOFOL ;  Surgeon: Maryruth Ole DASEN, MD;  Location: [facility] ENDOSCOPY;  Service: Endoscopy;  Laterality: N/A;   Thyroid  nodule ablation  2003   Uterine polyp removal  2009    Social History   Socioeconomic History   Marital status: Widowed    Spouse name: Not on file   Number of children: Not on file   Years of education: Not on file   Highest education level: Not on file  Occupational History   Not on file  Tobacco Use   Smoking status: Former   Smokeless tobacco: Never  Vaping Use   Vaping status: Never Used  Substance and Sexual Activity   Alcohol use: No    Alcohol/week: 0.0 standard drinks of alcohol   Drug use: No   Sexual activity: Not on file  Other Topics Concern   Not on file  Social History Narrative   Not on file   Social Drivers of Health   Financial Resource Strain: Low Risk  (03/13/2023)   Overall Financial Resource Strain (CARDIA)    Difficulty of Paying Living Expenses: Not hard at all  Food Insecurity: No Food Insecurity (03/13/2023)   Hunger Vital Sign    Worried About Running Out of  Food in the Last Year: Never true    Ran Out of Food in the Last Year: Never true  Transportation Needs: No Transportation Needs (03/13/2023)   PRAPARE - Administrator, Civil Service (Medical): No    Lack of Transportation (Non-Medical): No  Physical Activity: Sufficiently Active (03/13/2023)   Exercise Vital Sign    Days of Exercise per Week: 2 days    Minutes of Exercise per Session: 90 min  Stress: No Stress Concern Present (03/13/2023)   [employer] of Occupational Health - Occupational Stress Questionnaire    Feeling of Stress : Not at all  Social Connections: Moderately Integrated (03/13/2023)   Social Connection and Isolation Panel [NHANES]    Frequency of Communication with Friends and Family: More than three times a week     Frequency of Social Gatherings with Friends and Family: More than three times a week    Attends Religious Services: More than 4 times per year    Active Member of [employer] or Organizations: Yes    Attends Banker Meetings: More than 4 times per year    Marital Status: Widowed   Allergies  Allergen Reactions   Latex Rash   Family History  Problem Relation Age of Onset   Angina Father    Heart attack Father    Multiple myeloma Brother    Uterine cancer Maternal Aunt    Heart disease Other        Parent   Breast cancer Neg Hx      Current Outpatient Medications (Cardiovascular):    amLODipine  (NORVASC ) 5 MG tablet, TAKE 1 TABLET(5 MG) BY MOUTH DAILY  Current Outpatient Medications (Respiratory):    fluticasone (FLONASE  SENSIMIST) 27.5 MCG/SPRAY nasal spray, Place 2 sprays into the nose daily.   loratadine  (CLARITIN ) 10 MG tablet, Take 1 tablet (10 mg total) by mouth daily.    Current Outpatient Medications (Other):    Calcium Citrate-Vitamin D3 1000-400 LIQD, Take 1 tablet by mouth daily. Reported on 02/04/2016   clobetasol  cream (TEMOVATE ) 0.05 %, APPLY TOPICALLY TO THE AFFECTED AREA(S) TWICE DAILY FOR 2 WEEKS, THEN JUST ON WEEK DAYS AS NEEDED   clonazePAM  (KLONOPIN ) 0.5 MG tablet, Take 0.5 tablets (0.25 mg total) by mouth once as needed for up to 1 dose for anxiety. Take prior to getting on the plane during travel   Crisaborole  (EUCRISA ) 2 % OINT, Apply to affected skin once a day   famotidine  (PEPCID ) 20 MG tablet, TAKE 1 TABLET(20 MG) BY MOUTH TWICE DAILY BEFORE A MEAL (Patient taking differently: TAKE 1 TABLET(20 MG) BY MOUTH TWICE DAILY BEFORE A MEAL as needed)   Garlic 100 MG TABS, Take 1 tablet by mouth daily.    ketotifen (ZADITOR) 0.025 % ophthalmic solution, As needed   mometasone (ELOCON) 0.1 % lotion, Apply topically daily.   Reviewed prior external information including notes and imaging from  primary care provider As well as notes that were available  from care everywhere and other healthcare systems.  Past medical history, social, surgical and family history all reviewed in electronic medical record.  No pertanent information unless stated regarding to the chief complaint.   Review of Systems:  No headache, visual changes, nausea, vomiting, diarrhea, constipation, dizziness, abdominal pain, skin rash, fevers, chills, night sweats, weight loss, swollen lymph nodes, body aches, joint swelling, chest pain, shortness of breath, mood changes. POSITIVE muscle aches  Objective  Blood pressure 126/74, pulse 71, height 5' 7 (1.702 m), weight 185 lb (  83.9 kg), SpO2 97%.   General: No apparent distress alert and oriented x3 mood and affect normal, dressed appropriately.  HEENT: Pupils equal, extraocular movements intact  Respiratory: Patient's speak in full sentences and does not appear short of breath  Cardiovascular: No lower extremity edema, non tender, no erythema  Instability noted of left knee. Inflamed loss of lordosis   After informed written and verbal consent, patient was seated on exam table. Left knee was prepped with alcohol swab and utilizing anterolateral approach, patient's left knee space was injected with 4:1  marcaine 0.5%: Kenalog  40mg /dL. Patient tolerated the procedure well without immediate complications.   Impression and Recommendations:    The above documentation has been reviewed and is accurate and complete Simona Rocque M Marenda Accardi, DO

## 2023-08-29 ENCOUNTER — Ambulatory Visit (INDEPENDENT_AMBULATORY_CARE_PROVIDER_SITE_OTHER): Payer: Medicare Other

## 2023-08-29 ENCOUNTER — Encounter: Payer: Self-pay | Admitting: Family Medicine

## 2023-08-29 ENCOUNTER — Ambulatory Visit (INDEPENDENT_AMBULATORY_CARE_PROVIDER_SITE_OTHER): Payer: Medicare Other | Admitting: Family Medicine

## 2023-08-29 VITALS — BP 126/74 | HR 71 | Ht 67.0 in | Wt 185.0 lb

## 2023-08-29 DIAGNOSIS — M1712 Unilateral primary osteoarthritis, left knee: Secondary | ICD-10-CM | POA: Diagnosis not present

## 2023-08-29 DIAGNOSIS — M76891 Other specified enthesopathies of right lower limb, excluding foot: Secondary | ICD-10-CM | POA: Diagnosis not present

## 2023-08-29 DIAGNOSIS — M25552 Pain in left hip: Secondary | ICD-10-CM

## 2023-08-29 DIAGNOSIS — I878 Other specified disorders of veins: Secondary | ICD-10-CM | POA: Diagnosis not present

## 2023-08-29 DIAGNOSIS — M1612 Unilateral primary osteoarthritis, left hip: Secondary | ICD-10-CM | POA: Diagnosis not present

## 2023-08-29 NOTE — Assessment & Plan Note (Signed)
 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  HEP  Hope the knee injection helps RTC- 2 months

## 2023-08-29 NOTE — Assessment & Plan Note (Signed)
 Today only left knee seem to be giving her severity of pain and some instability.  Concerned that this is contributed to some of the new hip pain that she is having.  Discussed icing regimen and home exercises.  Which activities to do things to avoid.  Follow-up again in 6 to 8 weeks

## 2023-08-29 NOTE — Patient Instructions (Addendum)
 Xrays today Injection in L knee See you again in 2 months

## 2023-09-04 ENCOUNTER — Telehealth: Payer: Self-pay | Admitting: Family Medicine

## 2023-09-04 NOTE — Telephone Encounter (Signed)
Patient called to follow up on her xray results. We are still waiting for them to be read but she said that the pain seems to be getting worse especially when putting pressure on it like walking or standing.

## 2023-09-05 ENCOUNTER — Encounter: Payer: Self-pay | Admitting: Family Medicine

## 2023-09-05 NOTE — Telephone Encounter (Signed)
Spoke to patient.  She said that she is going to hold off on MRI for now.

## 2023-09-07 ENCOUNTER — Inpatient Hospital Stay (HOSPITAL_BASED_OUTPATIENT_CLINIC_OR_DEPARTMENT_OTHER): Payer: Medicare Other | Admitting: Oncology

## 2023-09-07 ENCOUNTER — Other Ambulatory Visit: Payer: Self-pay

## 2023-09-07 ENCOUNTER — Encounter: Payer: Self-pay | Admitting: Oncology

## 2023-09-07 ENCOUNTER — Inpatient Hospital Stay: Payer: Medicare Other | Attending: Oncology

## 2023-09-07 VITALS — BP 152/76 | HR 61 | Temp 96.5°F | Resp 18 | Wt 182.6 lb

## 2023-09-07 DIAGNOSIS — M810 Age-related osteoporosis without current pathological fracture: Secondary | ICD-10-CM | POA: Diagnosis not present

## 2023-09-07 DIAGNOSIS — D0511 Intraductal carcinoma in situ of right breast: Secondary | ICD-10-CM

## 2023-09-07 DIAGNOSIS — Z86000 Personal history of in-situ neoplasm of breast: Secondary | ICD-10-CM | POA: Insufficient documentation

## 2023-09-07 DIAGNOSIS — N183 Chronic kidney disease, stage 3 unspecified: Secondary | ICD-10-CM

## 2023-09-07 LAB — CMP (CANCER CENTER ONLY)
ALT: 19 U/L (ref 0–44)
AST: 19 U/L (ref 15–41)
Albumin: 4.1 g/dL (ref 3.5–5.0)
Alkaline Phosphatase: 61 U/L (ref 38–126)
Anion gap: 7 (ref 5–15)
BUN: 21 mg/dL (ref 8–23)
CO2: 26 mmol/L (ref 22–32)
Calcium: 9.6 mg/dL (ref 8.9–10.3)
Chloride: 104 mmol/L (ref 98–111)
Creatinine: 1.11 mg/dL — ABNORMAL HIGH (ref 0.44–1.00)
GFR, Estimated: 51 mL/min — ABNORMAL LOW (ref >60)
Glucose, Bld: 103 mg/dL — ABNORMAL HIGH (ref 70–99)
Potassium: 4 mmol/L (ref 3.5–5.1)
Sodium: 137 mmol/L (ref 135–145)
Total Bilirubin: 0.5 mg/dL (ref 0.0–1.2)
Total Protein: 7.1 g/dL (ref 6.5–8.1)

## 2023-09-07 LAB — CBC WITH DIFFERENTIAL (CANCER CENTER ONLY)
Abs Immature Granulocytes: 0.01 10*3/uL (ref 0.00–0.07)
Basophils Absolute: 0 10*3/uL (ref 0.0–0.1)
Basophils Relative: 1 %
Eosinophils Absolute: 0.1 10*3/uL (ref 0.0–0.5)
Eosinophils Relative: 2 %
HCT: 37.9 % (ref 36.0–46.0)
Hemoglobin: 12 g/dL (ref 12.0–15.0)
Immature Granulocytes: 0 %
Lymphocytes Relative: 30 %
Lymphs Abs: 1.2 10*3/uL (ref 0.7–4.0)
MCH: 26.7 pg (ref 26.0–34.0)
MCHC: 31.7 g/dL (ref 30.0–36.0)
MCV: 84.2 fL (ref 80.0–100.0)
Monocytes Absolute: 0.5 10*3/uL (ref 0.1–1.0)
Monocytes Relative: 13 %
Neutro Abs: 2.2 10*3/uL (ref 1.7–7.7)
Neutrophils Relative %: 54 %
Platelet Count: 204 10*3/uL (ref 150–400)
RBC: 4.5 MIL/uL (ref 3.87–5.11)
RDW: 14.6 % (ref 11.5–15.5)
WBC Count: 4.1 10*3/uL (ref 4.0–10.5)
nRBC: 0 % (ref 0.0–0.2)

## 2023-09-07 NOTE — Assessment & Plan Note (Addendum)
History of right breast DCIS s/p lumpectomy in 2006, s/p adjuvant radiation, and finished 5 years of Tamoxifen.  Recommend patient to continue annual screening mammogram.- ordered by her primary care physician.  Physical examination is non remarkable, no mass palpated.  Patient has questions about tumor markers for surveillance.  I had lengthy discussion with patient about the limitation of tumor markers and also reviewed note from Dr. Jonny Ruiz who was her previous oncologist.  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 Recurrence risk of previous treated DCIS is very low. Mammogram annually helps to detect development of any new early breast cancer.  We discussed about the option of being discharged and let PCP take over mammogram surveillance. She may continue her follow up appointment annually with me if she prefers.  She is undecided and would like to further discuss with her PCP Dr. Clent Ridges. She will call our office to set up follow up appointment if she prefers.

## 2023-09-07 NOTE — Assessment & Plan Note (Addendum)
09/05/2019 DEXA  revealed osteoporosis with a T score of -2.9 at the lumbar spine.  09/27/2021 DEXA T score of -3.4 left femur, 10-year osteoporosis related fracture 20%. Continue calcium and vitamin D supplementation.  She is not able to tolerate Fosamax due to dental issue and declined Zometa and Prolia.  I recommend her to get DEXA every 2 years. She is undecided if she would like to ask primary care take over DEXA.

## 2023-09-07 NOTE — Assessment & Plan Note (Signed)
Encourage oral hydration and avoid nephrotoxins.   

## 2023-09-07 NOTE — Progress Notes (Signed)
Chief Complaint: Lindsey Foster is a 78 y.o. 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  Recommend patient to continue annual screening mammogram.- ordered by her primary care physician.  Physical examination is non remarkable, no mass palpated.  Patient has questions about tumor markers for surveillance.  I had lengthy discussion with patient about the limitation of tumor markers and also reviewed note from Dr. Jonny Ruiz who was her previous oncologist.  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 Recurrence risk of previous treated DCIS is very low. Mammogram annually helps to detect development of any new early breast cancer.  We discussed about the option of being discharged and let PCP take over mammogram surveillance. She may continue her follow up appointment annually with me if she prefers.  She is undecided and would like to further discuss with her PCP Dr. Clent Ridges. She will call our office to set up follow up appointment if she prefers.    Osteoporosis 09/05/2019 DEXA  revealed osteoporosis with a T score of -2.9 at the lumbar spine.  09/27/2021 DEXA T score of -3.4 left femur, 10-year osteoporosis related fracture 20%. Continue calcium and vitamin D supplementation.  She is not able to tolerate Fosamax due to dental issue and declined Zometa and Prolia.  I recommend her to get DEXA every 2 years. She is undecided if she would like to ask primary care take over DEXA.   Stage 3 chronic kidney disease (HCC) Encourage oral hydration and avoid nephrotoxins.    No orders of the defined types were placed in this encounter.  We spent sufficient time to discuss many aspect of care, questions were answered to patient's satisfaction. All questions were answered. The patient  knows to call the clinic with any problems, questions or concerns.  We spent sufficient time to discuss many aspect of care, questions were answered to patient's satisfaction. A total of 40 minutes was spent on this visit.  With 30 minutes spent reviewing image findings, history of DCIS and treatment course, limitation of tumor marker in surveillance, osteoporosis surveillance and treatment options.  Additional 10 minutes was spent on answering patient's questions.   Rickard Patience, MD, PhD [facility] Hematology Oncology 09/07/2023   Pertinent oncology history Lindsey Foster is a 78 y.o. female who has above history reviewed by me today presents for follow up visit for history of DCIS.  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  She received radiation to the right breast from 08/2005 - 09/2005 at the [facility] in [place].  She then received tamoxifen for 5 years (12/2005-11/2010).  INTERVAL HISTORY Lindsey Foster is a 78 y.o. female who has above history reviewed by me today presents for follow up visit for  History of DCIS, osteoporosis. Occasional she has itchiness of bilateral breasts-she describes once to twice per year.  Patient denies any other new breast concerns.   Past Medical History:  Diagnosis Date   Allergic rhinitis    Anxiety    panic attacks in hot rooms   Arthritis    Blood in stool    Breast cancer (HCC)  2006   radiation - Rt   Chickenpox    CKD (chronic kidney disease)    Colon polyps    Diverticulosis    GERD (gastroesophageal reflux disease)    Heart murmur    Hyperlipidemia    Hypertension    Hypothyroidism    Multiple thyroid nodules    Osteopenia    Personal history of radiation therapy    Urinary incontinence    UTI (lower urinary tract infection)    Vitamin D  deficiency disease     Past Surgical History:  Procedure Laterality Date   BLADDER SURGERY     BREAST EXCISIONAL BIOPSY Right 2006   positive   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   CESAREAN SECTION  1985   COLONOSCOPY N/A 04/30/2021   Procedure: COLONOSCOPY;  Surgeon: Regis Bill, MD;  Location: [facility] ENDOSCOPY;  Service: Endoscopy;  Laterality: N/A;   COLONOSCOPY WITH PROPOFOL N/A 09/28/2015   Procedure: COLONOSCOPY WITH PROPOFOL;  Surgeon: Christena Deem, MD;  Location: [facility] ENDOSCOPY;  Service: Endoscopy;  Laterality: N/A;   DILATION AND CURETTAGE OF UTERUS  2012   ESOPHAGOGASTRODUODENOSCOPY (EGD) WITH PROPOFOL N/A 08/23/2022   Procedure: ESOPHAGOGASTRODUODENOSCOPY (EGD) WITH PROPOFOL;  Surgeon: Regis Bill, MD;  Location: [facility] ENDOSCOPY;  Service: Endoscopy;  Laterality: N/A;   Thyroid nodule ablation  2003   Uterine polyp removal  2009     Family History  Problem Relation Age of Onset   Angina Father    Heart attack Father    Multiple myeloma Brother    Uterine cancer Maternal Aunt    Heart disease Other        Parent   Breast cancer Neg Hx     Social History:  reports that she has quit smoking. She has never used smokeless tobacco. She reports that she does not drink alcohol and does not use drugs.The patient is alone today.  Allergies:  Allergies  Allergen Reactions   Latex Rash    Current Medications: Current Outpatient Medications  Medication Sig Dispense Refill   amLODipine (NORVASC) 5 MG tablet TAKE 1 TABLET(5 MG) BY MOUTH DAILY 90 tablet 1   Calcium Citrate-Vitamin D3 1000-400 LIQD Take 1 tablet by mouth daily. Reported on 02/04/2016     clobetasol cream (TEMOVATE) 0.05 % APPLY TOPICALLY TO THE AFFECTED AREA(S) TWICE DAILY FOR 2 WEEKS, THEN JUST ON WEEK DAYS AS NEEDED 60 g 1   clonazePAM (KLONOPIN) 0.5 MG tablet Take 0.5 tablets (0.25 mg total) by mouth once as needed for up to 1 dose for anxiety. Take prior to getting on the plane during travel 5  tablet 0   Crisaborole (EUCRISA) 2 % OINT Apply to affected skin once a day 60 g 2   famotidine (PEPCID) 20 MG tablet TAKE 1 TABLET(20 MG) BY MOUTH TWICE DAILY BEFORE A MEAL (Patient taking differently: TAKE 1 TABLET(20 MG) BY MOUTH TWICE DAILY BEFORE A MEAL as needed) 180 tablet 1   fluticasone (FLONASE SENSIMIST) 27.5 MCG/SPRAY nasal spray Place 2 sprays into the nose daily. 10 g 2   Garlic 100 MG TABS Take 1 tablet by mouth daily.      ketotifen (ZADITOR) 0.025 % ophthalmic solution As needed     loratadine (CLARITIN) 10 MG tablet Take 1 tablet (10 mg total) by mouth daily. 30 tablet 2   mometasone (ELOCON) 0.1 % lotion Apply topically daily.     No current facility-administered medications for this visit.   Review of Systems  Constitutional:  Negative for appetite change, chills, fatigue and fever.  HENT:   Negative for hearing loss and voice change.   Eyes:  Negative for eye problems.  Respiratory:  Negative for chest tightness and cough.   Cardiovascular:  Negative for chest pain.  Gastrointestinal:  Negative for abdominal distention, abdominal pain and blood in stool.  Endocrine: Negative for hot flashes.  Genitourinary:  Negative for difficulty urinating and frequency.   Musculoskeletal:  Positive for arthralgias.  Skin:  Negative for itching and rash.  Neurological:  Negative for extremity weakness.  Hematological:  Negative for adenopathy.  Psychiatric/Behavioral:  Negative for confusion.      Performance status (ECOG): 1  Vitals Blood pressure (!) 152/76, pulse 61, temperature (!) 96.5 F (35.8 C), temperature source Tympanic, resp. rate 18, weight 182 lb 9.6 oz (82.8 kg), SpO2 99%.   Physical Exam Vitals and nursing note reviewed.  Constitutional:      General: She is not in acute distress.    Appearance: She is not diaphoretic.     Comments: She ambulates with a walker  HENT:     Head: Normocephalic and atraumatic.  Eyes:     General: No scleral  icterus. Cardiovascular:     Rate and Rhythm: Normal rate and regular rhythm.     Heart sounds: Normal heart sounds. No murmur heard. Pulmonary:     Effort: Pulmonary effort is normal. No respiratory distress.     Breath sounds: Normal breath sounds. No wheezing or rales.  Chest:  Breasts:    Right: Skin change (scarring around nipple from 3-6 o'clock) present. No swelling, bleeding, mass or tenderness.     Left: No swelling, bleeding, mass or tenderness.  Abdominal:     General: Bowel sounds are normal. There is no distension.     Palpations: Abdomen is soft. There is no hepatomegaly, splenomegaly or mass.     Tenderness: There is no abdominal tenderness.  Musculoskeletal:        General: No swelling or tenderness. Normal range of motion.     Cervical back: Normal range of motion and neck supple.  Lymphadenopathy:     Head:     Right side of head: No preauricular, posterior auricular or occipital adenopathy.     Left side of head: No preauricular, posterior auricular or occipital adenopathy.     Cervical: No cervical adenopathy.     Upper Body:     Right upper body: No supraclavicular or axillary adenopathy.     Left upper body: No supraclavicular or axillary adenopathy.     Lower Body: No right inguinal adenopathy. No left inguinal adenopathy.  Skin:    General: Skin is warm and dry.  Neurological:     Mental Status: She is alert and oriented to person, place, and time.  Psychiatric:        Mood and Affect: Mood normal.       Labs    Latest Ref Rng & Units 09/07/2023   10:35 AM 04/04/2023   10:43 AM 08/16/2022   10:08 AM  CBC  WBC 4.0 - 10.5 K/uL 4.1  3.5  3.7   Hemoglobin 12.0 - 15.0 g/dL 16.1  09.6  04.5   Hematocrit 36.0 - 46.0 % 37.9  40.4  37.9   Platelets 150 - 400 K/uL 204  216.0  208       Latest Ref Rng & Units 09/07/2023   10:35 AM 07/04/2023   11:00 AM 08/16/2022   10:08  AM  CMP  Glucose 70 - 99 mg/dL 474  91  99   BUN 8 - 23 mg/dL 21  18  19     Creatinine 0.44 - 1.00 mg/dL 2.59  5.63  8.75   Sodium 135 - 145 mmol/L 137  140  141   Potassium 3.5 - 5.1 mmol/L 4.0  4.3  4.2   Chloride 98 - 111 mmol/L 104  103  106   CO2 22 - 32 mmol/L 26  30  28    Calcium 8.9 - 10.3 mg/dL 9.6  64.3  9.4   Total Protein 6.5 - 8.1 g/dL 7.1  7.3  7.4   Total Bilirubin 0.0 - 1.2 mg/dL 0.5  0.4  0.4   Alkaline Phos 38 - 126 U/L 61  64  65   AST 15 - 41 U/L 19  16  20    ALT 0 - 44 U/L 19  14  19

## 2023-09-11 ENCOUNTER — Other Ambulatory Visit: Payer: Self-pay

## 2023-09-11 NOTE — Telephone Encounter (Signed)
Pt would

## 2023-09-11 NOTE — Telephone Encounter (Signed)
Pt would like to move forward with MRI. She has pain from L hip to L knee, so unsure if both can be done?  Spoke a bit about needing an open MRI, but may be OK with meds on that day.

## 2023-09-12 ENCOUNTER — Ambulatory Visit
Admission: RE | Admit: 2023-09-12 | Discharge: 2023-09-12 | Disposition: A | Discharge status: Home / Self Care | Payer: Medicare Other | Source: Ambulatory Visit | Attending: Family Medicine | Admitting: Family Medicine

## 2023-09-12 DIAGNOSIS — Z1231 Encounter for screening mammogram for malignant neoplasm of breast: Secondary | ICD-10-CM | POA: Insufficient documentation

## 2023-09-13 ENCOUNTER — Other Ambulatory Visit: Payer: Self-pay

## 2023-09-13 DIAGNOSIS — M25552 Pain in left hip: Secondary | ICD-10-CM

## 2023-09-19 ENCOUNTER — Telehealth: Payer: Self-pay

## 2023-09-19 NOTE — Telephone Encounter (Signed)
Does she have somebody that can drive her to and from the MRI?  To get something to help with anxiety while having the MRI she will have to have somebody take her to the MRI and drive her home.

## 2023-09-19 NOTE — Telephone Encounter (Signed)
 Copied from CRM [id number]. Topic: General - Other >> Sep 19, 2023 10:31 AM Thersia BROCKS wrote: Reason for CRM: Patient has appointment for MRI on Friday the 7th wanted a pill to be prescribed for her because she is claustrophobic , stated the doctor stated she will need to speak with her primary doctor to be prescribed that

## 2023-09-20 MED ORDER — ALPRAZOLAM 0.5 MG PO TABS: 0.5000 mg | ORAL_TABLET | Freq: Once | ORAL | 0 refills | Status: AC

## 2023-09-20 NOTE — Telephone Encounter (Signed)
 Pt.notified

## 2023-09-20 NOTE — Telephone Encounter (Signed)
 Noted.  I have sent in Xanax  for her to take 1 tablet 60 minutes prior to the MRI.

## 2023-09-20 NOTE — Addendum Note (Signed)
 Addended by: Kent Pear on: 09/20/2023 08:40 AM   Modules accepted: Orders

## 2023-09-22 ENCOUNTER — Ambulatory Visit
Admission: RE | Admit: 2023-09-22 | Discharge: 2023-09-22 | Disposition: A | Discharge status: Home / Self Care | Payer: Medicare Other | Source: Ambulatory Visit | Attending: Family Medicine | Admitting: Family Medicine

## 2023-09-22 DIAGNOSIS — M25552 Pain in left hip: Secondary | ICD-10-CM

## 2023-09-22 DIAGNOSIS — S72012A Unspecified intracapsular fracture of left femur, initial encounter for closed fracture: Secondary | ICD-10-CM | POA: Diagnosis not present

## 2023-09-26 ENCOUNTER — Ambulatory Visit: Payer: Medicare Other

## 2023-10-01 ENCOUNTER — Encounter: Payer: Self-pay | Admitting: Family Medicine

## 2023-10-02 ENCOUNTER — Other Ambulatory Visit: Payer: Self-pay

## 2023-10-02 DIAGNOSIS — S72002A Fracture of unspecified part of neck of left femur, initial encounter for closed fracture: Secondary | ICD-10-CM

## 2023-10-03 DIAGNOSIS — M25552 Pain in left hip: Secondary | ICD-10-CM | POA: Diagnosis not present

## 2023-10-10 ENCOUNTER — Encounter: Payer: Self-pay | Admitting: Family Medicine

## 2023-10-10 ENCOUNTER — Ambulatory Visit (INDEPENDENT_AMBULATORY_CARE_PROVIDER_SITE_OTHER): Payer: Medicare Other | Admitting: Family Medicine

## 2023-10-10 VITALS — BP 126/62 | HR 77 | Temp 98.3°F | Resp 18 | Ht 67.0 in | Wt 180.4 lb

## 2023-10-10 DIAGNOSIS — L309 Dermatitis, unspecified: Secondary | ICD-10-CM

## 2023-10-10 DIAGNOSIS — M810 Age-related osteoporosis without current pathological fracture: Secondary | ICD-10-CM | POA: Diagnosis not present

## 2023-10-10 DIAGNOSIS — I10 Essential (primary) hypertension: Secondary | ICD-10-CM | POA: Diagnosis not present

## 2023-10-10 DIAGNOSIS — E559 Vitamin D deficiency, unspecified: Secondary | ICD-10-CM | POA: Diagnosis not present

## 2023-10-10 DIAGNOSIS — Z780 Asymptomatic menopausal state: Secondary | ICD-10-CM

## 2023-10-10 DIAGNOSIS — N1831 Chronic kidney disease, stage 3a: Secondary | ICD-10-CM | POA: Diagnosis not present

## 2023-10-10 DIAGNOSIS — E538 Deficiency of other specified B group vitamins: Secondary | ICD-10-CM

## 2023-10-10 DIAGNOSIS — K5900 Constipation, unspecified: Secondary | ICD-10-CM | POA: Diagnosis not present

## 2023-10-10 DIAGNOSIS — K117 Disturbances of salivary secretion: Secondary | ICD-10-CM

## 2023-10-10 NOTE — Progress Notes (Signed)
 SUBJECTIVE:   Chief Complaint  Patient presents with   Establish Care    Transferring from Dr. Birdie Sons   HPI Presents to clinic to transfer care  Discussed the use of AI scribe software for clinical note transcription with the patient, who gave verbal consent to proceed.  History of Present Illness Lindsey Foster is a 78 year old female with hypertension and osteoporosis who presents for transferring care and medication review. She is transferring care from Dr. Birdie Sons.  She is currently taking Norvasc (amlodipine) 5 mg daily for hypertension, initially prescribed by her kidney specialist. She also takes calcium and vitamin D supplements, and uses Temovate (clobetasol) cream for eczema, prescribed by her dermatologist. She occasionally uses Eucrisa for a skin condition on her back but finds it no more effective than regular lotion. She uses Pepcid (famotidine) and Flonase as needed, and takes garlic daily. She has a history of using clonazepam for anxiety during flights and MRIs, but it is not a regular medication. She also uses Alecon solution for eczema-related nasal itching, prescribed by her ENT.  She has a history of osteoporosis and was previously on Fosamax for five years. Her last bone density test showed a T-score of -3.4. She reports losing teeth and experiencing dry mouth, which she attributes to past Fosamax use. She uses a dry mouth mouthwash and lozenges, drinks at least 64 ounces of water daily, and uses a humidifier at night. She is not currently on any osteoporosis medication due to dental concerns.  She reports a recent hip fracture, which was identified as a nondisplaced fracture on an MRI conducted on September 21, 2023, after experiencing pain following water aerobics. She has osteoarthritis in her knee and received an injection on August 29, 2023. She is managing the hip fracture conservatively and is scheduled to follow up with her orthopedist.  She  experiences constipation with stools described as 'little round pellets.' She manages this with increased fluids, fiber, and occasional stool softeners. She has a history of hemorrhoids, which sometimes bleed, and has previously seen a GI doctor.  No abdominal pain or blood in stool. Reports dry mouth and constipation. No regular use of Claritin.      PERTINENT PMH / PSH: As above  OBJECTIVE:  BP 126/62   Pulse 77   Temp 98.3 F (36.8 C) (Oral)   Resp 18   Ht 5\' 7"  (1.702 m)   Wt 180 lb 6 oz (81.8 kg)   SpO2 97%   BMI 28.25 kg/m    Physical Exam Vitals reviewed.  Constitutional:      General: She is not in acute distress.    Appearance: She is not ill-appearing.  HENT:     Head: Normocephalic.     Right Ear: Tympanic membrane, ear canal and external ear normal.     Left Ear: Tympanic membrane, ear canal and external ear normal.     Nose: Nose normal.     Mouth/Throat:     Mouth: Mucous membranes are moist.  Eyes:     Extraocular Movements: Extraocular movements intact.     Conjunctiva/sclera: Conjunctivae normal.     Pupils: Pupils are equal, round, and reactive to light.  Neck:     Thyroid: No thyromegaly or thyroid tenderness.     Vascular: No carotid bruit.  Cardiovascular:     Rate and Rhythm: Normal rate and regular rhythm.     Pulses: Normal pulses.     Heart sounds: Normal heart sounds.  Pulmonary:     Effort: Pulmonary effort is normal.     Breath sounds: Normal breath sounds.  Abdominal:     General: Bowel sounds are normal. There is no distension.     Palpations: Abdomen is soft.     Tenderness: There is no abdominal tenderness. There is no right CVA tenderness, left CVA tenderness, guarding or rebound.  Musculoskeletal:        General: Normal range of motion.     Cervical back: Normal range of motion.     Right lower leg: No edema.     Left lower leg: No edema.  Lymphadenopathy:     Cervical: No cervical adenopathy.  Skin:    Capillary Refill:  Capillary refill takes less than 2 seconds.  Neurological:     General: No focal deficit present.     Mental Status: She is alert and oriented to person, place, and time. Mental status is at baseline.     Motor: No weakness.  Psychiatric:        Mood and Affect: Mood normal.        Behavior: Behavior normal.        Thought Content: Thought content normal.        Judgment: Judgment normal.           10/10/2023   10:17 AM 08/10/2023   11:55 AM 04/04/2023   10:20 AM 03/13/2023   12:24 PM 08/30/2022   10:45 AM  Depression screen PHQ 2/9  Decreased Interest 0 0 0 0 0  Down, Depressed, Hopeless 0 0 0 0 0  PHQ - 2 Score 0 0 0 0 0  Altered sleeping 0 0 0 0   Tired, decreased energy 0 1 0 0   Change in appetite 0 0 0 0   Feeling bad or failure about yourself  0 0 0 0   Trouble concentrating 0 1 0 0   Moving slowly or fidgety/restless 0 0 0 0   Suicidal thoughts 0 0 0 0   PHQ-9 Score 0 2 0 0   Difficult doing work/chores Not difficult at all Not difficult at all Not difficult at all Not difficult at all       10/10/2023   10:17 AM 08/10/2023   11:55 AM 04/04/2023   10:20 AM 08/30/2022   10:45 AM  GAD 7 : Generalized Anxiety Score  Nervous, Anxious, on Edge 0 1 0 0  Control/stop worrying 0 0 0 0  Worry too much - different things 0 1 0 0  Trouble relaxing 0 0 0 0  Restless 0 0 0 0  Easily annoyed or irritable 0 1 0 0  Afraid - awful might happen 0 0 0 0  Total GAD 7 Score 0 3 0 0  Anxiety Difficulty Not difficult at all Not difficult at all Not difficult at all Not difficult at all    ASSESSMENT/PLAN:  Primary hypertension Assessment & Plan: Well-controlled on Amlodipine 5mg  daily. -Continue Amlodipine 5mg  daily.   Postmenopausal estrogen deficiency -     DG Bone Density; Future  Osteoporosis without current pathological fracture, unspecified osteoporosis type Assessment & Plan: History of Fosamax use, currently not on any medication. Last DEXA scan showed a T-score of  -2.7. Recent non-displaced hip fracture. Discussed potential use of Prolia, but deferred due to upcoming surgery and ongoing dental issues. -Obtain previous DEXA scans and assess need for further treatment after resolution of dental issues and hip fracture.   Stage 3a  chronic kidney disease [facility]) Assessment & Plan: Regular follow-up with nephrologist, Dr. Cherylann Ratel. Recent improvement in GFR to 51. -Continue current management under nephrologist.   Vitamin D deficiency Assessment & Plan: Check Vitamin D level at next visit    Vitamin B 12 deficiency Assessment & Plan: Previously on B12 injections, but did not notice improvement in energy levels. -Check current B12 levels. If normal, consider stopping injections and switching to oral supplementation.   Xerostomia Assessment & Plan: Chronic issue, possibly related to past Fosamax use. Currently using high fluoride toothpaste and dry mouth mouthwash. -Consider use of over-the-counter lozenges to maintain oral moisture. Follow up with dentist for further management.   Constipation, unspecified constipation type Assessment & Plan: Reports hard, pellet-like stools despite daily bowel movements. No abdominal pain or blood in stool. -Increase fluid and fiber intake. Consider daily use of stool softener or laxative such as Miralax or Metamucil.   Eczema, unspecified type Assessment & Plan: Using Temovate, Elocon and Eucrisa creams as needed.  -Continue current management. -Follows with Dermatology      PDMP reviewed  Return in about 6 months (around 04/08/2024) for PCP.  Dana Allan, MD

## 2023-10-10 NOTE — Patient Instructions (Addendum)
 It was a pleasure meeting you today. Thank you for allowing me to take part in your health care.  Our goals for today as we discussed include:  Referral sent for Dexa scan. Please call to schedule appointment. [facility] [address] [place], [place] [address] [phone]    Can continue Vitamin B 12 1000 mcg daily  Follow up with Orthopedics and Nephrology as scheduled  Increase water intake, at least 64 oz daily Increase fiber intake Can take Metamucil daily Can take prune juice daily  Follow up in 6 months.  Will get blood work at that time   This is a list of the screening recommended for you and due dates:  Health Maintenance  Topic Date Due   DTaP/Tdap/Td vaccine (1 - Tdap) Never done   Zoster (Shingles) Vaccine (1 of 2) Never done   COVID-19 Vaccine (5 - 2024-25 season) 04/16/2023   Pneumonia Vaccine (1 of 2 - PCV) 10/09/2024*   Medicare Annual Wellness Visit  03/12/2024   Colon Cancer Screening  04/30/2026   Flu Shot  Completed   DEXA scan (bone density measurement)  Completed   HPV Vaccine  Aged Out  *Topic was postponed. The date shown is not the original due date.      If you have any questions or concerns, please do not hesitate to call the office at [phone].  I look forward to our next visit and until then take care and stay safe.  Regards,   Dana Allan, MD   [facility]

## 2023-10-15 ENCOUNTER — Encounter: Payer: Self-pay | Admitting: Family Medicine

## 2023-10-15 ENCOUNTER — Observation Stay
Admit: 2023-10-15 | Discharge: 2023-10-15 | Disposition: A | Discharge status: Home / Self Care | Attending: Internal Medicine | Admitting: Internal Medicine

## 2023-10-15 ENCOUNTER — Other Ambulatory Visit: Payer: Self-pay

## 2023-10-15 ENCOUNTER — Emergency Department

## 2023-10-15 ENCOUNTER — Observation Stay
Admission: EM | Admit: 2023-10-15 | Discharge: 2023-10-15 | Disposition: A | Discharge status: Home / Self Care | Attending: Internal Medicine | Admitting: Internal Medicine

## 2023-10-15 DIAGNOSIS — Z87891 Personal history of nicotine dependence: Secondary | ICD-10-CM | POA: Insufficient documentation

## 2023-10-15 DIAGNOSIS — E039 Hypothyroidism, unspecified: Secondary | ICD-10-CM | POA: Insufficient documentation

## 2023-10-15 DIAGNOSIS — Z780 Asymptomatic menopausal state: Secondary | ICD-10-CM | POA: Insufficient documentation

## 2023-10-15 DIAGNOSIS — R0789 Other chest pain: Secondary | ICD-10-CM | POA: Diagnosis not present

## 2023-10-15 DIAGNOSIS — Z91040 Latex allergy status: Secondary | ICD-10-CM | POA: Diagnosis not present

## 2023-10-15 DIAGNOSIS — E785 Hyperlipidemia, unspecified: Secondary | ICD-10-CM | POA: Diagnosis not present

## 2023-10-15 DIAGNOSIS — I129 Hypertensive chronic kidney disease with stage 1 through stage 4 chronic kidney disease, or unspecified chronic kidney disease: Secondary | ICD-10-CM | POA: Diagnosis not present

## 2023-10-15 DIAGNOSIS — I214 Non-ST elevation (NSTEMI) myocardial infarction: Principal | ICD-10-CM | POA: Insufficient documentation

## 2023-10-15 DIAGNOSIS — Z79899 Other long term (current) drug therapy: Secondary | ICD-10-CM | POA: Insufficient documentation

## 2023-10-15 DIAGNOSIS — K117 Disturbances of salivary secretion: Secondary | ICD-10-CM | POA: Insufficient documentation

## 2023-10-15 DIAGNOSIS — N1832 Chronic kidney disease, stage 3b: Secondary | ICD-10-CM | POA: Diagnosis present

## 2023-10-15 DIAGNOSIS — I10 Essential (primary) hypertension: Secondary | ICD-10-CM | POA: Diagnosis present

## 2023-10-15 DIAGNOSIS — I169 Hypertensive crisis, unspecified: Secondary | ICD-10-CM | POA: Diagnosis not present

## 2023-10-15 DIAGNOSIS — N1831 Chronic kidney disease, stage 3a: Secondary | ICD-10-CM | POA: Diagnosis not present

## 2023-10-15 DIAGNOSIS — Z853 Personal history of malignant neoplasm of breast: Secondary | ICD-10-CM

## 2023-10-15 DIAGNOSIS — R079 Chest pain, unspecified: Secondary | ICD-10-CM | POA: Diagnosis not present

## 2023-10-15 LAB — ECHOCARDIOGRAM COMPLETE
AR max vel: 2.57 cm2
AV Peak grad: 13 mmHg
Ao pk vel: 1.8 m/s
Area-P 1/2: 2.48 cm2
Calc EF: 66.6 %
S' Lateral: 2.7 cm
Single Plane A2C EF: 68.1 %
Single Plane A4C EF: 63.7 %
Weight: 2880 [oz_av]

## 2023-10-15 LAB — CBC
HCT: 41.1 % (ref 36.0–46.0)
Hemoglobin: 13.1 g/dL (ref 12.0–15.0)
MCH: 27.4 pg (ref 26.0–34.0)
MCHC: 31.9 g/dL (ref 30.0–36.0)
MCV: 86 fL (ref 80.0–100.0)
Platelets: 217 10*3/uL (ref 150–400)
RBC: 4.78 MIL/uL (ref 3.87–5.11)
RDW: 14.6 % (ref 11.5–15.5)
WBC: 6.4 10*3/uL (ref 4.0–10.5)
nRBC: 0 % (ref 0.0–0.2)

## 2023-10-15 LAB — BASIC METABOLIC PANEL
Anion gap: 9 (ref 5–15)
BUN: 13 mg/dL (ref 8–23)
CO2: 26 mmol/L (ref 22–32)
Calcium: 10.2 mg/dL (ref 8.9–10.3)
Chloride: 105 mmol/L (ref 98–111)
Creatinine, Ser: 1.16 mg/dL — ABNORMAL HIGH (ref 0.44–1.00)
GFR, Estimated: 48 mL/min — ABNORMAL LOW (ref >60)
Glucose, Bld: 121 mg/dL — ABNORMAL HIGH (ref 70–99)
Potassium: 3.9 mmol/L (ref 3.5–5.1)
Sodium: 140 mmol/L (ref 135–145)

## 2023-10-15 LAB — HEPARIN LEVEL (UNFRACTIONATED): Heparin Unfractionated: 0.43 [IU]/mL (ref 0.30–0.70)

## 2023-10-15 LAB — TROPONIN I (HIGH SENSITIVITY)
Troponin I (High Sensitivity): 405 ng/L (ref <18)
Troponin I (High Sensitivity): 457 ng/L (ref <18)

## 2023-10-15 MED ORDER — ACETAMINOPHEN 325 MG PO TABS: 650.0000 mg | ORAL_TABLET | ORAL | Status: DC | PRN

## 2023-10-15 MED ORDER — PERFLUTREN LIPID MICROSPHERE
1.0000 mL | INTRAVENOUS | Status: AC | PRN
  Administered 2023-10-15: 3 mL via INTRAVENOUS

## 2023-10-15 MED ORDER — ACETAMINOPHEN 325 MG PO TABS
650.0000 mg | ORAL_TABLET | Freq: Four times a day (QID) | ORAL | Status: DC | PRN
  Administered 2023-10-15: 650 mg via ORAL
  Filled 2023-10-15: qty 2

## 2023-10-15 MED ORDER — METOPROLOL TARTRATE 25 MG PO TABS: 12.5000 mg | ORAL_TABLET | Freq: Two times a day (BID) | ORAL | 0 refills | Status: AC

## 2023-10-15 MED ORDER — ASPIRIN 81 MG PO TBEC: 81.0000 mg | DELAYED_RELEASE_TABLET | Freq: Every day | ORAL | Status: DC

## 2023-10-15 MED ORDER — NITROGLYCERIN 0.4 MG SL SUBL
0.4000 mg | SUBLINGUAL_TABLET | SUBLINGUAL | Status: AC
  Administered 2023-10-15: 0.4 mg via SUBLINGUAL
  Filled 2023-10-15: qty 1

## 2023-10-15 MED ORDER — ASPIRIN 81 MG PO CHEW
324.0000 mg | CHEWABLE_TABLET | Freq: Once | ORAL | Status: AC
  Administered 2023-10-15: 324 mg via ORAL
  Filled 2023-10-15: qty 4

## 2023-10-15 MED ORDER — METOPROLOL TARTRATE 25 MG PO TABS
12.5000 mg | ORAL_TABLET | Freq: Two times a day (BID) | ORAL | Status: DC
  Administered 2023-10-15 (×2): 12.5 mg via ORAL
  Filled 2023-10-15 (×2): qty 1

## 2023-10-15 MED ORDER — MORPHINE SULFATE (PF) 2 MG/ML IV SOLN: 2.0000 mg | INTRAVENOUS | Status: DC | PRN

## 2023-10-15 MED ORDER — ACETAMINOPHEN 325 MG RE SUPP: 650.0000 mg | Freq: Four times a day (QID) | RECTAL | Status: DC | PRN

## 2023-10-15 MED ORDER — NITROGLYCERIN 0.4 MG SL SUBL: 0.4000 mg | SUBLINGUAL_TABLET | SUBLINGUAL | Status: DC | PRN

## 2023-10-15 MED ORDER — HEPARIN (PORCINE) 25000 UT/250ML-% IV SOLN
1000.0000 [IU]/h | INTRAVENOUS | Status: DC
  Administered 2023-10-15: 1000 [IU]/h via INTRAVENOUS
  Filled 2023-10-15: qty 250

## 2023-10-15 MED ORDER — CLONAZEPAM 0.5 MG PO TABS: 0.5000 mg | ORAL_TABLET | Freq: Every day | ORAL | Status: DC | PRN

## 2023-10-15 MED ORDER — ASPIRIN 81 MG PO TBEC: 81.0000 mg | DELAYED_RELEASE_TABLET | Freq: Every day | ORAL | 0 refills | Status: AC

## 2023-10-15 MED ORDER — HEPARIN BOLUS VIA INFUSION
4000.0000 [IU] | Freq: Once | INTRAVENOUS | Status: AC
  Administered 2023-10-15: 4000 [IU] via INTRAVENOUS
  Filled 2023-10-15: qty 4000

## 2023-10-15 MED ORDER — ONDANSETRON HCL 4 MG/2ML IJ SOLN: 4.0000 mg | Freq: Four times a day (QID) | INTRAMUSCULAR | Status: DC | PRN

## 2023-10-15 MED ORDER — NITROGLYCERIN 0.4 MG SL SUBL: 0.4000 mg | SUBLINGUAL_TABLET | SUBLINGUAL | 0 refills | Status: AC | PRN

## 2023-10-15 NOTE — Progress Notes (Signed)
 PHARMACY - ANTICOAGULATION CONSULT NOTE  Pharmacy Consult for Heparin  Indication: chest pain/ACS  Allergies  Allergen Reactions   Latex Rash    Patient Measurements: Weight: 81.6 kg (180 lb) Heparin Dosing Weight: 81.6 kg   Vital Signs: Temp: 98.9 F (37.2 C) (03/02 1001) Temp Source: Oral (03/02 1001) BP: 136/84 (03/02 1001) Pulse Rate: 80 (03/02 1001)  Labs: Recent Labs    10/15/23 0016 10/15/23 0235 10/15/23 1020  HGB 13.1  --   --   HCT 41.1  --   --   PLT 217  --   --   HEPARINUNFRC  --   --  0.43  CREATININE 1.16*  --   --   TROPONINIHS 457* 405*  --    Estimated Creatinine Clearance: 43.2 mL/min (A) (by C-G formula based on SCr of 1.16 mg/dL (H)).  Medical History: Past Medical History:  Diagnosis Date   Allergic rhinitis    Anxiety    panic attacks in hot rooms   Arthritis    Blood in stool    Breast cancer (HCC) 2006   radiation - Rt   Chickenpox    CKD (chronic kidney disease)    Colon polyps    Diverticulosis    GERD (gastroesophageal reflux disease)    Heart murmur    Hyperlipidemia    Hypertension    Hypothyroidism    Multiple thyroid nodules    Osteopenia    Personal history of radiation therapy    Urinary incontinence    UTI (lower urinary tract infection)    Vitamin D deficiency disease     Assessment: Pharmacy consulted to dose heparin in this 78 year old female admitted with ACS/NSTEMI.  No prior anticoag noted. CrCl = 43.2 ml/min.  Goal of Therapy:  Heparin level 0.3-0.7 units/ml Monitor platelets by anticoagulation protocol: Yes    Date Time Results Comments 3/2 1020 HL=0.43 Therapeutic x 1 at 1000 units/hr  Plan:  Continue heparin infusion at 1000 units/hr. Check HL in 8 hours to confirm therapeutic rate and daily while on heparin. Continue to monitor H&H and platelets.  Dorothey Oetken Rodriguez-Guzman PharmD, BCPS 10/15/2023 11:14 AM

## 2023-10-15 NOTE — Assessment & Plan Note (Signed)
 Previously on B12 injections, but did not notice improvement in energy levels. -Check current B12 levels. If normal, consider stopping injections and switching to oral supplementation.

## 2023-10-15 NOTE — H&P (Addendum)
 History and Physical    Patient: Lindsey Foster WUJ:[id number] DOB: 10-28-1945 DOA: 10/15/2023 DOS: the patient was seen and examined on 10/15/2023 PCP: Dana Allan, MD  Patient coming from: Home  Chief Complaint:  Chief Complaint  Patient presents with   Chest Pain    HPI: Lindsey Foster is a 78 y.o. female with medical history significant for HTN, HLD CKD llla, DCIS s/p radiation and chemo, remote tobacco use being admitted with a suspected NSTEMI.  States that for the past 2 nights she had been feeling nonradiating retrosternal discomfort which she describes like a muscle spasm.  This morning on awaking the discomfort was worse and persisted throughout the day but was of low intensity.  She was able to attend to her flower beds for several hours and the pain persisted thus prompting the visit to the ED. The pain was relieved with nitroglycerin administered in the ER.  She had no associated shortness of breath, nausea, vomiting, palpitations or lightheadedness.  She did endorse a headache and some sneezing.  No cough or fever.   She denies  lower extremity pain or swelling. ED course and data review: BP 169/90 with otherwise normal vitals. Workup notable for first troponin of 457 BMP and CBC unremarkable, with creatinine at baseline at 1.16. EKG, personally viewed and interpreted showing NSR at 89 with RBBB Chest x-ray nonacute  Patient treated with 324mg  chewable aspirin and given sublingual nitroglycerin and started on a heparin infusion Hospitalist consulted for admission.   Review of Systems: As mentioned in the history of present illness. All other systems reviewed and are negative.  Past Medical History:  Diagnosis Date   Allergic rhinitis    Anxiety    panic attacks in hot rooms   Arthritis    Blood in stool    Breast cancer (HCC) 2006   radiation - Rt   Chickenpox    CKD (chronic kidney disease)    Colon polyps    Diverticulosis    GERD (gastroesophageal  reflux disease)    Heart murmur    Hyperlipidemia    Hypertension    Hypothyroidism    Multiple thyroid nodules    Osteopenia    Personal history of radiation therapy    Urinary incontinence    UTI (lower urinary tract infection)    Vitamin D deficiency disease    Past Surgical History:  Procedure Laterality Date   BLADDER SURGERY     BREAST EXCISIONAL BIOPSY Right 2006   positive   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   CESAREAN SECTION  1985   COLONOSCOPY N/A 04/30/2021   Procedure: COLONOSCOPY;  Surgeon: Regis Bill, MD;  Location: [facility] ENDOSCOPY;  Service: Endoscopy;  Laterality: N/A;   COLONOSCOPY WITH PROPOFOL N/A 09/28/2015   Procedure: COLONOSCOPY WITH PROPOFOL;  Surgeon: Christena Deem, MD;  Location: [facility] ENDOSCOPY;  Service: Endoscopy;  Laterality: N/A;   DILATION AND CURETTAGE OF UTERUS  2012   ESOPHAGOGASTRODUODENOSCOPY (EGD) WITH PROPOFOL N/A 08/23/2022   Procedure: ESOPHAGOGASTRODUODENOSCOPY (EGD) WITH PROPOFOL;  Surgeon: Regis Bill, MD;  Location: [facility] ENDOSCOPY;  Service: Endoscopy;  Laterality: N/A;   Thyroid nodule ablation  2003   Uterine polyp removal  2009    Social History:  reports that she has quit smoking. She has never used smokeless tobacco. She reports that she does not drink alcohol and does not use drugs.  Allergies  Allergen Reactions   Latex Rash    Family History  Problem Relation  Age of Onset   Angina Father    Heart attack Father    Multiple myeloma Brother    Uterine cancer Maternal Aunt    Heart disease Other        Parent   Breast cancer Neg Hx     Prior to Admission medications   Medication Sig Start Date End Date Taking? Authorizing Provider  amLODipine (NORVASC) 5 MG tablet TAKE 1 TABLET(5 MG) BY MOUTH DAILY 08/17/23   [name], MD  Calcium Citrate-Vitamin D3 1000-400 LIQD Take 1 tablet by mouth daily. Reported on 02/04/2016    [provider]  clobetasol cream (TEMOVATE) 0.05 % APPLY TOPICALLY TO  THE AFFECTED AREA(S) TWICE DAILY FOR 2 WEEKS, THEN JUST ON WEEK DAYS AS NEEDED 05/12/23   [name], MD  clonazePAM (KLONOPIN) 0.5 MG tablet Take 0.5 tablets (0.25 mg total) by mouth once as needed for up to 1 dose for anxiety. Take prior to getting on the plane during travel 02/08/22   [name], MD  [facility]) 2 % OINT Apply to affected skin once a day 05/04/22   Deirdre Evener, MD  famotidine (PEPCID) 20 MG tablet TAKE 1 TABLET(20 MG) BY MOUTH TWICE DAILY BEFORE A MEAL Patient taking differently: TAKE 1 TABLET(20 MG) BY MOUTH TWICE DAILY BEFORE A MEAL as needed 05/10/21   [name], MD  fluticasone (FLONASE SENSIMIST) 27.5 MCG/SPRAY nasal spray Place 2 sprays into the nose daily. 11/09/21   [name], MD  Garlic 100 MG TABS Take 1 tablet by mouth daily.     [provider]  ketotifen (ZADITOR) 0.025 % ophthalmic solution As needed    [provider]  loratadine (CLARITIN) 10 MG tablet Take 1 tablet (10 mg total) by mouth daily. 07/04/23   [name], MD  mometasone (ELOCON) 0.1 % lotion Apply topically daily.    [provider]    Physical Exam: Vitals:   10/15/23 0012 10/15/23 0115 10/15/23 0122 10/15/23 0145  BP: (!) 169/90 (!) 153/88  (!) 142/82  Pulse: 79 84  89  Resp: 15 18  (!) 25  Temp: 98.1 F (36.7 C)     TempSrc: Oral     SpO2: 98% 94% 95% 93%  Weight: 81.6 kg      Physical Exam Vitals and nursing note reviewed.  Constitutional:      General: She is not in acute distress. HENT:     Head: Normocephalic and atraumatic.  Cardiovascular:     Rate and Rhythm: Normal rate and regular rhythm.     Heart sounds: Normal heart sounds.  Pulmonary:     Effort: Pulmonary effort is normal.     Breath sounds: Normal breath sounds.  Abdominal:     Palpations: Abdomen is soft.     Tenderness: There is no abdominal tenderness.  Neurological:     Mental Status: Mental status is at baseline.     Labs  on Admission: I have personally reviewed following labs and imaging studies  CBC: Recent Labs  Lab 10/15/23 0016  WBC 6.4  HGB 13.1  HCT 41.1  MCV 86.0  PLT 217   Basic Metabolic Panel: Recent Labs  Lab 10/15/23 0016  NA 140  K 3.9  CL 105  CO2 26  GLUCOSE 121*  BUN 13  CREATININE 1.16*  CALCIUM 10.2   GFR: Estimated Creatinine Clearance: 43.2 mL/min (A) (by C-G formula based on SCr of 1.16 mg/dL (H)). Liver Function  Tests: No results for input(s): "AST", "ALT", "ALKPHOS", "BILITOT", "PROT", "ALBUMIN" in the last 168 hours. No results for input(s): "LIPASE", "AMYLASE" in the last 168 hours. No results for input(s): "AMMONIA" in the last 168 hours. Coagulation Profile: No results for input(s): "INR", "PROTIME" in the last 168 hours. Cardiac Enzymes: No results for input(s): "CKTOTAL", "CKMB", "CKMBINDEX", "TROPONINI" in the last 168 hours. BNP (last 3 results) No results for input(s): "PROBNP" in the last 8760 hours. HbA1C: No results for input(s): "HGBA1C" in the last 72 hours. CBG: No results for input(s): "GLUCAP" in the last 168 hours. Lipid Profile: No results for input(s): "CHOL", "HDL", "LDLCALC", "TRIG", "CHOLHDL", "LDLDIRECT" in the last 72 hours. Thyroid Function Tests: No results for input(s): "TSH", "T4TOTAL", "FREET4", "T3FREE", "THYROIDAB" in the last 72 hours. Anemia Panel: No results for input(s): "VITAMINB12", "FOLATE", "FERRITIN", "TIBC", "IRON", "RETICCTPCT" in the last 72 hours. Urine analysis:    Component Value Date/Time   COLORURINE YELLOW 08/10/2023 1333   APPEARANCEUR CLEAR 08/10/2023 1333   LABSPEC 1.020 08/10/2023 1333   PHURINE 7.0 08/10/2023 1333   GLUCOSEU NEGATIVE 08/10/2023 1333   HGBUR TRACE-INTACT (A) 08/10/2023 1333   BILIRUBINUR NEGATIVE 08/10/2023 1333   BILIRUBINUR Negative 08/10/2023 1232   KETONESUR NEGATIVE 08/10/2023 1333   PROTEINUR Negative 08/10/2023 1232   UROBILINOGEN 0.2 08/10/2023 1333   UROBILINOGEN 0.2  08/10/2023 1232   NITRITE NEGATIVE 08/10/2023 1333   NITRITE Negative 08/10/2023 1232   LEUKOCYTESUR NEGATIVE 08/10/2023 1333   LEUKOCYTESUR Negative 08/10/2023 1232    Radiological Exams on Admission: DG Chest 2 View Result Date: 10/15/2023 CLINICAL DATA:  CP EXAM: CHEST - 2 VIEW COMPARISON:  Chest x-ray 11//22/13 FINDINGS: The heart and mediastinal contours are unchanged. No focal consolidation. No pulmonary edema. No pleural effusion. No pneumothorax. No acute osseous abnormality. IMPRESSION: No active cardiopulmonary disease. Electronically Signed   By: Tish Frederickson M.D.   On: 10/15/2023 00:37     Data Reviewed: Relevant notes from primary care and specialist visits, past discharge summaries as available in EHR, including Care Everywhere. Prior diagnostic testing as pertinent to current admission diagnoses Updated medications and problem lists for reconciliation ED course, including vitals, labs, imaging, treatment and response to treatment Triage notes, nursing and pharmacy notes and ED provider's notes Notable results as noted in HPI   Assessment and Plan: * NSTEMI (non-ST elevated myocardial infarction) [facility]) Patient presents with typical chest pain, first troponin 457 Risk factors include HTN and HLD.  Father died of an MI but in his 5s.  Remote tobacco use over 20 years Continue to trend troponin Received full dose aspirin Continue heparin infusion Starting low-dose metoprolol Nitroglycerin sublingual as needed chest pain Daily aspirin, check lipids Cardiology consult for further risk stratification  HTN (hypertension) Will hold home amlodipine Will start metoprolol  History of breast cancer No acute issues suspected  Stage 3a chronic kidney disease (HCC) Renal function at baseline  HLD (hyperlipidemia) Not currently on a statin Follow-up lipid profile        DVT prophylaxis: Heparin infusion  Consults: [facility] cardiology  Advance Care Planning: full  code  Family Communication: Son and granddaughter at bedside  Disposition Plan: Back to previous home environment  Severity of Illness: The appropriate patient status for this patient is OBSERVATION. Observation status is judged to be reasonable and necessary in order to provide the required intensity of service to ensure the patient's safety. The patient's presenting symptoms, physical exam findings, and initial radiographic and laboratory data in the context  of their medical condition is felt to place them at decreased risk for further clinical deterioration. Furthermore, it is anticipated that the patient will be medically stable for discharge from the hospital within 2 midnights of admission.   Author: Andris Baumann, MD 10/15/2023 2:05 AM  For on call review [email or web address].

## 2023-10-15 NOTE — Assessment & Plan Note (Signed)
 History of Fosamax use, currently not on any medication. Last DEXA scan showed a T-score of -2.7. Recent non-displaced hip fracture. Discussed potential use of Prolia, but deferred due to upcoming surgery and ongoing dental issues. -Obtain previous DEXA scans and assess need for further treatment after resolution of dental issues and hip fracture.

## 2023-10-15 NOTE — ED Notes (Signed)
 Pt ambulated to in room toilet and back to bed 1 assist.  Tolerated well.

## 2023-10-15 NOTE — Assessment & Plan Note (Signed)
 Reports hard, pellet-like stools despite daily bowel movements. No abdominal pain or blood in stool. -Increase fluid and fiber intake. Consider daily use of stool softener or laxative such as Miralax or Metamucil.

## 2023-10-15 NOTE — Assessment & Plan Note (Signed)
 Using Temovate, Elocon and Eucrisa creams as needed.  -Continue current management. -Follows with Dermatology

## 2023-10-15 NOTE — Assessment & Plan Note (Signed)
Check Vitamin D level at next visit.

## 2023-10-15 NOTE — Assessment & Plan Note (Signed)
 -  Renal function  at baseline

## 2023-10-15 NOTE — Assessment & Plan Note (Signed)
 Well-controlled on Amlodipine 5mg  daily. -Continue Amlodipine 5mg  daily.

## 2023-10-15 NOTE — Assessment & Plan Note (Signed)
 Not currently on a statin Follow-up lipid profile

## 2023-10-15 NOTE — ED Provider Notes (Signed)
 [facility] Provider Note    Event Date/Time   First MD Initiated Contact with Patient 10/15/23 0110     (approximate)   History   Chest Pain   HPI Lindsey Foster is a 78 y.o. female who presents for evaluation of pain in the center of her chest that started about 2 days ago and was intermittent but got worse tonight.  She said that it bothered her a little bit over the last couple of days but then it would go away again.  However she thinks she overexerted herself this evening working in her garden and her chest pain was more noticeable tonight.  She denies having any shortness of breath or nausea/vomiting but was somewhat concerned about the chest pain.  She has no known history of coronary artery disease.  She said that her father had a heart attack and she personally has a history of hypertension and hyperlipidemia.  She has no history of blood clots in her legs or her lungs and has had no recent unilateral leg pain or swelling.     Physical Exam   Triage Vital Signs: ED Triage Vitals  Encounter Vitals Group     BP 10/15/23 0012 (!) 169/90     Systolic BP Percentile --      Diastolic BP Percentile --      Pulse Rate 10/15/23 0012 79     Resp 10/15/23 0012 15     Temp 10/15/23 0012 98.1 F (36.7 C)     Temp Source 10/15/23 0012 Oral     SpO2 10/15/23 0012 98 %     Weight 10/15/23 0012 81.6 kg (180 lb)     Height --      Head Circumference --      Peak Flow --      Pain Score 10/15/23 0011 4     Pain Loc --      Pain Education --      Exclude from Growth Chart --     Most recent vital signs: Vitals:   10/15/23 0700 10/15/23 0730  BP: 130/81 134/86  Pulse: 72 70  Resp: 19 18  Temp:    SpO2: 92% 95%    General: Awake, no obvious distress. CV:  Good peripheral perfusion.  Regular rate and rhythm. Resp:  Normal effort. Speaking easily and comfortably, no accessory muscle usage nor intercostal retractions.   Abd:  No distention.     ED Results / Procedures / Treatments   Labs (all labs ordered are listed, but only abnormal results are displayed) Labs Reviewed  BASIC METABOLIC PANEL - Abnormal; Notable for the following components:      Result Value   Glucose, Bld 121 (*)    Creatinine, Ser 1.16 (*)    GFR, Estimated 48 (*)    All other components within normal limits  TROPONIN I (HIGH SENSITIVITY) - Abnormal; Notable for the following components:   Troponin I (High Sensitivity) 457 (*)    All other components within normal limits  TROPONIN I (HIGH SENSITIVITY) - Abnormal; Notable for the following components:   Troponin I (High Sensitivity) 405 (*)    All other components within normal limits  CBC  LIPOPROTEIN A (LPA)  HEPARIN LEVEL (UNFRACTIONATED)     EKG  ED ECG REPORT I, Loleta Rose, the attending physician, personally viewed and interpreted this ECG.  Date: 10/15/2023 EKG Time: 00: 12 Rate: 97 Rhythm: normal sinus rhythm QRS Axis: normal Intervals: Right bundle  branch block ST/T Wave abnormalities: Non-specific ST segment / T-wave changes, but no clear evidence of acute ischemia. Narrative Interpretation: no definitive evidence of acute ischemia; does not meet STEMI criteria.    RADIOLOGY I viewed and interpreted the patient's two-view chest x-ray and I see no evidence of pneumonia nor pulmonary edema.  I also read the radiologist's report, which confirmed no acute findings.   PROCEDURES:  Critical Care performed: Yes, see critical care procedure note(s)  .1-3 Lead EKG Interpretation  Performed by: Loleta Rose, MD Authorized by: Loleta Rose, MD     Interpretation: normal     ECG rate:  95   ECG rate assessment: normal     Rhythm: sinus rhythm     Ectopy: none     Conduction: normal   .Critical Care  Performed by: Loleta Rose, MD Authorized by: Loleta Rose, MD   Critical care provider statement:    Critical care time (minutes):  30   Critical care time was  exclusive of:  Separately billable procedures and treating other patients   Critical care was necessary to treat or prevent imminent or life-threatening deterioration of the following conditions:  Cardiac failure   Critical care was time spent personally by me on the following activities:  Development of treatment plan with patient or surrogate, evaluation of patient's response to treatment, examination of patient, obtaining history from patient or surrogate, ordering and performing treatments and interventions, ordering and review of laboratory studies, ordering and review of radiographic studies, pulse oximetry, re-evaluation of patient's condition and review of old charts     IMPRESSION / MDM / ASSESSMENT AND PLAN / ED COURSE  I reviewed the triage vital signs and the nursing notes.                              Differential diagnosis includes, but is not limited to, ACS, PE, pneumonia, pneumothorax.  Patient's presentation is most consistent with acute presentation with potential threat to life or bodily function.  Labs/studies ordered: EKG, two-view chest x-ray, BMP, high-sensitivity troponin, CBC  Interventions/Medications given:  Medications  heparin ADULT infusion 100 units/mL (25000 units/248mL) (1,000 Units/hr Intravenous New Bag/Given 10/15/23 0219)  clonazePAM (KLONOPIN) tablet 0.5 mg (has no administration in time range)  aspirin EC tablet 81 mg (has no administration in time range)  nitroGLYCERIN (NITROSTAT) SL tablet 0.4 mg (has no administration in time range)  ondansetron (ZOFRAN) injection 4 mg (has no administration in time range)  metoprolol tartrate (LOPRESSOR) tablet 12.5 mg (12.5 mg Oral Given 10/15/23 0239)  acetaminophen (TYLENOL) tablet 650 mg (650 mg Oral Given 10/15/23 0238)    Or  acetaminophen (TYLENOL) suppository 650 mg ( Rectal See Alternative 10/15/23 0238)  morphine (PF) 2 MG/ML injection 2 mg (has no administration in time range)  nitroGLYCERIN (NITROSTAT) SL  tablet 0.4 mg (0.4 mg Sublingual Given 10/15/23 0134)  aspirin chewable tablet 324 mg (324 mg Oral Given 10/15/23 0132)  heparin bolus via infusion 4,000 Units (4,000 Units Intravenous Bolus from Bag 10/15/23 0219)    (Note:  hospital course my include additional interventions and/or labs/studies not listed above.)   Patient's vital signs are generally reassuring and she has only very mild symptoms at this time.  Lab work is generally normal except for an initial high-sensitivity troponin of nearly 500.  Unclear if this may indicate that she had an event 2 days ago when she was first symptomatic and is now downtrending  or if this is an ongoing process.  Regardless, I ordered 324 mg of aspirin p.o. and heparin bolus plus infusion.  I am ordering a single sublingual nitroglycerin to see if it resolves her pain.  Discussed with the patient and her family the need to stay in the hospital for additional evaluation and she understands and agrees.   The patient is on the cardiac monitor to evaluate for evidence of arrhythmia and/or significant heart rate changes.   Clinical Course as of 10/15/23 1308  Wynelle Link Oct 15, 2023  0155 Consulted with Dr. Para March with the hospitalist service who will admit the patient [CF]    Clinical Course User Index [CF] Loleta Rose, MD     FINAL CLINICAL IMPRESSION(S) / ED DIAGNOSES   Final diagnoses:  NSTEMI (non-ST elevated myocardial infarction) [facility])     Rx / DC Orders   ED Discharge Orders     None        Note:  This document was prepared using Dragon voice recognition software and may include unintentional dictation errors.   Loleta Rose, MD 10/15/23 [phone]

## 2023-10-15 NOTE — Assessment & Plan Note (Signed)
 Chronic issue, possibly related to past Fosamax use. Currently using high fluoride toothpaste and dry mouth mouthwash. -Consider use of over-the-counter lozenges to maintain oral moisture. Follow up with dentist for further management.

## 2023-10-15 NOTE — Assessment & Plan Note (Addendum)
 Patient presents with typical chest pain, first troponin 457 Risk factors include HTN and HLD.  Father died of an MI but in his 35s.  Remote tobacco use over 20 years Continue to trend troponin Received full dose aspirin Continue heparin infusion Starting low-dose metoprolol Nitroglycerin sublingual as needed chest pain Daily aspirin, check lipids Cardiology consult for further risk stratification

## 2023-10-15 NOTE — Discharge Summary (Addendum)
 Physician Discharge Summary   Patient: Lindsey Foster MRN: [id number] DOB: [birth year]  Admit date:     10/15/2023  Discharge date: 10/15/23  Discharge Physician: Jonah Blue   PCP: Dana Allan, MD   Recommendations at discharge:   Follow up with Dr. Cassie Freer within 1 week for further cardiology evaluation Start 81 mg aspirin daily Start metoprolol 12.5 mg twice daily Follow up with Dr. Clent Ridges in 1-2 weeks to follow up blood pressure, cholesterol  Discharge Diagnoses: Principal Problem:   Hypertensive crisis Active Problems:   HTN (hypertension)   HLD (hyperlipidemia)   Chest pain   Stage 3a chronic kidney disease (HCC)   History of breast cancer    Hospital Course: 77yo with h/o HTN, HLD, stage 3a CKD, and DCIS s/p chemo/rads who presented on 3/2 with chest pain, concern for NSTEMI. She was started on heparin infusion.  Flat troponins, heparin infusion stopped by cardiology.  Echocardiogram with short-term cardiology f/u for possible ischemic evaluation.  Assessment and Plan:  Hypertensive crisis Patient presented with atypical chest pain, troponin elevation to 457 in the setting of uncontrolled HTN Repeat troponin flat, NSTEMI ruled out Received full dose aspirin Started heparin infusion, stopped this AM since she is able to ambulate without recurrent symptoms Starting low-dose metoprolol Nitroglycerin sublingual as needed chest pain Continue daily aspirin Echo with preserved EF, no WMA, grade 1 diastolic dysfunction Cardiology consulted, will see in the office in 1 week to proceed with ischemia evaluation   HTN (hypertension) Resume home amlodipine Start low-dose metoprolol   History of breast cancer No acute issues suspected   Stage 3a chronic kidney disease  Renal function at baseline   HLD (hyperlipidemia) Not currently on a statin Follow-up lipid profile as an outpatient       Consultants: Cardiology   Procedures: Echocardiogram 3/2    Antibiotics: None  Pain control - [place] Controlled Substance Reporting System database was reviewed. and patient was instructed, not to drive, operate heavy machinery, perform activities at heights, swimming or participation in water activities or provide baby-sitting services while on Pain, Sleep and Anxiety Medications; until their outpatient Physician has advised to do so again. Also recommended to not to take more than prescribed Pain, Sleep and Anxiety Medications.   Disposition: Home Diet recommendation:  Cardiac diet DISCHARGE MEDICATION: Allergies as of 10/15/2023       Reactions   Latex Rash        Medication List     STOP taking these medications    clonazePAM 0.5 MG tablet Commonly known as: KLONOPIN       TAKE these medications    amLODipine 5 MG tablet Commonly known as: NORVASC TAKE 1 TABLET(5 MG) BY MOUTH DAILY   aspirin EC 81 MG tablet Take 1 tablet (81 mg total) by mouth daily. Swallow whole. Start taking on: October 16, 2023   Calcium Citrate-Vitamin D3 1000-400 Liqd Take 1 tablet by mouth daily. Reported on 02/04/2016   clobetasol cream 0.05 % Commonly known as: TEMOVATE APPLY TOPICALLY TO THE AFFECTED AREA(S) TWICE DAILY FOR 2 WEEKS, THEN JUST ON WEEK DAYS AS NEEDED   Eucrisa 2 % Oint Generic drug: Crisaborole Apply to affected skin once a day   famotidine 20 MG tablet Commonly known as: PEPCID TAKE 1 TABLET(20 MG) BY MOUTH TWICE DAILY BEFORE A MEAL   Flonase Sensimist 27.5 MCG/SPRAY nasal spray Generic drug: fluticasone Place 2 sprays into the nose daily.   Garlic 100 MG Tabs Take 1 tablet by  mouth daily.   ketotifen 0.025 % ophthalmic solution Commonly known as: ZADITOR As needed   loratadine 10 MG tablet Commonly known as: CLARITIN Take 1 tablet (10 mg total) by mouth daily.   metoprolol tartrate 25 MG tablet Commonly known as: LOPRESSOR Take 0.5 tablets (12.5 mg total) by mouth 2 (two) times daily.   mometasone 0.1 %  lotion Commonly known as: ELOCON Apply topically daily.   nitroGLYCERIN 0.4 MG SL tablet Commonly known as: NITROSTAT Place 1 tablet (0.4 mg total) under the tongue every 5 (five) minutes x 3 doses as needed for chest pain.        Discharge Exam:    Subjective: Chest discomfort resolved, BPs WNL, ambulating without symptoms, feels ready to go home.   Objective: Vitals:   10/15/23 1200 10/15/23 1223  BP: 127/78 127/78  Pulse: 66 66  Resp:  18  Temp: 99 F (37.2 C) 99 F (37.2 C)  SpO2: 93% 95%   No intake or output data in the 24 hours ending 10/15/23 1352 Filed Weights   10/15/23 0012  Weight: 81.6 kg    Exam:  General:  Appears calm and comfortable and is in NAD Eyes:  EOMI, normal lids, iris ENT:  grossly normal hearing, lips & tongue, mmm Neck:  no LAD, masses or thyromegaly Cardiovascular:  RRR, no m/r/g. No LE edema.  Respiratory:   CTA bilaterally with no wheezes/rales/rhonchi.  Normal respiratory effort. Abdomen:  soft, NT, ND Skin:  no rash or induration seen on limited exam Musculoskeletal:  grossly normal tone BUE/BLE, good ROM, no bony abnormality Psychiatric:  grossly normal mood and affect, speech fluent and appropriate, AOx3 Neurologic:  CN 2-12 grossly intact, moves all extremities in coordinated fashion  Data Reviewed: I have reviewed the patient's lab results since admission.  Pertinent labs for today include:   BUN 13/Creatinine 1.16/GFR 48, stable HS troponin 457, 405 Normal CBC    Condition at discharge: stable  The results of significant diagnostics from this hospitalization (including imaging, microbiology, ancillary and laboratory) are listed below for reference.   Imaging Studies: ECHOCARDIOGRAM COMPLETE Result Date: 10/15/2023    ECHOCARDIOGRAM REPORT   Patient Name:   Lindsey Foster Date of Exam: 10/15/2023 Medical Rec #:  [id number]             Height:       67.0 in Accession #:    [id number]            Weight:       180.0  lb Date of Birth:  15-Apr-1946              BSA:          1.934 m Patient Age:    77 years              BP:           136/84 mmHg Patient Gender: F                     HR:           70 bpm. Exam Location:  [facility] Procedure: 2D Echo, Cardiac Doppler, Color Doppler and Intracardiac            Opacification Agent (Both Spectral and Color Flow Doppler were            utilized during procedure). Indications:     Chest Pain R07.9  History:         Patient  has no prior history of Echocardiogram examinations.  Sonographer:     Overton Mam RDCS, FASE Referring Phys:  [id number] Willford Rabideau Diagnosing Phys: Marcina Millard MD  Sonographer Comments: Technically difficult study due to poor echo windows. Image acquisition challenging due to patient body habitus. IMPRESSIONS  1. Left ventricular ejection fraction, by estimation, is 65 to 70%. The left ventricle has normal function. The left ventricle has no regional wall motion abnormalities. There is mild left ventricular hypertrophy. Left ventricular diastolic parameters are consistent with Grade I diastolic dysfunction (impaired relaxation).  2. Right ventricular systolic function is normal. The right ventricular size is normal.  3. The mitral valve is normal in structure. Mild to moderate mitral valve regurgitation. No evidence of mitral stenosis.  4. Tricuspid valve regurgitation is mild to moderate.  5. The aortic valve is normal in structure. Aortic valve regurgitation is not visualized. Aortic valve sclerosis/calcification is present, without any evidence of aortic stenosis.  6. The inferior vena cava is normal in size with greater than 50% respiratory variability, suggesting right atrial pressure of 3 mmHg. FINDINGS  Left Ventricle: Left ventricular ejection fraction, by estimation, is 65 to 70%. The left ventricle has normal function. The left ventricle has no regional wall motion abnormalities. Definity contrast agent was given IV to delineate the left ventricular   endocardial borders. Strain imaging was not performed. The left ventricular internal cavity size was normal in size. There is mild left ventricular hypertrophy. Left ventricular diastolic parameters are consistent with Grade I diastolic dysfunction (impaired relaxation). Right Ventricle: The right ventricular size is normal. No increase in right ventricular wall thickness. Right ventricular systolic function is normal. Left Atrium: Left atrial size was normal in size. Right Atrium: Right atrial size was normal in size. Pericardium: There is no evidence of pericardial effusion. Mitral Valve: The mitral valve is normal in structure. Mild to moderate mitral valve regurgitation. No evidence of mitral valve stenosis. Tricuspid Valve: The tricuspid valve is normal in structure. Tricuspid valve regurgitation is mild to moderate. No evidence of tricuspid stenosis. Aortic Valve: The aortic valve is normal in structure. Aortic valve regurgitation is not visualized. Aortic valve sclerosis/calcification is present, without any evidence of aortic stenosis. Aortic valve peak gradient measures 13.0 mmHg. Pulmonic Valve: The pulmonic valve was normal in structure. Pulmonic valve regurgitation is not visualized. No evidence of pulmonic stenosis. Aorta: The aortic root is normal in size and structure. Venous: The inferior vena cava is normal in size with greater than 50% respiratory variability, suggesting right atrial pressure of 3 mmHg. IAS/Shunts: No atrial level shunt detected by color flow Doppler. Additional Comments: 3D imaging was not performed.  LEFT VENTRICLE PLAX 2D LVIDd:         4.00 cm     Diastology LVIDs:         2.70 cm     LV e' medial:    6.53 cm/s LV PW:         1.30 cm     LV E/e' medial:  7.0 LV IVS:        1.40 cm     LV e' lateral:   9.14 cm/s LVOT diam:     2.00 cm     LV E/e' lateral: 5.0 LV SV:         97 LV SV Index:   50 LVOT Area:     3.14 cm  LV Volumes (MOD) LV vol d, MOD A2C: 52.9 ml LV vol d, MOD  A4C:  68.1 ml LV vol s, MOD A2C: 16.9 ml LV vol s, MOD A4C: 24.7 ml LV SV MOD A2C:     36.0 ml LV SV MOD A4C:     68.1 ml LV SV MOD BP:      42.7 ml RIGHT VENTRICLE RV Basal diam:  3.50 cm RV S prime:     12.20 cm/s LEFT ATRIUM             Index        RIGHT ATRIUM           Index LA diam:        3.00 cm 1.55 cm/m   RA Area:     14.80 cm LA Vol (A2C):   33.7 ml 17.43 ml/m  RA Volume:   43.80 ml  22.65 ml/m LA Vol (A4C):   36.8 ml 19.03 ml/m LA Biplane Vol: 37.7 ml 19.50 ml/m  AORTIC VALVE                  PULMONIC VALVE AV Area (Vmax): 2.57 cm      PV Vmax:       0.98 m/s AV Vmax:        180.00 cm/s   PV Peak grad:  3.8 mmHg AV Peak Grad:   13.0 mmHg LVOT Vmax:      147.00 cm/s LVOT Vmean:     107.000 cm/s LVOT VTI:       0.309 m  AORTA Ao Root diam: 3.00 cm Ao Asc diam:  2.90 cm MITRAL VALVE               TRICUSPID VALVE MV Area (PHT): 2.48 cm    TR Peak grad:   36.0 mmHg MV Decel Time: 306 msec    TR Vmax:        300.00 cm/s MV E velocity: 45.60 cm/s MV A velocity: 76.00 cm/s  SHUNTS MV E/A ratio:  0.60        Systemic VTI:  0.31 m                            Systemic Diam: 2.00 cm Marcina Millard MD Electronically signed by Marcina Millard MD Signature Date/Time: 10/15/2023/1:34:22 PM    Final    DG Chest 2 View Result Date: 10/15/2023 CLINICAL DATA:  CP EXAM: CHEST - 2 VIEW COMPARISON:  Chest x-ray 11//22/13 FINDINGS: The heart and mediastinal contours are unchanged. No focal consolidation. No pulmonary edema. No pleural effusion. No pneumothorax. No acute osseous abnormality. IMPRESSION: No active cardiopulmonary disease. Electronically Signed   By: Tish Frederickson M.D.   On: 10/15/2023 00:37   MR HIP LEFT WO CONTRAST Result Date: 09/30/2023 CLINICAL DATA:  Left hip pain for 1 month EXAM: MR OF THE LEFT HIP WITHOUT CONTRAST TECHNIQUE: Multiplanar, multisequence MR imaging was performed. No intravenous contrast was administered. COMPARISON:  Hip x-ray 08/29/2023 FINDINGS: Bones: Bones:  Nondisplaced subcapital left femoral neck fracture with severe surrounding bone marrow edema. No acute fracture or dislocation.  No osteonecrosis. No periosteal reaction or bone destruction. No aggressive osseous lesion. Normal sacrum and sacroiliac joints. No SI joint widening or erosive changes. Partially visualized lower lumbar spine facet arthropathy. Articular cartilage and labrum Articular cartilage: Moderate partial-thickness cartilage loss of the femoral head and acetabulum bilaterally. Labrum: Degenerative tearing of the left anterosuperior labrum and severe degeneration of the superior labrum. Joint or bursal effusion Joint effusion:  No hip joint effusion.  No SI joint effusion. Bursae:  No bursal fluid. Muscles and tendons Flexors: Normal. Extensors: Normal. Abductors: Normal. Adductors: Normal. Gluteals: Normal. Hamstrings: Normal. Other findings No pelvic free fluid. No fluid collection or hematoma. No inguinal lymphadenopathy. No inguinal hernia. Diverticulosis without evidence of diverticulitis. Multiple uterine fibroids with the largest measuring 2.2 cm. IMPRESSION: 1. Acute nondisplaced subcapital left femoral neck fracture with severe surrounding bone marrow edema. 2. Moderate partial-thickness cartilage loss of the femoral head and acetabulum bilaterally. 3. Degenerative tearing of the left anterosuperior labrum and severe degeneration of the superior labrum. 4. Multiple uterine fibroids with the largest measuring 2.2 cm. If there is further clinical concern, recommend a pelvic ultrasound. Electronically Signed   By: Elige Ko M.D.   On: 09/30/2023 09:03    Microbiology: Results for orders placed or performed during the hospital encounter of 08/08/23  Wet prep, genital     Status: Abnormal   Collection Time: 08/08/23  2:27 PM  Result Value Ref Range Status   Yeast Wet Prep HPF POC NONE SEEN NONE SEEN Final   Trich, Wet Prep NONE SEEN NONE SEEN Final   Clue Cells Wet Prep HPF POC NONE  SEEN NONE SEEN Final   WBC, Wet Prep HPF POC <10 (A) <10 Final   Sperm NONE SEEN  Final    Comment: Performed at [facility], [address], [place], [place] [address]    Labs: CBC: Recent Labs  Lab 10/15/23 0016  WBC 6.4  HGB 13.1  HCT 41.1  MCV 86.0  PLT 217   Basic Metabolic Panel: Recent Labs  Lab 10/15/23 0016  NA 140  K 3.9  CL 105  CO2 26  GLUCOSE 121*  BUN 13  CREATININE 1.16*  CALCIUM 10.2   Liver Function Tests: No results for input(s): "AST", "ALT", "ALKPHOS", "BILITOT", "PROT", "ALBUMIN" in the last 168 hours. CBG: No results for input(s): "GLUCAP" in the last 168 hours.  Discharge time spent: greater than 30 minutes.  Signed: Jonah Blue, MD [facility] 10/15/2023

## 2023-10-15 NOTE — ED Triage Notes (Signed)
 Pt arrives with c/o CP that started about 2 days ago, but has worsen throughout today. Pt denies n/v or SOB. Pt has recent hip fx.

## 2023-10-15 NOTE — Consult Note (Signed)
 [facility] Cardiology  CARDIOLOGY CONSULT NOTE  Patient ID: Lorri Fukuhara MRN: [id number] DOB/AGE: 80-10-1945 80 y.o.  Admit date: 10/15/2023 Referring Physician Ophelia Charter Primary Physician [facility] Primary Cardiologist Daekwon Beswick Reason for Consultation chest pain  HPI: 78 year old female referred for evaluation of chest pain.  Patient reports recent elevation in blood pressure.  This is associated with chest discomfort, which was positional, felt to be more musculoskeletal.  He presented to [facility] ED where pressure was elevated 169/90.  ECG reveals normal sinus rhythm with chronic right bundle branch block.  Admission labs notable for mildly elevated troponin 457, 405.  Patient reports chest pain has resolved.  Blood pressure is in normal range.  Review of systems complete and found to be negative unless listed above     Past Medical History:  Diagnosis Date   Allergic rhinitis    Anxiety    panic attacks in hot rooms   Arthritis    Blood in stool    Breast cancer (HCC) 2006   radiation - Rt   Chickenpox    CKD (chronic kidney disease)    Colon polyps    Diverticulosis    GERD (gastroesophageal reflux disease)    Heart murmur    Hyperlipidemia    Hypertension    Hypothyroidism    Multiple thyroid nodules    Osteopenia    Personal history of radiation therapy    Urinary incontinence    UTI (lower urinary tract infection)    Vitamin D deficiency disease     Past Surgical History:  Procedure Laterality Date   BLADDER SURGERY     BREAST EXCISIONAL BIOPSY Right 2006   positive   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   CESAREAN SECTION  1985   COLONOSCOPY N/A 04/30/2021   Procedure: COLONOSCOPY;  Surgeon: Regis Bill, MD;  Location: [facility] ENDOSCOPY;  Service: Endoscopy;  Laterality: N/A;   COLONOSCOPY WITH PROPOFOL N/A 09/28/2015   Procedure: COLONOSCOPY WITH PROPOFOL;  Surgeon: Christena Deem, MD;  Location: [facility] ENDOSCOPY;  Service: Endoscopy;  Laterality: N/A;   DILATION AND  CURETTAGE OF UTERUS  2012   ESOPHAGOGASTRODUODENOSCOPY (EGD) WITH PROPOFOL N/A 08/23/2022   Procedure: ESOPHAGOGASTRODUODENOSCOPY (EGD) WITH PROPOFOL;  Surgeon: Regis Bill, MD;  Location: [facility] ENDOSCOPY;  Service: Endoscopy;  Laterality: N/A;   Thyroid nodule ablation  2003   Uterine polyp removal  2009     (Not in a hospital admission)  Social History   Socioeconomic History   Marital status: Widowed    Spouse name: Not on file   Number of children: Not on file   Years of education: Not on file   Highest education level: Not on file  Occupational History   Not on file  Tobacco Use   Smoking status: Former   Smokeless tobacco: Never  Vaping Use   Vaping status: Never Used  Substance and Sexual Activity   Alcohol use: No    Alcohol/week: 0.0 standard drinks of alcohol   Drug use: No   Sexual activity: Not on file  Other Topics Concern   Not on file  Social History Narrative   Not on file   Social Drivers of Health   Financial Resource Strain: Low Risk  (03/13/2023)   Overall Financial Resource Strain (CARDIA)    Difficulty of Paying Living Expenses: Not hard at all  Food Insecurity: No Food Insecurity (03/13/2023)   Hunger Vital Sign    Worried About Running Out of Food in the Last Year: Never  true    Ran Out of Food in the Last Year: Never true  Transportation Needs: No Transportation Needs (03/13/2023)   PRAPARE - Administrator, Civil Service (Medical): No    Lack of Transportation (Non-Medical): No  Physical Activity: Sufficiently Active (03/13/2023)   Exercise Vital Sign    Days of Exercise per Week: 2 days    Minutes of Exercise per Session: 90 min  Stress: No Stress Concern Present (03/13/2023)   [employer] of Occupational Health - Occupational Stress Questionnaire    Feeling of Stress : Not at all  Social Connections: Moderately Integrated (03/13/2023)   Social Connection and Isolation Panel [NHANES]    Frequency of Communication with  Friends and Family: More than three times a week    Frequency of Social Gatherings with Friends and Family: More than three times a week    Attends Religious Services: More than 4 times per year    Active Member of [employer] or Organizations: Yes    Attends Banker Meetings: More than 4 times per year    Marital Status: Widowed  Intimate Partner Violence: Not At Risk (03/13/2023)   Humiliation, Afraid, Rape, and Kick questionnaire    Fear of Current or Ex-Partner: No    Emotionally Abused: No    Physically Abused: No    Sexually Abused: No    Family History  Problem Relation Age of Onset   Angina Father    Heart attack Father    Multiple myeloma Brother    Uterine cancer Maternal Aunt    Heart disease Other        Parent   Breast cancer Neg Hx       Review of systems complete and found to be negative unless listed above      PHYSICAL EXAM  General: Well developed, well nourished, in no acute distress HEENT:  Normocephalic and atramatic Neck:  No JVD.  Lungs: Clear bilaterally to auscultation and percussion. Heart: HRRR . Normal S1 and S2 without gallops or murmurs.  Abdomen: Bowel sounds are positive, abdomen soft and non-tender  Msk:  Back normal, normal gait. Normal strength and tone for age. Extremities: No clubbing, cyanosis or edema.   Neuro: Alert and oriented X 3. Psych:  Good affect, responds appropriately  Labs:   Lab Results  Component Value Date   WBC 6.4 10/15/2023   HGB 13.1 10/15/2023   HCT 41.1 10/15/2023   MCV 86.0 10/15/2023   PLT 217 10/15/2023    Recent Labs  Lab 10/15/23 0016  NA 140  K 3.9  CL 105  CO2 26  BUN 13  CREATININE 1.16*  CALCIUM 10.2  GLUCOSE 121*   Lab Results  Component Value Date   CKTOTAL 125 05/17/2022    Lab Results  Component Value Date   CHOL 234 (H) 04/04/2023   CHOL 217 (H) 08/30/2022   CHOL 205 (H) 02/08/2022   Lab Results  Component Value Date   HDL 71.30 04/04/2023   HDL 77.20 08/30/2022    HDL 74.20 02/08/2022   Lab Results  Component Value Date   LDLCALC 143 (H) 04/04/2023   LDLCALC 127 (H) 08/30/2022   LDLCALC 118 (H) 02/08/2022   Lab Results  Component Value Date   TRIG 99.0 04/04/2023   TRIG 64.0 08/30/2022   TRIG 67.0 02/08/2022   Lab Results  Component Value Date   CHOLHDL 3 04/04/2023   CHOLHDL 3 08/30/2022   CHOLHDL 3 02/08/2022  Lab Results  Component Value Date   LDLDIRECT 128.0 05/08/2020   LDLDIRECT 123.0 03/27/2019   LDLDIRECT 127.0 07/24/2018      Radiology: DG Chest 2 View Result Date: 10/15/2023 CLINICAL DATA:  CP EXAM: CHEST - 2 VIEW COMPARISON:  Chest x-ray 11//22/13 FINDINGS: The heart and mediastinal contours are unchanged. No focal consolidation. No pulmonary edema. No pleural effusion. No pneumothorax. No acute osseous abnormality. IMPRESSION: No active cardiopulmonary disease. Electronically Signed   By: Tish Frederickson M.D.   On: 10/15/2023 00:37   MR HIP LEFT WO CONTRAST Result Date: 09/30/2023 CLINICAL DATA:  Left hip pain for 1 month EXAM: MR OF THE LEFT HIP WITHOUT CONTRAST TECHNIQUE: Multiplanar, multisequence MR imaging was performed. No intravenous contrast was administered. COMPARISON:  Hip x-ray 08/29/2023 FINDINGS: Bones: Bones: Nondisplaced subcapital left femoral neck fracture with severe surrounding bone marrow edema. No acute fracture or dislocation.  No osteonecrosis. No periosteal reaction or bone destruction. No aggressive osseous lesion. Normal sacrum and sacroiliac joints. No SI joint widening or erosive changes. Partially visualized lower lumbar spine facet arthropathy. Articular cartilage and labrum Articular cartilage: Moderate partial-thickness cartilage loss of the femoral head and acetabulum bilaterally. Labrum: Degenerative tearing of the left anterosuperior labrum and severe degeneration of the superior labrum. Joint or bursal effusion Joint effusion:  No hip joint effusion.  No SI joint effusion. Bursae:  No bursal  fluid. Muscles and tendons Flexors: Normal. Extensors: Normal. Abductors: Normal. Adductors: Normal. Gluteals: Normal. Hamstrings: Normal. Other findings No pelvic free fluid. No fluid collection or hematoma. No inguinal lymphadenopathy. No inguinal hernia. Diverticulosis without evidence of diverticulitis. Multiple uterine fibroids with the largest measuring 2.2 cm. IMPRESSION: 1. Acute nondisplaced subcapital left femoral neck fracture with severe surrounding bone marrow edema. 2. Moderate partial-thickness cartilage loss of the femoral head and acetabulum bilaterally. 3. Degenerative tearing of the left anterosuperior labrum and severe degeneration of the superior labrum. 4. Multiple uterine fibroids with the largest measuring 2.2 cm. If there is further clinical concern, recommend a pelvic ultrasound. Electronically Signed   By: Elige Ko M.D.   On: 09/30/2023 09:03    EKG: Sinus rhythm with right bundle branch block  ASSESSMENT AND PLAN:   1.  Chest pain, atypical, nondiagnostic ECG, with mildly elevated troponin, now chest pain-free, patient been ambulating without exertional chest pain 2.  Essential hypertension, blood pressure initially elevated, amlodipine held, started on metoprolol  tartrate, with good blood pressure control  Recommendations  1.  Agree with current therapy 2.  DC heparin 3.  If patient continues to ambulate without difficulty, without chest pain then consider discharge home 4.  Follow-up with me in 1 week at which time proceed with ischemia workup  Signed: Marcina Millard MD,PhD, [facility] 10/15/2023, 11:49 AM

## 2023-10-15 NOTE — Assessment & Plan Note (Signed)
 No acute issues suspected

## 2023-10-15 NOTE — Progress Notes (Signed)
  Echocardiogram 2D Echocardiogram has been performed. Definity IV ultrasound imaging agent used on this study.  Lindsey Foster 10/15/2023, 1:01 PM

## 2023-10-15 NOTE — IPAL (Signed)
  Interdisciplinary Goals of Care Family Meeting   Date carried out: 10/15/2023  Location of the meeting: Bedside  Member's involved: Physician, Bedside Registered Nurse, and Family Member or next of kin  Durable Power of Attorney or acting medical decision maker: patient, son    Discussion: We discussed goals of care for [employer] .   I have reviewed medical records including EPIC notes, labs and imaging, assessed the patient and then met with patient and son to discuss major active diagnoses, plan of care, natural trajectory, prognosis, GOC, EOL wishes, disposition and options including Full code/DNI/DNR and the concept of comfort care if DNR is elected. Questions and concerns were addressed. They are  in agreement to continue current plan of care . Election for full code status.   Code status:   Code Status: Full Code   Disposition: Continue current acute care  Time spent for the meeting: 25    Andris Baumann, MD  10/15/2023, 3:03 AM

## 2023-10-15 NOTE — Progress Notes (Signed)
 PHARMACY - ANTICOAGULATION CONSULT NOTE  Pharmacy Consult for Heparin  Indication: chest pain/ACS  Allergies  Allergen Reactions   Latex Rash    Patient Measurements: Weight: 81.6 kg (180 lb) Heparin Dosing Weight: 81.6 kg   Vital Signs: Temp: 98.1 F (36.7 C) (03/02 0012) Temp Source: Oral (03/02 0012) BP: 142/82 (03/02 0145) Pulse Rate: 89 (03/02 0145)  Labs: Recent Labs    10/15/23 0016  HGB 13.1  HCT 41.1  PLT 217  CREATININE 1.16*  TROPONINIHS 457*    Estimated Creatinine Clearance: 43.2 mL/min (A) (by C-G formula based on SCr of 1.16 mg/dL (H)).   Medical History: Past Medical History:  Diagnosis Date   Allergic rhinitis    Anxiety    panic attacks in hot rooms   Arthritis    Blood in stool    Breast cancer (HCC) 2006   radiation - Rt   Chickenpox    CKD (chronic kidney disease)    Colon polyps    Diverticulosis    GERD (gastroesophageal reflux disease)    Heart murmur    Hyperlipidemia    Hypertension    Hypothyroidism    Multiple thyroid nodules    Osteopenia    Personal history of radiation therapy    Urinary incontinence    UTI (lower urinary tract infection)    Vitamin D deficiency disease     Medications:  (Not in a hospital admission)   Assessment: Pharmacy consulted to dose heparin in this 78 year old female admitted with ACS/NSTEMI.  No prior anticoag noted. CrCl = 43.2 ml/min   Goal of Therapy:  Heparin level 0.3-0.7 units/ml Monitor platelets by anticoagulation protocol: Yes   Plan:  Give 4000 units bolus x 1 Start heparin infusion at 1000 units/hr Check anti-Xa level in 8 hours and daily while on heparin Continue to monitor H&H and platelets  Camile Esters D 10/15/2023,2:00 AM

## 2023-10-15 NOTE — Assessment & Plan Note (Signed)
 Regular follow-up with nephrologist, Dr. Cherylann Ratel. Recent improvement in GFR to 51. -Continue current management under nephrologist.

## 2023-10-15 NOTE — Assessment & Plan Note (Signed)
 Will hold home amlodipine Will start metoprolol

## 2023-10-16 LAB — LIPOPROTEIN A (LPA): Lipoprotein (a): 45.5 nmol/L — ABNORMAL HIGH (ref <75.0)

## 2023-10-17 ENCOUNTER — Telehealth: Payer: Self-pay

## 2023-10-17 NOTE — Telephone Encounter (Signed)
 Copied from CRM [id number]. Topic: General - Other >> Oct 17, 2023 10:33 AM Alcus Dad wrote: Reason for CRM: Patient stated that a Bone Density Test was ordered or her after visit form. Patient haven't heard anything in a week

## 2023-10-17 NOTE — Telephone Encounter (Signed)
 Spoke to pt. Informed pt that she could call and schedule with [place]. Gave the pt the number we have on file for [facility].

## 2023-10-18 DIAGNOSIS — R079 Chest pain, unspecified: Secondary | ICD-10-CM | POA: Diagnosis not present

## 2023-10-18 DIAGNOSIS — I10 Essential (primary) hypertension: Secondary | ICD-10-CM | POA: Diagnosis not present

## 2023-10-18 DIAGNOSIS — R002 Palpitations: Secondary | ICD-10-CM | POA: Diagnosis not present

## 2023-10-18 DIAGNOSIS — R0602 Shortness of breath: Secondary | ICD-10-CM | POA: Diagnosis not present

## 2023-10-18 DIAGNOSIS — N1831 Chronic kidney disease, stage 3a: Secondary | ICD-10-CM | POA: Diagnosis not present

## 2023-10-19 DIAGNOSIS — M25552 Pain in left hip: Secondary | ICD-10-CM | POA: Diagnosis not present

## 2023-10-20 ENCOUNTER — Encounter: Payer: Self-pay | Admitting: Family Medicine

## 2023-10-20 ENCOUNTER — Ambulatory Visit: Admitting: Family Medicine

## 2023-10-20 ENCOUNTER — Ambulatory Visit

## 2023-10-20 VITALS — BP 144/72 | HR 56 | Ht 67.0 in | Wt 180.0 lb

## 2023-10-20 DIAGNOSIS — M810 Age-related osteoporosis without current pathological fracture: Secondary | ICD-10-CM

## 2023-10-20 DIAGNOSIS — M25552 Pain in left hip: Secondary | ICD-10-CM

## 2023-10-20 DIAGNOSIS — S72002G Fracture of unspecified part of neck of left femur, subsequent encounter for closed fracture with delayed healing: Secondary | ICD-10-CM | POA: Diagnosis not present

## 2023-10-20 DIAGNOSIS — S72002A Fracture of unspecified part of neck of left femur, initial encounter for closed fracture: Secondary | ICD-10-CM | POA: Diagnosis not present

## 2023-10-20 DIAGNOSIS — M1612 Unilateral primary osteoarthritis, left hip: Secondary | ICD-10-CM | POA: Diagnosis not present

## 2023-10-20 NOTE — Assessment & Plan Note (Signed)
 Osteoporosis.  Need the bone density but will likely need Prolia or Affinity.

## 2023-10-20 NOTE — Patient Instructions (Signed)
 Will check with primary for Prolia Will see what Turner Daniels thinks about new images

## 2023-10-20 NOTE — Assessment & Plan Note (Signed)
 Patient is having difficulty.  Patient is frustrated and secondary to the delay in imaging initially.  Patient has seen Ortho and do feel that at this point they need to continue with conservative therapy.  X-ray shows some callus formation beginning but does seem to be extremely slow.  Discussed with patient about the vitamin D supplementation as well as the calcium.  Patient has had a history of osteoporosis and is scheduled for another bone density at the end of this month.  Do feel with this fracture without a fall she does need further treatment.  Patient has not been on bisphosphonates in a while and is having significant amount of teeth problems so would encourage her to consider Prolia.  Will send a message to primary care though to see if they are comfortable with starting this with her wanting to have it closer to home.  Can follow-up with me on a otherwise as needed basis for this.  Did discuss with her that I would like to continue to be an advocate for her though.  Patient is accompanied with her son today.  Total time with patient today 33 minutes

## 2023-10-20 NOTE — Progress Notes (Signed)
 [name] [address] [place] [address] Phone: [phone] Subjective:   [name], am serving as a scribe for Dr. Antoine Primas.  I'm seeing this patient by the request  of:  Dana Allan, MD  CC: Hip pain follow-up  [id number]  Lindsey Foster is a 78 y.o. female coming in with complaint of L hip pain. Patient states that if she sits for 10 minutes she will feel like she is sitting on a wallet over the glute and piriformis. Pain will radiate into the SI joint. L knee pain is more of an issue with her walking than her hip.   MRI L hip 09/22/2023 IMPRESSION: 1. Acute nondisplaced subcapital left femoral neck fracture with severe surrounding bone marrow edema. 2. Moderate partial-thickness cartilage loss of the femoral head and acetabulum bilaterally. 3. Degenerative tearing of the left anterosuperior labrum and severe degeneration of the superior labrum. 4. Multiple uterine fibroids with the largest measuring 2.2 cm. If there is further clinical concern, recommend a pelvic ultrasound.       Past Medical History:  Diagnosis Date   Allergic rhinitis    Anxiety    panic attacks in hot rooms   Arthritis    Blood in stool    Breast cancer (HCC) 2006   radiation - Rt   Chickenpox    CKD (chronic kidney disease)    Colon polyps    Diverticulosis    GERD (gastroesophageal reflux disease)    Heart murmur    Hyperlipidemia    Hypertension    Hypothyroidism    Multiple thyroid nodules    Osteopenia    Personal history of radiation therapy    Urinary incontinence    UTI (lower urinary tract infection)    Vitamin D deficiency disease    Past Surgical History:  Procedure Laterality Date   BLADDER SURGERY     BREAST EXCISIONAL BIOPSY Right 2006   positive   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   CESAREAN SECTION  1985   COLONOSCOPY N/A 04/30/2021   Procedure: COLONOSCOPY;  Surgeon: Regis Bill, MD;  Location:  [facility] ENDOSCOPY;  Service: Endoscopy;  Laterality: N/A;   COLONOSCOPY WITH PROPOFOL N/A 09/28/2015   Procedure: COLONOSCOPY WITH PROPOFOL;  Surgeon: Christena Deem, MD;  Location: [facility] ENDOSCOPY;  Service: Endoscopy;  Laterality: N/A;   DILATION AND CURETTAGE OF UTERUS  2012   ESOPHAGOGASTRODUODENOSCOPY (EGD) WITH PROPOFOL N/A 08/23/2022   Procedure: ESOPHAGOGASTRODUODENOSCOPY (EGD) WITH PROPOFOL;  Surgeon: Regis Bill, MD;  Location: [facility] ENDOSCOPY;  Service: Endoscopy;  Laterality: N/A;   Thyroid nodule ablation  2003   Uterine polyp removal  2009    Social History   Socioeconomic History   Marital status: Widowed    Spouse name: Not on file   Number of children: Not on file   Years of education: Not on file   Highest education level: Not on file  Occupational History   Not on file  Tobacco Use   Smoking status: Former   Smokeless tobacco: Never  Vaping Use   Vaping status: Never Used  Substance and Sexual Activity   Alcohol use: No    Alcohol/week: 0.0 standard drinks of alcohol   Drug use: No   Sexual activity: Not on file  Other Topics Concern   Not on file  Social History Narrative   Not on file   Social Drivers of Health   Financial Resource Strain: Low Risk  (  03/13/2023)   Overall Financial Resource Strain (CARDIA)    Difficulty of Paying Living Expenses: Not hard at all  Food Insecurity: No Food Insecurity (03/13/2023)   Hunger Vital Sign    Worried About Running Out of Food in the Last Year: Never true    Ran Out of Food in the Last Year: Never true  Transportation Needs: No Transportation Needs (03/13/2023)   PRAPARE - Administrator, Civil Service (Medical): No    Lack of Transportation (Non-Medical): No  Physical Activity: Sufficiently Active (03/13/2023)   Exercise Vital Sign    Days of Exercise per Week: 2 days    Minutes of Exercise per Session: 90 min  Stress: No Stress Concern Present (03/13/2023)   [employer] of Occupational  Health - Occupational Stress Questionnaire    Feeling of Stress : Not at all  Social Connections: Moderately Integrated (03/13/2023)   Social Connection and Isolation Panel [NHANES]    Frequency of Communication with Friends and Family: More than three times a week    Frequency of Social Gatherings with Friends and Family: More than three times a week    Attends Religious Services: More than 4 times per year    Active Member of [employer] or Organizations: Yes    Attends Banker Meetings: More than 4 times per year    Marital Status: Widowed   Allergies  Allergen Reactions   Latex Rash   Family History  Problem Relation Age of Onset   Angina Father    Heart attack Father    Multiple myeloma Brother    Uterine cancer Maternal Aunt    Heart disease Other        Parent   Breast cancer Neg Hx      Current Outpatient Medications (Cardiovascular):    amLODipine (NORVASC) 5 MG tablet, TAKE 1 TABLET(5 MG) BY MOUTH DAILY   metoprolol tartrate (LOPRESSOR) 25 MG tablet, Take 0.5 tablets (12.5 mg total) by mouth 2 (two) times daily.   nitroGLYCERIN (NITROSTAT) 0.4 MG SL tablet, Place 1 tablet (0.4 mg total) under the tongue every 5 (five) minutes x 3 doses as needed for chest pain.  Current Outpatient Medications (Respiratory):    fluticasone (FLONASE SENSIMIST) 27.5 MCG/SPRAY nasal spray, Place 2 sprays into the nose daily.   loratadine (CLARITIN) 10 MG tablet, Take 1 tablet (10 mg total) by mouth daily.  Current Outpatient Medications (Analgesics):    aspirin EC 81 MG tablet, Take 1 tablet (81 mg total) by mouth daily. Swallow whole.   Current Outpatient Medications (Other):    Calcium Citrate-Vitamin D3 1000-400 LIQD, Take 1 tablet by mouth daily. Reported on 02/04/2016   clobetasol cream (TEMOVATE) 0.05 %, APPLY TOPICALLY TO THE AFFECTED AREA(S) TWICE DAILY FOR 2 WEEKS, THEN JUST ON WEEK DAYS AS NEEDED   Crisaborole (EUCRISA) 2 % OINT, Apply to affected skin once a day    famotidine (PEPCID) 20 MG tablet, TAKE 1 TABLET(20 MG) BY MOUTH TWICE DAILY BEFORE A MEAL (Patient taking differently: TAKE 1 TABLET(20 MG) BY MOUTH TWICE DAILY BEFORE A MEAL as needed)   Garlic 100 MG TABS, Take 1 tablet by mouth daily.    ketotifen (ZADITOR) 0.025 % ophthalmic solution, As needed   mometasone (ELOCON) 0.1 % lotion, Apply topically daily.   Reviewed prior external information including notes and imaging from  primary care provider As well as notes that were available from care everywhere and other healthcare systems.  Past medical history, social, surgical  and family history all reviewed in electronic medical record.  No pertanent information unless stated regarding to the chief complaint.   Review of Systems:  No headache, visual changes, nausea, vomiting, diarrhea, constipation, dizziness, abdominal pain, skin rash, fevers, chills, night sweats, weight loss, swollen lymph nodes, body aches, joint swelling, chest pain, shortness of breath, mood changes. POSITIVE muscle aches  Objective  Blood pressure (!) 144/72, pulse (!) 56, height 5\' 7"  (1.702 m), weight 180 lb (81.6 kg), SpO2 97%.   General: No apparent distress alert and oriented x3 mood and affect normal, dressed appropriately.  HEENT: Pupils equal, extraocular movements intact  Respiratory: Patient's speak in full sentences and does not appear short of breath  Severely antalgic walking with the aid of a walker.  Patient's left hip has 0 degrees of internal rotation noted.  Negative straight leg test noted.  Arthritic changes noted of the left knee noted.   Impression and Recommendations:    The above documentation has been reviewed and is accurate and complete Judi Saa, DO

## 2023-10-23 ENCOUNTER — Encounter: Payer: Self-pay | Admitting: Family Medicine

## 2023-10-24 ENCOUNTER — Ambulatory Visit: Payer: Medicare Other | Admitting: Family Medicine

## 2023-10-24 DIAGNOSIS — R0602 Shortness of breath: Secondary | ICD-10-CM | POA: Diagnosis not present

## 2023-10-24 DIAGNOSIS — R0789 Other chest pain: Secondary | ICD-10-CM | POA: Diagnosis not present

## 2023-11-07 ENCOUNTER — Ambulatory Visit
Admission: RE | Admit: 2023-11-07 | Discharge: 2023-11-07 | Disposition: A | Discharge status: Home / Self Care | Source: Ambulatory Visit | Attending: Family Medicine | Admitting: Family Medicine

## 2023-11-07 DIAGNOSIS — Z780 Asymptomatic menopausal state: Secondary | ICD-10-CM | POA: Insufficient documentation

## 2023-11-07 DIAGNOSIS — R002 Palpitations: Secondary | ICD-10-CM | POA: Diagnosis not present

## 2023-11-07 DIAGNOSIS — I10 Essential (primary) hypertension: Secondary | ICD-10-CM | POA: Diagnosis not present

## 2023-11-07 DIAGNOSIS — R0789 Other chest pain: Secondary | ICD-10-CM | POA: Diagnosis not present

## 2023-11-07 DIAGNOSIS — M810 Age-related osteoporosis without current pathological fracture: Secondary | ICD-10-CM | POA: Diagnosis not present

## 2023-11-07 DIAGNOSIS — E7849 Other hyperlipidemia: Secondary | ICD-10-CM | POA: Diagnosis not present

## 2023-11-07 DIAGNOSIS — R0602 Shortness of breath: Secondary | ICD-10-CM | POA: Diagnosis not present

## 2023-11-07 DIAGNOSIS — N1831 Chronic kidney disease, stage 3a: Secondary | ICD-10-CM | POA: Diagnosis not present

## 2023-11-08 ENCOUNTER — Encounter: Payer: Self-pay | Admitting: Family Medicine

## 2023-11-09 DIAGNOSIS — M25552 Pain in left hip: Secondary | ICD-10-CM | POA: Diagnosis not present

## 2023-11-14 ENCOUNTER — Ambulatory Visit (INDEPENDENT_AMBULATORY_CARE_PROVIDER_SITE_OTHER): Admitting: Family Medicine

## 2023-11-14 ENCOUNTER — Encounter: Payer: Self-pay | Admitting: Family Medicine

## 2023-11-14 VITALS — BP 150/72 | HR 64 | Temp 98.3°F | Resp 20 | Ht 67.0 in | Wt 184.2 lb

## 2023-11-14 DIAGNOSIS — F410 Panic disorder [episodic paroxysmal anxiety] without agoraphobia: Secondary | ICD-10-CM

## 2023-11-14 DIAGNOSIS — M810 Age-related osteoporosis without current pathological fracture: Secondary | ICD-10-CM | POA: Diagnosis not present

## 2023-11-14 DIAGNOSIS — E890 Postprocedural hypothyroidism: Secondary | ICD-10-CM | POA: Diagnosis not present

## 2023-11-14 DIAGNOSIS — E538 Deficiency of other specified B group vitamins: Secondary | ICD-10-CM

## 2023-11-14 DIAGNOSIS — E782 Mixed hyperlipidemia: Secondary | ICD-10-CM | POA: Diagnosis not present

## 2023-11-14 DIAGNOSIS — Z8639 Personal history of other endocrine, nutritional and metabolic disease: Secondary | ICD-10-CM | POA: Diagnosis not present

## 2023-11-14 DIAGNOSIS — E559 Vitamin D deficiency, unspecified: Secondary | ICD-10-CM

## 2023-11-14 DIAGNOSIS — I10 Essential (primary) hypertension: Secondary | ICD-10-CM

## 2023-11-14 LAB — COMPREHENSIVE METABOLIC PANEL WITH GFR
ALT: 12 U/L (ref 0–35)
AST: 14 U/L (ref 0–37)
Albumin: 4.2 g/dL (ref 3.5–5.2)
Alkaline Phosphatase: 56 U/L (ref 39–117)
BUN: 15 mg/dL (ref 6–23)
CO2: 31 meq/L (ref 19–32)
Calcium: 9.7 mg/dL (ref 8.4–10.5)
Chloride: 103 meq/L (ref 96–112)
Creatinine, Ser: 1.34 mg/dL — ABNORMAL HIGH (ref 0.40–1.20)
GFR: 37.59 mL/min — ABNORMAL LOW (ref >60.00)
Glucose, Bld: 94 mg/dL (ref 70–99)
Potassium: 4.3 meq/L (ref 3.5–5.1)
Sodium: 141 meq/L (ref 135–145)
Total Bilirubin: 0.5 mg/dL (ref 0.2–1.2)
Total Protein: 7.1 g/dL (ref 6.0–8.3)

## 2023-11-14 LAB — CBC WITH DIFFERENTIAL/PLATELET
Basophils Absolute: 0 10*3/uL (ref 0.0–0.1)
Basophils Relative: 1 % (ref 0.0–3.0)
Eosinophils Absolute: 0.3 10*3/uL (ref 0.0–0.7)
Eosinophils Relative: 8.8 % — ABNORMAL HIGH (ref 0.0–5.0)
HCT: 39.3 % (ref 36.0–46.0)
Hemoglobin: 12.5 g/dL (ref 12.0–15.0)
Lymphocytes Relative: 29 % (ref 12.0–46.0)
Lymphs Abs: 1.1 10*3/uL (ref 0.7–4.0)
MCHC: 31.9 g/dL (ref 30.0–36.0)
MCV: 84.9 fl (ref 78.0–100.0)
Monocytes Absolute: 0.4 10*3/uL (ref 0.1–1.0)
Monocytes Relative: 11.1 % (ref 3.0–12.0)
Neutro Abs: 1.9 10*3/uL (ref 1.4–7.7)
Neutrophils Relative %: 50.1 % (ref 43.0–77.0)
Platelets: 186 10*3/uL (ref 150.0–400.0)
RBC: 4.63 Mil/uL (ref 3.87–5.11)
RDW: 15.4 % (ref 11.5–15.5)
WBC: 3.8 10*3/uL — ABNORMAL LOW (ref 4.0–10.5)

## 2023-11-14 LAB — LIPID PANEL
Cholesterol: 204 mg/dL — ABNORMAL HIGH (ref 0–200)
HDL: 68.8 mg/dL (ref >39.00)
LDL Cholesterol: 121 mg/dL — ABNORMAL HIGH (ref 0–99)
NonHDL: 135.16
Total CHOL/HDL Ratio: 3
Triglycerides: 70 mg/dL (ref 0.0–149.0)
VLDL: 14 mg/dL (ref 0.0–40.0)

## 2023-11-14 LAB — VITAMIN D 25 HYDROXY (VIT D DEFICIENCY, FRACTURES): VITD: 38.94 ng/mL (ref 30.00–100.00)

## 2023-11-14 LAB — VITAMIN B12: Vitamin B-12: 348 pg/mL (ref 211–911)

## 2023-11-14 MED ORDER — AMLODIPINE BESYLATE 5 MG PO TABS
5.0000 mg | ORAL_TABLET | Freq: Every day | ORAL | 1 refills | Status: DC
Start: 2023-11-14 — End: 2024-07-08

## 2023-11-14 NOTE — Patient Instructions (Addendum)
 It was a pleasure meeting you today. Thank you for allowing me to take part in your health care.  Our goals for today as we discussed include:  We will get some labs today.  If they are abnormal or we need to do something about them, I will call you.  If they are normal, I will send you a message on MyChart (if it is active) or a letter in the mail.  If you don't hear from [place] in 2 weeks, please call the office at the number below.   Will notify you about Prolia  Follow up for discussion of anxiety  Follow up with Cardiology as scheduled Restart Amlodipine 5 mg at night for blood pressure.  Can call Cardiology to check if this was discontinued.   This is a list of the screening recommended for you and due dates:  Health Maintenance  Topic Date Due   DTaP/Tdap/Td vaccine (1 - Tdap) Never done   Zoster (Shingles) Vaccine (1 of 2) Never done   COVID-19 Vaccine (5 - 2024-25 season) 04/16/2023   Pneumonia Vaccine (1 of 2 - PCV) 10/09/2024*   Medicare Annual Wellness Visit  03/12/2024   Flu Shot  03/15/2024   Colon Cancer Screening  04/30/2026   DEXA scan (bone density measurement)  Completed   HPV Vaccine  Aged Out  *Topic was postponed. The date shown is not the original due date.     If you have any questions or concerns, please do not hesitate to call the office at [phone].  I look forward to our next visit and until then take care and stay safe.  Regards,   Dana Allan, MD   [facility]

## 2023-11-14 NOTE — Progress Notes (Signed)
 SUBJECTIVE:   Chief Complaint  Patient presents with   Hospitalization Follow-up   HPI Presents for discussion of Prolia  Discussed the use of AI scribe software for clinical note transcription with the patient, who gave verbal consent to proceed.  History of Present Illness Lindsey Foster is a 78 year old female with osteoporosis who presents for discussion about starting Prolia and coordination of dental procedures.  She is seeking medical advice regarding the initiation of Prolia for osteoporosis management and its coordination with upcoming dental procedures. She has a history of a hip fracture and is concerned about the timing of starting Prolia in relation to her dental implants. Her dentist is awaiting clearance from her primary care provider before proceeding with the implants. She has three dental implants waiting to be placed, but her dentist is hesitant to proceed without confirmation from her primary care provider. She has not received any communication from her dentist regarding this matter. She mentions that her dentist is waiting for contact from her medical team to coordinate the procedure.  She has a history of panic attacks, which began after a thyroid condition following the birth of her last child. Her thyroid was ablated in 2000 due to subclinical hyperthyroidism. She experiences panic attacks characterized by waking up feeling unable to breathe, with a recent episode resulting in a blood pressure reading of 192/73 mmHg and a pulse of 54 bpm. She is unsure of her current thyroid status and has difficulty obtaining comprehensive thyroid testing.  She is currently on metoprolol, which was started by her cardiologist, and she reports variable blood pressure readings at home. She has a history of hyperthyroidism, which was treated with radioactive iodine ablation, and she is concerned about the adequacy of her current thyroid monitoring. She has not been taking her  Amlodipine as she reports was told to stop this.    PERTINENT PMH / PSH: As above  OBJECTIVE:  BP (!) 150/72   Pulse 64   Temp 98.3 F (36.8 C)   Resp 20   Ht 5\' 7"  (1.702 m)   Wt 184 lb 4 oz (83.6 kg)   SpO2 98%   BMI 28.86 kg/m    Physical Exam Vitals reviewed.  Constitutional:      General: She is not in acute distress.    Appearance: Normal appearance. She is not ill-appearing, toxic-appearing or diaphoretic.  Eyes:     General:        Right eye: No discharge.        Left eye: No discharge.     Conjunctiva/sclera: Conjunctivae normal.  Cardiovascular:     Rate and Rhythm: Normal rate and regular rhythm.     Heart sounds: Normal heart sounds.  Pulmonary:     Effort: Pulmonary effort is normal.     Breath sounds: Normal breath sounds.  Abdominal:     General: Bowel sounds are normal.  Musculoskeletal:        General: Normal range of motion.  Skin:    General: Skin is warm and dry.  Neurological:     General: No focal deficit present.     Mental Status: She is alert and oriented to person, place, and time. Mental status is at baseline.  Psychiatric:        Mood and Affect: Mood normal.        Behavior: Behavior normal.        Thought Content: Thought content normal.  Judgment: Judgment normal.           11/14/2023   10:52 AM 10/10/2023   10:17 AM 08/10/2023   11:55 AM 04/04/2023   10:20 AM 03/13/2023   12:24 PM  Depression screen PHQ 2/9  Decreased Interest 0 0 0 0 0  Down, Depressed, Hopeless 0 0 0 0 0  PHQ - 2 Score 0 0 0 0 0  Altered sleeping 1 0 0 0 0  Tired, decreased energy 0 0 1 0 0  Change in appetite 0 0 0 0 0  Feeling bad or failure about yourself  0 0 0 0 0  Trouble concentrating 1 0 1 0 0  Moving slowly or fidgety/restless 0 0 0 0 0  Suicidal thoughts 0 0 0 0 0  PHQ-9 Score 2 0 2 0 0  Difficult doing work/chores Not difficult at all Not difficult at all Not difficult at all Not difficult at all Not difficult at all      11/14/2023    10:52 AM 10/10/2023   10:17 AM 08/10/2023   11:55 AM 04/04/2023   10:20 AM  GAD 7 : Generalized Anxiety Score  Nervous, Anxious, on Edge 1 0 1 0  Control/stop worrying 1 0 0 0  Worry too much - different things 1 0 1 0  Trouble relaxing 1 0 0 0  Restless 1 0 0 0  Easily annoyed or irritable 1 0 1 0  Afraid - awful might happen 1 0 0 0  Total GAD 7 Score 7 0 3 0  Anxiety Difficulty Not difficult at all Not difficult at all Not difficult at all Not difficult at all    ASSESSMENT/PLAN:  Osteoporosis without current pathological fracture, unspecified osteoporosis type Assessment & Plan: Osteoporosis with a nondisplaced hip fracture. DEXA scan shows a T-score of -3.2. No surgery performed; under orthopedic follow-up. Bisphosphonates pose a risk of osteonecrosis of the jaw, but fracture risk is higher. Concerns about dental implants and Prolia's impact on the procedure. Dentist's input required for timing of dental procedure relative to Prolia initiation. Forteo considered as an alternative but due to its daily injection may not be feasible for patient. - Order vitamin D and creatinine levels before starting Prolia - Provide information on Prolia and Forteo    Primary hypertension Assessment & Plan: Blood pressure recorded at 160/70 mmHg in office, with home reading of 192/73 mmHg during a panic attack. On metoprolol, initiated by cardiologist, with variable home blood pressure readings. - Monitor blood pressure at home - Consult cardiologist if blood pressure issues persist - Restart Amlodipine 5 mg   Orders: -     Comprehensive metabolic panel with GFR -     CBC with Differential/Platelet -     amLODIPine Besylate; Take 1 tablet (5 mg total) by mouth daily.  Dispense: 90 tablet; Refill: 1  Postablative hypothyroidism Assessment & Plan: Patient reports history of subclinical hyperthyroidism treated with radioactive iodine ablation. Concern about thyroid function's link to panic  attacks. - Order thyroid panel including TSH, T3, T4, free T3, and free T4 - Evaluate thyroid function and consider endocrinologist referral if abnormalities are found    Orders: -     Thyroid Panel With TSH  History of thyroid nodule -     Thyroid Panel With TSH  Vitamin B 12 deficiency -     Vitamin B12  Vitamin D deficiency -     VITAMIN D 25 Hydroxy (Vit-D Deficiency, Fractures)  Mixed hyperlipidemia -  Lipid panel  Panic attacks Assessment & Plan: Panic attacks since thyroid condition post-childbirth. Episodes occur, especially when waking in a hot room, with a recent episode causing blood pressure of 192/73 mmHg.  - Consider anxiety medication if thyroid function is normal - Discuss therapy options for anxiety management     PDMP reviewed  Return if symptoms worsen or fail to improve, for PCP.  Dana Allan, MD

## 2023-11-15 LAB — THYROID PANEL WITH TSH
Free Thyroxine Index: 2.3 (ref 1.4–3.8)
T3 Uptake: 32 % (ref 22–35)
T4, Total: 7.1 ug/dL (ref 5.1–11.9)
TSH: 1.71 m[IU]/L (ref 0.40–4.50)

## 2023-11-19 ENCOUNTER — Encounter: Payer: Self-pay | Admitting: Family Medicine

## 2023-11-19 DIAGNOSIS — F410 Panic disorder [episodic paroxysmal anxiety] without agoraphobia: Secondary | ICD-10-CM | POA: Insufficient documentation

## 2023-11-19 NOTE — Assessment & Plan Note (Signed)
 Patient reports history of subclinical hyperthyroidism treated with radioactive iodine ablation. Concern about thyroid function's link to panic attacks. - Order thyroid panel including TSH, T3, T4, free T3, and free T4 - Evaluate thyroid function and consider endocrinologist referral if abnormalities are found

## 2023-11-19 NOTE — Assessment & Plan Note (Signed)
 Osteoporosis with a nondisplaced hip fracture. DEXA scan shows a T-score of -3.2. No surgery performed; under orthopedic follow-up. Bisphosphonates pose a risk of osteonecrosis of the jaw, but fracture risk is higher. Concerns about dental implants and Prolia's impact on the procedure. Dentist's input required for timing of dental procedure relative to Prolia initiation. Forteo considered as an alternative but due to its daily injection may not be feasible for patient. - Order vitamin D and creatinine levels before starting Prolia - Provide information on Prolia and Forteo

## 2023-11-19 NOTE — Assessment & Plan Note (Signed)
 Blood pressure recorded at 160/70 mmHg in office, with home reading of 192/73 mmHg during a panic attack. On metoprolol, initiated by cardiologist, with variable home blood pressure readings. - Monitor blood pressure at home - Consult cardiologist if blood pressure issues persist - Restart Amlodipine 5 mg

## 2023-11-19 NOTE — Assessment & Plan Note (Addendum)
 Panic attacks since thyroid condition post-childbirth. Episodes occur, especially when waking in a hot room, with a recent episode causing blood pressure of 192/73 mmHg.  - Consider anxiety medication if thyroid function is normal - Discuss therapy options for anxiety management

## 2023-11-21 ENCOUNTER — Telehealth: Payer: Self-pay

## 2023-11-21 NOTE — Telephone Encounter (Signed)
 Copied from CRM [id number]. Topic: Clinical - Medical Advice >> Nov 21, 2023  3:35 PM Saverio Danker wrote: Reason for CRM: Patient is calling because she would like advice on the best specialist she could see  like a Rheumatologists or  Endocrinologists. Whatever is best for her osteoporosis. She is trying to get dental work done but they need the okay from the doctors office first

## 2023-11-23 ENCOUNTER — Inpatient Hospital Stay: Admitting: Family Medicine

## 2023-11-24 NOTE — Telephone Encounter (Signed)
 Pt wanted to have a referral sent to Osteoporosis clinic before she has the dental procedure done.

## 2023-11-24 NOTE — Telephone Encounter (Signed)
 Called patient reviewed all information and repeated back to me. Will call if any questions.  ? ?

## 2023-11-24 NOTE — Telephone Encounter (Signed)
 Copied from CRM [id number]. Topic: Clinical - Medical Advice >> Nov 24, 2023 10:20 AM Drema Balzarine wrote: Reason for CRM: patient says that Dr. Clent Ridges and her dental office has been in touch regarding dental implants and wants to know who would be the one to refer her to the Osteoporosis Clinic and how soon she would be able to get an appointment. She request a call back today at [phone]

## 2023-11-28 ENCOUNTER — Other Ambulatory Visit: Payer: Self-pay

## 2023-11-28 DIAGNOSIS — M810 Age-related osteoporosis without current pathological fracture: Secondary | ICD-10-CM

## 2023-11-28 NOTE — Telephone Encounter (Signed)
 Can we please get pt scheduled for osteoporosis visit with Dr. Alease Hunter.   Thank you!

## 2023-11-29 ENCOUNTER — Telehealth: Payer: Self-pay

## 2023-11-29 ENCOUNTER — Telehealth: Payer: Self-pay | Admitting: Physician Assistant

## 2023-11-29 NOTE — Telephone Encounter (Signed)
 Noted.

## 2023-11-29 NOTE — Telephone Encounter (Signed)
 Copied from CRM [id number]. Topic: Referral - Question >> Nov 29, 2023  9:32 AM Jethro Morrison wrote: Reason for CRM: THE PATIENT CALLED REQUESTING INFORMATION REGARDING HER REFERRAL FOR Osteoporosis. STATED SHE WOULD LIKE THIS DONE AS SOON AS POSSIBLE

## 2023-11-29 NOTE — Telephone Encounter (Signed)
 Patient would like to speak with Norma Beckers. Her cb# [phone]

## 2023-12-06 ENCOUNTER — Ambulatory Visit: Admitting: Family Medicine

## 2023-12-07 DIAGNOSIS — M25552 Pain in left hip: Secondary | ICD-10-CM | POA: Diagnosis not present

## 2023-12-13 DIAGNOSIS — N1831 Chronic kidney disease, stage 3a: Secondary | ICD-10-CM | POA: Diagnosis not present

## 2023-12-13 DIAGNOSIS — I10 Essential (primary) hypertension: Secondary | ICD-10-CM | POA: Diagnosis not present

## 2023-12-18 DIAGNOSIS — N2581 Secondary hyperparathyroidism of renal origin: Secondary | ICD-10-CM | POA: Diagnosis not present

## 2023-12-18 DIAGNOSIS — I10 Essential (primary) hypertension: Secondary | ICD-10-CM | POA: Diagnosis not present

## 2023-12-18 DIAGNOSIS — N1831 Chronic kidney disease, stage 3a: Secondary | ICD-10-CM | POA: Diagnosis not present

## 2023-12-20 ENCOUNTER — Ambulatory Visit (INDEPENDENT_AMBULATORY_CARE_PROVIDER_SITE_OTHER): Admitting: Physician Assistant

## 2023-12-20 ENCOUNTER — Encounter: Payer: Self-pay | Admitting: Physician Assistant

## 2023-12-20 VITALS — Ht 67.0 in | Wt 184.0 lb

## 2023-12-20 DIAGNOSIS — M8000XA Age-related osteoporosis with current pathological fracture, unspecified site, initial encounter for fracture: Secondary | ICD-10-CM

## 2023-12-20 NOTE — Progress Notes (Signed)
 Office Visit Note   Patient: Lindsey Foster           Date of Birth: 08-31-1945           MRN: [id number] Visit Date: 12/20/2023              Requested by: Isidro Margo, DO [address] [place],  [place] [address] PCP: Valli Gaw, MD   Assessment & Plan: Visit Diagnoses: Osteoporosis  Plan: Patient is a pleasant 78 year old woman who comes in today to discuss osteoporosis treatment.  She did take Fosamax about 23 years ago.  She does currently have a fragility fracture of her femoral neck that has been treated with conservative treatment by another orthopedist.  She has not had any recent history of stroke or heart attack.  She does see a cardiologist.  She did have breast cancer several years ago.  She has early kidney disease.  She has been told not to take anti-inflammatories.  No history of GI issues.  When she was on the Fosamax however she did have significant reflux and difficulty tolerating it.  She went through menopause at age 26.  She does take 1300 mg of calcium and 1000 international units of vitamin D  a day.  She was on hormone replacement therapy.  She is a former smoker and does not drink.  She previously did water aerobics however this was the cause of her fracture to her hip as she was externally rotating her leg.  And she does some limited walking.  Does not have a family history of spine fracture.  Her most recent bone density scan was -3.2 for T-score.  That gives her a 49% chance of a major osteoporotic fracture in the next 10 years.  I think the best medication for her now based on her medical history would be Prolia.  Bisphosphonates would not be appropriate as she was on those in the past did not seem to help her she had severe renal reflux and she does have kidney disease.  Would be cautious in Evenity because she does see a cardiologist has some cardiac issues and with her history of breast cancer would not go with Tymlos.  Spent 40 minutes with her  discussing treatment options.  She is undergoing dental implants and major dental work.  I would not advise starting the Prolia until she is about 60 days after completing the dental treatment.  May follow-up with me then  Follow-Up Instructions: No follow-ups on file.   Orders:  No orders of the defined types were placed in this encounter.  No orders of the defined types were placed in this encounter.     Procedures: No procedures performed   Clinical Data: No additional findings.   Subjective: Chief Complaint  Patient presents with   Osteoporosis    HPI patient is a pleasant 78 year old woman who is referred for evaluation treatment of osteoporosis.  She does have a history of a fragility fracture of the femoral neck.  She had a recent labs and a bone density scan which find a T-score of -3.2  Review of Systems  All other systems reviewed and are negative.    Objective: Vital Signs: Ht 5\' 7"  (1.702 m)   Wt 184 lb (83.5 kg)   BMI 28.82 kg/m   Physical Exam Constitutional:      Appearance: Normal appearance.  Pulmonary:     Effort: Pulmonary effort is normal.  Skin:    General: Skin  is warm and dry.  Neurological:     General: No focal deficit present.     Mental Status: She is alert and oriented to person, place, and time.  Psychiatric:        Mood and Affect: Mood normal.        Behavior: Behavior normal.       Specialty Comments:  No specialty comments available.  Imaging: No results found.   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   Itching 02/08/2022   Taste sensation disturbance 02/08/2022   Skin breakdown 11/09/2021   Submandibular gland swelling 11/09/2021   Cerebral microvascular disease 08/03/2021   Skin lesion 08/03/2021   New onset of headaches after age 67 05/10/2021   Constipation 11/06/2020   Right low back pain 11/06/2020   Umbilical hernia 05/09/2020   Arthritis of left ankle 11/13/2019   Bilateral leg edema 11/06/2019   Left foot pain 11/06/2019   Hot flashes 11/06/2019   Stage 3a chronic kidney disease (HCC) 06/23/2019   Vitamin D  deficiency 06/23/2019   Degenerative arthritis of knee 02/17/2019   Vertigo 02/17/2019   Pain in toes of both feet 02/17/2019   Costochondritis 08/01/2017   Urge incontinence 03/16/2017   Allergic rhinitis 08/12/2016   Overweight 08/12/2016   Shortness of breath when humid outside 02/04/2016   History of thyroid  nodule 02/04/2016   Anxiety 11/01/2015   HLD (hyperlipidemia) 07/30/2015   HTN (hypertension) 07/30/2015   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   Stiffness of left knee 07/30/2015   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   Osteoporosis 07/20/2015   GERD (gastroesophageal reflux disease) 06/30/2015   Eczema 06/30/2015   Blood in stool 06/30/2015   Muscle cramping 06/30/2015   Melena 06/30/2015   Kidney disease 07/07/2014   Chest pain 07/07/2014   Palpitation 07/07/2014   Past Medical History:  Diagnosis Date   Allergic rhinitis    Anxiety    panic attacks in hot rooms   Arthritis    Blood in stool    Breast cancer (HCC) 2006   radiation - Rt   Chickenpox    CKD (chronic kidney disease)    Colon polyps    Diverticulosis    GERD (gastroesophageal reflux disease)    Heart murmur    Hyperlipidemia    Hypertension    Hypothyroidism    Multiple thyroid  nodules    Osteopenia     Personal history of radiation therapy    Urinary incontinence    UTI (lower urinary tract infection)    Vitamin D  deficiency disease     Family History  Problem Relation Age of Onset   Angina Father    Heart attack Father    Multiple myeloma Brother    Uterine cancer Maternal Aunt    Heart disease Other        Parent   Breast cancer Neg Hx  Past Surgical History:  Procedure Laterality Date   BLADDER SURGERY     BREAST EXCISIONAL BIOPSY Right 2006   positive   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   CESAREAN SECTION  1985   COLONOSCOPY N/A 04/30/2021   Procedure: COLONOSCOPY;  Surgeon: Shane Darling, MD;  Location: [facility] ENDOSCOPY;  Service: Endoscopy;  Laterality: N/A;   COLONOSCOPY WITH PROPOFOL  N/A 09/28/2015   Procedure: COLONOSCOPY WITH PROPOFOL ;  Surgeon: Deveron Fly, MD;  Location: [facility] ENDOSCOPY;  Service: Endoscopy;  Laterality: N/A;   DILATION AND CURETTAGE OF UTERUS  2012   ESOPHAGOGASTRODUODENOSCOPY (EGD) WITH PROPOFOL  N/A 08/23/2022   Procedure: ESOPHAGOGASTRODUODENOSCOPY (EGD) WITH PROPOFOL ;  Surgeon: Shane Darling, MD;  Location: [facility] ENDOSCOPY;  Service: Endoscopy;  Laterality: N/A;   Thyroid  nodule ablation  2003   Uterine polyp removal  2009    Social History   Occupational History   Not on file  Tobacco Use   Smoking status: Former   Smokeless tobacco: Never  Vaping Use   Vaping status: Never Used  Substance and Sexual Activity   Alcohol use: No    Alcohol/week: 0.0 standard drinks of alcohol   Drug use: No   Sexual activity: Not on file

## 2024-01-10 ENCOUNTER — Ambulatory Visit (INDEPENDENT_AMBULATORY_CARE_PROVIDER_SITE_OTHER): Admitting: Family Medicine

## 2024-01-10 ENCOUNTER — Encounter: Payer: Self-pay | Admitting: Family Medicine

## 2024-01-10 VITALS — BP 138/82 | HR 61 | Ht 67.0 in | Wt 184.0 lb

## 2024-01-10 DIAGNOSIS — M1712 Unilateral primary osteoarthritis, left knee: Secondary | ICD-10-CM

## 2024-01-10 NOTE — Patient Instructions (Signed)
 Injected L knee

## 2024-01-10 NOTE — Assessment & Plan Note (Signed)
 Arthritic changes noted.  Discussed icing regimen and home exercises, continuing to monitor closely.  Working on weight loss and doing a great job.  Will consider the possibility of other intervention if needed.  Has responded to viscosupplementation previously as well and we will see if she can get that approved before she goes on her CRUISE.  FOLLOW-UP WITH ME AGAIN IN 6 TO 12 WEEKS

## 2024-01-10 NOTE — Progress Notes (Signed)
 [facility] [address] [place] [address] Phone: [phone] Subjective:   Delwyn Filippo, am serving as a scribe for Dr. Ronnell Coins.  I'm seeing this patient by the request  of:  Valli Gaw, MD  CC: left knee pain follow up   [id number]  10/20/2023 Patient is having difficulty.  Patient is frustrated and secondary to the delay in imaging initially.  Patient has seen Ortho and do feel that at this point they need to continue with conservative therapy.  X-ray shows some callus formation beginning but does seem to be extremely slow.  Discussed with patient about the vitamin D  supplementation as well as the calcium.  Patient has had a history of osteoporosis and is scheduled for another bone density at the end of this month.  Do feel with this fracture without a fall she does need further treatment.  Patient has not been on bisphosphonates in a while and is having significant amount of teeth problems so would encourage her to consider Prolia.  Will send a message to primary care though to see if they are comfortable with starting this with her wanting to have it closer to home.  Can follow-up with me on a otherwise as needed basis for this.  Did discuss with her that I would like to continue to be an advocate for her though.  Patient is accompanied with her son today.  Total time with patient today 33 minutes     Update 01/18/2024 Elira Colasanti is a 78 y.o. female coming in with complaint of L hip pain. Patient states that her hip is pain free. Has slight pain if she twists.   L knee pain over anterior aspect. Last injection in January 14th, 2025.      Past Medical History:  Diagnosis Date   Allergic rhinitis    Anxiety    panic attacks in hot rooms   Arthritis    Blood in stool    Breast cancer (HCC) 2006   radiation - Rt   Chickenpox    CKD (chronic kidney disease)    Colon polyps    Diverticulosis    GERD (gastroesophageal  reflux disease)    Heart murmur    Hyperlipidemia    Hypertension    Hypothyroidism    Multiple thyroid  nodules    Osteopenia    Personal history of radiation therapy    Urinary incontinence    UTI (lower urinary tract infection)    Vitamin D  deficiency disease    Past Surgical History:  Procedure Laterality Date   BLADDER SURGERY     BREAST EXCISIONAL BIOPSY Right 2006   positive   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   CESAREAN SECTION  1985   COLONOSCOPY N/A 04/30/2021   Procedure: COLONOSCOPY;  Surgeon: Shane Darling, MD;  Location: [facility] ENDOSCOPY;  Service: Endoscopy;  Laterality: N/A;   COLONOSCOPY WITH PROPOFOL  N/A 09/28/2015   Procedure: COLONOSCOPY WITH PROPOFOL ;  Surgeon: Deveron Fly, MD;  Location: [facility] ENDOSCOPY;  Service: Endoscopy;  Laterality: N/A;   DILATION AND CURETTAGE OF UTERUS  2012   ESOPHAGOGASTRODUODENOSCOPY (EGD) WITH PROPOFOL  N/A 08/23/2022   Procedure: ESOPHAGOGASTRODUODENOSCOPY (EGD) WITH PROPOFOL ;  Surgeon: Shane Darling, MD;  Location: [facility] ENDOSCOPY;  Service: Endoscopy;  Laterality: N/A;   Thyroid  nodule ablation  2003   Uterine polyp removal  2009    Social History   Socioeconomic History   Marital status: Widowed    Spouse  name: Not on file   Number of children: Not on file   Years of education: Not on file   Highest education level: Not on file  Occupational History   Not on file  Tobacco Use   Smoking status: Former   Smokeless tobacco: Never  Vaping Use   Vaping status: Never Used  Substance and Sexual Activity   Alcohol use: No    Alcohol/week: 0.0 standard drinks of alcohol   Drug use: No   Sexual activity: Not on file  Other Topics Concern   Not on file  Social History Narrative   Not on file   Social Drivers of Health   Financial Resource Strain: Low Risk  (03/13/2023)   Overall Financial Resource Strain (CARDIA)    Difficulty of Paying Living Expenses: Not hard at all  Food Insecurity: No Food Insecurity  (03/13/2023)   Hunger Vital Sign    Worried About Running Out of Food in the Last Year: Never true    Ran Out of Food in the Last Year: Never true  Transportation Needs: No Transportation Needs (03/13/2023)   PRAPARE - Administrator, Civil Service (Medical): No    Lack of Transportation (Non-Medical): No  Physical Activity: Sufficiently Active (03/13/2023)   Exercise Vital Sign    Days of Exercise per Week: 2 days    Minutes of Exercise per Session: 90 min  Stress: No Stress Concern Present (03/13/2023)   [employer] of Occupational Health - Occupational Stress Questionnaire    Feeling of Stress : Not at all  Social Connections: Moderately Integrated (03/13/2023)   Social Connection and Isolation Panel [NHANES]    Frequency of Communication with Friends and Family: More than three times a week    Frequency of Social Gatherings with Friends and Family: More than three times a week    Attends Religious Services: More than 4 times per year    Active Member of [employer] or Organizations: Yes    Attends Banker Meetings: More than 4 times per year    Marital Status: Widowed   Allergies  Allergen Reactions   Latex Rash   Family History  Problem Relation Age of Onset   Angina Father    Heart attack Father    Multiple myeloma Brother    Uterine cancer Maternal Aunt    Heart disease Other        Parent   Breast cancer Neg Hx      Current Outpatient Medications (Cardiovascular):    amLODipine  (NORVASC ) 5 MG tablet, Take 1 tablet (5 mg total) by mouth daily.   metoprolol  tartrate (LOPRESSOR ) 25 MG tablet, Take 0.5 tablets (12.5 mg total) by mouth 2 (two) times daily.   nitroGLYCERIN  (NITROSTAT ) 0.4 MG SL tablet, Place 1 tablet (0.4 mg total) under the tongue every 5 (five) minutes x 3 doses as needed for chest pain.  Current Outpatient Medications (Respiratory):    fluticasone (FLONASE  SENSIMIST) 27.5 MCG/SPRAY nasal spray, Place 2 sprays into the nose  daily.   loratadine  (CLARITIN ) 10 MG tablet, Take 1 tablet (10 mg total) by mouth daily.  Current Outpatient Medications (Analgesics):    aspirin  EC 81 MG tablet, Take 1 tablet (81 mg total) by mouth daily. Swallow whole.   Current Outpatient Medications (Other):    Calcium Citrate-Vitamin D3 1000-400 LIQD, Take 1 tablet by mouth daily. Reported on 02/04/2016   clobetasol  cream (TEMOVATE ) 0.05 %, APPLY TOPICALLY TO THE AFFECTED AREA(S) TWICE DAILY FOR 2 WEEKS,  THEN JUST ON WEEK DAYS AS NEEDED   Crisaborole  (EUCRISA ) 2 % OINT, Apply to affected skin once a day   famotidine  (PEPCID ) 20 MG tablet, TAKE 1 TABLET(20 MG) BY MOUTH TWICE DAILY BEFORE A MEAL (Patient taking differently: TAKE 1 TABLET(20 MG) BY MOUTH TWICE DAILY BEFORE A MEAL as needed)   Garlic 100 MG TABS, Take 1 tablet by mouth daily.    ketotifen (ZADITOR) 0.025 % ophthalmic solution, As needed   mometasone (ELOCON) 0.1 % lotion, Apply topically daily.   Reviewed prior external information including notes and imaging from  primary care provider As well as notes that were available from care everywhere and other healthcare systems.  Past medical history, social, surgical and family history all reviewed in electronic medical record.  No pertanent information unless stated regarding to the chief complaint.   Review of Systems:  No headache, visual changes, nausea, vomiting, diarrhea, constipation, dizziness, abdominal pain, skin rash, fevers, chills, night sweats, weight loss, swollen lymph nodes, body aches, joint swelling, chest pain, shortness of breath, mood changes. POSITIVE muscle aches  Objective  Blood pressure 138/82, pulse 61, height 5\' 7"  (1.702 m), weight 184 lb (83.5 kg), SpO2 98%.   General: No apparent distress alert and oriented x3 mood and affect normal, dressed appropriately.  HEENT: Pupils equal, extraocular movements intact  Respiratory: Patient's speak in full sentences and does not appear short of breath   Cardiovascular: No lower extremity edema, non tender, no erythema  Left knee exam shows arthritic changes noted.  Trace effusion noted.  Patient does have an antalgic gait noted.  After informed written and verbal consent, patient was seated on exam table. Left knee was prepped with alcohol swab and utilizing anterolateral approach, patient's left knee space was injected with 4:1  marcaine 0.5%: Kenalog  40mg /dL. Patient tolerated the procedure well without immediate complications.    Impression and Recommendations:    The above documentation has been reviewed and is accurate and complete Lleyton Byers M Corliss Coggeshall, DO

## 2024-02-06 DIAGNOSIS — R0602 Shortness of breath: Secondary | ICD-10-CM | POA: Diagnosis not present

## 2024-02-06 DIAGNOSIS — E7849 Other hyperlipidemia: Secondary | ICD-10-CM | POA: Diagnosis not present

## 2024-02-06 DIAGNOSIS — I10 Essential (primary) hypertension: Secondary | ICD-10-CM | POA: Diagnosis not present

## 2024-02-06 DIAGNOSIS — R002 Palpitations: Secondary | ICD-10-CM | POA: Diagnosis not present

## 2024-02-06 DIAGNOSIS — N1831 Chronic kidney disease, stage 3a: Secondary | ICD-10-CM | POA: Diagnosis not present

## 2024-02-06 DIAGNOSIS — R0789 Other chest pain: Secondary | ICD-10-CM | POA: Diagnosis not present

## 2024-02-09 ENCOUNTER — Ambulatory Visit: Admitting: Family Medicine

## 2024-02-13 NOTE — Progress Notes (Unsigned)
 [facility] [address] [place] [address] Phone: [phone] Subjective:   [name], am serving as a scribe for Dr. Arthea Claudene.  I'm seeing this patient by the request  of:  No primary care provider on file.  CC: left knee pain   YEP:Dlagzrupcz  01/10/2024 Arthritic changes noted.  Discussed icing regimen and home exercises, continuing to monitor closely.  Working on weight loss and doing a great job.  Will consider the possibility of other intervention if needed.  Has responded to viscosupplementation previously as well and we will see if she can get that approved before she goes on her CRUISE.  FOLLOW-UP WITH ME AGAIN IN 6 TO 12 WEEKS      Update 02/14/2024 Lindsey Foster is a 78 y.o. female coming in with complaint of L knee pain. Patient states on and off again pain. About to go on cruise. Management options.      Past Medical History:  Diagnosis Date   Allergic rhinitis    Anxiety    panic attacks in hot rooms   Arthritis    Blood in stool    Breast cancer (HCC) 2006   radiation - Rt   Chickenpox    CKD (chronic kidney disease)    Colon polyps    Diverticulosis    GERD (gastroesophageal reflux disease)    Heart murmur    Hyperlipidemia    Hypertension    Hypothyroidism    Multiple thyroid  nodules    Osteopenia    Personal history of radiation therapy    Urinary incontinence    UTI (lower urinary tract infection)    Vitamin D  deficiency disease    Past Surgical History:  Procedure Laterality Date   BLADDER SURGERY     BREAST EXCISIONAL BIOPSY Right 2006   positive   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   CESAREAN SECTION  1985   COLONOSCOPY N/A 04/30/2021   Procedure: COLONOSCOPY;  Surgeon: Maryruth Ole DASEN, MD;  Location: [facility] ENDOSCOPY;  Service: Endoscopy;  Laterality: N/A;   COLONOSCOPY WITH PROPOFOL  N/A 09/28/2015   Procedure: COLONOSCOPY WITH PROPOFOL ;  Surgeon: Gladis RAYMOND Mariner, MD;  Location: [facility]  ENDOSCOPY;  Service: Endoscopy;  Laterality: N/A;   DILATION AND CURETTAGE OF UTERUS  2012   ESOPHAGOGASTRODUODENOSCOPY (EGD) WITH PROPOFOL  N/A 08/23/2022   Procedure: ESOPHAGOGASTRODUODENOSCOPY (EGD) WITH PROPOFOL ;  Surgeon: Maryruth Ole DASEN, MD;  Location: [facility] ENDOSCOPY;  Service: Endoscopy;  Laterality: N/A;   Thyroid  nodule ablation  2003   Uterine polyp removal  2009    Social History   Socioeconomic History   Marital status: Widowed    Spouse name: Not on file   Number of children: Not on file   Years of education: Not on file   Highest education level: Not on file  Occupational History   Not on file  Tobacco Use   Smoking status: Former   Smokeless tobacco: Never  Vaping Use   Vaping status: Never Used  Substance and Sexual Activity   Alcohol use: No    Alcohol/week: 0.0 standard drinks of alcohol   Drug use: No   Sexual activity: Not on file  Other Topics Concern   Not on file  Social History Narrative   Not on file   Social Drivers of Health   Financial Resource Strain: Low Risk  (03/13/2023)   Overall Financial Resource Strain (CARDIA)    Difficulty of Paying Living Expenses: Not hard at all  Food Insecurity: No Food Insecurity (03/13/2023)   Hunger Vital Sign    Worried About Running Out of Food in the Last Year: Never true    Ran Out of Food in the Last Year: Never true  Transportation Needs: No Transportation Needs (03/13/2023)   PRAPARE - Administrator, Civil Service (Medical): No    Lack of Transportation (Non-Medical): No  Physical Activity: Sufficiently Active (03/13/2023)   Exercise Vital Sign    Days of Exercise per Week: 2 days    Minutes of Exercise per Session: 90 min  Stress: No Stress Concern Present (03/13/2023)   [employer] of Occupational Health - Occupational Stress Questionnaire    Feeling of Stress : Not at all  Social Connections: Moderately Integrated (03/13/2023)   Social Connection and Isolation Panel    Frequency  of Communication with Friends and Family: More than three times a week    Frequency of Social Gatherings with Friends and Family: More than three times a week    Attends Religious Services: More than 4 times per year    Active Member of [employer] or Organizations: Yes    Attends Banker Meetings: More than 4 times per year    Marital Status: Widowed   Allergies  Allergen Reactions   Latex Rash   Family History  Problem Relation Age of Onset   Angina Father    Heart attack Father    Multiple myeloma Brother    Uterine cancer Maternal Aunt    Heart disease Other        Parent   Breast cancer Neg Hx      Current Outpatient Medications (Cardiovascular):    amLODipine  (NORVASC ) 5 MG tablet, Take 1 tablet (5 mg total) by mouth daily.   metoprolol  tartrate (LOPRESSOR ) 25 MG tablet, Take 0.5 tablets (12.5 mg total) by mouth 2 (two) times daily.   nitroGLYCERIN  (NITROSTAT ) 0.4 MG SL tablet, Place 1 tablet (0.4 mg total) under the tongue every 5 (five) minutes x 3 doses as needed for chest pain.  Current Outpatient Medications (Respiratory):    fluticasone (FLONASE  SENSIMIST) 27.5 MCG/SPRAY nasal spray, Place 2 sprays into the nose daily.   loratadine  (CLARITIN ) 10 MG tablet, Take 1 tablet (10 mg total) by mouth daily.  Current Outpatient Medications (Analgesics):    aspirin  EC 81 MG tablet, Take 1 tablet (81 mg total) by mouth daily. Swallow whole.   Current Outpatient Medications (Other):    Calcium Citrate-Vitamin D3 1000-400 LIQD, Take 1 tablet by mouth daily. Reported on 02/04/2016   clobetasol  cream (TEMOVATE ) 0.05 %, APPLY TOPICALLY TO THE AFFECTED AREA(S) TWICE DAILY FOR 2 WEEKS, THEN JUST ON WEEK DAYS AS NEEDED   Crisaborole  (EUCRISA ) 2 % OINT, Apply to affected skin once a day   famotidine  (PEPCID ) 20 MG tablet, TAKE 1 TABLET(20 MG) BY MOUTH TWICE DAILY BEFORE A MEAL (Patient taking differently: TAKE 1 TABLET(20 MG) BY MOUTH TWICE DAILY BEFORE A MEAL as needed)    Garlic 100 MG TABS, Take 1 tablet by mouth daily.    ketotifen (ZADITOR) 0.025 % ophthalmic solution, As needed   mometasone (ELOCON) 0.1 % lotion, Apply topically daily.   Reviewed prior external information including notes and imaging from  primary care provider As well as notes that were available from care everywhere and other healthcare systems.  Past medical history, social, surgical and family history all reviewed in electronic medical record.  No pertanent information unless stated regarding to the chief complaint.  Review of Systems:  No headache, visual changes, nausea, vomiting, diarrhea, constipation, dizziness, abdominal pain, skin rash, fevers, chills, night sweats, weight loss, swollen lymph nodes, body aches, joint swelling, chest pain, shortness of breath, mood changes. POSITIVE muscle aches  Objective  Blood pressure 126/82, pulse 62, height 5' 7 (1.702 m), weight 187 lb (84.8 kg), SpO2 97%.   General: No apparent distress alert and oriented x3 mood and affect normal, dressed appropriately.  HEENT: Pupils equal, extraocular movements intact  Respiratory: Patient's speak in full sentences and does not appear short of breath  Cardiovascular: No lower extremity edema, non tender, no erythema  Left knee shows varus deformity of the knee noted.  Mild tenderness noted.  Mild crepitus noted.    Impression and Recommendations:     The above documentation has been reviewed and is accurate and complete Sierria Bruney M Lindsey Stensland, DO

## 2024-02-14 ENCOUNTER — Ambulatory Visit: Admitting: Family Medicine

## 2024-02-14 VITALS — BP 126/82 | HR 62 | Ht 67.0 in | Wt 187.0 lb

## 2024-02-14 DIAGNOSIS — M1712 Unilateral primary osteoarthritis, left knee: Secondary | ICD-10-CM | POA: Diagnosis not present

## 2024-02-14 NOTE — Assessment & Plan Note (Signed)
 Held on the other injection.  Does have a varus deformity of the knee noted.  Discussed which activities to do and which ones to avoid.  Patient is still ambulating with the aid of a cane.  Do feel with patient is making improvement from the osteoporosis could potentially continue to increase the chance of having a replacement if needed but hopefully will not be necessary.  Has responded well to viscosupplementation and we will try to get approval.  Discussed icing regimen and home exercises.  Patient felt better with a patella strap and will try wearing that.  Followed up again in 2 months

## 2024-02-14 NOTE — Patient Instructions (Signed)
 Patella strap Be active when you can See you again in 2 months

## 2024-02-15 DIAGNOSIS — H04123 Dry eye syndrome of bilateral lacrimal glands: Secondary | ICD-10-CM | POA: Diagnosis not present

## 2024-02-15 DIAGNOSIS — H2513 Age-related nuclear cataract, bilateral: Secondary | ICD-10-CM | POA: Diagnosis not present

## 2024-02-22 ENCOUNTER — Telehealth: Payer: Self-pay

## 2024-02-22 NOTE — Telephone Encounter (Signed)
 Patient scheduled for 04/16/24  MONOVISC authorized left knee MEDICARE Deductible %257 has met $257 Since the deductible has been met patient responsible for 20% coinsurance  PA NOT REQUIRED Reference # [id number]

## 2024-02-23 NOTE — Telephone Encounter (Signed)
 Noted

## 2024-03-06 ENCOUNTER — Ambulatory Visit: Payer: Self-pay

## 2024-03-06 NOTE — Telephone Encounter (Signed)
 FYI Only or Action Required?: Action required by provider: clinical question for provider.  Patient was last seen in primary care on 11/14/2023 by Lindsey Merle, MD.  Called Nurse Triage reporting Covid Positive, known COVID exposure and tested positive at home.  Symptoms began a week ago.  Interventions attempted: OTC medications: robitussin, cough drops.  Symptoms are: tickle in throat, cough, sneezing, nasal congestion. Headaches and body aches (resolved) gradually improving.  Triage Disposition: Home Care  Patient/caregiver understands and will follow disposition?: Yes                         Copied from CRM #[id number]. Topic: Clinical - Red Word Triage >> Mar 06, 2024 12:25 PM Deleta RAMAN wrote: Red Word that prompted transfer to Nurse Triage: patient tested positive for COVID-19. Patient did travel when coming in contact. She does not have a pcp at [facility] anymore Reason for Disposition  [1] COVID-19 diagnosed by positive lab test (e.g., PCR, rapid self-test kit) AND [2] mild symptoms (e.g., cough, fever, others) AND [3] no complications or SOB  Answer Assessment - Initial Assessment Questions Patient states she returned home yesterday morning from her [place] cruise.  1. COVID-19 DIAGNOSIS: How do you know that you have COVID? (e.g., positive lab test or self-test, diagnosed by doctor or NP/PA, symptoms after exposure).     Positive home test.  2. COVID-19 EXPOSURE: Was there any known exposure to COVID before the symptoms began? CDC Definition of close contact: within 6 feet (2 meters) for a total of 15 minutes or more over a 24-hour period.      Her son tested positive for COVID while on their vacation (cruise).  3. ONSET: When did the COVID-19 symptoms start?      02/28/24 or 02/29/24.  4. WORST SYMPTOM: What is your worst symptom? (e.g., cough, fever, shortness of breath, muscle aches)     Tickle in her throat and cough, stuffy  nose.  5. COUGH: Do you have a cough? If Yes, ask: How bad is the cough?       Yes, mostly productive in the morning. She states it is mild but sometimes can go into a coughing fit.  6. FEVER: Do you have a fever? If Yes, ask: What is your temperature, how was it measured, and when did it start?     No.  7. RESPIRATORY STATUS: Describe your breathing? (e.g., normal; shortness of breath, wheezing, unable to speak)      Fine, she states she can take a deep breath with no issues.  8. BETTER-SAME-WORSE: Are you getting better, staying the same or getting worse compared to yesterday?  If getting worse, ask, In what way?     Better.  9. OTHER SYMPTOMS: Do you have any other symptoms?  (e.g., chills, fatigue, headache, loss of smell or taste, muscle pain, sore throat)     Sneezing, headache and body aches (she states while on the cruise). Patient denies loss of taste or smell, nausea, vomiting, diarrhea.  10. HIGH RISK DISEASE: Do you have any chronic medical problems? (e.g., asthma, heart or lung disease, weak immune system, obesity, etc.)       No.  11. VACCINE: Have you had the COVID-19 vaccine? If Yes, ask: Which one, how many shots, when did you get it?       Yes, 3-4 doses.  12. PREGNANCY: Is there any chance you are pregnant? When was your last menstrual period?  N/A.  13. O2 SATURATION MONITOR:  Do you use an oxygen saturation monitor (pulse oximeter) at home? If Yes, ask What is your reading (oxygen level) today? What is your usual oxygen saturation reading? (e.g., 95%)       Not able to use the machine, she thinks the battery is dead.  Protocols used: Coronavirus (COVID-19) Diagnosed or Suspected-A-AH

## 2024-03-06 NOTE — Telephone Encounter (Signed)
 Pt is aware and gave a verbal understanding.

## 2024-03-19 ENCOUNTER — Ambulatory Visit (INDEPENDENT_AMBULATORY_CARE_PROVIDER_SITE_OTHER): Payer: Medicare Other | Admitting: *Deleted

## 2024-03-19 VITALS — Ht 67.0 in | Wt 184.0 lb

## 2024-03-19 DIAGNOSIS — Z0000 Encounter for general adult medical examination without abnormal findings: Secondary | ICD-10-CM | POA: Diagnosis not present

## 2024-03-19 NOTE — Patient Instructions (Signed)
 Lindsey Foster , Thank you for taking time out of your busy schedule to complete your Annual Wellness Visit with me. I enjoyed our conversation and look forward to speaking with you again next year. I, as well as your care team,  appreciate your ongoing commitment to your health goals. Please review the following plan we discussed and let me know if I can assist you in the future. Your Game plan/ To Do List    Referrals: If you haven't heard from the office you've been referred to, please reach out to them at the phone provided.  Remember to update your flu and covid vaccines annually. Consider updating your Tetanus (Tdap) and shingles vaccines at your pharmacy.  Follow up Visits: We will see or speak with you next year for your Next Medicare AWV with our clinical staff8/7/26 @ 10:50 Have you seen your provider in the last 6 months (3 months if uncontrolled diabetes)? Yes  Clinician Recommendations:  Aim for 30 minutes of exercise or brisk walking, 6-8 glasses of water, and 5 servings of fruits and vegetables each day.       This is a list of the screenings recommended for you:  Health Maintenance  Topic Date Due   DTaP/Tdap/Td vaccine (1 - Tdap) Never done   Zoster (Shingles) Vaccine (1 of 2) Never done   COVID-19 Vaccine (5 - 2024-25 season) 04/16/2023   Flu Shot  03/15/2024   Pneumococcal Vaccine for age over 44 (1 of 2 - PCV) 10/09/2024*   Medicare Annual Wellness Visit  03/19/2025   Colon Cancer Screening  04/30/2026   DEXA scan (bone density measurement)  Completed   Hepatitis B Vaccine  Aged Out   HPV Vaccine  Aged Out   Meningitis B Vaccine  Aged Out  *Topic was postponed. The date shown is not the original due date.    Advanced directives: (ACP Link)Information on Advanced Care Planning can be found at [place]  Secretary of [facility] Advance Health Care Directives Advance Health Care Directives. [email or web address]  Advance Care Planning is important because it:  [x]  Makes sure you  receive the medical care that is consistent with your values, goals, and preferences  [x]  It provides guidance to your family and loved ones and reduces their decisional burden about whether or not they are making the right decisions based on your wishes.  Follow the link provided in your after visit summary or read over the paperwork we have mailed to you to help you started getting your Advance Directives in place. If you need assistance in completing these, please reach out to us  so that we can help you!

## 2024-03-19 NOTE — Progress Notes (Signed)
 Subjective:   Lindsey Foster is a 78 y.o. who presents for a Medicare Wellness preventive visit.  As a reminder, Annual Wellness Visits don't include a physical exam, and some assessments may be limited, especially if this visit is performed virtually. We may recommend an in-person follow-up visit with your provider if needed.  Visit Complete: Virtual I connected with  Lindsey Foster on 03/19/24 by a audio enabled telemedicine application and verified that I am speaking with the correct person using two identifiers.  Patient Location: Home  Provider Location: Home Office  I discussed the limitations of evaluation and management by telemedicine. The patient expressed understanding and agreed to proceed.  Vital Signs: Because this visit was a virtual/telehealth visit, some criteria may be missing or patient reported. Any vitals not documented were not able to be obtained and vitals that have been documented are patient reported.  VideoDeclined- This patient declined Librarian, academic. Therefore the visit was completed with audio only.  Persons Participating in Visit: Patient.  AWV Questionnaire: No: Patient Medicare AWV questionnaire was not completed prior to this visit.  Cardiac Risk Factors include: advanced age (>62men, >25 women);dyslipidemia;hypertension     Objective:    Today's Vitals   03/19/24 1133  Weight: 184 lb (83.5 kg)  Height: 5' 7 (1.702 m)   Body mass index is 28.82 kg/m.     03/19/2024   11:49 AM 10/15/2023   12:14 AM 08/08/2023    1:34 PM 03/13/2023   12:30 PM 08/23/2022    7:43 AM 03/11/2022   12:58 PM 04/30/2021    8:34 AM  Advanced Directives  Does Patient Have a Medical Advance Directive? Yes No No Yes Yes No No  Type of Estate agent of [place];Living will    Living will    Copy of Healthcare Power of Attorney in Chart? No - copy requested        Would patient like information on  creating a medical advance directive?  No - Patient declined No - Patient declined   No - Patient declined No - Patient declined    Current Medications (verified) Outpatient Encounter Medications as of 03/19/2024  Medication Sig   amLODipine  (NORVASC ) 5 MG tablet Take 1 tablet (5 mg total) by mouth daily.   aspirin  EC 81 MG tablet Take 1 tablet (81 mg total) by mouth daily. Swallow whole.   Calcium Citrate-Vitamin D3 1000-400 LIQD Take 1 tablet by mouth daily. Reported on 02/04/2016   clobetasol  cream (TEMOVATE ) 0.05 % APPLY TOPICALLY TO THE AFFECTED AREA(S) TWICE DAILY FOR 2 WEEKS, THEN JUST ON WEEK DAYS AS NEEDED   Crisaborole  (EUCRISA ) 2 % OINT Apply to affected skin once a day   famotidine  (PEPCID ) 20 MG tablet TAKE 1 TABLET(20 MG) BY MOUTH TWICE DAILY BEFORE A MEAL (Patient taking differently: TAKE 1 TABLET(20 MG) BY MOUTH TWICE DAILY BEFORE A MEAL as needed)   fluticasone (FLONASE  SENSIMIST) 27.5 MCG/SPRAY nasal spray Place 2 sprays into the nose daily.   Garlic 100 MG TABS Take 1 tablet by mouth daily.    ketotifen (ZADITOR) 0.025 % ophthalmic solution As needed   loratadine  (CLARITIN ) 10 MG tablet Take 1 tablet (10 mg total) by mouth daily.   metoprolol  tartrate (LOPRESSOR ) 25 MG tablet Take 0.5 tablets (12.5 mg total) by mouth 2 (two) times daily. (Patient taking differently: Take 12.5 mg by mouth 2 (two) times daily. Takes 1/2 once a day)   mometasone (ELOCON) 0.1 % lotion  Apply topically daily.   nitroGLYCERIN  (NITROSTAT ) 0.4 MG SL tablet Place 1 tablet (0.4 mg total) under the tongue every 5 (five) minutes x 3 doses as needed for chest pain.   No facility-administered encounter medications on file as of 03/19/2024.    Allergies (verified) Latex   History: Past Medical History:  Diagnosis Date   Allergic rhinitis    Anxiety    panic attacks in hot rooms   Arthritis    Blood in stool    Breast cancer (HCC) 2006   radiation - Rt   Chickenpox    CKD (chronic kidney disease)     Colon polyps    Diverticulosis    GERD (gastroesophageal reflux disease)    Heart murmur    Hyperlipidemia    Hypertension    Hypothyroidism    Multiple thyroid  nodules    Osteopenia    Personal history of radiation therapy    Urinary incontinence    UTI (lower urinary tract infection)    Vitamin D  deficiency disease    Past Surgical History:  Procedure Laterality Date   BLADDER SURGERY     BREAST EXCISIONAL BIOPSY Right 2006   positive   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   CESAREAN SECTION  1985   COLONOSCOPY N/A 04/30/2021   Procedure: COLONOSCOPY;  Surgeon: Maryruth Ole DASEN, MD;  Location: [facility] ENDOSCOPY;  Service: Endoscopy;  Laterality: N/A;   COLONOSCOPY WITH PROPOFOL  N/A 09/28/2015   Procedure: COLONOSCOPY WITH PROPOFOL ;  Surgeon: Gladis RAYMOND Mariner, MD;  Location: [facility] ENDOSCOPY;  Service: Endoscopy;  Laterality: N/A;   DILATION AND CURETTAGE OF UTERUS  2012   ESOPHAGOGASTRODUODENOSCOPY (EGD) WITH PROPOFOL  N/A 08/23/2022   Procedure: ESOPHAGOGASTRODUODENOSCOPY (EGD) WITH PROPOFOL ;  Surgeon: Maryruth Ole DASEN, MD;  Location: [facility] ENDOSCOPY;  Service: Endoscopy;  Laterality: N/A;   Thyroid  nodule ablation  2003   Uterine polyp removal  2009    Family History  Problem Relation Age of Onset   Angina Father    Heart attack Father    Multiple myeloma Brother    Uterine cancer Maternal Aunt    Heart disease Other        Parent   Breast cancer Neg Hx    Social History   Socioeconomic History   Marital status: Widowed    Spouse name: Not on file   Number of children: Not on file   Years of education: Not on file   Highest education level: Not on file  Occupational History   Not on file  Tobacco Use   Smoking status: Former   Smokeless tobacco: Never  Vaping Use   Vaping status: Never Used  Substance and Sexual Activity   Alcohol use: No    Alcohol/week: 0.0 standard drinks of alcohol   Drug use: No   Sexual activity: Not on file  Other Topics Concern    Not on file  Social History Narrative   Not on file   Social Drivers of Health   Financial Resource Strain: Low Risk  (03/19/2024)   Overall Financial Resource Strain (CARDIA)    Difficulty of Paying Living Expenses: Not hard at all  Food Insecurity: No Food Insecurity (03/19/2024)   Hunger Vital Sign    Worried About Running Out of Food in the Last Year: Never true    Ran Out of Food in the Last Year: Never true  Transportation Needs: No Transportation Needs (03/19/2024)   PRAPARE - Administrator, Civil Service (Medical): No  Lack of Transportation (Non-Medical): No  Physical Activity: Inactive (03/19/2024)   Exercise Vital Sign    Days of Exercise per Week: 0 days    Minutes of Exercise per Session: 0 min  Stress: No Stress Concern Present (03/19/2024)   [employer] of Occupational Health - Occupational Stress Questionnaire    Feeling of Stress: Not at all  Social Connections: Moderately Integrated (03/19/2024)   Social Connection and Isolation Panel    Frequency of Communication with Friends and Family: More than three times a week    Frequency of Social Gatherings with Friends and Family: More than three times a week    Attends Religious Services: 1 to 4 times per year    Active Member of [employer] or Organizations: Yes    Attends Banker Meetings: More than 4 times per year    Marital Status: Widowed    Tobacco Counseling Counseling given: Not Answered    Clinical Intake:  Pre-visit preparation completed: Yes  Pain : No/denies pain     BMI - recorded: 28.82 Nutritional Status: BMI 25 -29 Overweight Nutritional Risks: None Diabetes: No  Lab Results  Component Value Date   HGBA1C 6.4 11/06/2020   HGBA1C 6.2 05/12/2016     How often do you need to have someone help you when you read instructions, pamphlets, or other written materials from your doctor or pharmacy?: 1 - Never  Interpreter Needed?: No  Information entered by :: R. Bora Bost  LPN   Activities of Daily Living     03/19/2024   11:36 AM  In your present state of health, do you have any difficulty performing the following activities:  Hearing? 1  Vision? 0  Difficulty concentrating or making decisions? 0  Walking or climbing stairs? 1  Dressing or bathing? 0  Doing errands, shopping? 0  Preparing Food and eating ? N  Using the Toilet? N  In the past six months, have you accidently leaked urine? N  Do you have problems with loss of bowel control? N  Managing your Medications? N  Managing your Finances? N  Housekeeping or managing your Housekeeping? N    Patient Care Team: Babara Call, MD as Consulting Physician (Oncology)  I have updated your Care Teams any recent Medical Services you may have received from other providers in the past year.     Assessment:   This is a routine wellness examination for [place].  Hearing/Vision screen Hearing Screening - Comments:: Some issues, no aids Vision Screening - Comments:: glasses   Goals Addressed             This Visit's Progress    Patient Stated       Wants to stay active and eat well       Depression Screen     03/19/2024   11:43 AM 11/14/2023   10:52 AM 10/10/2023   10:17 AM 08/10/2023   11:55 AM 04/04/2023   10:20 AM 03/13/2023   12:24 PM 08/30/2022   10:45 AM  PHQ 2/9 Scores  PHQ - 2 Score 0 0 0 0 0 0 0  PHQ- 9 Score 1 2 0 2 0 0     Fall Risk     03/19/2024   11:39 AM 11/14/2023   10:51 AM 10/10/2023   10:17 AM 08/10/2023   11:55 AM 04/04/2023   10:20 AM  Fall Risk   Falls in the past year? 0 0 0 0 0  Number falls in past yr: 0 0  0 0 0  Injury with Fall? 0 0 0 0 0  Risk for fall due to : No Fall Risks Impaired mobility No Fall Risks No Fall Risks No Fall Risks  Follow up Falls evaluation completed;Falls prevention discussed Falls evaluation completed;Education provided Falls evaluation completed;Education provided Falls evaluation completed Falls evaluation completed    MEDICARE RISK AT  HOME:  Medicare Risk at Home Any stairs in or around the home?: Yes If so, are there any without handrails?: No Home free of loose throw rugs in walkways, pet beds, electrical cords, etc?: Yes Adequate lighting in your home to reduce risk of falls?: Yes Life alert?: No Use of a cane, walker or w/c?: Yes Grab bars in the bathroom?: No Shower chair or bench in shower?: No Elevated toilet seat or a handicapped toilet?: No  TIMED UP AND GO:  Was the test performed?  No  Cognitive Function: 6CIT completed    02/08/2018    9:35 AM 02/07/2017    9:58 AM  MMSE - Mini Mental State Exam  Orientation to time 5 5   Orientation to Place 5 5   Registration 3 3   Attention/ Calculation 5 5   Recall 3 3   Language- name 2 objects 2 2   Language- repeat 1 1  Language- follow 3 step command 3 3   Language- read & follow direction 1 1   Write a sentence 1 1   Copy design 1 1   Total score 30 30      Data saved with a previous flowsheet row definition        03/19/2024   11:50 AM 03/13/2023   12:33 PM 02/14/2020    9:53 AM 02/13/2019    9:48 AM  6CIT Screen  What Year? 0 points 0 points 0 points 0 points  What month? 0 points 0 points 0 points 0 points  What time? 0 points 0 points  0 points  Count back from 20 0 points 0 points  0 points  Months in reverse 0 points 0 points 0 points 0 points  Repeat phrase 0 points 0 points 0 points   Total Score 0 points 0 points      Immunizations Immunization History  Administered Date(s) Administered   Fluad Quad(high Dose 65+) 07/17/2019, 07/27/2020, 05/17/2022   Fluad Trivalent(High Dose 65+) 07/04/2023   PFIZER Comirnaty ETTERGray Top)Covid-19 Tri-Sucrose Vaccine 11/09/2020   PFIZER(Purple Top)SARS-COV-2 Vaccination 09/26/2019, 10/17/2019   Pfizer Covid-19 Vaccine Bivalent Booster 39yrs & up 06/15/2021    Screening Tests Health Maintenance  Topic Date Due   DTaP/Tdap/Td (1 - Tdap) Never done   Zoster Vaccines- Shingrix (1 of 2) Never done    COVID-19 Vaccine (5 - 2024-25 season) 04/16/2023   Medicare Annual Wellness (AWV)  03/12/2024   INFLUENZA VACCINE  03/15/2024   Pneumococcal Vaccine: 50+ Years (1 of 2 - PCV) 10/09/2024 (Originally 11/17/1964)   Colonoscopy  04/30/2026   DEXA SCAN  Completed   Hepatitis B Vaccines  Aged Out   HPV VACCINES  Aged Out   Meningococcal B Vaccine  Aged Out    Health Maintenance  Health Maintenance Due  Topic Date Due   DTaP/Tdap/Td (1 - Tdap) Never done   Zoster Vaccines- Shingrix (1 of 2) Never done   COVID-19 Vaccine (5 - 2024-25 season) 04/16/2023   Medicare Annual Wellness (AWV)  03/12/2024   INFLUENZA VACCINE  03/15/2024   Health Maintenance Items Addressed: Patient reminded to update flu and covid vaccines  annually. Patient declines other vaccines at tis time.  Additional Screening:  Vision Screening: Recommended annual ophthalmology exams for early detection of glaucoma and other disorders of the eye.  Up to date  [facility] Would you like a referral to an eye doctor? No    Dental Screening: Recommended annual dental exams for proper oral hygiene  Community Resource Referral / Chronic Care Management: CRR required this visit?  No   CCM required this visit?  No   Plan:    I have personally reviewed and noted the following in the patient's chart:   Medical and social history Use of alcohol, tobacco or illicit drugs  Current medications and supplements including opioid prescriptions. Patient is not currently taking opioid prescriptions. Functional ability and status Nutritional status Physical activity Advanced directives List of other physicians Hospitalizations, surgeries, and ER visits in previous 12 months Vitals Screenings to include cognitive, depression, and falls Referrals and appointments  In addition, I have reviewed and discussed with patient certain preventive protocols, quality metrics, and best practice recommendations. A written personalized care  plan for preventive services as well as general preventive health recommendations were provided to patient.   Angeline Fredericks, LPN   08/18/7972   After Visit Summary: (MyChart) Due to this being a telephonic visit, the after visit summary with patients personalized plan was offered to patient via MyChart   Notes: Nothing significant to report at this time.

## 2024-03-27 ENCOUNTER — Telehealth: Payer: Self-pay

## 2024-03-27 ENCOUNTER — Other Ambulatory Visit: Payer: Self-pay

## 2024-03-27 DIAGNOSIS — K219 Gastro-esophageal reflux disease without esophagitis: Secondary | ICD-10-CM

## 2024-03-27 MED ORDER — FAMOTIDINE 20 MG PO TABS: ORAL_TABLET | ORAL | 1 refills | Status: AC

## 2024-03-27 NOTE — Telephone Encounter (Unsigned)
 Copied from CRM [id number]. Topic: Clinical - Medication Refill >> Mar 27, 2024  9:39 AM Charolett L wrote: Medication: famotidine  (PEPCID ) 20 MG tablet  Has the patient contacted their pharmacy? Yes (Agent: If no, request that the patient contact the pharmacy for the refill. If patient does not wish to contact the pharmacy document the reason why and proceed with request.) (Agent: If yes, when and what did the pharmacy advise?)  This is the patient's preferred pharmacy:  [id number] [place], [place] - [address] AT [facility] OF [address] [place] [address] [place] [address] Phone: [phone] Fax: [phone]  Is this the correct pharmacy for this prescription? Yes If no, delete pharmacy and type the correct one.   Has the prescription been filled recently? Yes  Is the patient out of the medication? Yes  Has the patient been seen for an appointment in the last year OR does the patient have an upcoming appointment? Yes  Can we respond through MyChart? Yes  Agent: Please be advised that Rx refills may take up to 3 business days. We ask that you follow-up with your pharmacy.

## 2024-03-27 NOTE — Telephone Encounter (Signed)
 Refill sent to pharmacy.

## 2024-03-27 NOTE — Telephone Encounter (Unsigned)
 Copied from CRM [id number]. Topic: Clinical - Medication Refill >> Mar 27, 2024  9:39 AM Charolett L wrote: Medication: famotidine  (PEPCID ) 20 MG tablet  Has the patient contacted their pharmacy? Yes (Agent: If no, request that the patient contact the pharmacy for the refill. If patient does not wish to contact the pharmacy document the reason why and proceed with request.) (Agent: If yes, when and what did the pharmacy advise?)  This is the patient's preferred pharmacy:  [id number] [place], [place] - [address] AT [facility] OF [address] [place] [address] [place] [address] Phone: [phone] Fax: [phone]  Is this the correct pharmacy for this prescription? Yes If no, delete pharmacy and type the correct one.   Has the prescription been filled recently? Yes  Is the patient out of the medication? Yes  Has the patient been seen for an appointment in the last year OR does the patient have an upcoming appointment? Yes  Can we respond through MyChart? Yes  Agent: Please be advised that Rx refills may take up to 3 business days. We ask that you follow-up with your pharmacy.

## 2024-03-27 NOTE — Telephone Encounter (Signed)
 Copied from CRM [id number]. Topic: Clinical - Medication Refill >> Mar 27, 2024  9:39 AM Charolett L wrote: Medication: famotidine  (PEPCID ) 20 MG tablet  Has the patient contacted their pharmacy? Yes (Agent: If no, request that the patient contact the pharmacy for the refill. If patient does not wish to contact the pharmacy document the reason why and proceed with request.) (Agent: If yes, when and what did the pharmacy advise?)  This is the patient's preferred pharmacy:  [id number] [place], [place] - [address] AT [facility] OF [address] [place] [address] [place] [address] Phone: [phone] Fax: [phone]  Is this the correct pharmacy for this prescription? Yes If no, delete pharmacy and type the correct one.   Has the prescription been filled recently? Yes  Is the patient out of the medication? Yes  Has the patient been seen for an appointment in the last year OR does the patient have an upcoming appointment? Yes  Can we respond through MyChart? Yes  Agent: Please be advised that Rx refills may take up to 3 business days. We ask that you follow-up with your pharmacy.

## 2024-04-09 ENCOUNTER — Ambulatory Visit: Payer: Medicare Other | Admitting: Family Medicine

## 2024-04-11 NOTE — Progress Notes (Signed)
 Lindsey Foster [address] [place] [address] Phone: [phone] Subjective:   [name], am serving as a scribe for Dr. Arthea Claudene.  I'm seeing this patient by the request  of:  No primary care provider on file.  CC: Left knee pain  YEP:Dlagzrupcz  02/14/2024 Held on the other injection.  Does have a varus deformity of the knee noted.  Discussed which activities to do and which ones to avoid.  Patient is still ambulating with the aid of a cane.  Do feel with patient is making improvement from the osteoporosis could potentially continue to increase the chance of having a replacement if needed but hopefully will not be necessary.  Has responded well to viscosupplementation and we will try to get approval.  Discussed icing regimen and home exercises.  Patient felt better with a patella strap and will try wearing that.  Followed up again in 2 months      Update 04/16/2024 Lindsey Foster is a 78 y.o. female coming in with complaint of L knee pain.  Patient given a patella strap last time with it being more of the patellofemoral.  Has had steroid injections in the knee previously.  Patient states topical cream with arnica has been helping along with support stockings. Wanted to know if double patella band would be helpful.       Past Medical History:  Diagnosis Date   Allergic rhinitis    Anxiety    panic attacks in hot rooms   Arthritis    Blood in stool    Breast cancer (HCC) 2006   radiation - Rt   Chickenpox    CKD (chronic kidney disease)    Colon polyps    Diverticulosis    GERD (gastroesophageal reflux disease)    Heart murmur    Hyperlipidemia    Hypertension    Hypothyroidism    Multiple thyroid  nodules    Osteopenia    Personal history of radiation therapy    Urinary incontinence    UTI (lower urinary tract infection)    Vitamin D  deficiency disease    Past Surgical History:  Procedure Laterality Date   BLADDER  SURGERY     BREAST EXCISIONAL BIOPSY Right 2006   positive   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   CESAREAN SECTION  1985   COLONOSCOPY N/A 04/30/2021   Procedure: COLONOSCOPY;  Surgeon: Maryruth Ole DASEN, MD;  Location: [facility] ENDOSCOPY;  Service: Endoscopy;  Laterality: N/A;   COLONOSCOPY WITH PROPOFOL  N/A 09/28/2015   Procedure: COLONOSCOPY WITH PROPOFOL ;  Surgeon: Gladis RAYMOND Mariner, MD;  Location: [facility] ENDOSCOPY;  Service: Endoscopy;  Laterality: N/A;   DILATION AND CURETTAGE OF UTERUS  2012   ESOPHAGOGASTRODUODENOSCOPY (EGD) WITH PROPOFOL  N/A 08/23/2022   Procedure: ESOPHAGOGASTRODUODENOSCOPY (EGD) WITH PROPOFOL ;  Surgeon: Maryruth Ole DASEN, MD;  Location: [facility] ENDOSCOPY;  Service: Endoscopy;  Laterality: N/A;   Thyroid  nodule ablation  2003   Uterine polyp removal  2009    Social History   Socioeconomic History   Marital status: Widowed    Spouse name: Not on file   Number of children: Not on file   Years of education: Not on file   Highest education level: Not on file  Occupational History   Not on file  Tobacco Use   Smoking status: Former   Smokeless tobacco: Never  Vaping Use   Vaping status: Never Used  Substance and Sexual Activity   Alcohol use: No  Alcohol/week: 0.0 standard drinks of alcohol   Drug use: No   Sexual activity: Not on file  Other Topics Concern   Not on file  Social History Narrative   Not on file   Social Drivers of Health   Financial Resource Strain: Low Risk  (03/19/2024)   Overall Financial Resource Strain (CARDIA)    Difficulty of Paying Living Expenses: Not hard at all  Food Insecurity: No Food Insecurity (03/19/2024)   Hunger Vital Sign    Worried About Running Out of Food in the Last Year: Never true    Ran Out of Food in the Last Year: Never true  Transportation Needs: No Transportation Needs (03/19/2024)   PRAPARE - Administrator, Civil Service (Medical): No    Lack of Transportation (Non-Medical): No  Physical Activity:  Inactive (03/19/2024)   Exercise Vital Sign    Days of Exercise per Week: 0 days    Minutes of Exercise per Session: 0 min  Stress: No Stress Concern Present (03/19/2024)   [employer] of Occupational Health - Occupational Stress Questionnaire    Feeling of Stress: Not at all  Social Connections: Moderately Integrated (03/19/2024)   Social Connection and Isolation Panel    Frequency of Communication with Friends and Family: More than three times a week    Frequency of Social Gatherings with Friends and Family: More than three times a week    Attends Religious Services: 1 to 4 times per year    Active Member of [employer] or Organizations: Yes    Attends Banker Meetings: More than 4 times per year    Marital Status: Widowed   Allergies  Allergen Reactions   Latex Rash   Family History  Problem Relation Age of Onset   Angina Father    Heart attack Father    Multiple myeloma Brother    Uterine cancer Maternal Aunt    Heart disease Other        Parent   Breast cancer Neg Hx      Current Outpatient Medications (Cardiovascular):    amLODipine  (NORVASC ) 5 MG tablet, Take 1 tablet (5 mg total) by mouth daily.   metoprolol  tartrate (LOPRESSOR ) 25 MG tablet, Take 0.5 tablets (12.5 mg total) by mouth 2 (two) times daily. (Patient taking differently: Take 12.5 mg by mouth 2 (two) times daily. Takes 1/2 once a day)   nitroGLYCERIN  (NITROSTAT ) 0.4 MG SL tablet, Place 1 tablet (0.4 mg total) under the tongue every 5 (five) minutes x 3 doses as needed for chest pain.  Current Outpatient Medications (Respiratory):    fluticasone (FLONASE  SENSIMIST) 27.5 MCG/SPRAY nasal spray, Place 2 sprays into the nose daily.   loratadine  (CLARITIN ) 10 MG tablet, Take 1 tablet (10 mg total) by mouth daily.  Current Outpatient Medications (Analgesics):    aspirin  EC 81 MG tablet, Take 1 tablet (81 mg total) by mouth daily. Swallow whole.   Current Outpatient Medications (Other):    Calcium  Citrate-Vitamin D3 1000-400 LIQD, Take 1 tablet by mouth daily. Reported on 02/04/2016   clobetasol  cream (TEMOVATE ) 0.05 %, APPLY TOPICALLY TO THE AFFECTED AREA(S) TWICE DAILY FOR 2 WEEKS, THEN JUST ON WEEK DAYS AS NEEDED   Crisaborole  (EUCRISA ) 2 % OINT, Apply to affected skin once a day   famotidine  (PEPCID ) 20 MG tablet, TAKE 1 TABLET(20 MG) BY MOUTH TWICE DAILY BEFORE A MEAL   Garlic 100 MG TABS, Take 1 tablet by mouth daily.    ketotifen (ZADITOR) 0.025 %  ophthalmic solution, As needed   mometasone (ELOCON) 0.1 % lotion, Apply topically daily.   Reviewed prior external information including notes and imaging from  primary care provider As well as notes that were available from care everywhere and other healthcare systems.  Past medical history, social, surgical and family history all reviewed in electronic medical record.  No pertanent information unless stated regarding to the chief complaint.   Review of Systems:  No headache, visual changes, nausea, vomiting, diarrhea, constipation, dizziness, abdominal pain, skin rash, fevers, chills, night sweats, weight loss, swollen lymph nodes, body aches, joint swelling, chest pain, shortness of breath, mood changes. POSITIVE muscle aches  Objective  Blood pressure 134/78, pulse 73, height 5' 7 (1.702 m), weight 189 lb (85.7 kg), SpO2 97%.   General: No apparent distress alert and oriented x3 mood and affect normal, dressed appropriately.  HEENT: Pupils equal, extraocular movements intact  Respiratory: Patient's speak in full sentences and does not appear short of breath  Knee exam shows patient does have arthritic changes noted.  Varus deformity of the knee noted.  Instability noted as well. Lacks last 10 degrees of flexion of the knee. Ambulates with the aid of a cane.    Impression and Recommendations:    The above documentation has been reviewed and is accurate and complete Ryen Heitmeyer M Kyonna Frier, DO

## 2024-04-16 ENCOUNTER — Ambulatory Visit (INDEPENDENT_AMBULATORY_CARE_PROVIDER_SITE_OTHER): Admitting: Family Medicine

## 2024-04-16 ENCOUNTER — Encounter: Payer: Self-pay | Admitting: Family Medicine

## 2024-04-16 VITALS — BP 134/78 | HR 73 | Ht 67.0 in | Wt 189.0 lb

## 2024-04-16 DIAGNOSIS — M1712 Unilateral primary osteoarthritis, left knee: Secondary | ICD-10-CM

## 2024-04-16 NOTE — Patient Instructions (Signed)
 I think we still hold on injections You're doing great Keep working in that yard See you again in 10-12 weeks

## 2024-04-16 NOTE — Assessment & Plan Note (Signed)
 Discussed with patient about the possibility of either steroid injection or the possibility of viscosupplementation.  Patient would like to hold at this time.  We have been dealing with his knee now for greater than 3 years.  Has done well.  States that she even saw another provider 9 years ago who said she needed replacement.  Continuing to try to be active continuing to try to lose weight.  Follow-up in 12 weeks

## 2024-04-18 DIAGNOSIS — I10 Essential (primary) hypertension: Secondary | ICD-10-CM | POA: Diagnosis not present

## 2024-04-18 DIAGNOSIS — N1831 Chronic kidney disease, stage 3a: Secondary | ICD-10-CM | POA: Diagnosis not present

## 2024-04-18 DIAGNOSIS — N2581 Secondary hyperparathyroidism of renal origin: Secondary | ICD-10-CM | POA: Diagnosis not present

## 2024-04-22 DIAGNOSIS — N1831 Chronic kidney disease, stage 3a: Secondary | ICD-10-CM | POA: Diagnosis not present

## 2024-04-22 DIAGNOSIS — I10 Essential (primary) hypertension: Secondary | ICD-10-CM | POA: Diagnosis not present

## 2024-04-22 DIAGNOSIS — N2581 Secondary hyperparathyroidism of renal origin: Secondary | ICD-10-CM | POA: Diagnosis not present

## 2024-06-06 DIAGNOSIS — E7849 Other hyperlipidemia: Secondary | ICD-10-CM | POA: Diagnosis not present

## 2024-06-06 DIAGNOSIS — N1831 Chronic kidney disease, stage 3a: Secondary | ICD-10-CM | POA: Diagnosis not present

## 2024-06-06 DIAGNOSIS — R0602 Shortness of breath: Secondary | ICD-10-CM | POA: Diagnosis not present

## 2024-06-06 DIAGNOSIS — Z23 Encounter for immunization: Secondary | ICD-10-CM | POA: Diagnosis not present

## 2024-06-06 DIAGNOSIS — R002 Palpitations: Secondary | ICD-10-CM | POA: Diagnosis not present

## 2024-06-06 DIAGNOSIS — I10 Essential (primary) hypertension: Secondary | ICD-10-CM | POA: Diagnosis not present

## 2024-06-17 ENCOUNTER — Encounter: Payer: Self-pay | Admitting: Radiology

## 2024-06-24 NOTE — Progress Notes (Deleted)
 Lindsey Foster [address] [place] [address] Phone: [phone] Subjective:    I'm seeing this patient by the request  of:  No primary care provider on file.  CC: Left knee pain  YEP:Dlagzrupcz  04/16/2024 Discussed with patient about the possibility of either steroid injection or the possibility of viscosupplementation.  Patient would like to hold at this time.  We have been dealing with his knee now for greater than 3 years.  Has done well.  States that she even saw another provider 9 years ago who said she needed replacement.  Continuing to try to be active continuing to try to lose weight.  Follow-up in 12 weeks      Update 06/25/2024 Lindsey Foster is a 78 y.o. female coming in with complaint of L knee pain. Patient states    Past Medical History:  Diagnosis Date   Allergic rhinitis    Anxiety    panic attacks in hot rooms   Arthritis    Blood in stool    Breast cancer (HCC) 2006   radiation - Rt   Chickenpox    CKD (chronic kidney disease)    Colon polyps    Diverticulosis    GERD (gastroesophageal reflux disease)    Heart murmur    Hyperlipidemia    Hypertension    Hypothyroidism    Multiple thyroid  nodules    Osteopenia    Personal history of radiation therapy    Urinary incontinence    UTI (lower urinary tract infection)    Vitamin D  deficiency disease    Past Surgical History:  Procedure Laterality Date   BLADDER SURGERY     BREAST EXCISIONAL BIOPSY Right 2006   positive   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   CESAREAN SECTION  1985   COLONOSCOPY N/A 04/30/2021   Procedure: COLONOSCOPY;  Surgeon: Maryruth Ole DASEN, MD;  Location: [facility] END[facility];  Service: Endoscopy;  Laterality: N/A;   COLONOSCOPY WITH PROPOFOL  N/A 09/28/2015   Procedure: COLONOSCOPY WITH PROPOFOL ;  Surgeon: Gladis RAYMOND Mariner, MD;  Location: [facility] END[facility]COPY;  Service: Endoscopy;  Laterality: N/A;   DILATION AND CURETTAGE OF UTERUS  2012    ESOPHAGOGASTRODUODENOSCOPY (EGD) WITH PROPOFOL  N/A 08/23/2022   Procedure: ESOPHAGOGASTRODUODENOSCOPY (EGD) WITH PROPOFOL ;  Surgeon: Maryruth Ole DASEN, MD;  Location: [facility] END[facility];  Service: Endoscopy;  Laterality: N/A;   Thyroid  nodule ablation  2003   Uterine polyp removal  2009    Social History   Socioeconomic History   Marital status: Widowed    Spouse name: Not on file   Number of children: Not on file   Years of education: Not on file   Highest education level: Not on file  Occupational History   Not on file  Tobacco Use   Smoking status: Former   Smokeless tobacco: Never  Vaping Use   Vaping status: Never Used  Substance and Sexual Activity   Alcohol use: No    Alcohol/week: 0.0 standard drinks of alcohol   Drug use: No   Sexual activity: Not on file  Other Topics Concern   Not on file  Social History Narrative   Not on file   Social Drivers of Health   Financial Resource Strain: Low Risk  (03/19/2024)   Overall Financial Resource Strain (CARDIA)    Difficulty of Paying Living Expenses: Not hard at all  Food Insecurity: No Food Insecurity (03/19/2024)   Hunger Vital Sign    Worried About Running Out  of Food in the Last Year: Never true    Ran Out of Food in the Last Year: Never true  Transportation Needs: No Transportation Needs (03/19/2024)   PRAPARE - Administrator, Civil Service (Medical): No    Lack of Transportation (Non-Medical): No  Physical Activity: Inactive (03/19/2024)   Exercise Vital Sign    Days of Exercise per Week: 0 days    Minutes of Exercise per Session: 0 min  Stress: No Stress Concern Present (03/19/2024)   [employer] of Occupational Health - Occupational Stress Questionnaire    Feeling of Stress: Not at all  Social Connections: Moderately Integrated (03/19/2024)   Social Connection and Isolation Panel    Frequency of Communication with Friends and Family: More than three times a week    Frequency of Social Gatherings with  Friends and Family: More than three times a week    Attends Religious Services: 1 to 4 times per year    Active Member of [employer] or Organizations: Yes    Attends Banker Meetings: More than 4 times per year    Marital Status: Widowed   Allergies  Allergen Reactions   Latex Rash   Family History  Problem Relation Age of Onset   Angina Father    Heart attack Father    Multiple myeloma Brother    Uterine cancer Maternal Aunt    Heart disease Other        Parent   Breast cancer Neg Hx      Current Outpatient Medications (Cardiovascular):    amLODipine  (NORVASC ) 5 MG tablet, Take 1 tablet (5 mg total) by mouth daily.   metoprolol  tartrate (LOPRESSOR ) 25 MG tablet, Take 0.5 tablets (12.5 mg total) by mouth 2 (two) times daily. (Patient taking differently: Take 12.5 mg by mouth 2 (two) times daily. Takes 1/2 once a day)   nitroGLYCERIN  (NITROSTAT ) 0.4 MG SL tablet, Place 1 tablet (0.4 mg total) under the tongue every 5 (five) minutes x 3 doses as needed for chest pain.  Current Outpatient Medications (Respiratory):    fluticasone (FLONASE  SENSIMIST) 27.5 MCG/SPRAY nasal spray, Place 2 sprays into the nose daily.   loratadine  (CLARITIN ) 10 MG tablet, Take 1 tablet (10 mg total) by mouth daily.  Current Outpatient Medications (Analgesics):    aspirin  EC 81 MG tablet, Take 1 tablet (81 mg total) by mouth daily. Swallow whole.   Current Outpatient Medications (Other):    Calcium Citrate-Vitamin D3 1000-400 LIQD, Take 1 tablet by mouth daily. Reported on 02/04/2016   clobetasol  cream (TEMOVATE ) 0.05 %, APPLY TOPICALLY TO THE AFFECTED AREA(S) TWICE DAILY FOR 2 WEEKS, THEN JUST ON WEEK DAYS AS NEEDED   Crisaborole  (EUCRISA ) 2 % OINT, Apply to affected skin once a day   famotidine  (PEPCID ) 20 MG tablet, TAKE 1 TABLET(20 MG) BY MOUTH TWICE DAILY BEFORE A MEAL   Garlic 100 MG TABS, Take 1 tablet by mouth daily.    ketotifen (ZADITOR) 0.025 % ophthalmic solution, As needed    mometasone (ELOCON) 0.1 % lotion, Apply topically daily.   Reviewed prior external information including notes and imaging from  primary care provider As well as notes that were available from care everywhere and other healthcare systems.  Past medical history, social, surgical and family history all reviewed in electronic medical record.  No pertanent information unless stated regarding to the chief complaint.   Review of Systems:  No headache, visual changes, nausea, vomiting, diarrhea, constipation, dizziness, abdominal pain, skin rash,  fevers, chills, night sweats, weight loss, swollen lymph nodes, body aches, joint swelling, chest pain, shortness of breath, mood changes. POSITIVE muscle aches  Objective  There were no vitals taken for this visit.   General: No apparent distress alert and oriented x3 mood and affect normal, dressed appropriately.  HEENT: Pupils equal, extraocular movements intact  Respiratory: Patient's speak in full sentences and does not appear short of breath  Cardiovascular: No lower extremity edema, non tender, no erythema   Left knee exam shows   Impression and Recommendations:     The above documentation has been reviewed and is accurate and complete Josceline Chenard M Wilman Tucker, DO

## 2024-06-25 ENCOUNTER — Ambulatory Visit: Admitting: Family Medicine

## 2024-06-26 NOTE — Progress Notes (Unsigned)
 [name] [address] [place] [address] Phone: [phone] Subjective:   Lindsey Foster, am serving as a scribe for Dr. Arthea Claudene.  I'm seeing this patient by the request  of:  No primary care provider on file.  CC: Left knee pain  YEP:Dlagzrupcz  04/16/2024 Discussed with patient about the possibility of either steroid injection or the possibility of viscosupplementation.  Patient would like to hold at this time.  We have been dealing with his knee now for greater than 3 years.  Has done well.  States that she even saw another provider 9 years ago who said she needed replacement.  Continuing to try to be active continuing to try to lose weight.  Follow-up in 12 weeks      Update 06/28/2024 Lindsey Foster is a 78 y.o. female coming in with complaint of L knee pain. Patient states that she has L hamstring soreness but the knee is doing ok. Pain also radiates down anterior tibia. Standing up increases her pain.     Past Medical History:  Diagnosis Date   Allergic rhinitis    Anxiety    panic attacks in hot rooms   Arthritis    Blood in stool    Breast cancer (HCC) 2006   radiation - Rt   Chickenpox    CKD (chronic kidney disease)    Colon polyps    Diverticulosis    GERD (gastroesophageal reflux disease)    Heart murmur    Hyperlipidemia    Hypertension    Hypothyroidism    Multiple thyroid  nodules    Osteopenia    Personal history of radiation therapy    Urinary incontinence    UTI (lower urinary tract infection)    Vitamin D  deficiency disease    Past Surgical History:  Procedure Laterality Date   BLADDER SURGERY     BREAST EXCISIONAL BIOPSY Right 2006   positive   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   CESAREAN SECTION  1985   COLONOSCOPY N/A 04/30/2021   Procedure: COLONOSCOPY;  Surgeon: Maryruth Ole DASEN, MD;  Location: [facility] ENDOSCOPY;  Service: Endoscopy;  Laterality: N/A;   COLONOSCOPY WITH PROPOFOL  N/A  09/28/2015   Procedure: COLONOSCOPY WITH PROPOFOL ;  Surgeon: Gladis RAYMOND Mariner, MD;  Location: [facility] ENDOSCOPY;  Service: Endoscopy;  Laterality: N/A;   DILATION AND CURETTAGE OF UTERUS  2012   ESOPHAGOGASTRODUODENOSCOPY (EGD) WITH PROPOFOL  N/A 08/23/2022   Procedure: ESOPHAGOGASTRODUODENOSCOPY (EGD) WITH PROPOFOL ;  Surgeon: Maryruth Ole DASEN, MD;  Location: [facility] ENDOSCOPY;  Service: Endoscopy;  Laterality: N/A;   Thyroid  nodule ablation  2003   Uterine polyp removal  2009    Social History   Socioeconomic History   Marital status: Widowed    Spouse name: Not on file   Number of children: Not on file   Years of education: Not on file   Highest education level: Not on file  Occupational History   Not on file  Tobacco Use   Smoking status: Former   Smokeless tobacco: Never  Vaping Use   Vaping status: Never Used  Substance and Sexual Activity   Alcohol use: No    Alcohol/week: 0.0 standard drinks of alcohol   Drug use: No   Sexual activity: Not on file  Other Topics Concern   Not on file  Social History Narrative   Not on file   Social Drivers of Health   Financial Resource Strain: Low Risk  (03/19/2024)  Overall Financial Resource Strain (CARDIA)    Difficulty of Paying Living Expenses: Not hard at all  Food Insecurity: No Food Insecurity (03/19/2024)   Hunger Vital Sign    Worried About Running Out of Food in the Last Year: Never true    Ran Out of Food in the Last Year: Never true  Transportation Needs: No Transportation Needs (03/19/2024)   PRAPARE - Administrator, Civil Service (Medical): No    Lack of Transportation (Non-Medical): No  Physical Activity: Inactive (03/19/2024)   Exercise Vital Sign    Days of Exercise per Week: 0 days    Minutes of Exercise per Session: 0 min  Stress: No Stress Concern Present (03/19/2024)   [employer] of Occupational Health - Occupational Stress Questionnaire    Feeling of Stress: Not at all  Social Connections:  Moderately Integrated (03/19/2024)   Social Connection and Isolation Panel    Frequency of Communication with Friends and Family: More than three times a week    Frequency of Social Gatherings with Friends and Family: More than three times a week    Attends Religious Services: 1 to 4 times per year    Active Member of [employer] or Organizations: Yes    Attends Banker Meetings: More than 4 times per year    Marital Status: Widowed   Allergies  Allergen Reactions   Latex Rash   Family History  Problem Relation Age of Onset   Angina Father    Heart attack Father    Multiple myeloma Brother    Uterine cancer Maternal Aunt    Heart disease Other        Parent   Breast cancer Neg Hx      Current Outpatient Medications (Cardiovascular):    amLODipine  (NORVASC ) 5 MG tablet, Take 1 tablet (5 mg total) by mouth daily.   metoprolol  tartrate (LOPRESSOR ) 25 MG tablet, Take 0.5 tablets (12.5 mg total) by mouth 2 (two) times daily. (Patient taking differently: Take 12.5 mg by mouth 2 (two) times daily. Takes 1/2 once a day)   nitroGLYCERIN  (NITROSTAT ) 0.4 MG SL tablet, Place 1 tablet (0.4 mg total) under the tongue every 5 (five) minutes x 3 doses as needed for chest pain.  Current Outpatient Medications (Respiratory):    fluticasone (FLONASE  SENSIMIST) 27.5 MCG/SPRAY nasal spray, Place 2 sprays into the nose daily.   loratadine  (CLARITIN ) 10 MG tablet, Take 1 tablet (10 mg total) by mouth daily.  Current Outpatient Medications (Analgesics):    aspirin  EC 81 MG tablet, Take 1 tablet (81 mg total) by mouth daily. Swallow whole.   Current Outpatient Medications (Other):    Calcium Citrate-Vitamin D3 1000-400 LIQD, Take 1 tablet by mouth daily. Reported on 02/04/2016   clobetasol  cream (TEMOVATE ) 0.05 %, APPLY TOPICALLY TO THE AFFECTED AREA(S) TWICE DAILY FOR 2 WEEKS, THEN JUST ON WEEK DAYS AS NEEDED   Crisaborole  (EUCRISA ) 2 % OINT, Apply to affected skin once a day   famotidine   (PEPCID ) 20 MG tablet, TAKE 1 TABLET(20 MG) BY MOUTH TWICE DAILY BEFORE A MEAL   Garlic 100 MG TABS, Take 1 tablet by mouth daily.    ketotifen (ZADITOR) 0.025 % ophthalmic solution, As needed   mometasone (ELOCON) 0.1 % lotion, Apply topically daily.   Reviewed prior external information including notes and imaging from  primary care provider As well as notes that were available from care everywhere and other healthcare systems.  Past medical history, social, surgical and family history  all reviewed in electronic medical record.  No pertanent information unless stated regarding to the chief complaint.   Review of Systems:  No headache, visual changes, nausea, vomiting, diarrhea, constipation, dizziness, abdominal pain, skin rash, fevers, chills, night sweats, weight loss, swollen lymph nodes, body aches, joint swelling, chest pain, shortness of breath, mood changes. POSITIVE muscle aches  Objective     General: No apparent distress alert and oriented x3 mood and affect normal, dressed appropriately.  HEENT: Pupils equal, extraocular movements intact  Respiratory: Patient's speak in full sentences and does not appear short of breath  Cardiovascular: No lower extremity edema, non tender, no erythema   Left knee exam shows arthritic changes noted.  There is deformity noted.  Instability with valgus and varus force.  Patient does have a changes noted also of the right knee.  Crepitus noted.  Varus deformity noted of the lower legs.  After informed written and verbal consent, patient was seated on exam table. Left knee was prepped with alcohol swab and utilizing anterolateral approach, patient's left knee space was injected with 4:1  marcaine 0.5%: Kenalog  40mg /dL. Patient tolerated the procedure well without immediate complications.  After informed written and verbal consent, patient was seated on exam table. Right knee was prepped with alcohol swab and utilizing anterolateral approach,  patient's right knee space was injected with 4:1  marcaine 0.5%: Kenalog  40mg /dL. Patient tolerated the procedure well without immediate complications.    Impression and Recommendations:     The above documentation has been reviewed and is accurate and complete Lindsey Pe M Keo Schirmer, DO

## 2024-06-28 ENCOUNTER — Ambulatory Visit: Admitting: Family Medicine

## 2024-06-28 ENCOUNTER — Ambulatory Visit

## 2024-06-28 ENCOUNTER — Encounter: Payer: Self-pay | Admitting: Family Medicine

## 2024-06-28 VITALS — BP 138/72 | HR 57 | Ht 67.0 in | Wt 189.0 lb

## 2024-06-28 DIAGNOSIS — G8929 Other chronic pain: Secondary | ICD-10-CM | POA: Diagnosis not present

## 2024-06-28 DIAGNOSIS — M170 Bilateral primary osteoarthritis of knee: Secondary | ICD-10-CM

## 2024-06-28 DIAGNOSIS — M25562 Pain in left knee: Secondary | ICD-10-CM

## 2024-06-28 DIAGNOSIS — M25561 Pain in right knee: Secondary | ICD-10-CM

## 2024-06-28 NOTE — Assessment & Plan Note (Signed)
 Bilateral injections given today, tolerated the procedure well, will get x-rays to further evaluate for the likely bone-on-bone arthritic changes.  Increase activity as tolerated.  Could be a candidate for viscosupplementation.  Continue to be active where possible.  Patient still wants to avoid any surgical intervention.  Follow-up with me again in 2 to 3 months

## 2024-06-28 NOTE — Patient Instructions (Addendum)
 Injected both knees today Xray today See me again in 10 weeks

## 2024-06-29 ENCOUNTER — Encounter: Payer: Self-pay | Admitting: Family Medicine

## 2024-07-01 ENCOUNTER — Ambulatory Visit: Payer: Self-pay | Admitting: Family Medicine

## 2024-07-08 ENCOUNTER — Other Ambulatory Visit: Payer: Self-pay | Admitting: Family Medicine

## 2024-07-08 DIAGNOSIS — I10 Essential (primary) hypertension: Secondary | ICD-10-CM

## 2024-07-08 NOTE — Telephone Encounter (Unsigned)
 Copied from CRM [id number]. Topic: Clinical - Medication Refill >> Jul 08, 2024 11:14 AM Viola F wrote: Medication: amLODipine  (NORVASC ) 5 MG tablet [[id number]]  Has the patient contacted their pharmacy? Yes (Agent: If no, request that the patient contact the pharmacy for the refill. If patient does not wish to contact the pharmacy document the reason why and proceed with request.) (Agent: If yes, when and what did the pharmacy advise?)  This is the patient's preferred pharmacy:  [id number] [place], [place] - [address] AT [facility] OF [address] [address] [place] [address] Phone: [phone] Fax: [phone]  Is this the correct pharmacy for this prescription? Yes If no, delete pharmacy and type the correct one.   Has the prescription been filled recently? Yes  Is the patient out of the medication? Yes, patient is out of medication since lastnight   Has the patient been seen for an appointment in the last year OR does the patient have an upcoming appointment? Yes  Can we respond through MyChart? Yes  Agent: Please be advised that Rx refills may take up to 3 business days. We ask that you follow-up with your pharmacy.

## 2024-07-09 MED ORDER — AMLODIPINE BESYLATE 5 MG PO TABS: 5.0000 mg | ORAL_TABLET | Freq: Every day | ORAL | 1 refills | Status: AC

## 2024-07-09 NOTE — Telephone Encounter (Signed)
 Copied from CRM #[id number]. Topic: Clinical - Medication Question >> Jul 09, 2024 12:43 PM Pinkey ORN wrote: Reason for CRM: amLODipine  (NORVASC ) 5 MG tablet >> Jul 09, 2024 12:45 PM Pinkey ORN wrote: Patient called in, states she submitted a refill request for her amLODipine  (NORVASC ) 5 MG tablet. Patient states it still hasn't been sent into the pharmacy and she doesn't want for her blood pressure to go up over the holiday, patient wants to know if there's anyway to expedite the process of her getting her medication? Please follow up with patient.

## 2024-07-09 NOTE — Telephone Encounter (Signed)
 Left message for Lindsey Foster that her Amlodipine  5 mg was refilled today at [facility] in [place].

## 2024-08-06 ENCOUNTER — Ambulatory Visit (INDEPENDENT_AMBULATORY_CARE_PROVIDER_SITE_OTHER)

## 2024-08-06 ENCOUNTER — Telehealth: Payer: Self-pay | Admitting: *Deleted

## 2024-08-06 ENCOUNTER — Other Ambulatory Visit: Payer: Self-pay

## 2024-08-06 DIAGNOSIS — Z1231 Encounter for screening mammogram for malignant neoplasm of breast: Secondary | ICD-10-CM

## 2024-08-06 DIAGNOSIS — Z23 Encounter for immunization: Secondary | ICD-10-CM

## 2024-08-06 DIAGNOSIS — D0511 Intraductal carcinoma in situ of right breast: Secondary | ICD-10-CM

## 2024-08-06 NOTE — Progress Notes (Signed)
 Flu vaccine given.

## 2024-08-06 NOTE — Telephone Encounter (Signed)
 Spoke to Dr. Babara and she is agreeable to ordering mammo. Pt will need to see her after.   Called pt and informed her that mammo orders are placed and she will need to call [facility] to set up appt. App with Dr. Babara set for 2/17 @ 11am.

## 2024-08-06 NOTE — Telephone Encounter (Signed)
 Patient called and stated that she is due for a mammogram. She is not yet established with her new pcp and wanted to know if Dr. Babara could order her mammogram. Her other pcp left the practice.  She has no follow-up with Dr. Babara. 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 Please advise and let pt know if Dr. Babara could order mammo. Thanks.

## 2024-08-21 ENCOUNTER — Ambulatory Visit (INDEPENDENT_AMBULATORY_CARE_PROVIDER_SITE_OTHER): Admitting: Internal Medicine

## 2024-08-21 ENCOUNTER — Encounter: Payer: Self-pay | Admitting: Internal Medicine

## 2024-08-21 VITALS — BP 118/74 | HR 65 | Temp 97.7°F | Ht 67.0 in | Wt 184.6 lb

## 2024-08-21 DIAGNOSIS — I10 Essential (primary) hypertension: Secondary | ICD-10-CM

## 2024-08-21 DIAGNOSIS — N1832 Chronic kidney disease, stage 3b: Secondary | ICD-10-CM | POA: Diagnosis not present

## 2024-08-21 DIAGNOSIS — R011 Cardiac murmur, unspecified: Secondary | ICD-10-CM | POA: Insufficient documentation

## 2024-08-21 DIAGNOSIS — E039 Hypothyroidism, unspecified: Secondary | ICD-10-CM

## 2024-08-21 DIAGNOSIS — E559 Vitamin D deficiency, unspecified: Secondary | ICD-10-CM | POA: Diagnosis not present

## 2024-08-21 DIAGNOSIS — E538 Deficiency of other specified B group vitamins: Secondary | ICD-10-CM

## 2024-08-21 DIAGNOSIS — E785 Hyperlipidemia, unspecified: Secondary | ICD-10-CM | POA: Diagnosis not present

## 2024-08-21 NOTE — Patient Instructions (Signed)
" °  VISIT SUMMARY: Today, you visited the clinic due to routine follow-up for your chronic medical conditions.  We discussed your cardiovascular history, previous fractures, thyroid  condition, and vitamin deficiencies. You are feeling fine today.  YOUR PLAN: -PRIMARY HYPERTENSION: Primary hypertension means high blood pressure. Your blood pressure is well-controlled with your current medication, amlodipine . Please continue taking it as prescribed. We also checked your kidney and liver function as part of a comprehensive metabolic panel.  -CHRONIC KIDNEY DISEASE: Chronic kidney disease means your kidneys are  not working as well as they should. Regular monitoring of your kidney function is necessary due to your hypertension. We checked your kidney function as part of a comprehensive metabolic panel.  -HYPOTHYROIDISM: Hypothyroidism means your thyroid  gland is underactive. Your thyroid  function tests were normal on the last check, but we ordered a thyroid  panel, including free T3 and T4, to ensure everything is still well-controlled  -VITAMIN B12 DEFICIENCY: Vitamin B12 deficiency means you have low levels of vitamin B12, which is important for nerve function and red blood cell production. Your levels are low but not deficient. Regular monitoring is necessary. We checked your vitamin B12 levels.  -VITAMIN D  DEFICIENCY: Vitamin D  deficiency means you have low levels of vitamin D , which is important for bone health. Low levels increase your risk of fractures. Regular monitoring is necessary. We checked your vitamin D  levels.  -GENERAL HEALTH MAINTENANCE: We discussed routine health maintenance, including ensuring your flu vaccination is up to date and advising on COVID-19 precautions.  INSTRUCTIONS: Please follow up with the clinic for your routine lab results and any further recommendations. Continue taking your medications as prescribed and monitor your symptoms. If you experience any new or worsening  symptoms, contact the clinic.                      Contains text generated by Abridge.                                 Contains text generated by Abridge.   "

## 2024-08-21 NOTE — Assessment & Plan Note (Signed)
-   This problem is chronic and stable -Last 2 GFR's in our system were consistent with CKD stage IIIb -Will repeat CMP today -Follow-up with Dr. Marcelino (nephrology as an outpatient) -No further workup at this time

## 2024-08-21 NOTE — Assessment & Plan Note (Signed)
-   This problem is chronic and stable -Will recheck lipid panel today -Patient is currently not on statin -No further workup at this time

## 2024-08-21 NOTE — Assessment & Plan Note (Signed)
-   Patient noted to have a grade 2 out of 6 systolic murmur noted on exam -2D echo done last year is consistent with mild to moderate mitral regurgitation -Patient does follow with cardiology as an outpatient -No further workup at this time

## 2024-08-21 NOTE — Assessment & Plan Note (Signed)
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

## 2024-08-21 NOTE — Assessment & Plan Note (Signed)
-   This problem is chronic and stable -Blood pressure is well-controlled today at 118/74 -Will continue with current medications including amlodipine  5 mg daily as well as metoprolol  12.5 mg once daily - Will check CMP today - No further workup at this time

## 2024-08-21 NOTE — Assessment & Plan Note (Signed)
-   Patient with history of vitamin D  deficiency and hip fracture -Not currently on vitamin D  supplementation -Will recheck vitamin D  levels today -No further workup at this time

## 2024-08-21 NOTE — Assessment & Plan Note (Signed)
-   Patient with history of vitamin B12 deficiency -Not on vitamin B12 supplementation currently -Will recheck vitamin B12 level today -No further workup at this time

## 2024-08-21 NOTE — Progress Notes (Signed)
 "  Acute Office Visit  Subjective:     Patient ID: Lindsey Foster, female    DOB: 1946-03-18, 79 y.o.   MRN: [id number]  Chief Complaint  Patient presents with   Medical Management of Chronic Issues    Discussed the use of AI scribe software for clinical note transcription with the patient, who gave verbal consent to proceed.  History of Present Illness Lindsey Foster is an 79 year old female who presents for routine follow-up of her chronic medical conditions.  Musculoskeletal symptoms - Muscle aches present, particularly in the shoulder and back of the legs. - Shoulder pain prevents lifting of the arm. - Sensation in the back of the legs described as if she has been running a marathon. - Symptoms have become more noticeable after a busy holiday season. - Associates onset of muscle aches with initiation of metoprolol . - Patient states that the symptoms have currently improved.  Cardiovascular history and symptoms - In March 2025, experienced persistent elevated pulse after yard work, leading to ER visit. - Stress test performed; cardiac etiology ruled out. - Metoprolol  prescribed for pulse control and panic attacks, with improvement in symptoms. - Takes amlodipine  regularly for blood pressure management. - Uses baby aspirin  and garlic as part of her regimen. - Has experienced issues with prescription refills, particularly with amlodipine .  Fracture history - Nondisplaced hip fracture in January 2025, managed non-surgically and allowed to heal in place. - Low vitamin D  levels monitored to prevent fractures.  Thyroid  history - Thyroid  nodule ablated in 2007. - Thyroid  function monitored with TSH, free T3, and free T4 due to subclinical condition. - Not on any thyroid  supplementation currently  Vitamin deficiencies - Low vitamin D  levels monitored. - Vitamin B12 levels have been slightly low in the past.  General well-being - Feels fine today. - Unsure of any  specific problems at this time. - Uses Claritin  as needed.    Review of Systems  Constitutional: Negative.   HENT: Negative.    Cardiovascular: Negative.   Gastrointestinal: Negative.   Musculoskeletal:  Positive for myalgias.  Neurological: Negative.   Psychiatric/Behavioral: Negative.          Objective:    BP 118/74   Pulse 65   Temp 97.7 F (36.5 C)   Ht 5' 7 (1.702 m)   Wt 184 lb 9.6 oz (83.7 kg)   SpO2 98%   BMI 28.91 kg/m    Physical Exam Constitutional:      Appearance: Normal appearance.  HENT:     Head: Normocephalic and atraumatic.  Cardiovascular:     Rate and Rhythm: Normal rate and regular rhythm.     Heart sounds: Murmur heard.     Comments: Grade 2 out of 6 systolic murmur noted on exam Pulmonary:     Effort: Pulmonary effort is normal.     Breath sounds: Normal breath sounds. No wheezing, rhonchi or rales.  Abdominal:     General: Bowel sounds are normal. There is no distension.     Palpations: Abdomen is soft.     Tenderness: There is no abdominal tenderness. There is no guarding or rebound.  Musculoskeletal:        General: No swelling or tenderness.     Right lower leg: No edema.     Left lower leg: No edema.  Neurological:     Mental Status: She is alert.  Psychiatric:        Mood and Affect: Mood normal.  Behavior: Behavior normal.     No results found for any visits on 08/21/24.      Assessment & Plan:   Problem List Items Addressed This Visit       Cardiovascular and Mediastinum   HTN (hypertension)   - This problem is chronic and stable -Blood pressure is well-controlled today at 118/74 -Will continue with current medications including amlodipine  5 mg daily as well as metoprolol  12.5 mg once daily - Will check CMP today - No further workup at this time      Relevant Orders   Comprehensive metabolic panel with GFR     Endocrine   Hypothyroidism - Primary   - Patient has a history of subclinical  hyperthyroidism treated with radioactive iodine ablation -Will order thyroid  panel for evaluation including free T3, free T4 as well as TSH -Patient currently not on thyroid  supplementation -Will continue to monitor closely      Relevant Orders   Thyroid  Panel With TSH     Genitourinary   Chronic kidney disease, stage 3b (HCC)   - This problem is chronic and stable -Last 2 GFR's in our system were consistent with CKD stage IIIb -Will repeat CMP today -Follow-up with Dr. Marcelino (nephrology as an outpatient) -No further workup at this time      Relevant Orders   Comprehensive metabolic panel with GFR     Other   Vitamin D  deficiency (Chronic)   - Patient with history of vitamin D  deficiency and hip fracture -Not currently on vitamin D  supplementation -Will recheck vitamin D  levels today -No further workup at this time      Relevant Orders   VITAMIN D  25 Hydroxy (Vit-D Deficiency, Fractures)   HLD (hyperlipidemia)   - This problem is chronic and stable -Will recheck lipid panel today -Patient is currently not on statin -No further workup at this time      Relevant Orders   Lipid panel   Murmur, cardiac   - Patient noted to have a grade 2 out of 6 systolic murmur noted on exam -2D echo done last year is consistent with mild to moderate mitral regurgitation -Patient does follow with cardiology as an outpatient -No further workup at this time      Vitamin B 12 deficiency   - Patient with history of vitamin B12 deficiency -Not on vitamin B12 supplementation currently -Will recheck vitamin B12 level today -No further workup at this time      Relevant Orders   B12    No orders of the defined types were placed in this encounter.   No follow-ups on file.  Sabriyah Wilcher, MD   "

## 2024-08-22 LAB — LIPID PANEL
Cholesterol: 222 mg/dL — ABNORMAL HIGH (ref 28–200)
HDL: 71.9 mg/dL
LDL Cholesterol: 135 mg/dL — ABNORMAL HIGH (ref 10–99)
NonHDL: 149.66
Total CHOL/HDL Ratio: 3
Triglycerides: 74 mg/dL (ref 10.0–149.0)
VLDL: 14.8 mg/dL (ref 0.0–40.0)

## 2024-08-22 LAB — COMPREHENSIVE METABOLIC PANEL WITH GFR
ALT: 14 U/L (ref 3–35)
AST: 16 U/L (ref 5–37)
Albumin: 4.3 g/dL (ref 3.5–5.2)
Alkaline Phosphatase: 53 U/L (ref 39–117)
BUN: 17 mg/dL (ref 6–23)
CO2: 28 meq/L (ref 19–32)
Calcium: 9.6 mg/dL (ref 8.4–10.5)
Chloride: 104 meq/L (ref 96–112)
Creatinine, Ser: 1.17 mg/dL (ref 0.40–1.20)
GFR: 43.99 mL/min — ABNORMAL LOW
Glucose, Bld: 76 mg/dL (ref 70–99)
Potassium: 3.9 meq/L (ref 3.5–5.1)
Sodium: 139 meq/L (ref 135–145)
Total Bilirubin: 0.5 mg/dL (ref 0.2–1.2)
Total Protein: 7 g/dL (ref 6.0–8.3)

## 2024-08-22 LAB — THYROID PANEL WITH TSH
Free Thyroxine Index: 2.1 (ref 1.4–3.8)
T3 Uptake: 30 % (ref 22–35)
T4, Total: 6.9 ug/dL (ref 5.1–11.9)
TSH: 1.46 m[IU]/L (ref 0.40–4.50)

## 2024-08-22 LAB — VITAMIN B12: Vitamin B-12: 264 pg/mL (ref 211–911)

## 2024-08-22 LAB — VITAMIN D 25 HYDROXY (VIT D DEFICIENCY, FRACTURES): VITD: 29.13 ng/mL — ABNORMAL LOW (ref 30.00–100.00)

## 2024-08-26 ENCOUNTER — Ambulatory Visit: Payer: Self-pay | Admitting: Internal Medicine

## 2024-08-26 DIAGNOSIS — E538 Deficiency of other specified B group vitamins: Secondary | ICD-10-CM

## 2024-08-26 DIAGNOSIS — E559 Vitamin D deficiency, unspecified: Secondary | ICD-10-CM

## 2024-08-26 MED ORDER — VITAMIN B-12 1000 MCG PO TABS: 1000.0000 ug | ORAL_TABLET | Freq: Every day | ORAL | 3 refills | Status: AC

## 2024-08-26 MED ORDER — VITAMIN D3 50 MCG (2000 UT) PO CAPS: 2000.0000 [IU] | ORAL_CAPSULE | Freq: Every day | ORAL | 3 refills | Status: AC

## 2024-09-03 NOTE — Progress Notes (Unsigned)
 " [name] [address] [place] [address] Phone: [phone] Subjective:   Lindsey Foster, am serving as a scribe for Dr. Arthea Claudene.  I'm seeing this patient by the request  of:  No primary care provider on file.  CC: Bilateral knee pain  YEP:Dlagzrupcz  06/28/2024 Bilateral injections given today, tolerated the procedure well, will get x-rays to further evaluate for the likely bone-on-bone arthritic changes.  Increase activity as tolerated.  Could be a candidate for viscosupplementation.  Continue to be active where possible.  Patient still wants to avoid any surgical intervention.  Follow-up with me again in 2 to 3 months     Update 09/05/2024 Lindsey Foster is a 79 y.o. female coming in with complaint of B knee pain. Patient states that she has had good and bad days. Pain increases with level of activity. Takes Tylenol  at night and using a topical cream which helps her get going in the morning.    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 Did see his primary care that did show the patient did have a low vitamin D  at 40.  Was to start supplementation likely chronic kidney disease and minorly better.  Low B12 also noted today.  Past Medical History:  Diagnosis Date   Allergic rhinitis    Anxiety    panic attacks in hot rooms   Arthritis    Blood in stool    Breast cancer (HCC) 2006   radiation - Rt   Chickenpox    CKD (chronic kidney disease)    Colon polyps    Diverticulosis    GERD (gastroesophageal reflux disease)    Heart murmur    Hyperlipidemia    Hypertension    Hypothyroidism    Multiple thyroid  nodules    Osteopenia    Personal history of radiation therapy    Urinary incontinence    UTI (lower urinary tract infection)    Vitamin D  deficiency disease    Past Surgical History:  Procedure Laterality Date   BLADDER SURGERY     BREAST EXCISIONAL BIOPSY Right 2006   positive   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   CESAREAN SECTION  1985   COLONOSCOPY N/A 04/30/2021   Procedure: COLONOSCOPY;  Surgeon: Maryruth Ole DASEN, MD;  Location: [facility] ENDOSCOPY;  Service: Endoscopy;  Laterality: N/A;   COLONOSCOPY WITH PROPOFOL  N/A 09/28/2015   Procedure: COLONOSCOPY WITH PROPOFOL ;  Surgeon: Gladis RAYMOND Mariner, MD;  Location: [facility] ENDOSCOPY;  Service: Endoscopy;  Laterality: N/A;   DILATION AND CURETTAGE OF UTERUS  2012   ESOPHAGOGASTRODUODENOSCOPY (EGD) WITH PROPOFOL  N/A 08/23/2022   Procedure: ESOPHAGOGASTRODUODENOSCOPY (EGD) WITH PROPOFOL ;  Surgeon: Maryruth Ole DASEN, MD;  Location: [facility] ENDOSCOPY;  Service: Endoscopy;  Laterality: N/A;   Thyroid  nodule ablation  2003   Uterine polyp removal  2009    Social History   Socioeconomic History   Marital status: Widowed    Spouse name: Not on file   Number of children: Not on file   Years of education: Not on file   Highest education level: Not on file  Occupational History   Not on file  Tobacco Use   Smoking status: Former   Smokeless tobacco: Never  Vaping Use   Vaping status: Never Used  Substance and Sexual Activity   Alcohol use: No    Alcohol/week: 0.0 standard drinks of alcohol   Drug use: No   Sexual activity: Not on  file  Other Topics Concern   Not on file  Social History Narrative   Not on file   Social Drivers of Health   Tobacco Use: Medium Risk (08/21/2024)   Patient History    Smoking Tobacco Use: Former    Smokeless Tobacco Use: Never    Passive Exposure: Not on file  Financial Resource Strain: Low Risk (03/19/2024)   Overall Financial Resource Strain (CARDIA)    Difficulty of Paying Living Expenses: Not hard at all  Food Insecurity: No Food Insecurity (03/19/2024)   Epic    Worried About Radiation Protection Practitioner of Food in the Last Year: Never true    Ran Out of Food in the Last Year: Never true  Transportation Needs: No Transportation Needs (03/19/2024)   Epic    Lack of Transportation (Medical): No    Lack of  Transportation (Non-Medical): No  Physical Activity: Inactive (03/19/2024)   Exercise Vital Sign    Days of Exercise per Week: 0 days    Minutes of Exercise per Session: 0 min  Stress: No Stress Concern Present (03/19/2024)   [employer] of Occupational Health - Occupational Stress Questionnaire    Feeling of Stress: Not at all  Social Connections: Moderately Integrated (03/19/2024)   Social Connection and Isolation Panel    Frequency of Communication with Friends and Family: More than three times a week    Frequency of Social Gatherings with Friends and Family: More than three times a week    Attends Religious Services: 1 to 4 times per year    Active Member of [employer] or Organizations: Yes    Attends Banker Meetings: More than 4 times per year    Marital Status: Widowed  Depression (PHQ2-9): Low Risk (08/21/2024)   Depression (PHQ2-9)    PHQ-2 Score: 2  Alcohol Screen: Low Risk (03/19/2024)   Alcohol Screen    Last Alcohol Screening Score (AUDIT): 0  Housing: Unknown (03/19/2024)   Epic    Unable to Pay for Housing in the Last Year: No    Number of Times Moved in the Last Year: Not on file    Homeless in the Last Year: No  Utilities: Not At Risk (03/19/2024)   Epic    Threatened with loss of utilities: No  Health Literacy: Adequate Health Literacy (03/19/2024)   B1300 Health Literacy    Frequency of need for help with medical instructions: Never   Allergies[1] Family History  Problem Relation Age of Onset   Angina Father    Heart attack Father    Multiple myeloma Brother    Uterine cancer Maternal Aunt    Heart disease Other        Parent   Breast cancer Neg Hx     Current Outpatient Medications (Cardiovascular):    amLODipine  (NORVASC ) 5 MG tablet, Take 1 tablet (5 mg total) by mouth daily.   metoprolol  tartrate (LOPRESSOR ) 25 MG tablet, Take 0.5 tablets (12.5 mg total) by mouth 2 (two) times daily. (Patient taking differently: Take 12.5 mg by mouth 2 (two) times  daily. Takes 1/2 once a day)   nitroGLYCERIN  (NITROSTAT ) 0.4 MG SL tablet, Place 1 tablet (0.4 mg total) under the tongue every 5 (five) minutes x 3 doses as needed for chest pain.  Current Outpatient Medications (Respiratory):    fluticasone (FLONASE  SENSIMIST) 27.5 MCG/SPRAY nasal spray, Place 2 sprays into the nose daily.   loratadine  (CLARITIN ) 10 MG tablet, Take 1 tablet (10 mg total) by mouth daily.  Current  Outpatient Medications (Analgesics):    aspirin  EC 81 MG tablet, Take 1 tablet (81 mg total) by mouth daily. Swallow whole.  Current Outpatient Medications (Hematological):    cyanocobalamin  (VITAMIN B12) 1000 MCG tablet, Take 1 tablet (1,000 mcg total) by mouth daily.  Current Outpatient Medications (Other):    Calcium Citrate-Vitamin D3 1000-400 LIQD, Take 1 tablet by mouth daily. Reported on 02/04/2016   Cholecalciferol (VITAMIN D3) 50 MCG (2000 UT) capsule, Take 1 capsule (2,000 Units total) by mouth daily.   clobetasol  cream (TEMOVATE ) 0.05 %, APPLY TOPICALLY TO THE AFFECTED AREA(S) TWICE DAILY FOR 2 WEEKS, THEN JUST ON WEEK DAYS AS NEEDED   Crisaborole  (EUCRISA ) 2 % OINT, Apply to affected skin once a day   famotidine  (PEPCID ) 20 MG tablet, TAKE 1 TABLET(20 MG) BY MOUTH TWICE DAILY BEFORE A MEAL   Garlic 100 MG TABS, Take 1 tablet by mouth daily.    ketotifen (ZADITOR) 0.025 % ophthalmic solution, As needed   mometasone (ELOCON) 0.1 % lotion, Apply topically daily.   Reviewed prior external information including notes and imaging from  primary care provider As well as notes that were available from care everywhere and other healthcare systems.  Past medical history, social, surgical and family history all reviewed in electronic medical record.  No pertanent information unless stated regarding to the chief complaint.   Review of Systems:  No headache, visual changes, nausea, vomiting, diarrhea, constipation, dizziness, abdominal pain, skin rash, fevers, chills, night  sweats, weight loss, swollen lymph nodes, body aches, chest pain, shortness of breath, mood changes. POSITIVE muscle aches, joint swelling  Objective  Blood pressure 118/70, pulse (!) 51, height 5' 7 (1.702 m), SpO2 97%.   General: No apparent distress alert and oriented x3 mood and affect normal, dressed appropriately.  HEENT: Pupils equal, extraocular movements intact  Respiratory: Patient's speak in full sentences and does not appear short of breath  Cardiovascular: Trace lower extremity edema, non tender, no erythema  Bilateral knees show valgus deformity noted bilaterally.  Instability with valgus and varus force.  More tenderness over the medial joint space.  After informed written and verbal consent, patient was seated on exam table. Right knee was prepped with alcohol swab and utilizing anterolateral approach, patient's right knee space was injected with 4:1  marcaine 0.5%: Kenalog  40mg /dL. Patient tolerated the procedure well without immediate complications.  After informed written and verbal consent, patient was seated on exam table. Left knee was prepped with alcohol swab and utilizing anterolateral approach, patient's left knee space was injected with 4:1  marcaine 0.5%: Kenalog  40mg /dL. Patient tolerated the procedure well without immediate complications.   Impression and Recommendations:    The above documentation has been reviewed and is accurate and complete Jabria Loos M Dillie Burandt, DO        [1]  Allergies Allergen Reactions   Latex Rash   "

## 2024-09-05 ENCOUNTER — Ambulatory Visit: Admitting: Family Medicine

## 2024-09-05 ENCOUNTER — Encounter: Payer: Self-pay | Admitting: Family Medicine

## 2024-09-05 VITALS — BP 118/70 | HR 51 | Ht 67.0 in

## 2024-09-05 DIAGNOSIS — M170 Bilateral primary osteoarthritis of knee: Secondary | ICD-10-CM | POA: Diagnosis not present

## 2024-09-05 NOTE — Assessment & Plan Note (Signed)
 Chronic problem with exacerbation.  Patient is likely starting to ambulate more since patient's pelvic fractures.  He is seeming to do much better.  Discussed icing regimen and home exercises, discussed which activities to do and which ones to avoid.  Increase activity slowly.  Discussed icing regimen.  Follow-up again in 10 weeks.

## 2024-09-05 NOTE — Patient Instructions (Signed)
Injected both knees today See me again in 10 weeks 

## 2024-09-23 ENCOUNTER — Encounter

## 2024-10-01 ENCOUNTER — Inpatient Hospital Stay: Admitting: Oncology

## 2024-10-04 ENCOUNTER — Encounter

## 2024-11-27 ENCOUNTER — Ambulatory Visit: Admitting: Family Medicine

## 2025-03-21 ENCOUNTER — Ambulatory Visit
